# Patient Record
Sex: Male | Born: 1945 | Race: White | Hispanic: No | Marital: Married | Smoking: Former smoker
Health system: Southern US, Community
[De-identification: ages and names within clinical notes are randomized; demographics above are authoritative.]

## PROBLEM LIST (undated history)

## (undated) DIAGNOSIS — I809 Phlebitis and thrombophlebitis of unspecified site: Secondary | ICD-10-CM

## (undated) DIAGNOSIS — H25019 Cortical age-related cataract, unspecified eye: Secondary | ICD-10-CM

## (undated) DIAGNOSIS — L409 Psoriasis, unspecified: Secondary | ICD-10-CM

## (undated) DIAGNOSIS — R066 Hiccough: Secondary | ICD-10-CM

## (undated) DIAGNOSIS — C329 Malignant neoplasm of larynx, unspecified: Secondary | ICD-10-CM

## (undated) DIAGNOSIS — K649 Unspecified hemorrhoids: Secondary | ICD-10-CM

## (undated) DIAGNOSIS — K635 Polyp of colon: Secondary | ICD-10-CM

## (undated) DIAGNOSIS — C3411 Malignant neoplasm of upper lobe, right bronchus or lung: Secondary | ICD-10-CM

## (undated) HISTORY — DX: Psoriasis, unspecified: L40.9

## (undated) HISTORY — DX: Cortical age-related cataract, unspecified eye: H25.019

## (undated) HISTORY — DX: Hiccough: R06.6

## (undated) HISTORY — PX: EYE SURGERY: SHX253

## (undated) HISTORY — DX: Phlebitis and thrombophlebitis of unspecified site: I80.9

## (undated) HISTORY — DX: Unspecified hemorrhoids: K64.9

## (undated) HISTORY — PX: HERNIA REPAIR: SHX51

## (undated) HISTORY — DX: Polyp of colon: K63.5

## (undated) HISTORY — DX: Malignant neoplasm of larynx, unspecified: C32.9

---

## 1996-03-30 DIAGNOSIS — I809 Phlebitis and thrombophlebitis of unspecified site: Secondary | ICD-10-CM

## 1996-03-30 HISTORY — DX: Phlebitis and thrombophlebitis of unspecified site: I80.9

## 2008-03-30 HISTORY — PX: THROAT SURGERY: SHX803

## 2008-11-27 DIAGNOSIS — C329 Malignant neoplasm of larynx, unspecified: Secondary | ICD-10-CM

## 2008-11-27 HISTORY — DX: Malignant neoplasm of larynx, unspecified: C32.9

## 2008-11-28 ENCOUNTER — Ambulatory Visit: Payer: Self-pay | Admitting: Radiation Oncology

## 2008-11-29 ENCOUNTER — Ambulatory Visit: Payer: Self-pay | Admitting: Otolaryngology

## 2008-12-07 ENCOUNTER — Ambulatory Visit: Payer: Self-pay | Admitting: Radiation Oncology

## 2008-12-11 ENCOUNTER — Ambulatory Visit: Payer: Self-pay | Admitting: Radiation Oncology

## 2008-12-11 IMAGING — PT NM PET TUM IMG INITIAL (PI) SKULL BASE T - THIGH
1 of 5 series · 1 of 25 positions shown · non-contrast
Comparison: none

REASON FOR EXAM: vocal cord CA larynx
COMMENTS:

PROCEDURE:     PET - PET/CT INIT STAG HEAD/NECK CA  - [DATE]  [DATE]
RESULT:
HISTORY: Head and neck cancer.

[Series 3: ct wb 3.0 b30f · axial · 3.0mm · 0.98mm/px · 1 of 494 slices shown]
[im 439/494  brain]
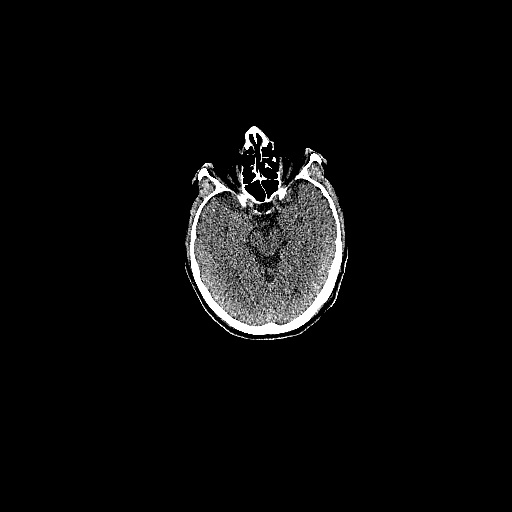

[1 of 25 positions shown; findings below may reference images not displayed]

PROCEDURE AND FINDINGS:  Following determination of fasting blood sugar of
93 mg/dl, 13.3 mCi of F18-FDG was administered to the patient and PET/ CT
obtained with evaluation of the head, neck and body.

No definite focal abnormality identified. Although subtle para-laryngeal
activity is noted, this is nonspecific and no definite PET positive lesions
are noted. No evidence of widespread or metastatic disease noted. It should
be noted that there is mild soft tissue swelling in the laryngeal region
with soft tissue prominence in the anterior commissure. Reference is made to
laryngoscopic evaluation.
IMPRESSION: No definite PET positive lesions are noted.

## 2008-12-11 IMAGING — CT NM PET TUM IMG INITIAL (PI) SKULL BASE T - THIGH
1 of 5 series · 2 of 25 positions shown · non-contrast
Comparison: none

REASON FOR EXAM: vocal cord CA larynx
COMMENTS:

PROCEDURE:     PET - PET/CT INIT STAG HEAD/NECK CA  - [DATE]  [DATE]
RESULT:
HISTORY: Head and neck cancer.

[Series 3: ct headneck 2.0 b31s · axial · 2.0mm · 0.98mm/px · z∈[+81,+117]mm · 2 of 188 slices shown]
[im 117/188  brain]
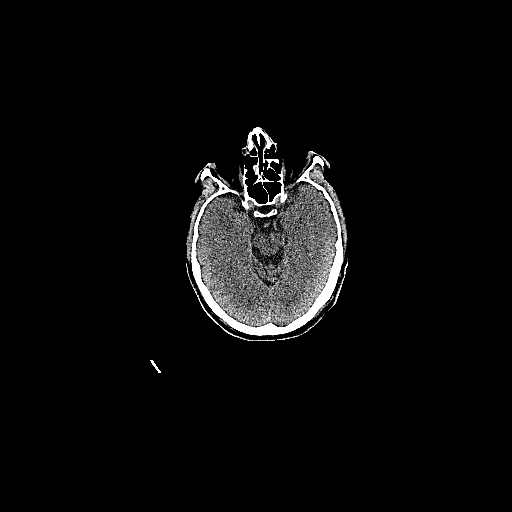
[im 141/188  brain]
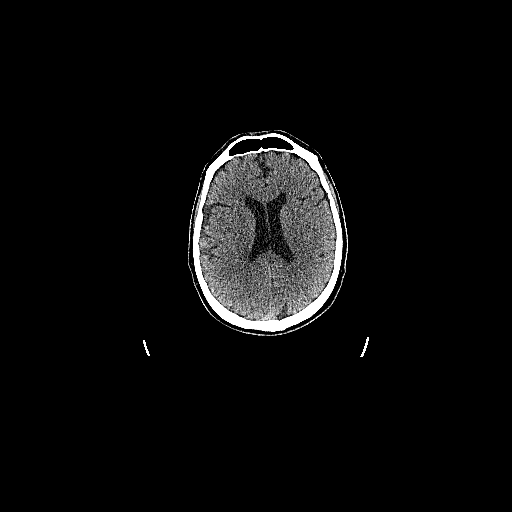

[2 of 25 positions shown; findings below may reference images not displayed]

PROCEDURE AND FINDINGS:  Following determination of fasting blood sugar of
93 mg/dl, 13.3 mCi of F18-FDG was administered to the patient and PET/ CT
obtained with evaluation of the head, neck and body.

No definite focal abnormality identified. Although subtle para-laryngeal
activity is noted, this is nonspecific and no definite PET positive lesions
are noted. No evidence of widespread or metastatic disease noted. It should
be noted that there is mild soft tissue swelling in the laryngeal region
with soft tissue prominence in the anterior commissure. Reference is made to
laryngoscopic evaluation.
IMPRESSION: No definite PET positive lesions are noted.

## 2008-12-13 IMAGING — CT CT GUIDANCE PLACEMENT RAD THERAPY FIELDS
1 series · 16 of 32 positions shown, 20 images · non-contrast
Comparison: none

[Series 2: tx planning · axial · 0.98mm/px · z∈[-150,+152]mm · 16 of 134 slices shown, 20 images]
[im 9/134  soft-tissue]
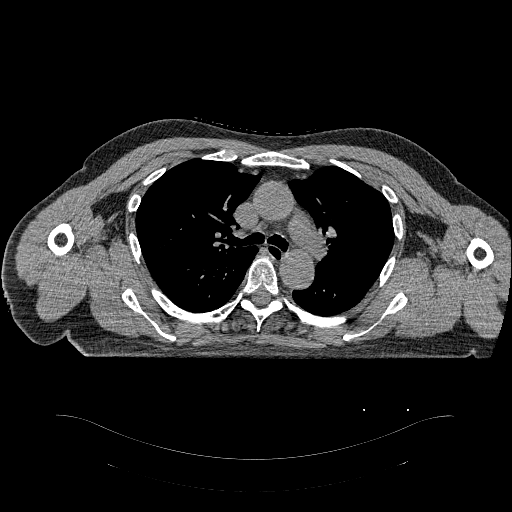
[im 9/134  bone]
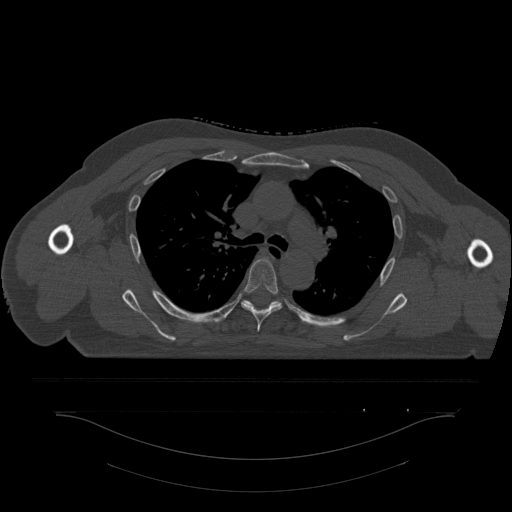
[im 18/134  soft-tissue]
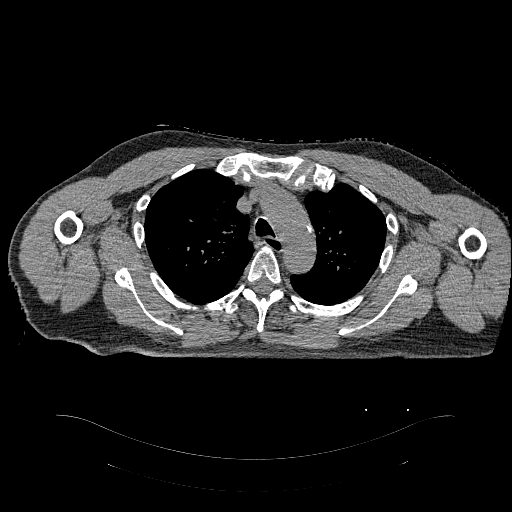
[im 26/134  soft-tissue]
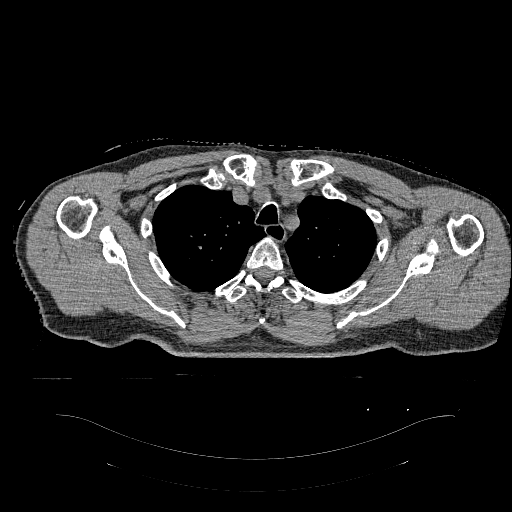
[im 35/134  soft-tissue]
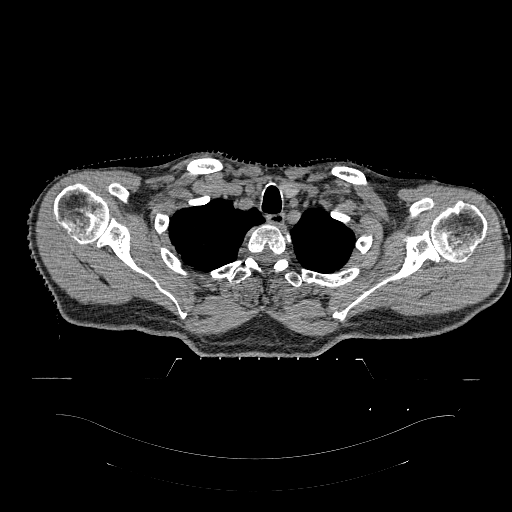
[im 43/134  soft-tissue]
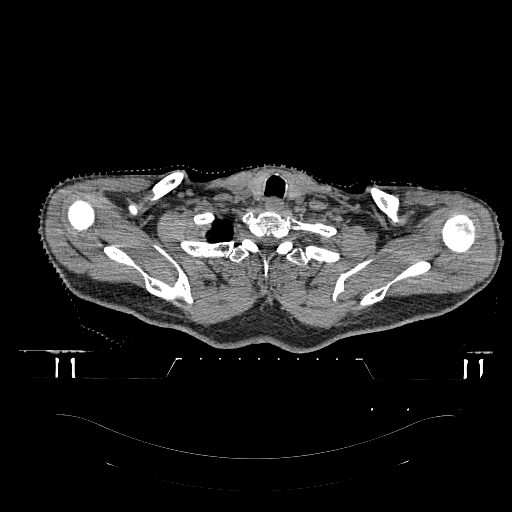
[im 52/134  soft-tissue]
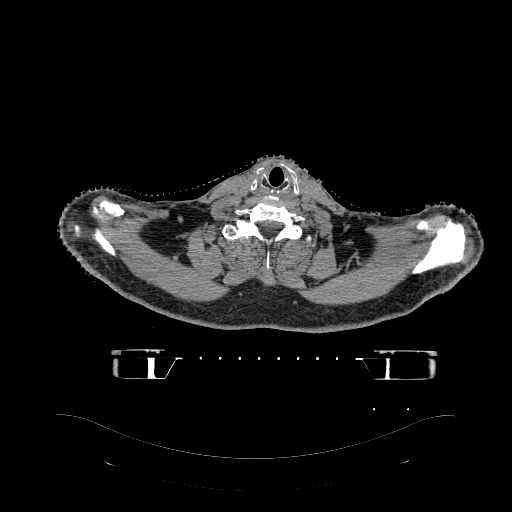
[im 61/134  soft-tissue]
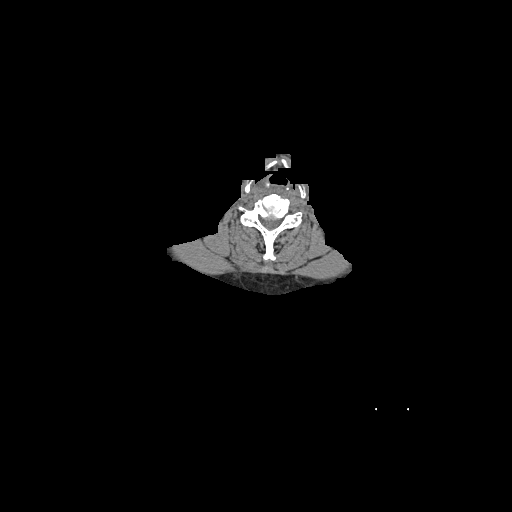
[im 73/134  soft-tissue]
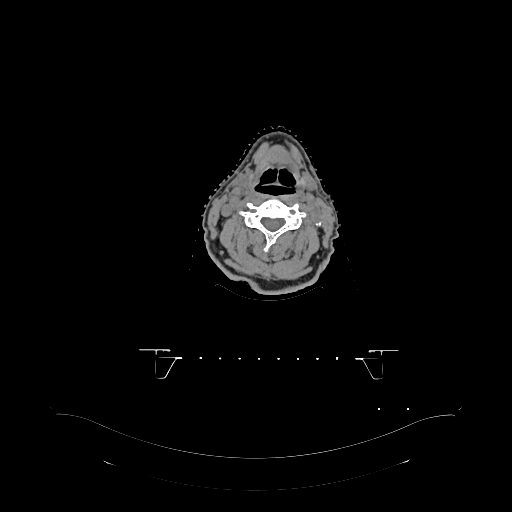
[im 82/134  soft-tissue]
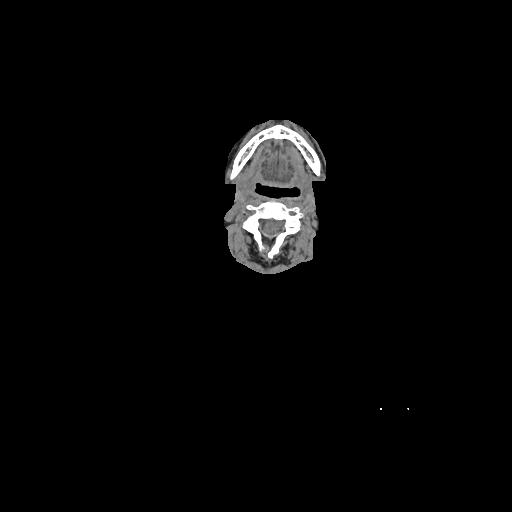
[im 82/134  bone]
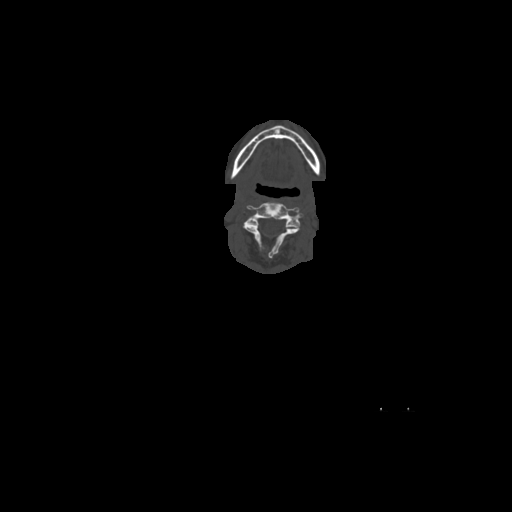
[im 91/134  soft-tissue]
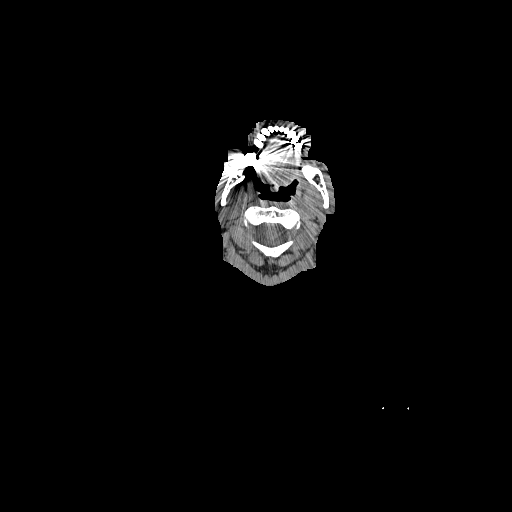
[im 99/134  soft-tissue]
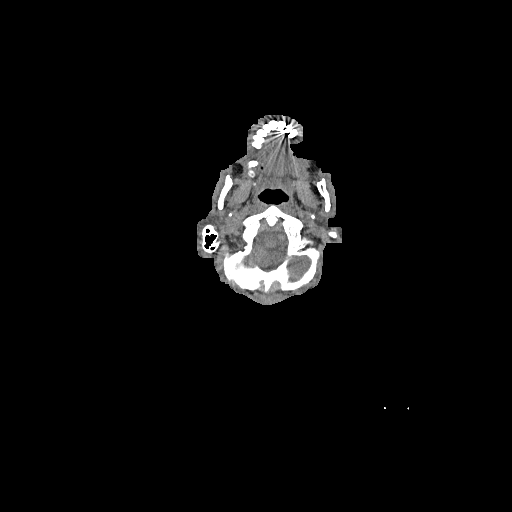
[im 108/134  soft-tissue]
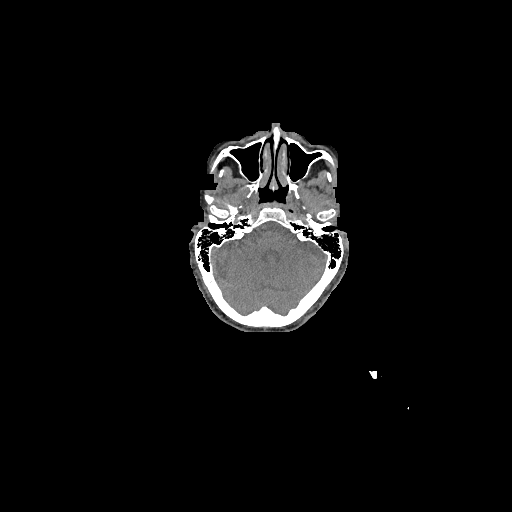
[im 116/134  soft-tissue]
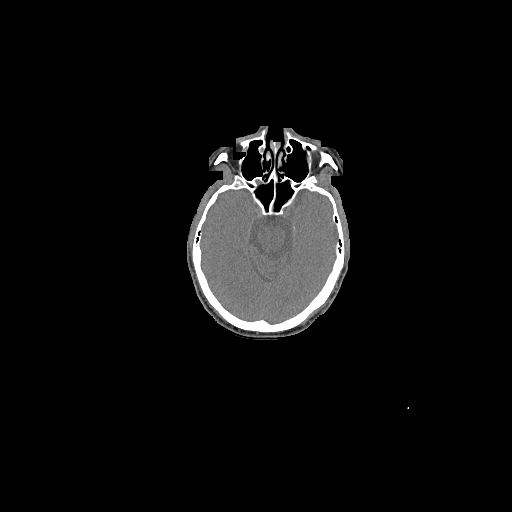
[im 116/134  lung]
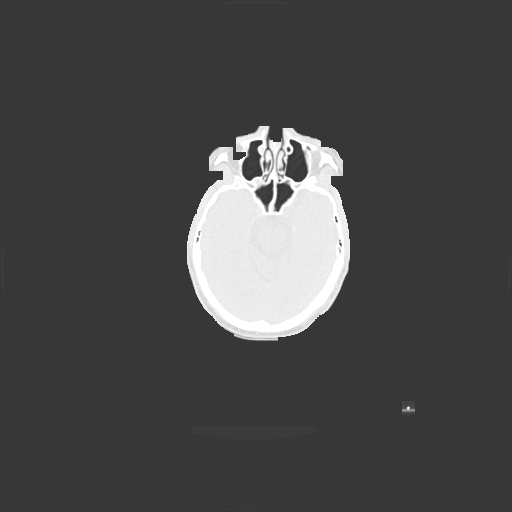
[im 121/134  lung]
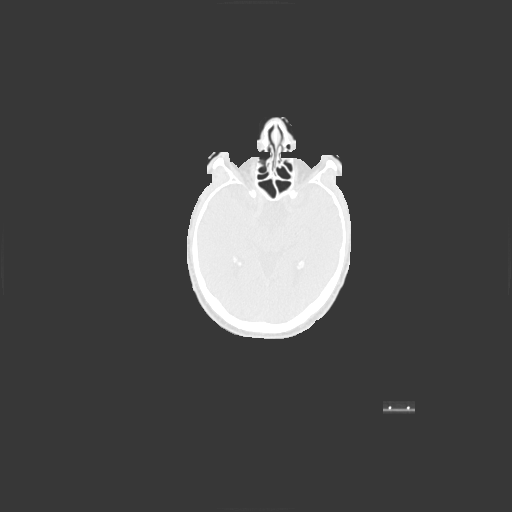
[im 125/134  soft-tissue]
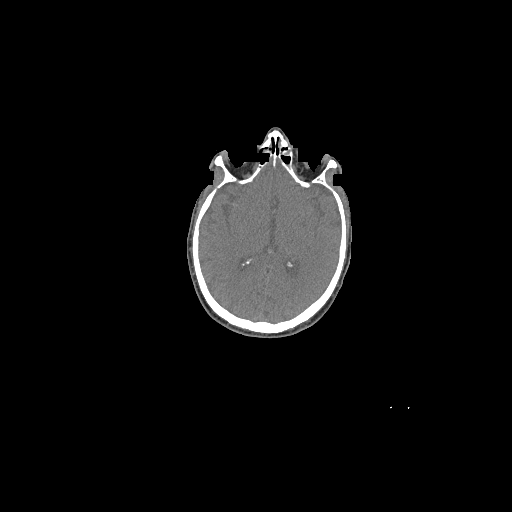
[im 125/134  lung]
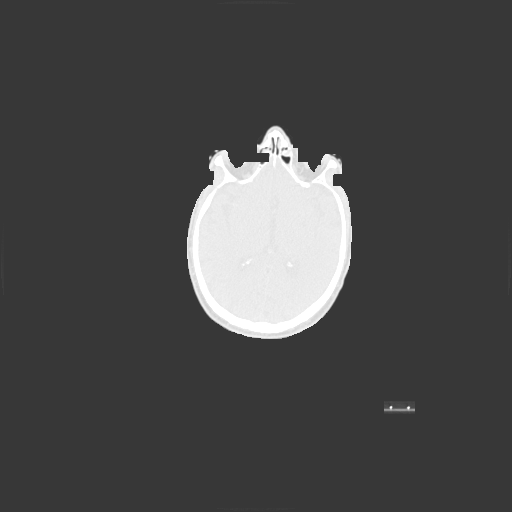
[im 129/134  lung]
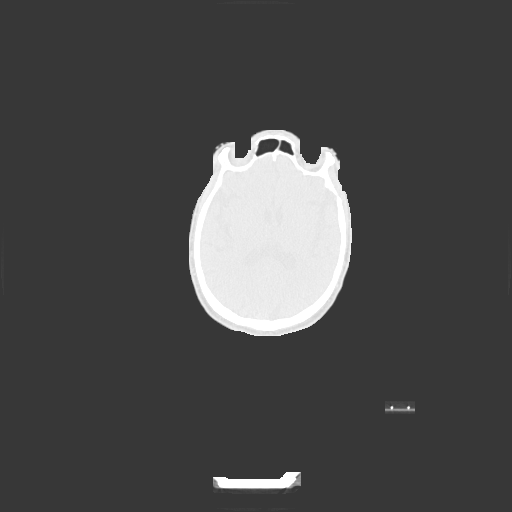

[16 of 32 positions shown; findings below may reference images not displayed]

IMAGES IMPORTED FROM THE [employer] WORKFLOW SYSTEM
NO DICTATION FOR STUDY

## 2008-12-28 ENCOUNTER — Ambulatory Visit: Payer: Self-pay | Admitting: Radiation Oncology

## 2009-01-28 ENCOUNTER — Ambulatory Visit: Payer: Self-pay | Admitting: Radiation Oncology

## 2009-02-27 ENCOUNTER — Ambulatory Visit: Payer: Self-pay | Admitting: Radiation Oncology

## 2009-03-30 ENCOUNTER — Ambulatory Visit: Payer: Self-pay | Admitting: Radiation Oncology

## 2009-07-28 ENCOUNTER — Ambulatory Visit: Payer: Self-pay | Admitting: Radiation Oncology

## 2009-08-14 ENCOUNTER — Ambulatory Visit: Payer: Self-pay | Admitting: Radiation Oncology

## 2009-08-28 ENCOUNTER — Ambulatory Visit: Payer: Self-pay | Admitting: Radiation Oncology

## 2010-08-13 ENCOUNTER — Ambulatory Visit: Payer: Self-pay | Admitting: Radiation Oncology

## 2010-08-29 ENCOUNTER — Ambulatory Visit: Payer: Self-pay | Admitting: Radiation Oncology

## 2011-03-31 DIAGNOSIS — K635 Polyp of colon: Secondary | ICD-10-CM

## 2011-03-31 HISTORY — PX: COLONOSCOPY: SHX174

## 2011-03-31 HISTORY — DX: Polyp of colon: K63.5

## 2011-04-10 ENCOUNTER — Ambulatory Visit: Payer: Self-pay | Admitting: Unknown Physician Specialty

## 2015-10-22 DIAGNOSIS — Z0000 Encounter for general adult medical examination without abnormal findings: Secondary | ICD-10-CM | POA: Diagnosis not present

## 2015-10-22 DIAGNOSIS — Z125 Encounter for screening for malignant neoplasm of prostate: Secondary | ICD-10-CM | POA: Diagnosis not present

## 2015-10-29 DIAGNOSIS — R030 Elevated blood-pressure reading, without diagnosis of hypertension: Secondary | ICD-10-CM | POA: Diagnosis not present

## 2015-10-29 DIAGNOSIS — Z0000 Encounter for general adult medical examination without abnormal findings: Secondary | ICD-10-CM | POA: Diagnosis not present

## 2015-12-03 DIAGNOSIS — J9811 Atelectasis: Secondary | ICD-10-CM | POA: Diagnosis not present

## 2016-01-24 DIAGNOSIS — D485 Neoplasm of uncertain behavior of skin: Secondary | ICD-10-CM | POA: Diagnosis not present

## 2016-01-24 DIAGNOSIS — C44519 Basal cell carcinoma of skin of other part of trunk: Secondary | ICD-10-CM | POA: Diagnosis not present

## 2016-01-24 DIAGNOSIS — C44319 Basal cell carcinoma of skin of other parts of face: Secondary | ICD-10-CM | POA: Diagnosis not present

## 2016-01-24 DIAGNOSIS — X32XXXA Exposure to sunlight, initial encounter: Secondary | ICD-10-CM | POA: Diagnosis not present

## 2016-01-24 DIAGNOSIS — L300 Nummular dermatitis: Secondary | ICD-10-CM | POA: Diagnosis not present

## 2016-01-24 DIAGNOSIS — L821 Other seborrheic keratosis: Secondary | ICD-10-CM | POA: Diagnosis not present

## 2016-01-24 DIAGNOSIS — Z85828 Personal history of other malignant neoplasm of skin: Secondary | ICD-10-CM | POA: Diagnosis not present

## 2016-01-24 DIAGNOSIS — Z08 Encounter for follow-up examination after completed treatment for malignant neoplasm: Secondary | ICD-10-CM | POA: Diagnosis not present

## 2016-01-24 DIAGNOSIS — C44629 Squamous cell carcinoma of skin of left upper limb, including shoulder: Secondary | ICD-10-CM | POA: Diagnosis not present

## 2016-01-24 DIAGNOSIS — L570 Actinic keratosis: Secondary | ICD-10-CM | POA: Diagnosis not present

## 2016-02-07 DIAGNOSIS — Z23 Encounter for immunization: Secondary | ICD-10-CM | POA: Diagnosis not present

## 2016-03-11 DIAGNOSIS — C44319 Basal cell carcinoma of skin of other parts of face: Secondary | ICD-10-CM | POA: Diagnosis not present

## 2016-03-11 DIAGNOSIS — L905 Scar conditions and fibrosis of skin: Secondary | ICD-10-CM | POA: Diagnosis not present

## 2016-03-18 DIAGNOSIS — D0462 Carcinoma in situ of skin of left upper limb, including shoulder: Secondary | ICD-10-CM | POA: Diagnosis not present

## 2016-03-18 DIAGNOSIS — L905 Scar conditions and fibrosis of skin: Secondary | ICD-10-CM | POA: Diagnosis not present

## 2016-03-18 DIAGNOSIS — C44629 Squamous cell carcinoma of skin of left upper limb, including shoulder: Secondary | ICD-10-CM | POA: Diagnosis not present

## 2016-04-01 DIAGNOSIS — C44519 Basal cell carcinoma of skin of other part of trunk: Secondary | ICD-10-CM | POA: Diagnosis not present

## 2016-06-30 DIAGNOSIS — L570 Actinic keratosis: Secondary | ICD-10-CM | POA: Diagnosis not present

## 2016-06-30 DIAGNOSIS — X32XXXA Exposure to sunlight, initial encounter: Secondary | ICD-10-CM | POA: Diagnosis not present

## 2016-06-30 DIAGNOSIS — D2262 Melanocytic nevi of left upper limb, including shoulder: Secondary | ICD-10-CM | POA: Diagnosis not present

## 2016-06-30 DIAGNOSIS — D2261 Melanocytic nevi of right upper limb, including shoulder: Secondary | ICD-10-CM | POA: Diagnosis not present

## 2016-06-30 DIAGNOSIS — D225 Melanocytic nevi of trunk: Secondary | ICD-10-CM | POA: Diagnosis not present

## 2016-06-30 DIAGNOSIS — Z85828 Personal history of other malignant neoplasm of skin: Secondary | ICD-10-CM | POA: Diagnosis not present

## 2016-10-08 DIAGNOSIS — H1852 Epithelial (juvenile) corneal dystrophy: Secondary | ICD-10-CM | POA: Diagnosis not present

## 2016-10-08 DIAGNOSIS — Z9842 Cataract extraction status, left eye: Secondary | ICD-10-CM | POA: Diagnosis not present

## 2016-10-08 DIAGNOSIS — Z9841 Cataract extraction status, right eye: Secondary | ICD-10-CM | POA: Diagnosis not present

## 2016-10-08 DIAGNOSIS — H52223 Regular astigmatism, bilateral: Secondary | ICD-10-CM | POA: Diagnosis not present

## 2016-10-28 DIAGNOSIS — Z0000 Encounter for general adult medical examination without abnormal findings: Secondary | ICD-10-CM | POA: Diagnosis not present

## 2016-10-28 DIAGNOSIS — Z125 Encounter for screening for malignant neoplasm of prostate: Secondary | ICD-10-CM | POA: Diagnosis not present

## 2016-11-04 ENCOUNTER — Encounter: Payer: Self-pay | Admitting: *Deleted

## 2016-11-04 DIAGNOSIS — Z8521 Personal history of malignant neoplasm of larynx: Secondary | ICD-10-CM | POA: Diagnosis not present

## 2016-11-04 DIAGNOSIS — R918 Other nonspecific abnormal finding of lung field: Secondary | ICD-10-CM | POA: Diagnosis not present

## 2016-11-04 DIAGNOSIS — Z0000 Encounter for general adult medical examination without abnormal findings: Secondary | ICD-10-CM | POA: Diagnosis not present

## 2016-11-04 DIAGNOSIS — R030 Elevated blood-pressure reading, without diagnosis of hypertension: Secondary | ICD-10-CM | POA: Diagnosis not present

## 2016-11-04 DIAGNOSIS — Z23 Encounter for immunization: Secondary | ICD-10-CM | POA: Diagnosis not present

## 2016-11-04 DIAGNOSIS — K219 Gastro-esophageal reflux disease without esophagitis: Secondary | ICD-10-CM | POA: Diagnosis not present

## 2016-11-05 ENCOUNTER — Other Ambulatory Visit: Payer: Self-pay | Admitting: Family Medicine

## 2016-11-05 DIAGNOSIS — R918 Other nonspecific abnormal finding of lung field: Secondary | ICD-10-CM

## 2016-11-10 ENCOUNTER — Ambulatory Visit
Admission: RE | Admit: 2016-11-10 | Discharge: 2016-11-10 | Disposition: A | Discharge status: Home / Self Care | Payer: PPO | Source: Ambulatory Visit | Attending: Family Medicine | Admitting: Family Medicine

## 2016-11-10 DIAGNOSIS — I2510 Atherosclerotic heart disease of native coronary artery without angina pectoris: Secondary | ICD-10-CM | POA: Diagnosis not present

## 2016-11-10 DIAGNOSIS — I700 Atherosclerosis of aorta: Secondary | ICD-10-CM | POA: Diagnosis not present

## 2016-11-10 DIAGNOSIS — E278 Other specified disorders of adrenal gland: Secondary | ICD-10-CM | POA: Insufficient documentation

## 2016-11-10 DIAGNOSIS — R918 Other nonspecific abnormal finding of lung field: Secondary | ICD-10-CM | POA: Insufficient documentation

## 2016-11-10 IMAGING — CT CT CHEST W/O CM
1 series · 14 of 34 positions shown, 18 images · non-contrast
Comparison: [DATE].

CLINICAL DATA: Pulmonary nodule.  History of laryngeal carcinoma

EXAM:
CT CHEST WITHOUT CONTRAST
TECHNIQUE: Multidetector CT imaging of the chest was performed following the
standard protocol without IV contrast.

[Series 2: thorax · axial · 0.80mm/px · z∈[-741,-461]mm · 14 of 166 slices shown, 18 images]
[im 13/166  mediastinal]
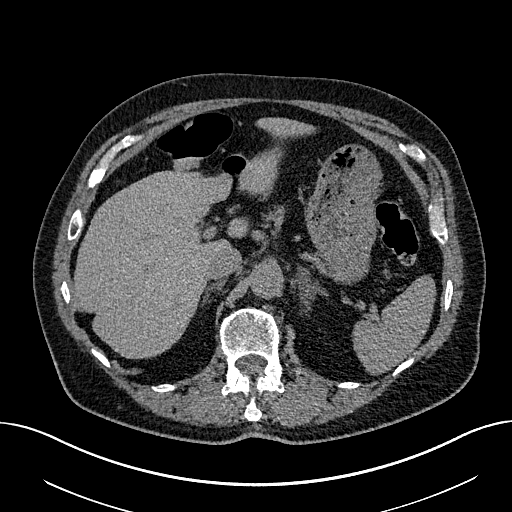
[im 13/166  lung]
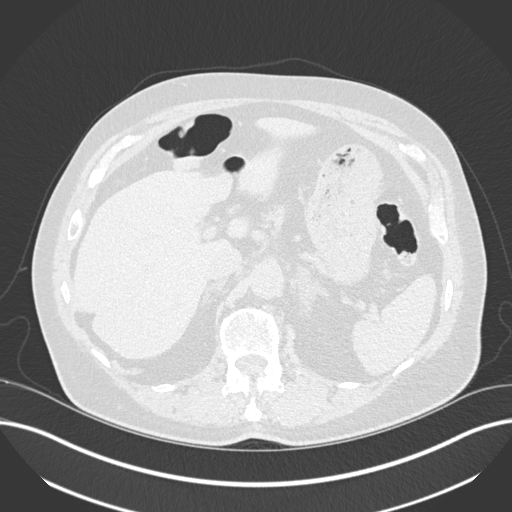
[im 25/166  lung]
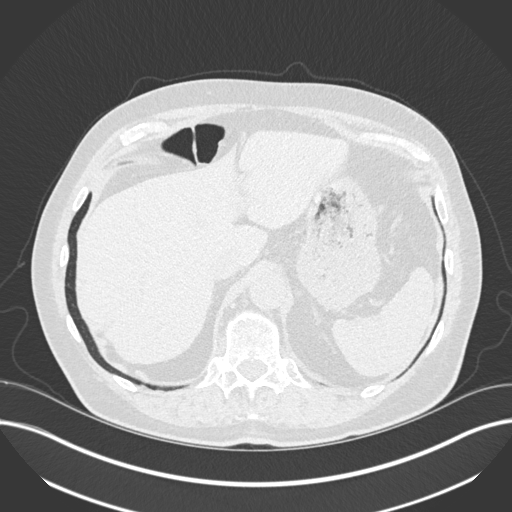
[im 34/166  lung]
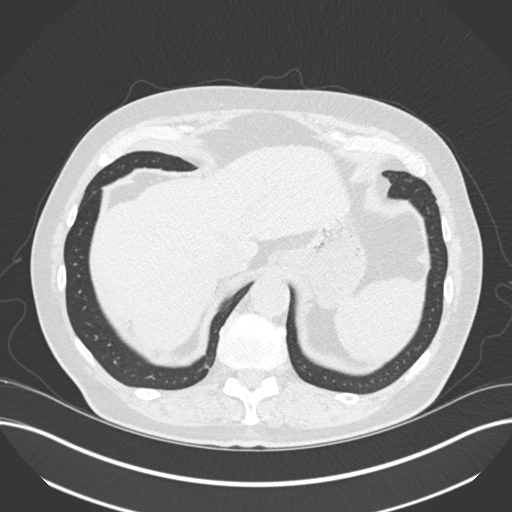
[im 49/166  lung]
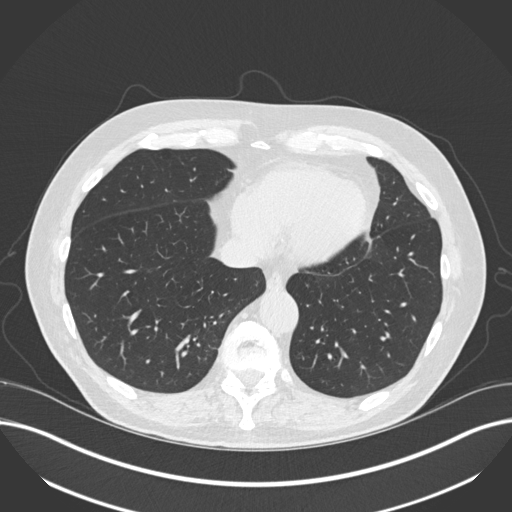
[im 62/166  mediastinal]
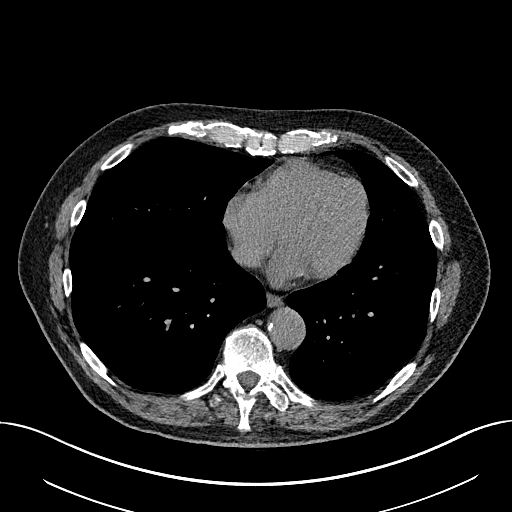
[im 62/166  lung]
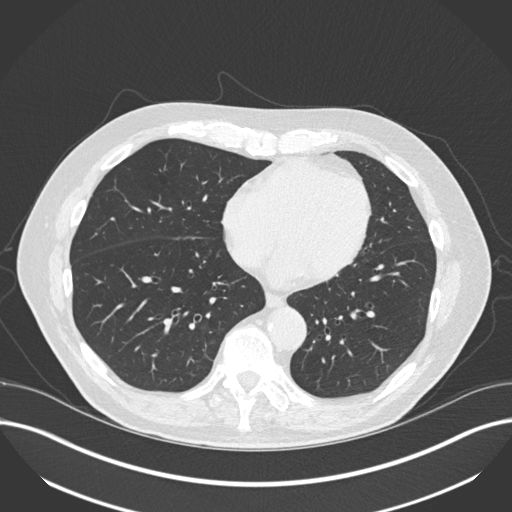
[im 68/166  lung]
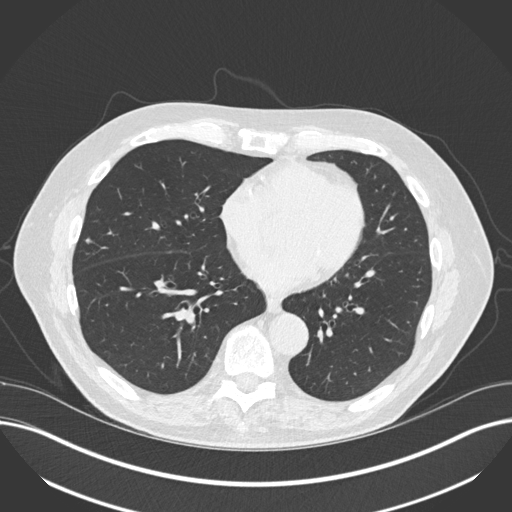
[im 79/166  lung]
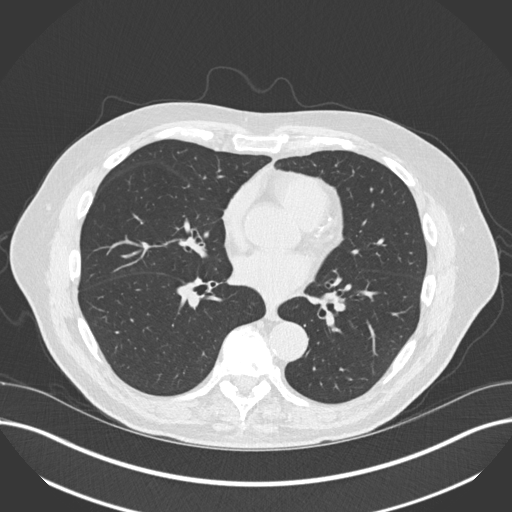
[im 88/166  lung]
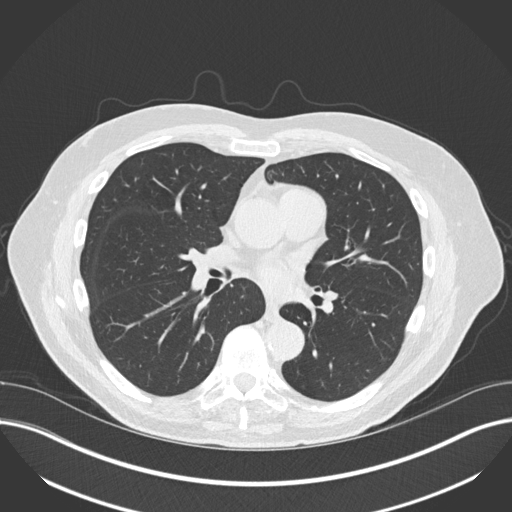
[im 98/166  mediastinal]
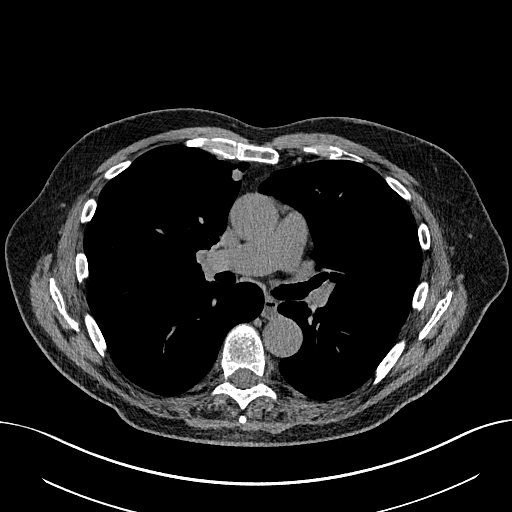
[im 98/166  lung]
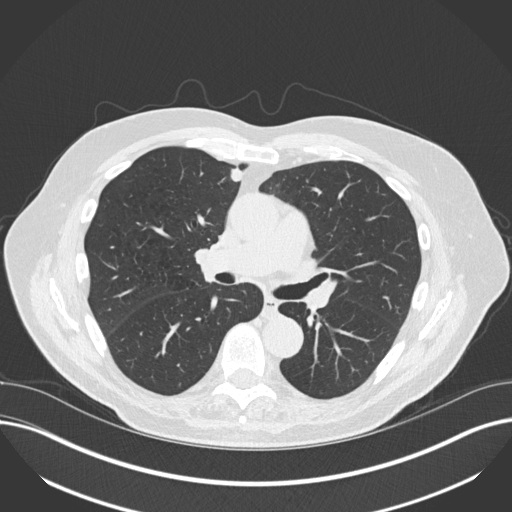
[im 104/166  lung]
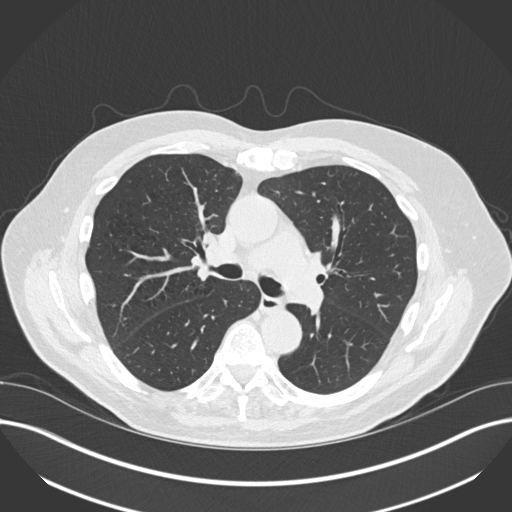
[im 123/166  lung]
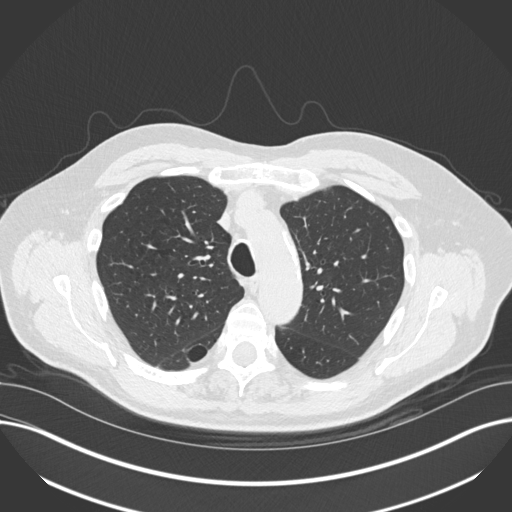
[im 133/166  lung]
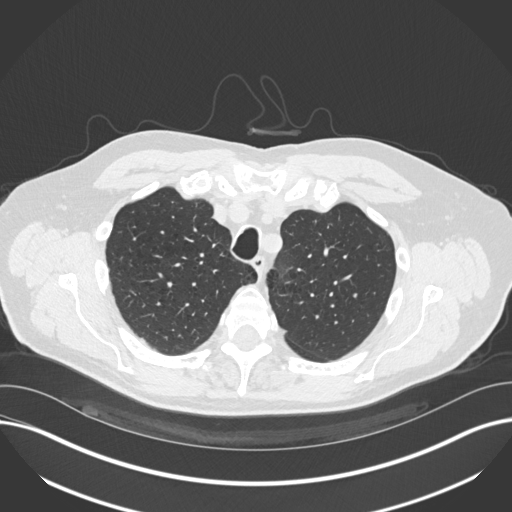
[im 141/166  mediastinal]
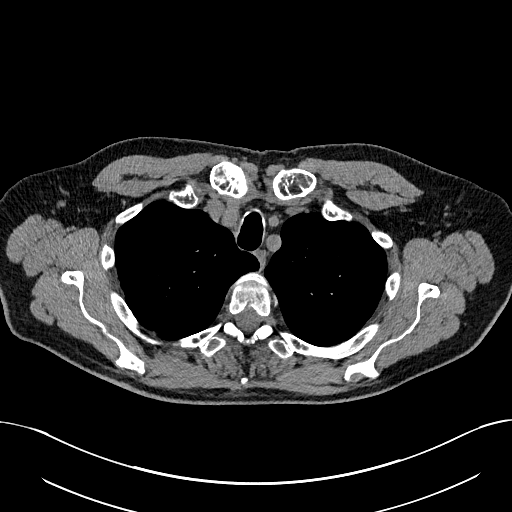
[im 141/166  lung]
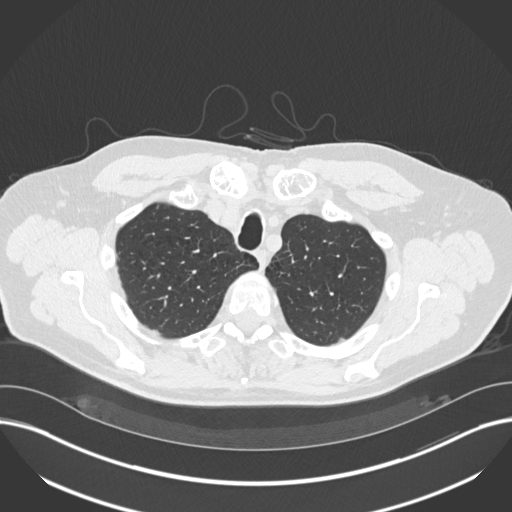
[im 153/166  lung]
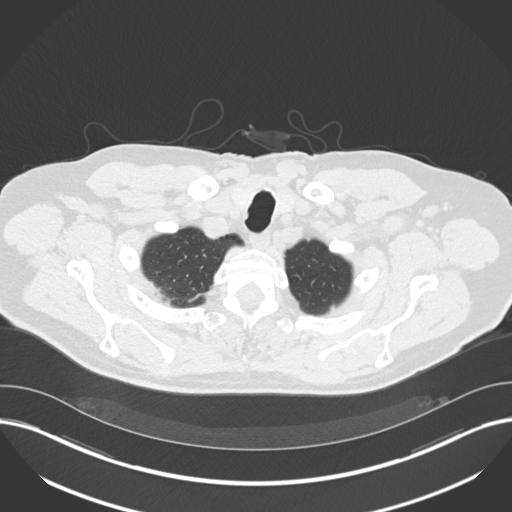

[14 of 34 positions shown; findings below may reference images not displayed]

FINDINGS: Cardiovascular: There is no thoracic aortic aneurysm. There are foci
of calcification in the proximal left subclavian artery. Other
visualized great vessels appear normal. There is atherosclerotic
calcification in the aorta. There are several foci of coronary
artery calcification. Pericardium is not appreciably thickened.

Mediastinum/Nodes: Thyroid appears unremarkable. There is no evident
thoracic adenopathy. Esophagus appears unremarkable.

Lungs/Pleura: There is a small bulla in the superior segment right
lower lobe. On axial slice 69 series 3, there is a 1.4 x 1.2 x
cm nodular opacity in the medial aspect of the anterior segment of
the right upper lobe adjacent to the anterior mediastinal fat. This
nodular opacity is also seen on coronal slice 34 series 5 and on
sagittal slice 84 series 6. Its borders are slightly irregular. On
axial slice 84 series 3, there is a 2 mm nodular opacity in the
anterior segment of the left upper lobe. On axial slice 108 series
3, there is a 3 mm nodular opacity in the medial segment of the
right middle lobe. On axial slice 99 series 3, there is a 3 mm
nodular opacity in the lateral segment right middle lobe. There is
no appreciable left perihilar nodular opacity appreciable by CT.
There is no edema or consolidation. No pleural effusion or pleural
thickening evident. There is slight scarring in the right lower
lobe.

Upper Abdomen: There is a 1 x 1 cm probable cyst in the anterior
segment of the right lobe of the liver. There is hypertrophy of the
left adrenal. There is atherosclerotic calcification in the aorta.

Musculoskeletal: There is degenerative change in the thoracic spine.
There are no blastic or lytic bone lesions.
IMPRESSION: 1. There is an irregular nodular opacity in the medial aspect of the
anterior segment right upper lobe measuring 1.4 x 1.2 x 0.9 cm with
slightly irregular borders. A small neoplasm must be of concern,
either a primary lung carcinoma or metastatic focus from known
laryngeal carcinoma. This finding may well warrant a PET-CT
examination to further assess.

2.  Several scattered 2-3 mm nodular opacities present.

3.  No edema or consolidation.

4.  No evident adenopathy.

5. Left adrenal hypertrophy. It is difficult to exclude underlying
mass within the left adrenal. Particular attention to this area on
PET study would be warranted.

6. Aortic and left subclavian artery atherosclerosis. Foci of
coronary artery calcification noted.

Aortic Atherosclerosis ([8C]-[8C]).

These results will be called to the ordering clinician or
representative by the Radiologist Assistant, and communication
documented in the PACS or zVision Dashboard.

## 2016-11-12 ENCOUNTER — Other Ambulatory Visit: Payer: Self-pay | Admitting: Family Medicine

## 2016-11-12 DIAGNOSIS — R9389 Abnormal findings on diagnostic imaging of other specified body structures: Secondary | ICD-10-CM

## 2016-11-20 ENCOUNTER — Encounter
Admission: RE | Admit: 2016-11-20 | Discharge: 2016-11-20 | Disposition: A | Discharge status: Home / Self Care | Payer: PPO | Source: Ambulatory Visit | Attending: Family Medicine | Admitting: Family Medicine

## 2016-11-24 ENCOUNTER — Encounter
Admission: RE | Admit: 2016-11-24 | Discharge: 2016-11-24 | Disposition: A | Discharge status: Home / Self Care | Payer: PPO | Source: Ambulatory Visit | Attending: Family Medicine | Admitting: Family Medicine

## 2016-11-24 DIAGNOSIS — R938 Abnormal findings on diagnostic imaging of other specified body structures: Secondary | ICD-10-CM | POA: Insufficient documentation

## 2016-11-24 DIAGNOSIS — R911 Solitary pulmonary nodule: Secondary | ICD-10-CM | POA: Diagnosis not present

## 2016-11-24 DIAGNOSIS — R9389 Abnormal findings on diagnostic imaging of other specified body structures: Secondary | ICD-10-CM

## 2016-11-24 LAB — GLUCOSE, CAPILLARY: Glucose-Capillary: 96 mg/dL (ref 65–99)

## 2016-11-24 IMAGING — CT NM PET TUM IMG RESTAG (PS) SKULL BASE T - THIGH
1 of 10 series · 1 of 25 positions shown · non-contrast
Comparison: [DATE] and prior PET-CT from [DATE]

CLINICAL DATA: Initial treatment strategy for right upper lobe
pulmonary nodule. History of laryngeal cancer.

EXAM:
NUCLEAR MEDICINE PET SKULL BASE TO THIGH
TECHNIQUE: 13.1 mCi F-18 FDG was injected intravenously. Full-ring PET imaging
was performed from the skull base to thigh after the radiotracer. CT
data was obtained and used for attenuation correction and anatomic
localization.
FASTING BLOOD GLUCOSE:  Value: 96 mg/dl

[Series 3: ct wb 5.0 b30f · axial · 5.0mm · 0.98mm/px · 1 of 329 slices shown]
[im 329/329  brain]
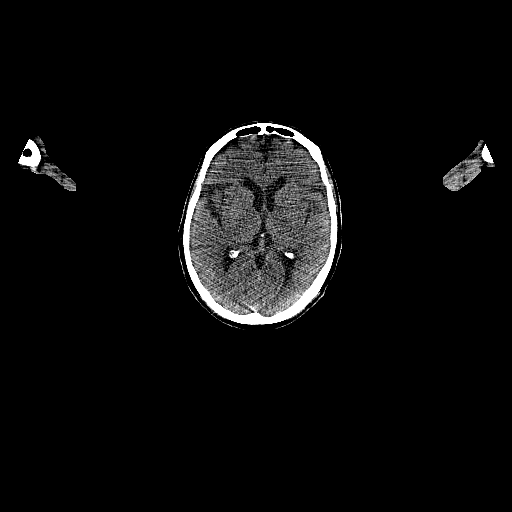

[1 of 25 positions shown; findings below may reference images not displayed]

FINDINGS: NECK

No hypermetabolic lymph nodes in the neck. No current findings of
recurrent laryngeal carcinoma.

Chronic left sphenoid sinusitis.

CHEST

The 1.1 cm right upper lobe pulmonary nodule has a maximum standard
uptake value of 1.2. Because of some misregistration of thoracic
structures which could conceivably thorough off attenuation
correction, I have also scrutinized this region on the non
attenuation corrected images, were there is only very faintly
accentuated activity the.

Centrilobular emphysema. 0.9 cm AP window lymph node is not
metabolic beyond background blood pool activity. No overt pathologic
thoracic adenopathy.

Coronary, aortic arch, and branch vessel atherosclerotic vascular
disease.

ABDOMEN/PELVIS

No abnormal hypermetabolic activity within the liver, pancreas,
adrenal glands, or spleen. No hypermetabolic lymph nodes in the
abdomen or pelvis. [DATE] by 2.0 cm left adrenal mass with internal
density 7 Hounsfield units, compatible with adrenal adenoma.

Dependent density in the gallbladder compatible with small
gallstones.

Aortoiliac atherosclerotic vascular disease. Fusiform infrarenal
abdominal aortic aneurysm at 3.0 cm anterior-posterior.

SKELETON

No focal hypermetabolic activity to suggest skeletal metastasis.
IMPRESSION: 1. The right upper lobe nodule has a maximum SUV of only 1.2.
Possibilities include benign process and low-grade adenocarcinoma.
This would be a tricky location for percutaneous biopsy and is
adjacent to the pleural margin. Possibilities might include wedge
resection or surveillance.
2. Aortic Atherosclerosis ([XI]-[XI]) and Emphysema ([XI]-[XI]).
Coronary atherosclerosis.
3. 3.0 cm infrarenal abdominal aortic aneurysm. Recommend followup
by ultrasound in 3 years. This recommendation follows ACR consensus
guidelines: White Paper of the ACR Incidental Findings Committee II
on Vascular Findings. [HOSPITAL] [XI]; [DATE]
4. Other imaging findings of potential clinical significance:
Cholelithiasis. Left adrenal adenoma, benign. Chronic left sphenoid
sinusitis. No findings of recurrent laryngeal carcinoma.

## 2016-11-24 MED ORDER — FLUDEOXYGLUCOSE F - 18 (FDG) INJECTION
13.1100 | Freq: Once | INTRAVENOUS | Status: AC | PRN
  Administered 2016-11-24: 13.11 via INTRAVENOUS

## 2016-11-25 ENCOUNTER — Ambulatory Visit (INDEPENDENT_AMBULATORY_CARE_PROVIDER_SITE_OTHER): Payer: PPO | Admitting: General Surgery

## 2016-11-25 ENCOUNTER — Encounter: Payer: Self-pay | Admitting: General Surgery

## 2016-11-25 VITALS — BP 142/82 | HR 74 | Resp 12 | Ht 71.0 in | Wt 208.0 lb

## 2016-11-25 DIAGNOSIS — Z86010 Personal history of colonic polyps: Secondary | ICD-10-CM | POA: Diagnosis not present

## 2016-11-25 MED ORDER — POLYETHYLENE GLYCOL 3350 17 GM/SCOOP PO POWD: ORAL | 0 refills | Status: DC

## 2016-11-25 NOTE — Progress Notes (Signed)
Patient ID: Curtis Aceituno., male   DOB: 1945-05-07, 71 y.o.   MRN: [id number]  Chief Complaint  Patient presents with  . Colonoscopy    HPI Curtis B Arcadio Cope. is a 71 y.o. male.  Who presents for a colonoscopy discussion. The last colonoscopy was completed in 2013.  Denies any gastrointestinal issues. Bowels move regular and no bleeding noted. He does have occasional oozing of his bowels but it is maybe once a week if he "works out a lot" strenuous activity at [employer].  The patient reports that he had a chest x-ray with his PCP earlier this month and this subsequently resulted in a CT scan and more recently a PET scan. 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  Chest CT 11-11-16 and PET scan was yesterday.  Followed by Dr Tami Ribas for history of laryngeal cancer.  HPI  Past Medical History:  Diagnosis Date  . Cancer ([place]) 2010   throat /squamous cell with radiation  . Colon polyp 2013  . GERD (gastroesophageal reflux disease)     Past Surgical History:  Procedure Laterality Date  . COLONOSCOPY  2013   Dr Vira Agar  . THROAT SURGERY  2010    Family History  Problem Relation Age of Onset  . Alzheimer's disease Mother   . Alzheimer's disease Father   . Stroke Father   . Colon cancer Neg Hx     Social History Social History  Substance Use Topics  . Smoking status: Former Smoker    Years: 50.00    Quit date: 03/31/2007  . Smokeless tobacco: Never Used  . Alcohol use No    No Known Allergies  Current Outpatient Prescriptions  Medication Sig Dispense Refill  . Ascorbic Acid (VITAMIN C) 1000 MG tablet Take 1,000 mg by mouth daily.    . ASHWAGANDHA PO Take by mouth daily.    Marland Kitchen aspirin EC 81 MG tablet Take 81 mg by mouth daily.     . folic acid (FOLVITE) 557 MCG tablet Take 800 mcg by mouth daily.    Marland Kitchen GLUCOSAMINE SULFATE PO Take by mouth daily.     . Multiple Vitamin (MULTI-VITAMINS) TABS Take by mouth  daily.     . Omega-3 1000 MG CAPS Take by mouth.    Marland Kitchen omeprazole (PRILOSEC) 40 MG capsule Take 40 mg by mouth daily.     . polyethylene glycol powder (GLYCOLAX/MIRALAX) powder 255 grams one bottle for colonoscopy prep 255 g 0   No current facility-administered medications for this visit.     Review of Systems Review of Systems  Constitutional: Negative.   Respiratory: Negative.   Cardiovascular: Negative.   Gastrointestinal: Negative for anal bleeding, constipation and diarrhea.    Blood pressure (!) 142/82, pulse 74, resp. rate 12, height 5\' 11"  (1.803 m), weight 208 lb (94.3 kg).  Physical Exam Physical Exam  Constitutional: He is oriented to person, place, and time. He appears well-developed and well-nourished.  HENT:  Mouth/Throat: Oropharynx is clear and moist.  Eyes: Conjunctivae are normal. No scleral icterus.  Neck: Neck supple.  Cardiovascular: Normal rate, regular rhythm and normal heart sounds.   Pulmonary/Chest: Effort normal and breath sounds normal.  Abdominal: Soft. There is no tenderness.  Lymphadenopathy:       Head (right side): No submental and no submandibular adenopathy present.       Head (left side): No submental adenopathy present.    He has  no cervical adenopathy.       Right: No supraclavicular adenopathy present.       Left: No supraclavicular adenopathy present.  Neurological: He is alert and oriented to person, place, and time.  Skin: Skin is warm and dry.  Psychiatric: His behavior is normal.    Data Reviewed PCP notes of 11/04/2016 reviewed. Wellness visit.  04/09/2016 colonoscopy biopsy showed a tubular adenoma of the transverse colon. Negative for high-grade dysplasia. Review of the procedure note completed by Gaylyn Cheers, M.D. showed diverticuli in the descending and sigmoid colon with a focal area thought to represent diverticulitis. The polyp was described as being small.  Laboratory studies dated 10/28/2016 showed a hemoglobin of  15.0 with an MCV of 97.8, white blood cell count 5900 with a platelet count of 164,000.  Comprehensive metabolic panel the same date was unremarkable. Creatinine 1.0 with an estimated GFR of 73.  CT scan of 11/11/2016 showed a nodule in the right upper lobe adjacent to the chest wall. Not evident on 2010 PET/CT.  PET/CT: November 24, 2016:  CHEST  The 1.1 cm right upper lobe pulmonary nodule has a maximum standard uptake value of 1.2. Because of some misregistration of thoracic structures which could conceivably thorough off attenuation correction, I have also scrutinized this region on the non attenuation corrected images, were there is only very faintly accentuated activity the. IMPRESSION: 1. The right upper lobe nodule has a maximum SUV of only 1.2. Possibilities include benign process and low-grade adenocarcinoma. This would be a tricky location for percutaneous biopsy and is adjacent to the pleural margin. Possibilities might include wedge resection or surveillance.  Assessment    Asymptomatic patient with history of colonic polyp.  Right upper lobe lesion which may require open biopsy.     Plan    Even with the recent right upper lobe nodule finding, the patient is a candidate for colonoscopy. 8 years out from his original head and neck cancer this would be as likely to be a solitary metastasis from a colon primary as would be from an squamous cell carcinoma from the neck.     Colonoscopy with possible biopsy/polypectomy prn: Information regarding the procedure, including its potential risks and complications (including but not limited to perforation of the bowel, which may require emergency surgery to repair, and bleeding) was verbally given to the patient. Educational information regarding lower intestinal endoscopy was given to the patient. Written instructions for how to complete the bowel prep using Miralax were provided. The importance of drinking ample fluids to avoid  dehydration as a result of the prep emphasized.   HPI, Physical Exam, Assessment and Plan have been scribed under the direction and in the presence of Curtis Bellow, MD. Curtis Fetch, RN  I have completed the exam and reviewed the above documentation for accuracy and completeness.  I agree with the above.  Haematologist has been used and any errors in dictation or transcription are unintentional.  Hervey Ard, M.D., F.A.C.S.  Curtis Andrade 11/26/2016, 9:41 AM  Patient has been scheduled for a colonoscopy on 12-23-16 at [facility]. Miralax prescription has been sent in to the patient's pharmacy today. It is okay for patient to continue an 81 mg aspirin once daily. This patient has been asked to discontinue fish oil one week prior to procedure. Colonoscopy instructions have been reviewed with the patient. This patient is aware to call the office if they have further questions.   Dominga Ferry, CMA

## 2016-11-25 NOTE — Patient Instructions (Signed)
The patient is aware to call back for any questions or concerns.  Colonoscopy, Adult A colonoscopy is an exam to look at the entire large intestine. During the exam, a lubricated, bendable tube is inserted into the anus and then passed into the rectum, colon, and other parts of the large intestine. A colonoscopy is often done as a part of normal colorectal screening or in response to certain symptoms, such as anemia, persistent diarrhea, abdominal pain, and blood in the stool. The exam can help screen for and diagnose medical problems, including:  Tumors.  Polyps.  Inflammation.  Areas of bleeding.  Tell a health care provider about:  Any allergies you have.  All medicines you are taking, including vitamins, herbs, eye drops, creams, and over-the-counter medicines.  Any problems you or family members have had with anesthetic medicines.  Any blood disorders you have.  Any surgeries you have had.  Any medical conditions you have.  Any problems you have had passing stool. What are the risks? Generally, this is a safe procedure. However, problems may occur, including:  Bleeding.  A tear in the intestine.  A reaction to medicines given during the exam.  Infection (rare).  What happens before the procedure? Eating and drinking restrictions Follow instructions from your health care provider about eating and drinking, which may include:  A few days before the procedure - follow a low-fiber diet. Avoid nuts, seeds, dried fruit, raw fruits, and vegetables.  1-3 days before the procedure - follow a clear liquid diet. Drink only clear liquids, such as clear broth or bouillon, black coffee or tea, clear juice, clear soft drinks or sports drinks, gelatin dessert, and popsicles. Avoid any liquids that contain red or purple dye.  On the day of the procedure - do not eat or drink anything during the 2 hours before the procedure, or within the time period that your health care provider  recommends.  Bowel prep If you were prescribed an oral bowel prep to clean out your colon:  Take it as told by your health care provider. Starting the day before your procedure, you will need to drink a large amount of medicated liquid. The liquid will cause you to have multiple loose stools until your stool is almost clear or light green.  If your skin or anus gets irritated from diarrhea, you may use these to relieve the irritation: ? Medicated wipes, such as adult wet wipes with aloe and vitamin E. ? A skin soothing-product like petroleum jelly.  If you vomit while drinking the bowel prep, take a break for up to 60 minutes and then begin the bowel prep again. If vomiting continues and you cannot take the bowel prep without vomiting, call your health care provider.  General instructions  Ask your health care provider about changing or stopping your regular medicines. This is especially important if you are taking diabetes medicines or blood thinners.  Plan to have someone take you home from the hospital or clinic. What happens during the procedure?  An IV tube may be inserted into one of your veins.  You will be given medicine to help you relax (sedative).  To reduce your risk of infection: ? Your health care team will wash or sanitize their hands. ? Your anal area will be washed with soap.  You will be asked to lie on your side with your knees bent.  Your health care provider will lubricate a long, thin, flexible tube. The tube will have a camera and   a light on the end.  The tube will be inserted into your anus.  The tube will be gently eased through your rectum and colon.  Air will be delivered into your colon to keep it open. You may feel some pressure or cramping.  The camera will be used to take images during the procedure.  A small tissue sample may be removed from your body to be examined under a microscope (biopsy). If any potential problems are found, the tissue  will be sent to a lab for testing.  If small polyps are found, your health care provider may remove them and have them checked for cancer cells.  The tube that was inserted into your anus will be slowly removed. The procedure may vary among health care providers and hospitals. What happens after the procedure?  Your blood pressure, heart rate, breathing rate, and blood oxygen level will be monitored until the medicines you were given have worn off.  Do not drive for 24 hours after the exam.  You may have a small amount of blood in your stool.  You may pass gas and have mild abdominal cramping or bloating due to the air that was used to inflate your colon during the exam.  It is up to you to get the results of your procedure. Ask your health care provider, or the department performing the procedure, when your results will be ready. This information is not intended to replace advice given to you by your health care provider. Make sure you discuss any questions you have with your health care provider. Document Released: 03/13/2000 Document Revised: 01/15/2016 Document Reviewed: 05/28/2015 Elsevier Interactive Patient Education  2018 [employer]  

## 2016-11-26 DIAGNOSIS — Z86010 Personal history of colonic polyps: Secondary | ICD-10-CM | POA: Insufficient documentation

## 2016-11-27 ENCOUNTER — Telehealth: Payer: Self-pay

## 2016-11-27 DIAGNOSIS — I809 Phlebitis and thrombophlebitis of unspecified site: Secondary | ICD-10-CM | POA: Insufficient documentation

## 2016-11-27 DIAGNOSIS — C329 Malignant neoplasm of larynx, unspecified: Secondary | ICD-10-CM | POA: Insufficient documentation

## 2016-11-27 DIAGNOSIS — Z8719 Personal history of other diseases of the digestive system: Secondary | ICD-10-CM | POA: Insufficient documentation

## 2016-11-27 DIAGNOSIS — K649 Unspecified hemorrhoids: Secondary | ICD-10-CM

## 2016-11-27 DIAGNOSIS — L409 Psoriasis, unspecified: Secondary | ICD-10-CM

## 2016-11-27 DIAGNOSIS — H25019 Cortical age-related cataract, unspecified eye: Secondary | ICD-10-CM

## 2016-11-27 DIAGNOSIS — R918 Other nonspecific abnormal finding of lung field: Secondary | ICD-10-CM

## 2016-11-27 HISTORY — DX: Unspecified hemorrhoids: K64.9

## 2016-11-27 HISTORY — DX: Psoriasis, unspecified: L40.9

## 2016-11-27 HISTORY — DX: Cortical age-related cataract, unspecified eye: H25.019

## 2016-11-27 NOTE — Telephone Encounter (Signed)
Spoke with [facility] in specialty scheduling at this time. She will return my phone call on Monday morning with patient's scheduled appt for PFT's.

## 2016-11-27 NOTE — Telephone Encounter (Signed)
Call received from patient's daughter. Patient would like to see Dr. Genevive Bi for new lung mass. Patient placed on schedule per Dr. Genevive Bi on Wednesday 12/02/16. Daughter given all appointment information. Dr. Genevive Bi has ordered PFT's to be done on Tuesday so that surgery may be scheduled asap.  Patient has been tenatively scheduled for surgery 12/15/16 with Dr. Genevive Bi. Patient will be scheduled for PAT right after his Wednesday morning appointment with Dr. Genevive Bi.

## 2016-12-01 NOTE — Telephone Encounter (Signed)
Call made to specialty scheduling at this time. No answer. Left voicemail explaining that I need to set up PFT's for this patient. Awaiting return phone call.

## 2016-12-02 ENCOUNTER — Inpatient Hospital Stay: Admission: RE | Admit: 2016-12-02 | Payer: Self-pay | Source: Ambulatory Visit

## 2016-12-02 ENCOUNTER — Encounter: Payer: Self-pay | Admitting: Cardiothoracic Surgery

## 2016-12-02 ENCOUNTER — Ambulatory Visit (INDEPENDENT_AMBULATORY_CARE_PROVIDER_SITE_OTHER): Payer: PPO | Admitting: Cardiothoracic Surgery

## 2016-12-02 VITALS — BP 184/92 | HR 64 | Temp 98.0°F | Resp 18 | Ht 71.0 in | Wt 208.0 lb

## 2016-12-02 DIAGNOSIS — R918 Other nonspecific abnormal finding of lung field: Secondary | ICD-10-CM | POA: Diagnosis not present

## 2016-12-02 NOTE — Progress Notes (Signed)
Patient ID: Curtis Andrade., male   DOB: 1945-08-10, 71 y.o.   MRN: [id number]  Chief Complaint  Patient presents with  . New Patient (Initial Visit)    Lung mass    Referred By Dr. Emily Filbert Reason for Referral RUL nodule  HPI Location, Quality, Duration, Severity, Timing, Context, Modifying Factors, Associated Signs and Symptoms.  Curtis Andrade. is a 71 y.o. male.  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 As part of his routine evaluation he receives annual chest x-rays. He had a chest x-ray made about a month ago which revealed a new abnormality and because of that a CT scan was ordered. CT scan revealed a 1.3 cm right upper lobe nodule immediately adjacent to the mediastinum. This was not amenable to biopsy. A PET scan was performed which did not reveal any evidence of uptake within the nodule. He was sent to me for consideration of other diagnostic and therapeutic options.  The patient states that he has no shortness of breath. He's had no hemoptysis fevers or chills. He is very active working out 3 times a week. He does not appear to have any functional limitations. He's had no prior cardiac history. He has had prior radiation therapy to his neck but no other treatments for cancer such as basal cell squamous cell or melanoma.   Past Medical History:  Diagnosis Date  . Cancer ([place]) 2010   throat /squamous cell with radiation  . 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 Colon polyp 2013  . GERD (gastroesophageal reflux disease)   . Hemorrhoids 11/27/2016  . Laryngeal cancer ([place]) 11/27/2016   Overview:  2010 Stage I, T1, NO, MO squamous cell carcinoma treated with radiation therapy, followed by Dr. Baruch Gouty and Dr. Nadeen Landau  . Phlebitis 11/27/2016  . 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/27/2016    Past Surgical History:  Procedure Laterality Date  . COLONOSCOPY  2013   Dr Vira Agar  . THROAT  SURGERY  2010    Family History  Problem Relation Age of Onset  . Alzheimer's disease Mother   . Alzheimer's disease Father   . Stroke Father   . Colon cancer Neg Hx     Social History Social History  Substance Use Topics  . Smoking status: Former Smoker    Years: 50.00    Quit date: 03/31/2007  . Smokeless tobacco: Never Used  . Alcohol use No    No Known Allergies  Current Outpatient Prescriptions  Medication Sig Dispense Refill  . Ascorbic Acid (VITAMIN C) 1000 MG tablet Take 1,000 mg by mouth daily.    . ASHWAGANDHA PO Take 1 tablet by mouth 2 (two) times daily.     Marland Kitchen aspirin EC 81 MG tablet Take 81 mg by mouth daily.     . folic acid (FOLVITE) 062 MCG tablet Take 800 mcg by mouth daily.    . Misc Natural Products (GLUCOSAMINE CHOND MSM FORMULA PO) Take 1 tablet by mouth daily.    . Multiple Vitamin (MULTI-VITAMINS) TABS Take 1 tablet by mouth daily.     . Omega-3 1000 MG CAPS Take 1,000 mg by mouth daily.     Marland Kitchen omeprazole (PRILOSEC) 40 MG capsule Take 40 mg by mouth daily before supper.     . polyethylene glycol powder (GLYCOLAX/MIRALAX) powder 255 grams one bottle for colonoscopy prep 255 g 0   No current facility-administered medications for this  visit.       Review of Systems A complete review of systems was asked and was negative except for the following positive findingsOccasional joint pain and varicose veins bilateral lower extremities  Blood pressure (!) 184/92, pulse 64, temperature 98 F (36.7 C), temperature source Oral, resp. rate 18, height 5\' 11"  (1.803 m), weight 208 lb (94.3 kg), SpO2 98 %.  Physical Exam CONSTITUTIONAL:  Pleasant, well-developed, well-nourished, and in no acute distress. EYES: Pupils equal and reactive to light, Sclera non-icteric EARS, NOSE, MOUTH AND THROAT:  The oropharynx was clear.  Dentition is good repair.  Oral mucosa pink and moist. LYMPH NODES:  Lymph nodes in the neck and axillae were normal RESPIRATORY:  Lungs were  clear.  Normal respiratory effort without pathologic use of accessory muscles of respiration CARDIOVASCULAR: Heart was regular without murmurs.  There were no carotid bruits. GI: The abdomen was soft, nontender, and nondistended. There were no palpable masses. There was no hepatosplenomegaly. There were normal bowel sounds in all quadrants. GU:  Rectal deferred.   MUSCULOSKELETAL:  Normal muscle strength and tone.  No clubbing or cyanosis.  Significant varicosities bilaterally   SKIN:  There were no pathologic skin lesions.  There were no nodules on palpation. NEUROLOGIC:  Sensation is normal.  Cranial nerves are grossly intact. PSYCH:  Oriented to person, place and time.  Mood and affect are normal.  Data Reviewed CT scan and PET scan  I have personally reviewed the patient's imaging, laboratory findings and medical records.    Assessment    I have independently reviewed the patient's CT scan and PET scan. I compared this to a scan from 2010. There are no intervening CT scans. The nodule was not present 2010. A new nodule certainly would be consistent with either metastatic or primary malignancy. Benign condition such as inflammation and/or infection are other less likely possibilities.    Plan    I had a long discussion with the patient and his wife and daughter. The nodule is new since 2010. Certainly a malignancy is high on my list of differentials. I reviewed with him the options. Because of its location the radiologist did not feel that this was amenable to biopsy. Therefore the only options available to [place] at this point would be continued surveillance in 4 weeks with a repeat CT scan or surgical resection. I reviewed with him in detail the indications and risks of surgical therapy including thoracoscopy or thoracotomy with wedge resection or lobectomy. I reviewed in detail the indications and risks of the various procedures and the techniques. He understands that at the present time  without a specific diagnosis it is difficult to make definitive recommendations.  I also reviewed with him the possibility of performing stereotactic body frame radiotherapy as part of the treatment modality for early stage lung cancer. He would like to think about all his options and would back in touch with [place].  I do think it would be helpful to have our ENT physicians take a look at his larynx to make sure there is no residual tumor present. I also think we should repeat some pulmonary function studies. I gave him my business card and he will contact me later this week to schedule any additional tests that need to be accomplished. We did set up no our date in about 2 weeks just to reserve room should he choose to go that route.       [name], MD 12/02/2016, 10:51 AM

## 2016-12-02 NOTE — Patient Instructions (Addendum)
Call me and just let me know what you would like to do. I will reschedule your PAT visit.   We have discussed taking out a lobe of your lung to remove the tumor. We have arranged this to be done on 12/16/16 by Dr. Genevive Bi at [facility].  You will be in the hospital for 5-7 days for surgery and recovery. During that period of time, you will most likely be in the Intensive Care Unit for 1-2 days. Expect to be completely back to yourself within 3 months of the date of surgery.  Do NOT take any products containing Aspirin or Ibuprofen 5 days prior to your surgery. (Ex.- Goody Powder, Excedrin, Advil, Aleve, Ibuprofen, and Aspirin). You may take Tylenol if you need something for pain.   Please bring in any FMLA paperwork or short-term disability before your scheduled surgery and we will fill this out within 3 days of your surgery being completed.  Please refer to your [facility]) Pre-care sheet that you have been given today.  Call and ask to speak with a nurse if you have any questions or concerns regarding your surgery.     We will encourage you to get an Ultrasound of your Aortic Aneurysm yearly to monitor the size of this.  We will set up an appointment with Dr. Anda Latina to see you in the office to ensure that everything looks ok from your throat cancer.    Thoracoscopy Thoracoscopy is a procedure to examine the lungs and other structures in the chest. This procedure uses a thin, lighted telescope with an attached camera (thoracoscope). This instrument is inserted through a small surgical incision in the chest wall. You may have thoracoscopy to:  Diagnose a chest disease.  Obtain a tissue sample to be examined under a microscope (biopsy).  Have medicines put directly into your lungs.  Remove samples of fluid, blood, or pus from your chest.  Have a minor surgical repair.  Tell a health care provider about:  Any allergies you have.  All medicines you are taking, including vitamins, herbs,  eye drops, creams, and over-the-counter medicines.  Any problems you or family members have had with anesthetic medicines.  Any blood disorders you have.  Any surgeries you have had.  Any medical conditions you have. What are the risks? Generally, this is a safe procedure. However, problems may occur, including:  Bleeding. If this happens, your surgeon may need to open your chest with a large incision (thoracotomy) to control the bleeding.  Injury to nerves or other structures in your chest.  Collapsed lung. This can happen after the removal of the chest tube that is used for the procedure. If this happens, the tube may need to be reinserted and left in place for a period of time.  Infection.  Heart rhythm abnormalities.  What happens before the procedure?  You may need to have tests, such as: ? Blood tests. ? Urine tests. ? Chest X-rays. ? Electrocardiogram (ECG).  Follow your health care provider's instructions about eating or drinking restrictions.  Ask your health care provider if you should plan to have someone take you home after the procedure.  Ask your health care provider about: ? Changing or stopping your regular medicines. This is especially important if you are taking diabetes medicines or blood thinners. ? Taking medicines such as aspirin and ibuprofen. These medicines can thin your blood. Do not take these medicines before your procedure if your health care provider instructs you not to. What happens  during the procedure?  An IV tube will be inserted into one of your veins.  You will be given one of the following: ? A medicine that numbs the area (local anesthetic). If you are given a local anesthetic, you may also be given a medicine that makes you relax (sedative). ? A medicine that makes you fall asleep (general anesthetic).  Your chest area will be cleaned with a germ-killing solution (antiseptic).  Your surgeon will make two or three small incisions  under your arm.  Your surgeon will place the thoracoscope through one of the incisions and use it to look inside your chest. The camera will project the images from the scope to a TV monitor in the operating room.  Your surgeon will use the other incisions to insert long, thin surgical instruments to take a sample of lung tissue or do a minor repair.  The scope and instruments will be removed.  A small drain will be left in your chest to drain any blood or fluid that collects after surgery.  The incision will be closed with stitches (sutures).  A bandage (dressing) may be placed over the incision area. The procedure may vary among health care providers and hospitals. What happens after the procedure?  You will spend some time in a recovery area until the anesthetic wears off.  You may be given medicine for pain as needed.  Your blood pressure, heart rate, breathing rate, and blood oxygen level will be monitored often until the medicines you were given have worn off.  Your drain may be removed before you go home. This information is not intended to replace advice given to you by your health care provider. Make sure you discuss any questions you have with your health care provider. Document Released: 12/13/2002 Document Revised: 11/16/2015 Document Reviewed: 11/29/2013 Elsevier Interactive Patient Education  2018 [employer].    Lung Resection A lung resection is a procedure to remove part or all of a lung. When an entire lung is removed, the procedure is called a pneumonectomy. When only part of a lung is removed, the procedure is called a lobectomy. A lung resection is typically done to get rid of a tumor or cancer, but it may be done to treat other conditions. This procedure can help relieve some or all of your symptoms and can also help keep the problem from getting worse. Lung resection may provide the best chance for curing your disease. However, the procedure may not necessarily  cure lung cancer if that is the problem. Tell a health care provider about:  Any allergies you have.  All medicines you are taking, including vitamins, herbs, eye drops, creams, and over-the-counter medicines.  Any problems you or family members have had with anesthetic medicines.  Any blood disorders you have.  Any surgeries you have had.  Any medical conditions you have. What are the risks? Generally, lung resection is a safe procedure. However, problems can occur and include:  Excessive bleeding.  Infection.  Inability to breathe without a ventilator.  Persistent shortness of breath.  Heart problems, including abnormal rhythms and a risk of heart attack or heart failure.  Blood clots.  Injury to a blood vessel.  Injury to a nerve.  Failure to heal properly.  Stroke.  Bronchopleural fistula. This is a small hole between one of the main breathing tubes (bronchus) and the lining of the lungs. This is rare.  Reaction to anesthesia.  What happens before the procedure? You may have tests  done before the procedure, including:  Blood tests.  Urine tests.  X-rays.  Other imaging tests (such as CT scans, MRI scans, and PET scans). These tests are done to find the exact size and location of the condition being treated with this surgery.  Pulmonary function tests. These are breathing tests to assess the function of your lungs before surgery and to decide how to best help your breathing after surgery.  Heart testing. This is done to make sure your heart is strong enough for the procedure.  Bronchoscopy. This is a technique that allows your health care provider to look at the inside of your airways. This is done using a soft, flexible tube (bronchoscope). Along with imaging tests, this can help your health care provider know the exact location and size of the area that will be removed during surgery.  Lymph node sampling. This may need to be done to see if the tumor has  spread. It may be done as a separate surgery or right before your lung resection procedure.  What happens during the procedure?  An IV tube will be placed in your arm. You will be given a medicine that makes you fall asleep (general anesthetic). You may also get pain medicine through a thin, flexible tube (catheter) in your back.  A breathing tube will be placed in your throat.  Once the surgical team has prepared you for surgery, your surgeon will make an incision on your side. Some resections are done through large incisions, while others can be done through small incisions using smaller instruments and assisted with small cameras (laparoscopic surgery).  Your surgeon will carefully cut the veins, arteries, and bronchus leading to your lung. After being cut, each of these pieces will be sewn or stapled closed. The lung or part of the lung will then be removed.  Your surgeon will check inside your chest to make sure there is no bleeding in or around the lungs. Lymph nodes near the lung may also be removed for later tests.  Your surgeon may put tubes into your chest to drain extra fluid and air after surgery.  Your incision will be closed. This may be done using: ? Stitches that absorb into your body and do not need to be removed. ? Stitches that must be removed. ? Staples that must be removed. What happens after the procedure?  You will be taken to the recovery area and your progress will be monitored. You may still have a breathing tube and other tubes or catheters in your body immediately after surgery. These will be removed during your recovery. You may be put on a respirator following surgery if some assistance is needed to help your breathing. When you are awake and not experiencing immediate problems from surgery, you will be moved to the intensive care unit (ICU) where you will continue your recovery.  You may feel pain in your chest and throat. Sometimes during recovery, patients may  shiver or feel nauseous. You will be given medicine to help with pain and nausea.  The breathing tube will be taken out as soon as your health care providers feel you can breathe on your own. For most people, this happens on the same day as the surgery.  If your surgery and time in the ICU go well, most of the tubes and equipment will be taken out within 1-2 days after surgery. This is about how long most people stay in the ICU. You may need to stay longer, depending  on how you are doing.  You should also start respiratory therapy in the ICU. This therapy uses breathing exercises to help your other lung stay healthy and get stronger.  As you improve, you will be moved to a regular hospital room for continued respiratory therapy, help with your bladder and bowels, and to continue medicines.  After your lung or part of your lung is taken out, there will be a space inside your chest. This space will often fill up with fluid over time. The amount of time this takes is different for each person.  You will receive care until you are doing well and your health care provider feels it is safe for you to go home or to transfer to an extended care facility. This information is not intended to replace advice given to you by your health care provider. Make sure you discuss any questions you have with your health care provider. Document Released: 06/06/2002 Document Revised: 08/22/2015 Document Reviewed: 05/05/2013 Elsevier Interactive Patient Education  2018 [employer].   Thoracotomy Thoracotomy is surgery to open the chest to get access to the organs and tissue inside. This type of surgery is often used to repair or treat the lungs, heart, or arteries, or to remove tissue. Tell a health care provider about:  Any allergies you have.  All medicines you are taking, including vitamins, herbs, eye drops, creams, and over-the-counter medicines.  Any problems you or family members have had with anesthetic  medicines.  Any blood disorders you have.  Any surgeries you have had.  Any medical conditions you have.  Whether you are pregnant or may be pregnant. What are the risks? Generally, this is a safe procedure. However, problems may occur, including:  Infection.  Severe bleeding (hemorrhage).  Allergic reaction to medicines.  Damage to other structures or organs, such as nerves.  Abnormal heart rhythm (arrhythmia).  Respiratory failure. This is when not enough oxygen passes from the lungs into the blood. In the case of this procedure, this is most often caused by lung collapse.  What happens before the procedure? Medicines  Ask your health care provider about: ? Changing or stopping your regular medicines. This is especially important if you are taking diabetes medicines or blood thinners. ? Taking medicines such as aspirin and ibuprofen. These medicines can thin your blood. Do not take these medicines before your procedure if your health care provider instructs you not to.  You may be given antibiotic medicine to help prevent infection. Staying hydrated Follow instructions from your health care provider about hydration, which may include:  Up to 2 hours before the procedure - you may continue to drink clear liquids, such as water, clear fruit juice, black coffee, and plain tea.  Eating and drinking restrictions Follow instructions from your health care provider about eating and drinking, which may include:  8 hours before the procedure - stop eating heavy meals or foods such as meat, fried foods, or fatty foods.  6 hours before the procedure - stop eating light meals or foods, such as toast or cereal.  6 hours before the procedure - stop drinking milk or drinks that contain milk.  2 hours before the procedure - stop drinking clear liquids.  General instructions  You may have tests, such as: ? X-rays. ? MRI. ? CT scan. ? Tests of mucus from your lungs (sputum  culture). ? Blood tests. ? Lung (pulmonary) function tests. ? A test of heart function and rhythm (electrocardiogram, ECG). ? A  test to evaluate the blood vessels in your lungs (pulmonary angiogram).  You may be asked to shower with a germ-killing soap.  Ask your health care provider how your surgical site will be marked or identified.  Plan to have someone take you home from the hospital.  Do not use any products that contain nicotine or tobacco, such as cigarettes and e-cigarettes. If you need help quitting, ask your health care provider. What happens during the procedure?  To lower your risk of infection: ? Your health care team will wash or sanitize their hands. ? Your skin will be washed with soap.  An IV tube will be inserted into one of your veins.  You will be given a medicine to make you fall asleep (general anesthetic). You may also be given a medicine to help you relax (sedative).  A thin tube (catheter) will be inserted into your urethra and bladder to drain your urine.  A tube will be placed down your throat to help you breathe.  A 5-10 inch (13-25 cm) incision will be made in your chest. The size and exact location of the incision varies depending on the purpose of the procedure.  A tool (retractor) will be used to separate muscle, tissue, and in some cases, your rib cage. This is done to create easier access to organs and tissue.  Any damaged or diseased tissue inside of your chest will be removed.  A chest tube will be inserted between your ribs to drain fluid. This helps to prevent fluid buildup in your lungs.  Your incision will be closed with stitches (sutures) or staples.  A bandage (dressing) may be placed over the incision. The procedure may vary among health care providers and hospitals. What happens after the procedure?  Your blood pressure, heart rate, breathing rate, and blood oxygen level will be monitored.  You will be given pain medicine as  needed.  You will continue to have a chest tube draining fluid from your lungs for 24-48 hours. You will be monitored closely for signs of fluid buildup in your lungs.  You may continue: ? To have a breathing tube. ? To receive fluids and medicines through an IV tube. ? To have a catheter draining your urine.  You may have to wear compression stockings. These stockings help to prevent blood clots and reduce swelling in your legs.  You may be shown how to do breathing exercises and how to use a tool that measures how well you are filling your lungs with each breath (incentive spirometer). These can help prevent pneumonia. Summary  Thoracotomy is surgery to open the chest to get access to the organs and tissue inside.  This type of surgery is often used to repair or treat the lungs, heart, or arteries, or to remove tissue.  During the procedure, a 5-10 inch (13-25 cm) incision will be made in your chest. The size and exact location of the incision varies depending on the purpose of the procedure.  After this procedure, you will continue to have a chest tube draining fluid from your lungs for 24-48 hours. You will be monitored closely for signs of fluid buildup in your lungs. This information is not intended to replace advice given to you by your health care provider. Make sure you discuss any questions you have with your health care provider. Document Released: 12/13/2002 Document Revised: 12/09/2015 Document Reviewed: 12/09/2015 Elsevier Interactive Patient Education  2017 [employer].

## 2016-12-02 NOTE — Telephone Encounter (Signed)
Call made to specialty scheduling.Spoke with Ritchie. She explained that no one has been in since Friday that can give her permission to place this patient on the schedule and that no one will be here today as well.   Spoke with Dr. Genevive Bi. He has been advised and will cooperate in having patient scheduled as soon as possible so that surgery can continue as scheduled on 12/16/16.

## 2016-12-02 NOTE — Telephone Encounter (Signed)
Received notification from Central scheduling that patient has PFT's to be done tomorrow at 1pm and that patient has been notified.

## 2016-12-03 ENCOUNTER — Ambulatory Visit: Payer: PPO | Attending: Cardiothoracic Surgery

## 2016-12-03 DIAGNOSIS — R918 Other nonspecific abnormal finding of lung field: Secondary | ICD-10-CM | POA: Diagnosis not present

## 2016-12-03 LAB — BLOOD GAS, ARTERIAL
ACID-BASE EXCESS: 3.3 mmol/L — AB (ref 0.0–2.0)
ALLENS TEST (PASS/FAIL): POSITIVE — AB
BICARBONATE: 26.9 mmol/L (ref 20.0–28.0)
FIO2: 21
O2 Saturation: 95.6 %
PATIENT TEMPERATURE: 37
pCO2 arterial: 37 mmHg (ref 32.0–48.0)
pH, Arterial: 7.47 — ABNORMAL HIGH (ref 7.350–7.450)
pO2, Arterial: 74 mmHg — ABNORMAL LOW (ref 83.0–108.0)

## 2016-12-09 ENCOUNTER — Telehealth: Payer: Self-pay

## 2016-12-09 DIAGNOSIS — R911 Solitary pulmonary nodule: Secondary | ICD-10-CM

## 2016-12-09 NOTE — Telephone Encounter (Signed)
Call made to patient at this time to speak with him regarding his decision for surgery.

## 2016-12-09 NOTE — Telephone Encounter (Signed)
Patient returned my phone call. He states that he would like to repeat the CT Scan in 3 weeks as Dr. Genevive Bi had given him the option to do at his visit and then return to speak with Dr. Genevive Bi in order to make a decision regarding surgery. But, as of right now, he would like to cancel current surgery scheduled on 12/16/16.   Surgery has been cancelled with OR. Call made to PAT appointment cancelled.

## 2016-12-09 NOTE — Telephone Encounter (Signed)
Spoke with Quillian Quince from [facility] at this time. He states that patient is still not scheduled with Dr. Tami Ribas as the patient was to make this appointment last week. He asked that I fax over a letter stating why patient needed seen as soon as possible. Letter has been written and is in chart.  Faxed [phone] at this time Attn: Quillian Quince  Will call at a later time to check on status of this appointment.

## 2016-12-09 NOTE — Telephone Encounter (Signed)
Call made to Radiology at this time [phone]). Spoke with [employer]. She is requesting prior images from [facility] so that she may push them through PACS. Will await images to review with Dr. Genevive Bi.  Also, Call was made to Cardiopulmonary at this time to request PFT results. Spoke with male who does PFT's and she states that she will scan them in now and have Dr. Mortimer Fries read them in the next 24 hours. Results should be expected in 24 hours.

## 2016-12-09 NOTE — Telephone Encounter (Signed)
Patient called back, if you will give him a call when you get a chance, patient said he was fixing to go somewhere if we could just leave him a detail message.

## 2016-12-10 NOTE — Telephone Encounter (Signed)
I spoke with Dr. Genevive Bi and he would like to see patient back in 1 month with CT Scan Chest WITH Contrast prior to appointment. Orders placed. Appointment for CT Scheduled for: 01/04/17 at 0800 at [facility]. Liquids only 4 hours prior.  Follow-up scheduled with Dr. Genevive Bi on 01/04/17 immediately after CT Scan scheduled.   Patient has been notified of all appointment information.

## 2016-12-10 NOTE — Telephone Encounter (Signed)
Radiology Images are available for viewing.  PFT's are now available.  Appointment has not been made yet with ENT. Call made to their office at this time. Spoke with [employer]. She is requesting a new demographic sheet. This has been faxed to the office at this time.

## 2016-12-14 ENCOUNTER — Other Ambulatory Visit: Payer: PPO

## 2016-12-15 DIAGNOSIS — Z8521 Personal history of malignant neoplasm of larynx: Secondary | ICD-10-CM | POA: Diagnosis not present

## 2016-12-16 ENCOUNTER — Inpatient Hospital Stay: Admission: RE | Admit: 2016-12-16 | Payer: PPO | Source: Ambulatory Visit | Admitting: Cardiothoracic Surgery

## 2016-12-16 ENCOUNTER — Encounter: Admission: RE | Payer: Self-pay | Source: Ambulatory Visit

## 2016-12-16 SURGERY — VIDEO ASSISTED THORACOSCOPY
Anesthesia: Choice | Laterality: Right

## 2016-12-23 ENCOUNTER — Ambulatory Visit: Payer: PPO | Admitting: Anesthesiology

## 2016-12-23 ENCOUNTER — Encounter
Admission: RE | Disposition: A | Discharge status: Home / Self Care | Payer: Self-pay | Source: Ambulatory Visit | Attending: General Surgery

## 2016-12-23 ENCOUNTER — Ambulatory Visit
Admission: RE | Admit: 2016-12-23 | Discharge: 2016-12-23 | Disposition: A | Discharge status: Home / Self Care | Payer: PPO | Source: Ambulatory Visit | Attending: General Surgery | Admitting: General Surgery

## 2016-12-23 DIAGNOSIS — Z86010 Personal history of colonic polyps: Secondary | ICD-10-CM | POA: Diagnosis not present

## 2016-12-23 DIAGNOSIS — K5730 Diverticulosis of large intestine without perforation or abscess without bleeding: Secondary | ICD-10-CM | POA: Insufficient documentation

## 2016-12-23 DIAGNOSIS — Z79899 Other long term (current) drug therapy: Secondary | ICD-10-CM | POA: Diagnosis not present

## 2016-12-23 DIAGNOSIS — K219 Gastro-esophageal reflux disease without esophagitis: Secondary | ICD-10-CM | POA: Insufficient documentation

## 2016-12-23 DIAGNOSIS — Z87891 Personal history of nicotine dependence: Secondary | ICD-10-CM | POA: Diagnosis not present

## 2016-12-23 DIAGNOSIS — Z1211 Encounter for screening for malignant neoplasm of colon: Secondary | ICD-10-CM | POA: Insufficient documentation

## 2016-12-23 DIAGNOSIS — K5790 Diverticulosis of intestine, part unspecified, without perforation or abscess without bleeding: Secondary | ICD-10-CM | POA: Diagnosis not present

## 2016-12-23 HISTORY — PX: COLONOSCOPY WITH PROPOFOL: SHX5780

## 2016-12-23 SURGERY — COLONOSCOPY WITH PROPOFOL
Anesthesia: General

## 2016-12-23 MED ORDER — PROPOFOL 10 MG/ML IV BOLUS
INTRAVENOUS | Status: DC | PRN
  Administered 2016-12-23: 30 mg via INTRAVENOUS

## 2016-12-23 MED ORDER — FENTANYL CITRATE (PF) 100 MCG/2ML IJ SOLN
INTRAMUSCULAR | Status: DC | PRN
  Administered 2016-12-23: 100 ug via INTRAVENOUS

## 2016-12-23 MED ORDER — LIDOCAINE HCL (PF) 1 % IJ SOLN
INTRAMUSCULAR | Status: AC
  Administered 2016-12-23: 0.3 mL via INTRADERMAL
  Filled 2016-12-23: qty 2

## 2016-12-23 MED ORDER — LIDOCAINE HCL (PF) 2 % IJ SOLN
INTRAMUSCULAR | Status: AC
  Filled 2016-12-23: qty 2

## 2016-12-23 MED ORDER — PROPOFOL 500 MG/50ML IV EMUL
INTRAVENOUS | Status: AC
  Filled 2016-12-23: qty 50

## 2016-12-23 MED ORDER — PROPOFOL 500 MG/50ML IV EMUL
INTRAVENOUS | Status: DC | PRN
  Administered 2016-12-23: 120 ug/kg/min via INTRAVENOUS

## 2016-12-23 MED ORDER — LIDOCAINE HCL (PF) 1 % IJ SOLN
2.0000 mL | Freq: Once | INTRAMUSCULAR | Status: AC
  Administered 2016-12-23: 0.3 mL via INTRADERMAL

## 2016-12-23 MED ORDER — FENTANYL CITRATE (PF) 250 MCG/5ML IJ SOLN
INTRAMUSCULAR | Status: AC
  Filled 2016-12-23: qty 5

## 2016-12-23 MED ORDER — FENTANYL CITRATE (PF) 100 MCG/2ML IJ SOLN
INTRAMUSCULAR | Status: AC
  Filled 2016-12-23: qty 2

## 2016-12-23 MED ORDER — SODIUM CHLORIDE 0.9 % IV SOLN
INTRAVENOUS | Status: DC
  Administered 2016-12-23: 1000 mL via INTRAVENOUS

## 2016-12-23 NOTE — Anesthesia Post-op Follow-up Note (Signed)
Anesthesia QCDR form completed.        

## 2016-12-23 NOTE — Anesthesia Preprocedure Evaluation (Signed)
Anesthesia Evaluation  Patient identified by MRN, date of birth, ID band Patient awake    Reviewed: Allergy & Precautions, NPO status , Patient's Chart, lab work & pertinent test results  Airway Mallampati: I       Dental  (+) Teeth Intact   Pulmonary neg pulmonary ROS, former smoker,     + decreased breath sounds      Cardiovascular Exercise Tolerance: Good  Rhythm:Regular     Neuro/Psych negative psych ROS   GI/Hepatic Neg liver ROS, GERD  Medicated,  Endo/Other  negative endocrine ROS  Renal/GU negative Renal ROS     Musculoskeletal negative musculoskeletal ROS (+)   Abdominal Normal abdominal exam  (+)   Peds negative pediatric ROS (+)  Hematology negative hematology ROS (+)   Anesthesia Other Findings   Reproductive/Obstetrics                             Anesthesia Physical Anesthesia Plan  ASA: II  Anesthesia Plan: General   Post-op Pain Management:    Induction: Intravenous  PONV Risk Score and Plan: 0  Airway Management Planned: Natural Airway and Nasal Cannula  Additional Equipment:   Intra-op Plan:   Post-operative Plan:   Informed Consent: I have reviewed the patients History and Physical, chart, labs and discussed the procedure including the risks, benefits and alternatives for the proposed anesthesia with the patient or authorized representative who has indicated his/her understanding and acceptance.     Plan Discussed with: Surgeon  Anesthesia Plan Comments:         Anesthesia Quick Evaluation

## 2016-12-23 NOTE — Transfer of Care (Signed)
Immediate Anesthesia Transfer of Care Note  Patient: Curtis Andrade.  Procedure(s) Performed: Procedure(s): COLONOSCOPY WITH PROPOFOL (N/A)  Patient Location: PACU  Anesthesia Type:General  Level of Consciousness: awake and alert   Airway & Oxygen Therapy: Patient Spontanous Breathing and Patient connected to nasal cannula oxygen  Post-op Assessment: Report given to RN and Post -op Vital signs reviewed and stable  Post vital signs: Reviewed  Last Vitals:  Vitals:   12/23/16 1020  BP: (!) 166/82  Pulse: 62  Resp: 20  Temp: (!) 36.2 C  SpO2: 98%    Last Pain:  Vitals:   12/23/16 1020  TempSrc: Tympanic         Complications: No apparent anesthesia complications

## 2016-12-23 NOTE — Op Note (Signed)
[facility] Gastroenterology Patient Name: Curtis Andrade Procedure Date: 12/23/2016 11:14 AM MRN: [id number] Account #: [id number] Date of Birth: Aug 30, 1945 Admit Type: Outpatient Age: 71 Room: [facility] ENDO ROOM 4 Gender: Male Note Status: Finalized Procedure:            Colonoscopy Indications:          Screening for colorectal malignant neoplasm Providers:            Robert Bellow, MD Referring MD:         Irven Easterly. Kary Kos, MD (Referring MD) Medicines:            Monitored Anesthesia Care Complications:        No immediate complications. Procedure:            Pre-Anesthesia Assessment:                       - Prior to the procedure, a History and Physical was                        performed, and patient medications, allergies and                        sensitivities were reviewed. The patient's tolerance of                        previous anesthesia was reviewed.                       - The risks and benefits of the procedure and the                        sedation options and risks were discussed with the                        patient. All questions were answered and informed                        consent was obtained.                       After obtaining informed consent, the colonoscope was                        passed under direct vision. Throughout the procedure,                        the patient's blood pressure, pulse, and oxygen                        saturations were monitored continuously. The                        Colonoscope was introduced through the anus and                        advanced to the the cecum, identified by appendiceal                        orifice and ileocecal valve. The colonoscopy was  performed without difficulty. The patient tolerated the                        procedure well. The quality of the bowel preparation                        was excellent. Findings:      Many medium-mouthed diverticula  were found in the sigmoid colon.      The retroflexed view of the distal rectum and anal verge was normal and       showed no anal or rectal abnormalities. Impression:           - Diverticulosis in the sigmoid colon.                       - The distal rectum and anal verge are normal on                        retroflexion view.                       - No specimens collected. Recommendation:       - Discharge patient to home (via wheelchair). Procedure Code(s):    --- Professional ---                       [id number], Colonoscopy, flexible; diagnostic, including                        collection of specimen(s) by brushing or washing, when                        performed (separate procedure) Diagnosis Code(s):    --- Professional ---                       K57.30, Diverticulosis of large intestine without                        perforation or abscess without bleeding                       Z12.11, Encounter for screening for malignant neoplasm                        of colon CPT copyright 2016 American Medical Association. All rights reserved. The codes documented in this report are preliminary and upon coder review may  be revised to meet current compliance requirements. Robert Bellow, MD 12/23/2016 11:45:37 AM This report has been signed electronically. Number of Addenda: 0 Note Initiated On: 12/23/2016 11:14 AM Scope Withdrawal Time: 0 hours 8 minutes 19 seconds  Total Procedure Duration: 0 hours 17 minutes 44 seconds       [facility]

## 2016-12-23 NOTE — Anesthesia Postprocedure Evaluation (Signed)
Anesthesia Post Note  Patient: Curtis Andrade.  Procedure(s) Performed: Procedure(s) (LRB): COLONOSCOPY WITH PROPOFOL (N/A)  Patient location during evaluation: PACU Anesthesia Type: General Level of consciousness: awake Pain management: pain level controlled Vital Signs Assessment: post-procedure vital signs reviewed and stable Respiratory status: spontaneous breathing Cardiovascular status: stable Anesthetic complications: no     Last Vitals:  Vitals:   12/23/16 1147 12/23/16 1207  BP: 113/69 130/69  Pulse: 65   Resp: 16   Temp: (!) 35.8 C   SpO2:      Last Pain:  Vitals:   12/23/16 1147  TempSrc: Tympanic  PainSc: 0-No pain                 VAN STAVEREN,Mariamawit Depaoli

## 2016-12-23 NOTE — H&P (Signed)
No change in clinical history or exam. Tolerated prep well.

## 2016-12-24 ENCOUNTER — Encounter: Payer: Self-pay | Admitting: General Surgery

## 2016-12-28 ENCOUNTER — Encounter: Payer: Self-pay | Admitting: General Surgery

## 2017-01-04 ENCOUNTER — Ambulatory Visit
Admission: RE | Admit: 2017-01-04 | Discharge: 2017-01-04 | Disposition: A | Discharge status: Home / Self Care | Payer: PPO | Source: Ambulatory Visit | Attending: Cardiothoracic Surgery | Admitting: Cardiothoracic Surgery

## 2017-01-04 ENCOUNTER — Ambulatory Visit (INDEPENDENT_AMBULATORY_CARE_PROVIDER_SITE_OTHER): Payer: PPO | Admitting: Cardiothoracic Surgery

## 2017-01-04 ENCOUNTER — Encounter: Payer: Self-pay | Admitting: Cardiothoracic Surgery

## 2017-01-04 VITALS — BP 182/83 | HR 61 | Temp 97.6°F | Ht 71.0 in | Wt 210.0 lb

## 2017-01-04 DIAGNOSIS — R911 Solitary pulmonary nodule: Secondary | ICD-10-CM | POA: Diagnosis not present

## 2017-01-04 DIAGNOSIS — I700 Atherosclerosis of aorta: Secondary | ICD-10-CM | POA: Diagnosis not present

## 2017-01-04 DIAGNOSIS — R918 Other nonspecific abnormal finding of lung field: Secondary | ICD-10-CM | POA: Diagnosis not present

## 2017-01-04 DIAGNOSIS — J432 Centrilobular emphysema: Secondary | ICD-10-CM | POA: Insufficient documentation

## 2017-01-04 DIAGNOSIS — I2510 Atherosclerotic heart disease of native coronary artery without angina pectoris: Secondary | ICD-10-CM | POA: Diagnosis not present

## 2017-01-04 LAB — POCT I-STAT CREATININE: CREATININE: 1.1 mg/dL (ref 0.61–1.24)

## 2017-01-04 IMAGING — CT CT CHEST W/ CM
2 of 3 series · 15 of 36 positions shown, 18 images · IV contrast (iopamidol)
Comparison: [DATE]

CLINICAL DATA: Followup pulmonary nodule

EXAM:
CT CHEST WITH CONTRAST
TECHNIQUE: Multidetector CT imaging of the chest was performed during
intravenous contrast administration.
CONTRAST:  75mL [BC] IOPAMIDOL ([BC]) INJECTION 61%

[Series 2: axial st · axial · 0.73mm/px · z∈[-347,-49]mm · 12 of 175 slices shown, 15 images]
[im 13/175  mediastinal]
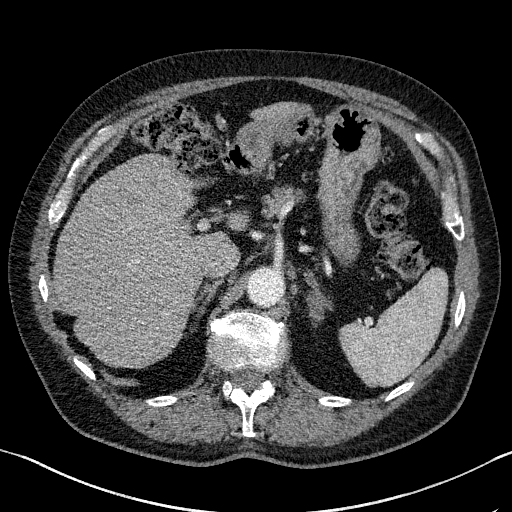
[im 13/175  lung]
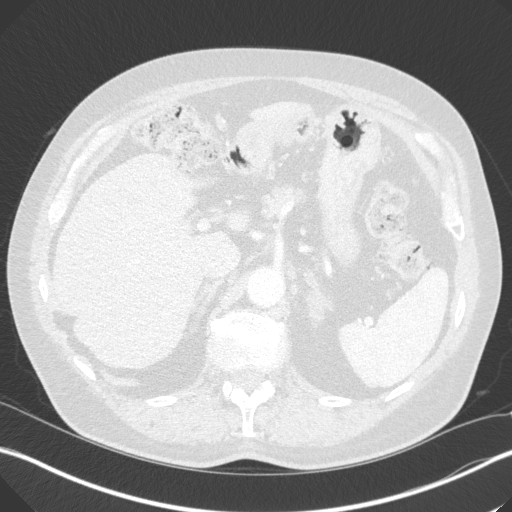
[im 26/175  lung]
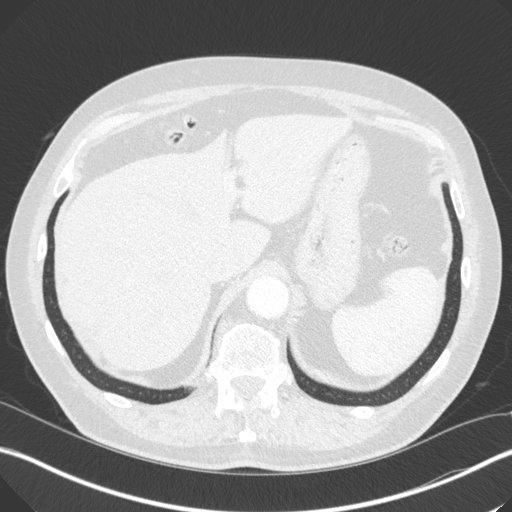
[im 39/175  lung]
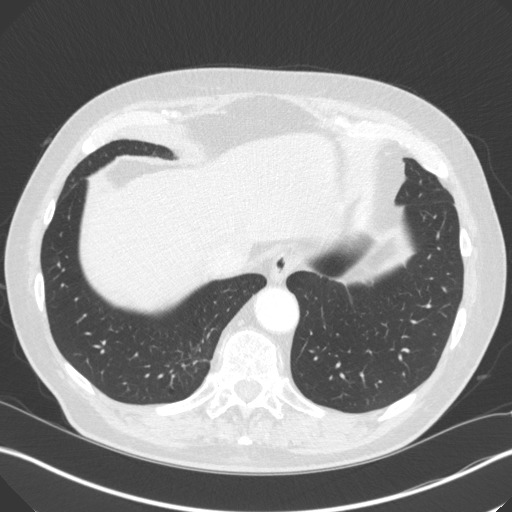
[im 52/175  lung]
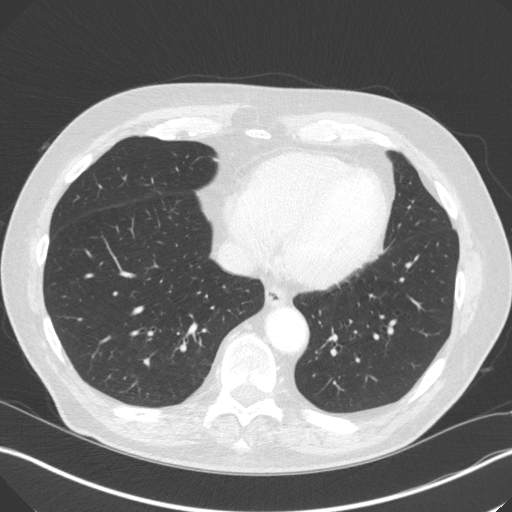
[im 65/175  mediastinal]
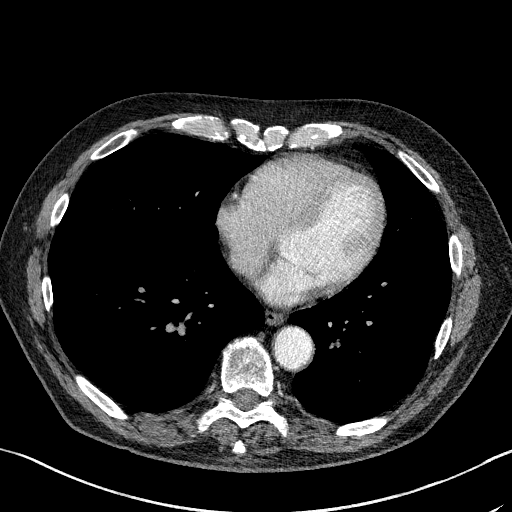
[im 65/175  lung]
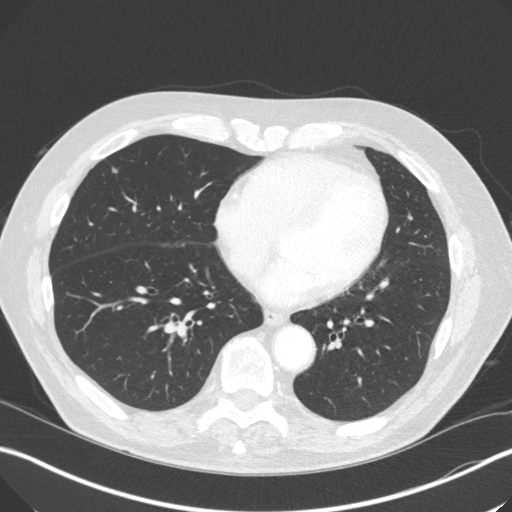
[im 78/175  lung]
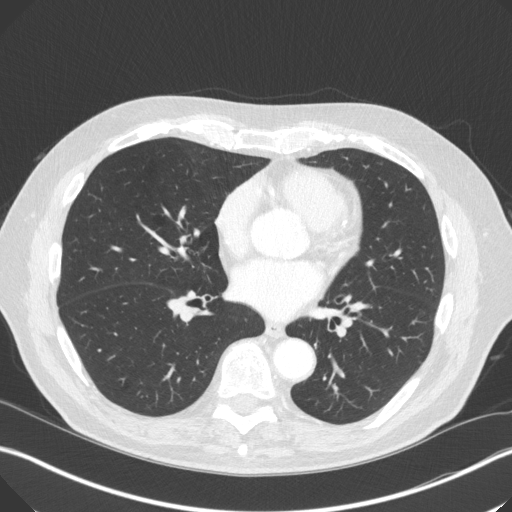
[im 97/175  lung]
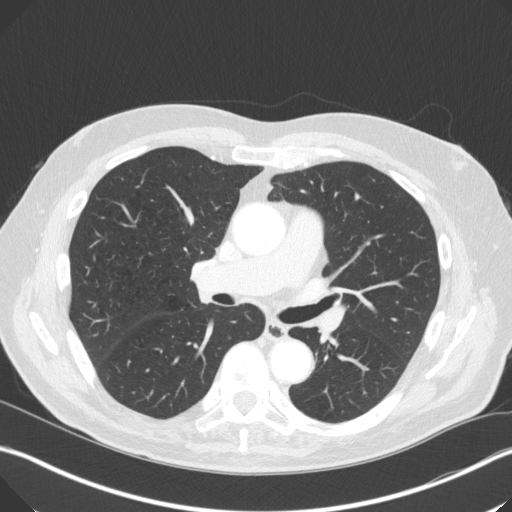
[im 110/175  lung]
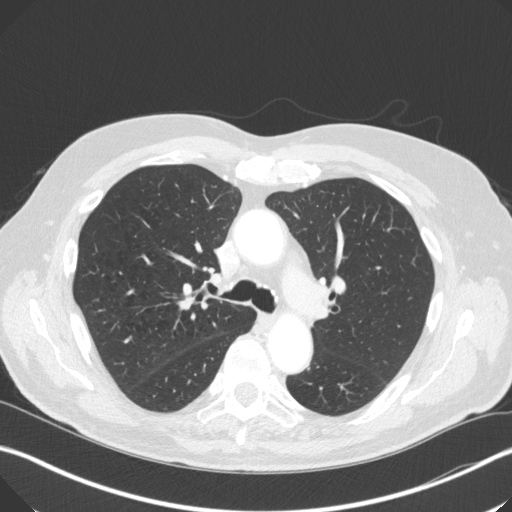
[im 123/175  mediastinal]
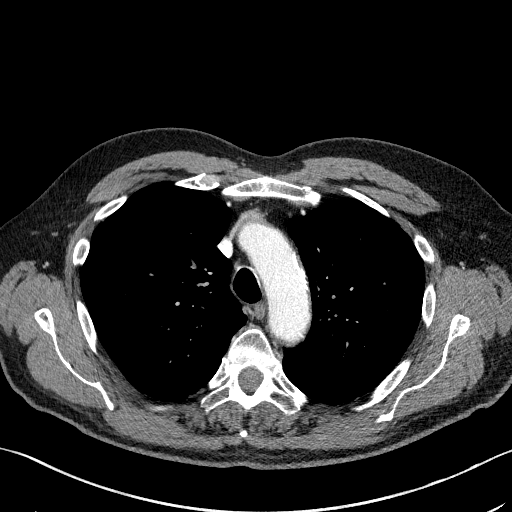
[im 123/175  lung]
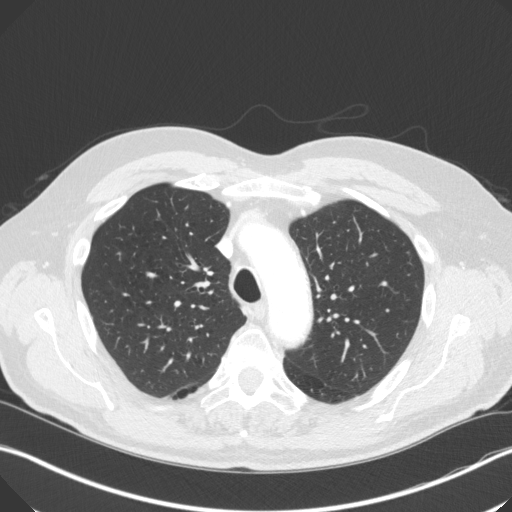
[im 136/175  lung]
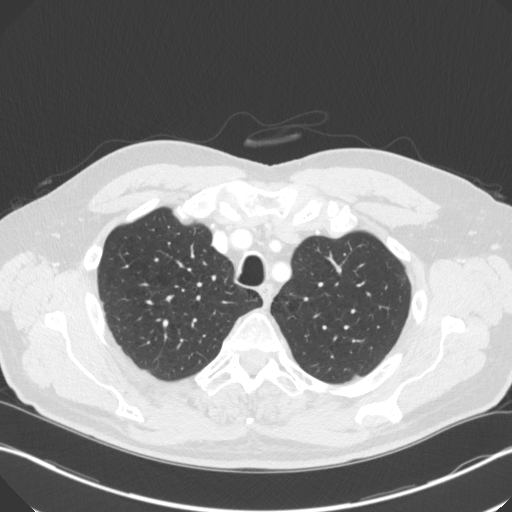
[im 149/175  lung]
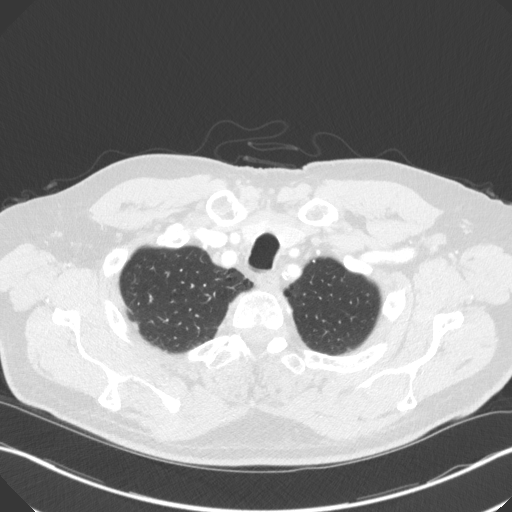
[im 162/175  lung]
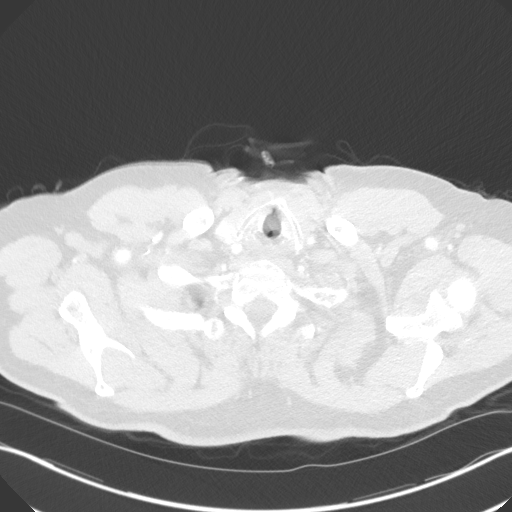

[Series 5: coronal · coronal · 0.69mm/px · 3 of 130 slices shown]
[im 26/130  lung]
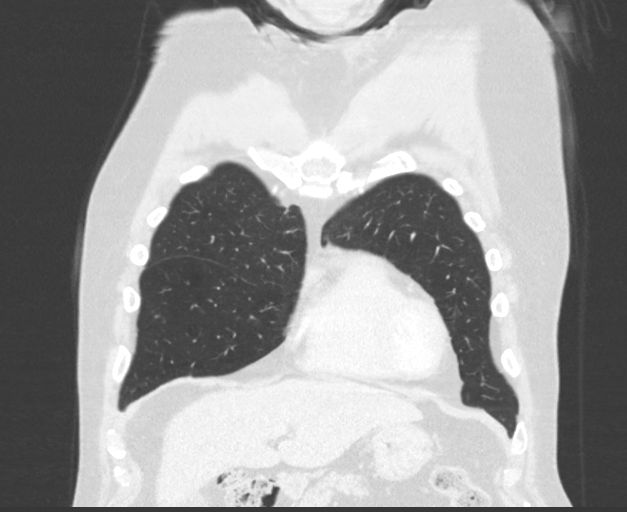
[im 52/130  lung]
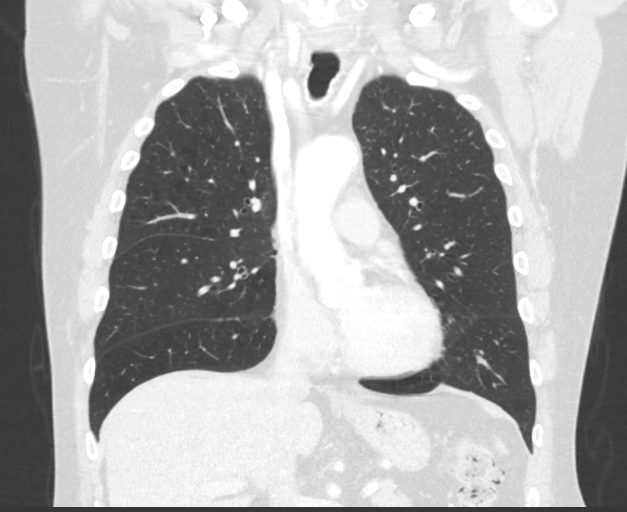
[im 78/130  lung]
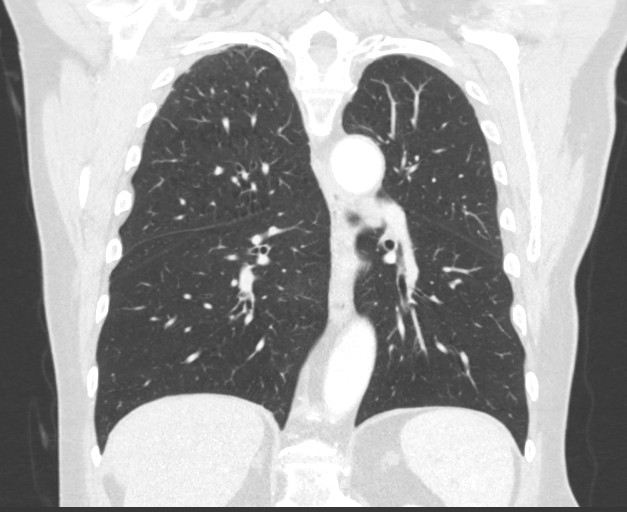

[15 of 36 positions shown; findings below may reference images not displayed]

FINDINGS: Cardiovascular: The heart size is normal. There is aortic
atherosclerosis. Calcification in the LAD, left circumflex and RCA
coronary artery is noted.

Mediastinum/Nodes: No enlarged mediastinal, hilar, or axillary lymph
nodes. Thyroid gland, trachea, and esophagus demonstrate no
significant findings.

Lungs/Pleura: Mild to moderate changes of centrilobular emphysema.
The pulmonary nodule within the medial aspect of the anterior right
upper lobe is again noted. Mean diameter 1 cm. Stable when compared
with previous exam. No new nodules.

Upper Abdomen: 11 mm cyst within the anterior liver identified.
Unchanged from previous exam. No acute abnormality.

Musculoskeletal: No aggressive lytic or sclerotic bone lesions
identified. Degenerative disc disease noted within the thoracic
spine.
IMPRESSION: 1. Pulmonary nodule in the right upper lobe is stable measuring 1
cm. Continued interval follow-up is required to confirm at least 24
months of no growth. Non-contrast chest CT at 6-12 months (from
[DATE]) is recommended.
2. Aortic Atherosclerosis ([BC]-[BC]) and Emphysema ([BC]-[BC]).
3. Multi vessel coronary artery calcifications.

## 2017-01-04 MED ORDER — IOPAMIDOL (ISOVUE-300) INJECTION 61%
75.0000 mL | Freq: Once | INTRAVENOUS | Status: AC | PRN
  Administered 2017-01-04: 75 mL via INTRAVENOUS

## 2017-01-04 NOTE — Progress Notes (Signed)
  Patient ID: Curtis Andrade., male   DOB: Nov 07, 1945, 71 y.o.   MRN: [id number]  HISTORY: He returns today in follow-up. He has no new complaints. He did complete his pulmonary function studies and he also had a repeat chest CT. He states he's not short of breath.   Vitals:   01/04/17 0922  BP: (!) 182/83  Pulse: 61  Temp: 97.6 F (36.4 C)     EXAM:    Resp: Lungs are clear bilaterally.  No respiratory distress, normal effort. Heart:  Regular without murmurs Abd:  Abdomen is soft, non distended and non tender. No masses are palpable.  There is no rebound and no guarding.  Neurological: Alert and oriented to person, place, and time. Coordination normal.  Skin: Skin is warm and dry. No rash noted. No diaphoretic. No erythema. No pallor.  Psychiatric: Normal mood and affect. Normal behavior. Judgment and thought content normal.    ASSESSMENT: I have independently reviewed the patient's CT scan. The right upper lobe nodule has persisted and measures about 1 cm in size. I have reviewed his pulmonary function studies. His FEV1 and DLCO are both 90% or greater.   PLAN:   I had a long discussion with him regarding the options. The location of the lesion makes percutaneous or endobronchial biopsy difficult. The only options I see her surgical resection or continued surveillance. I reviewed with him in detail the indications and risks of both of these options. He will think about it and give [place] a call later on today.    [name], MD

## 2017-01-04 NOTE — Patient Instructions (Signed)
Today Dr. Genevive Bi has reviewed your CT Scan with you.   Please give our office a call once you decide what option you'd like to discuss further with Dr. Genevive Bi.

## 2017-01-05 ENCOUNTER — Telehealth: Payer: Self-pay

## 2017-01-05 NOTE — Telephone Encounter (Signed)
Curtis Andrade made to patient at this time. I spoke with Curtis Andrade and told her that our surgery scheduler and Dr. Genevive Bi are working together to come up with a date for the surgery. 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 I told her that I would send the scheduler a message letting her know to contact her. Curtis Andrade verbalized understanding.

## 2017-01-07 ENCOUNTER — Ambulatory Visit (INDEPENDENT_AMBULATORY_CARE_PROVIDER_SITE_OTHER): Payer: PPO | Admitting: Cardiothoracic Surgery

## 2017-01-07 ENCOUNTER — Encounter: Payer: Self-pay | Admitting: Cardiothoracic Surgery

## 2017-01-07 VITALS — BP 174/89 | HR 76 | Temp 98.1°F | Wt 211.0 lb

## 2017-01-07 DIAGNOSIS — R918 Other nonspecific abnormal finding of lung field: Secondary | ICD-10-CM | POA: Diagnosis not present

## 2017-01-07 NOTE — Patient Instructions (Addendum)
Today we have discussed surgery for your Lobectomy. This surgery is scheduled at [facility] on 01/14/2017  with Dr. Genevive Bi.  After your visit today, you will proceed to the Pre-admission department. Pre-admit testing is located in the [employer] [place]. It is right down the hall from our office. You will see an anesthesia nurse at this visit and will have any necessary pre-op testing completed.  Please refer to your [facility]) Pre-care sheet for further information.

## 2017-01-07 NOTE — Progress Notes (Signed)
  Patient ID: Curtis Andrade., male   DOB: 11/05/1945, 71 y.o.   MRN: [id number]  HISTORY: [name] returns today in follow-up. He was seen earlier this week and was given options regarding his right upper lobe nodule. He comes back today to discuss those questions. He did have Dr. Tami Ribas take a look in his throat. There is no evidence recurrence in the larynx. He is not short of breath.   Vitals:   01/07/17 1308  BP: (!) 174/89  Pulse: 76  Temp: 98.1 F (36.7 C)     EXAM:    Resp: Lungs are clear bilaterally.  No respiratory distress, normal effort. Heart:  Regular without murmurs Abd:  Abdomen is soft, non distended and non tender. No masses are palpable.  There is no rebound and no guarding.  Neurological: Alert and oriented to person, place, and time. Coordination normal.  Skin: Skin is warm and dry. No rash noted. No diaphoretic. No erythema. No pallor.  Psychiatric: Normal mood and affect. Normal behavior. Judgment and thought content normal.    ASSESSMENT: I had a long discussion with the patient. He has a PET -1 cm right upper lobe nodule adjacent to the mediastinum. This is not amenable to biopsy. We discussed the role of thoracoscopy or thoracotomy with wedge resection or lobectomy. He understands that lobectomy is considered the standard to care for early stage lung cancer. However he is most interested in preserving as much lung function as he can.   PLAN:   After extensive discussion with the patient and his family they would like to proceed with surgery. I plan on performing a thoracoscopic examination followed by thoracotomy if absolutely necessary. We'll plan on performing a wide wedge resection only. Lobectomy will be only if absolutely necessary for malignancy they cannot resected with a wedge resection. The patient is aware of the indications and risks including risks of bleeding, infection, air leak and death.    [name], MD

## 2017-01-08 ENCOUNTER — Telehealth: Payer: Self-pay | Admitting: Cardiothoracic Surgery

## 2017-01-08 NOTE — Telephone Encounter (Signed)
Pt advised of pre op date/time and sx date. Sx: 01/14/17 with Dr Greggory Brandy thoracoscopy with possible thoracotomy with wide wedge resection possible lobectomy and pre op bronchoscopy.  Pre op: 01/11/17 @ 2:30pm--Office.   Patient made aware to call [phone], between 1-3:00pm the day before surgery, to find out what time to arrive.

## 2017-01-11 ENCOUNTER — Ambulatory Visit
Admission: RE | Admit: 2017-01-11 | Discharge: 2017-01-11 | Disposition: A | Discharge status: Home / Self Care | Payer: PPO | Source: Ambulatory Visit | Attending: Cardiothoracic Surgery | Admitting: Cardiothoracic Surgery

## 2017-01-11 ENCOUNTER — Encounter
Admission: RE | Admit: 2017-01-11 | Discharge: 2017-01-11 | Disposition: A | Discharge status: Home / Self Care | Payer: PPO | Source: Ambulatory Visit | Attending: Cardiothoracic Surgery | Admitting: Cardiothoracic Surgery

## 2017-01-11 ENCOUNTER — Other Ambulatory Visit: Payer: Self-pay | Admitting: Cardiothoracic Surgery

## 2017-01-11 DIAGNOSIS — Z01818 Encounter for other preprocedural examination: Secondary | ICD-10-CM | POA: Diagnosis not present

## 2017-01-11 DIAGNOSIS — R918 Other nonspecific abnormal finding of lung field: Secondary | ICD-10-CM | POA: Diagnosis present

## 2017-01-11 DIAGNOSIS — Z01812 Encounter for preprocedural laboratory examination: Secondary | ICD-10-CM | POA: Insufficient documentation

## 2017-01-11 DIAGNOSIS — R911 Solitary pulmonary nodule: Secondary | ICD-10-CM

## 2017-01-11 DIAGNOSIS — Y838 Other surgical procedures as the cause of abnormal reaction of the patient, or of later complication, without mention of misadventure at the time of the procedure: Secondary | ICD-10-CM | POA: Diagnosis not present

## 2017-01-11 DIAGNOSIS — C3411 Malignant neoplasm of upper lobe, right bronchus or lung: Secondary | ICD-10-CM | POA: Diagnosis present

## 2017-01-11 DIAGNOSIS — Z923 Personal history of irradiation: Secondary | ICD-10-CM | POA: Diagnosis not present

## 2017-01-11 DIAGNOSIS — Z7982 Long term (current) use of aspirin: Secondary | ICD-10-CM | POA: Diagnosis not present

## 2017-01-11 DIAGNOSIS — Z8521 Personal history of malignant neoplasm of larynx: Secondary | ICD-10-CM | POA: Diagnosis not present

## 2017-01-11 DIAGNOSIS — Z01810 Encounter for preprocedural cardiovascular examination: Secondary | ICD-10-CM | POA: Insufficient documentation

## 2017-01-11 DIAGNOSIS — Z79899 Other long term (current) drug therapy: Secondary | ICD-10-CM | POA: Diagnosis not present

## 2017-01-11 DIAGNOSIS — Z87891 Personal history of nicotine dependence: Secondary | ICD-10-CM | POA: Diagnosis not present

## 2017-01-11 DIAGNOSIS — I4891 Unspecified atrial fibrillation: Secondary | ICD-10-CM | POA: Diagnosis present

## 2017-01-11 DIAGNOSIS — J9811 Atelectasis: Secondary | ICD-10-CM | POA: Diagnosis not present

## 2017-01-11 DIAGNOSIS — Z86010 Personal history of colonic polyps: Secondary | ICD-10-CM | POA: Diagnosis not present

## 2017-01-11 DIAGNOSIS — I97191 Other postprocedural cardiac functional disturbances following other surgery: Secondary | ICD-10-CM | POA: Diagnosis not present

## 2017-01-11 DIAGNOSIS — Z01811 Encounter for preprocedural respiratory examination: Secondary | ICD-10-CM

## 2017-01-11 DIAGNOSIS — Z4682 Encounter for fitting and adjustment of non-vascular catheter: Secondary | ICD-10-CM | POA: Diagnosis not present

## 2017-01-11 LAB — CBC
HCT: 45.8 % (ref 40.0–52.0)
HEMOGLOBIN: 15.4 g/dL (ref 13.0–18.0)
MCH: 32.3 pg (ref 26.0–34.0)
MCHC: 33.6 g/dL (ref 32.0–36.0)
MCV: 96.1 fL (ref 80.0–100.0)
PLATELETS: 167 10*3/uL (ref 150–440)
RBC: 4.76 MIL/uL (ref 4.40–5.90)
RDW: 14.1 % (ref 11.5–14.5)
WBC: 6.7 10*3/uL (ref 3.8–10.6)

## 2017-01-11 LAB — COMPREHENSIVE METABOLIC PANEL
ALBUMIN: 4.3 g/dL (ref 3.5–5.0)
ALK PHOS: 49 U/L (ref 38–126)
ALT: 25 U/L (ref 17–63)
ANION GAP: 10 (ref 5–15)
AST: 33 U/L (ref 15–41)
BUN: 15 mg/dL (ref 6–20)
CHLORIDE: 102 mmol/L (ref 101–111)
CO2: 28 mmol/L (ref 22–32)
CREATININE: 1.08 mg/dL (ref 0.61–1.24)
Calcium: 9 mg/dL (ref 8.9–10.3)
GFR calc Af Amer: >60 mL/min (ref >60)
GFR calc non Af Amer: >60 mL/min (ref >60)
GLUCOSE: 94 mg/dL (ref 65–99)
Potassium: 3.4 mmol/L — ABNORMAL LOW (ref 3.5–5.1)
SODIUM: 140 mmol/L (ref 135–145)
TOTAL PROTEIN: 7.4 g/dL (ref 6.5–8.1)
Total Bilirubin: 0.9 mg/dL (ref 0.3–1.2)

## 2017-01-11 LAB — PROTIME-INR
INR: 0.97
PROTHROMBIN TIME: 12.8 s (ref 11.4–15.2)

## 2017-01-11 LAB — SURGICAL PCR SCREEN
MRSA, PCR: NEGATIVE
STAPHYLOCOCCUS AUREUS: NEGATIVE

## 2017-01-11 LAB — APTT: APTT: 31 s (ref 24–36)

## 2017-01-11 IMAGING — CR DG CHEST 2V
2 series · 2 of 2 positions shown · non-contrast
Comparison: Chest CT [DATE].  PET-CT [DATE], and earlier.

CLINICAL DATA: 71-year-old male preoperative study for right lung
surgery.

EXAM:
CHEST  2 VIEW

[chest pa]
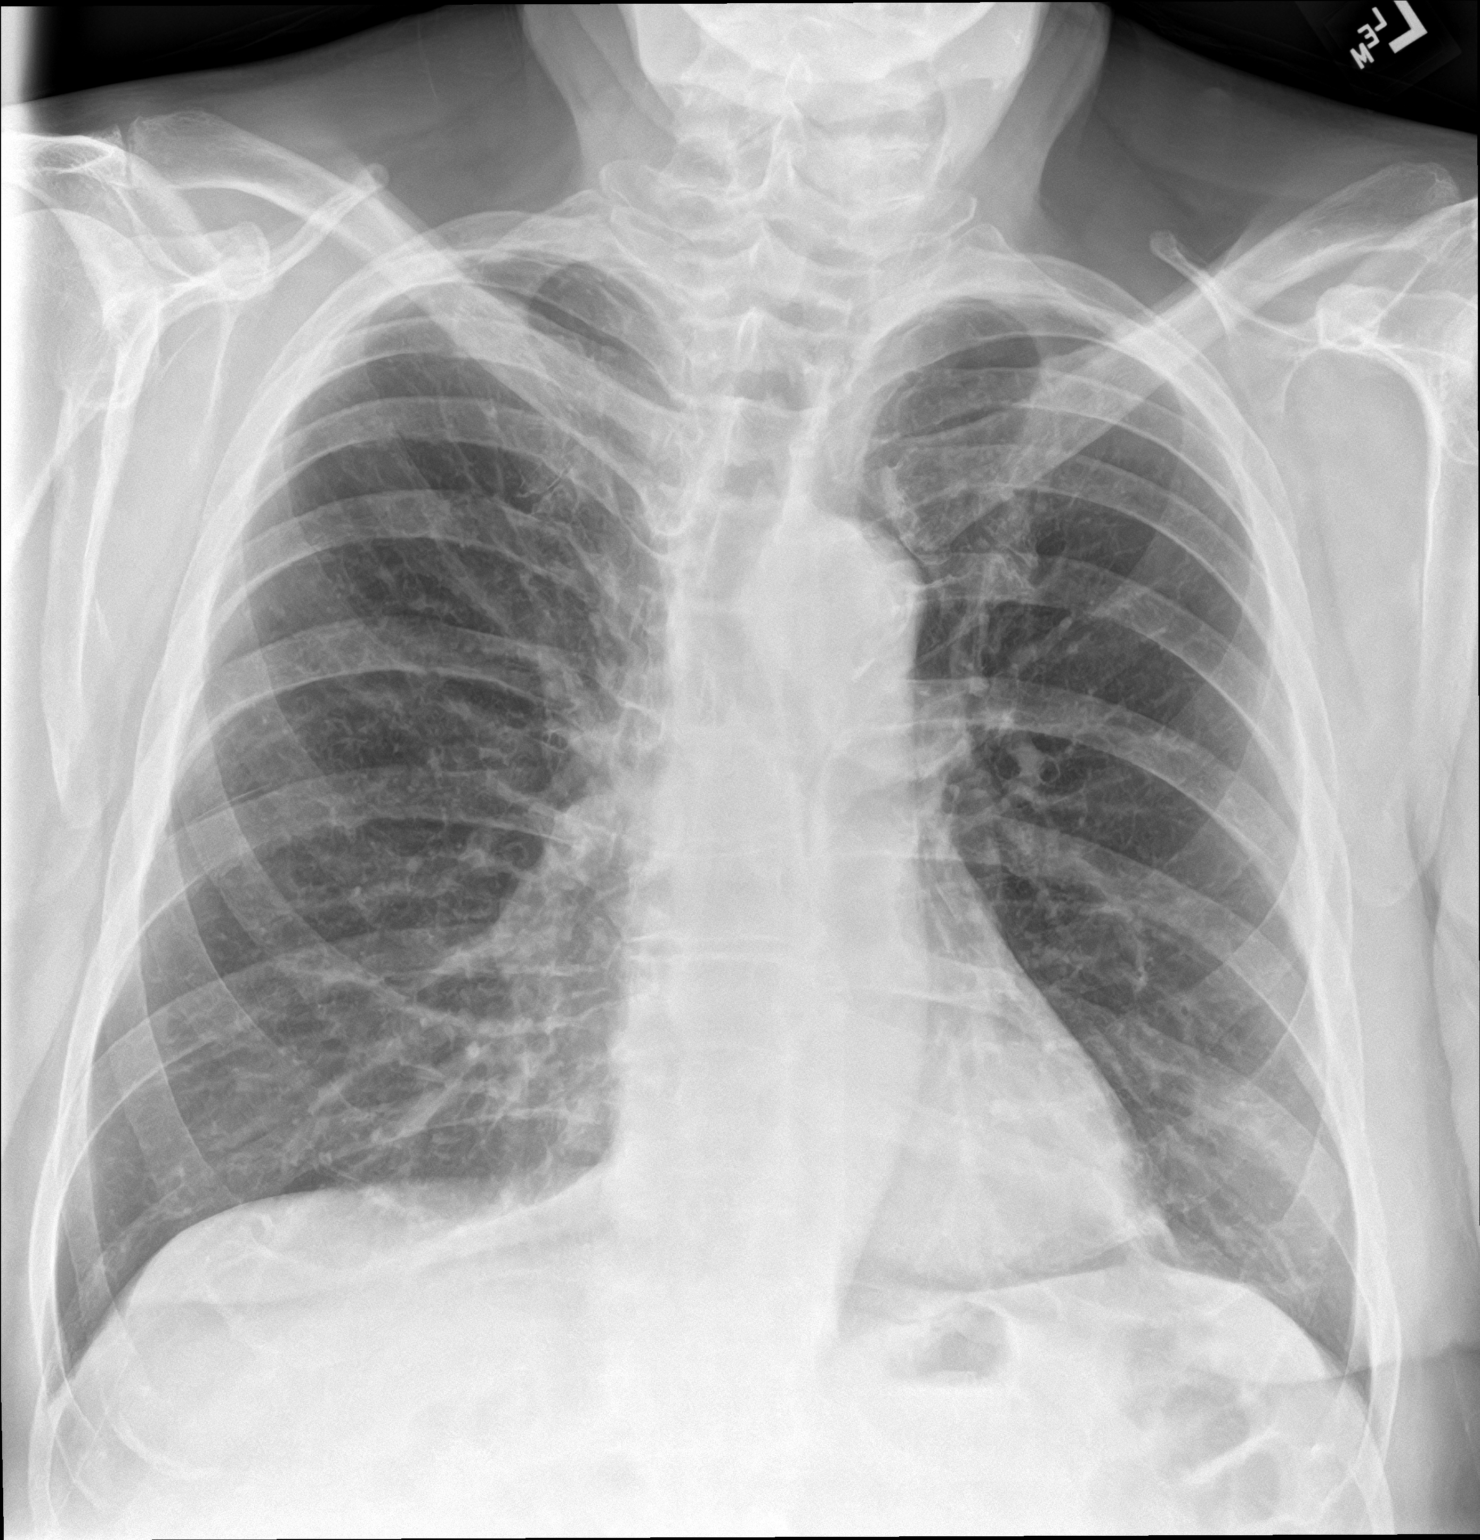

[chest lat]
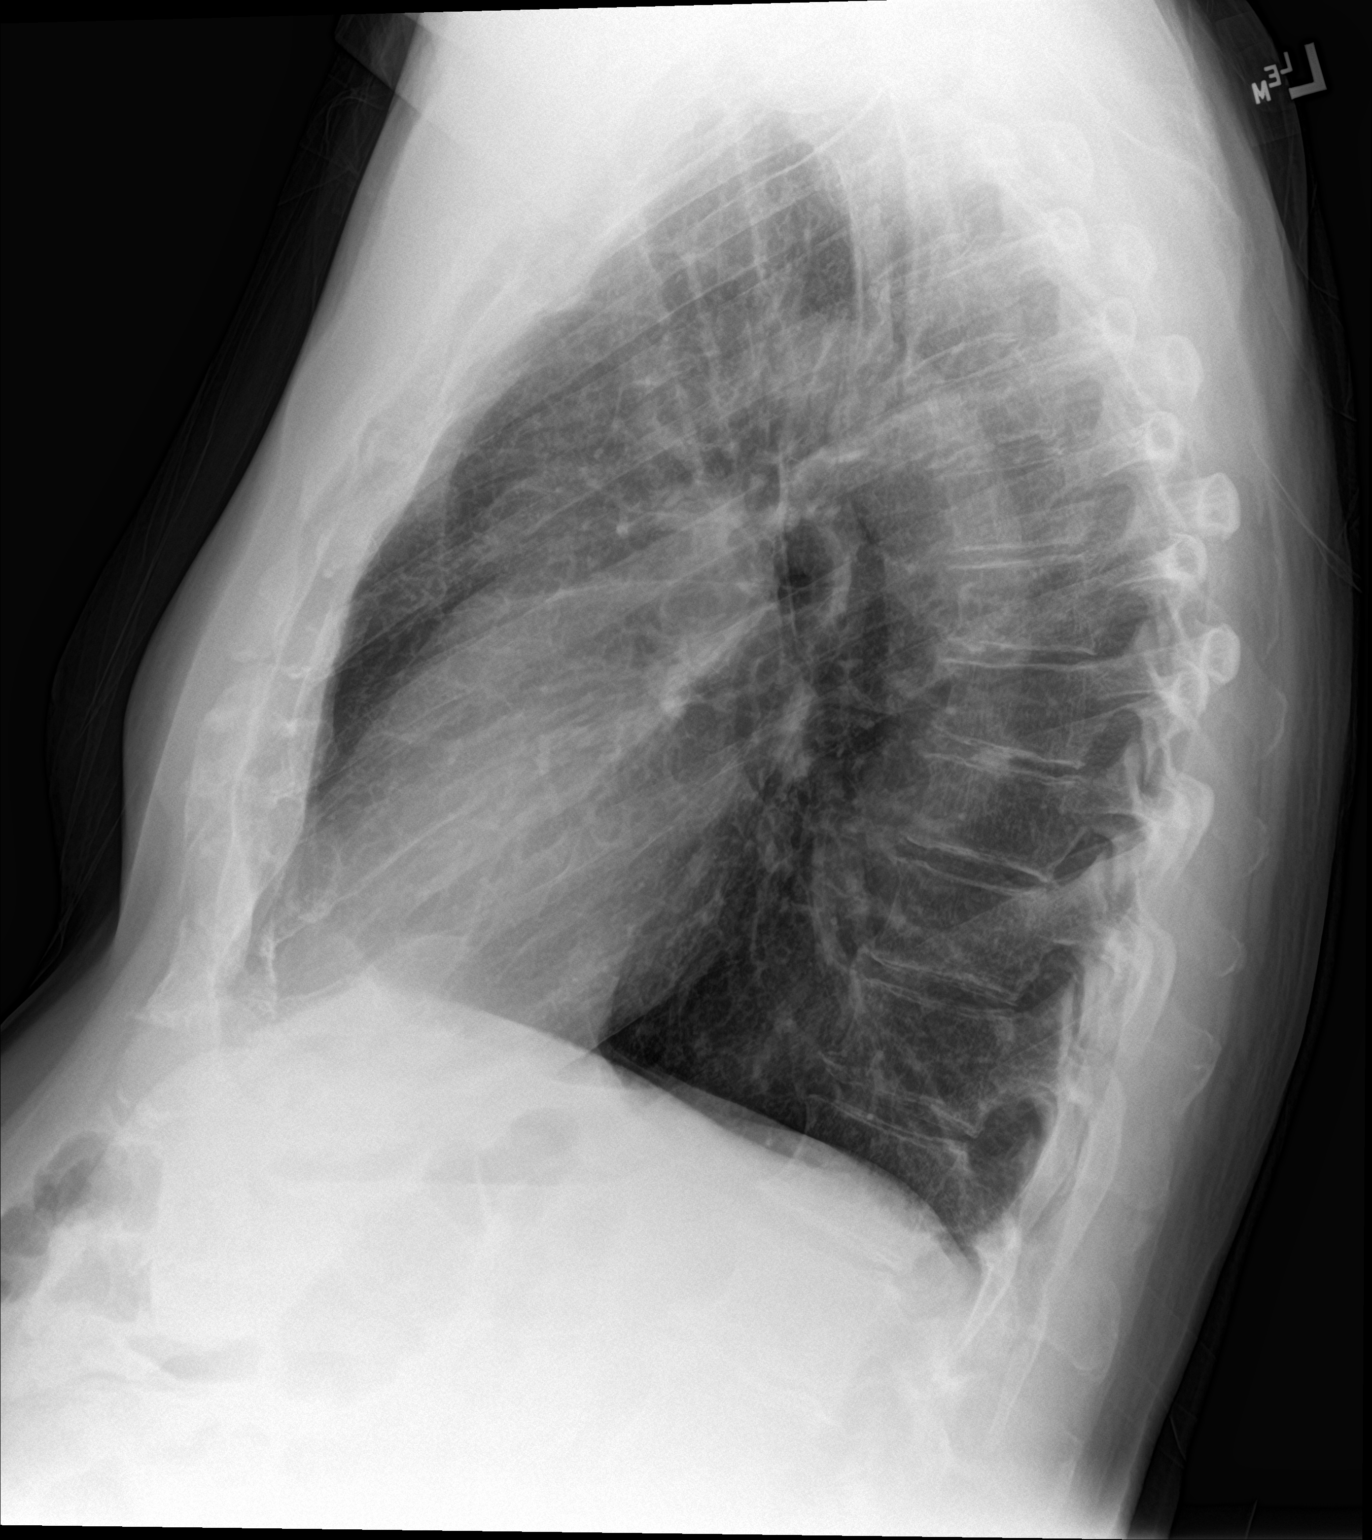

[2 of 2 positions shown; findings below may reference images not displayed]

FINDINGS: Stable lung volumes. Mediastinal contours remain normal. The medial
anterior 10 mm lung nodule is poorly visible radiographically.
Stable lung parenchyma with mild coarse increased interstitial
markings bilaterally. No pulmonary edema or pleural effusion. No new
pulmonary opacity. No acute osseous abnormality identified. Negative
visible bowel gas pattern.
IMPRESSION: 1. Poor radiographic visualization of the medial and anterior right
upper lobe lung nodule.
2.  No acute cardiopulmonary abnormality.

## 2017-01-11 NOTE — Patient Instructions (Signed)
Your procedure is scheduled on: Thursday Oct. 18, 2018. Report to Same Day Surgery. To find out your arrival time please call [phone] between 1PM - 3PM on Wednesday Jan 13, 2017.  Remember: Instructions that are not followed completely may result in serious medical risk, up to and including death, or upon the discretion of your surgeon and anesthesiologist your surgery may need to be rescheduled.     _X__ 1. Do not eat food after midnight the night before your procedure.                 No gum chewing or hard candies. You may drink clear liquids up to 2 hours                 before you are scheduled to arrive for your surgery- DO not drink clear                 liquids within 2 hours of the start of your surgery.                 Clear Liquids include:  water, apple juice without pulp, clear carbohydrate                 drink such as Clearfast of Gartorade, Black Coffee or Tea (Do not add                 anything to coffee or tea).     ___ 2.  No Alcohol for 24 hours before or after surgery.   ___ 3.  Do Not Smoke or use e-cigarettes For 24 Hours Prior to Your Surgery.                 Do not use any chewable tobacco products for at least 6 hours prior to                 surgery.  ____  4.  Bring all medications with you on the day of surgery if instructed.   __x__  5.  Notify your doctor if there is any change in your medical condition      (cold, fever, infections).     Do not wear jewelry, make-up, hairpins, clips or nail polish. Do not wear lotions, powders, or perfumes. Do not shave 48 hours prior to surgery. Men may shave face and neck. Do not bring valuables to the hospital.    [facility] is not responsible for any belongings or valuables.  Contacts, dentures or bridgework may not be worn into surgery. Leave your suitcase in the car. After surgery it may be brought to your room. For patients admitted to the hospital, discharge time is determined  by your treatment team.   Patients discharged the day of surgery will not be allowed to drive home.   Please read over the following fact sheets that you were given:   Preparing for Surgery    __x__ Take these medicines the morning of surgery with A SIP OF WATER:    1. omeprazole (PRILOSEC)    ____ Fleet Enema (as directed)   __x__ Use CHG Soap as directed  ____ Use inhalers on the day of surgery  ____ Stop metformin 2 days prior to surgery    ____ Take 1/2 of usual insulin dose the night before surgery. No insulin the morning          of surgery.   ____ aspirin already stopped.  _x___ Stop Anti-inflammatories:naproxen sodium (ANAPROX)  Now.  OK to take Tylenol.   _x_ Stop supplements:ASHWAGANDHA, folic acid (FOLVITE),   Glucosamine-Chondroitin + Omega-3 until after  surgery.    ____ Bring C-Pap to the hospital.

## 2017-01-11 NOTE — Pre-Procedure Instructions (Addendum)
Spoke with Dr. 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  Dr. Amie Critchley wants to send to pt's PCP to optimize for surgery on 01/14/17 if possible, otherwise we will proceed with surgery.  Notified Angie at Dr. Soyla Dryer office, will fax the request for optimizing pt for surgery to Angie and pt's PCP.

## 2017-01-12 NOTE — Pre-Procedure Instructions (Signed)
Fax forOptimize pt for upcoming surgery did not go thru to Dr. Barbarann Ehlers office, refaxed this am.  Martin Majestic thru.

## 2017-01-13 MED ORDER — DEXTROSE 5 % IV SOLN
1.5000 g | INTRAVENOUS | Status: AC
  Administered 2017-01-14: 1.5 g via INTRAVENOUS
  Filled 2017-01-13: qty 1.5

## 2017-01-14 ENCOUNTER — Inpatient Hospital Stay: Payer: PPO | Admitting: Certified Registered"

## 2017-01-14 ENCOUNTER — Inpatient Hospital Stay
Admission: RE | Admit: 2017-01-14 | Discharge: 2017-01-18 | DRG: 164 | Disposition: A | Discharge status: Home / Self Care | Payer: PPO | Source: Ambulatory Visit | Attending: Cardiothoracic Surgery | Admitting: Cardiothoracic Surgery

## 2017-01-14 ENCOUNTER — Inpatient Hospital Stay: Payer: PPO

## 2017-01-14 ENCOUNTER — Encounter
Admission: RE | Disposition: A | Discharge status: Home / Self Care | Payer: Self-pay | Source: Ambulatory Visit | Attending: Cardiothoracic Surgery

## 2017-01-14 ENCOUNTER — Encounter: Payer: Self-pay | Admitting: *Deleted

## 2017-01-14 DIAGNOSIS — I4891 Unspecified atrial fibrillation: Secondary | ICD-10-CM | POA: Diagnosis present

## 2017-01-14 DIAGNOSIS — Z7982 Long term (current) use of aspirin: Secondary | ICD-10-CM

## 2017-01-14 DIAGNOSIS — Z86010 Personal history of colonic polyps: Secondary | ICD-10-CM | POA: Diagnosis not present

## 2017-01-14 DIAGNOSIS — Z87891 Personal history of nicotine dependence: Secondary | ICD-10-CM | POA: Diagnosis not present

## 2017-01-14 DIAGNOSIS — Z923 Personal history of irradiation: Secondary | ICD-10-CM | POA: Diagnosis not present

## 2017-01-14 DIAGNOSIS — I97191 Other postprocedural cardiac functional disturbances following other surgery: Secondary | ICD-10-CM | POA: Diagnosis not present

## 2017-01-14 DIAGNOSIS — Z8521 Personal history of malignant neoplasm of larynx: Secondary | ICD-10-CM | POA: Diagnosis not present

## 2017-01-14 DIAGNOSIS — C3411 Malignant neoplasm of upper lobe, right bronchus or lung: Principal | ICD-10-CM | POA: Diagnosis present

## 2017-01-14 DIAGNOSIS — Z79899 Other long term (current) drug therapy: Secondary | ICD-10-CM | POA: Diagnosis not present

## 2017-01-14 DIAGNOSIS — Z01818 Encounter for other preprocedural examination: Secondary | ICD-10-CM | POA: Diagnosis not present

## 2017-01-14 DIAGNOSIS — Y838 Other surgical procedures as the cause of abnormal reaction of the patient, or of later complication, without mention of misadventure at the time of the procedure: Secondary | ICD-10-CM | POA: Diagnosis not present

## 2017-01-14 DIAGNOSIS — Z09 Encounter for follow-up examination after completed treatment for conditions other than malignant neoplasm: Secondary | ICD-10-CM

## 2017-01-14 DIAGNOSIS — R0602 Shortness of breath: Secondary | ICD-10-CM

## 2017-01-14 DIAGNOSIS — R918 Other nonspecific abnormal finding of lung field: Secondary | ICD-10-CM | POA: Diagnosis present

## 2017-01-14 HISTORY — PX: THORACOTOMY: SHX5074

## 2017-01-14 HISTORY — DX: Malignant neoplasm of upper lobe, right bronchus or lung: C34.11

## 2017-01-14 LAB — CBC WITH DIFFERENTIAL/PLATELET
Basophils Absolute: 0.1 10*3/uL (ref 0–0.1)
Basophils Relative: 1 %
Eosinophils Absolute: 0 10*3/uL (ref 0–0.7)
Eosinophils Relative: 0 %
HCT: 46.3 % (ref 40.0–52.0)
Hemoglobin: 15.5 g/dL (ref 13.0–18.0)
Lymphocytes Relative: 4 %
Lymphs Abs: 0.5 10*3/uL — ABNORMAL LOW (ref 1.0–3.6)
MCH: 32 pg (ref 26.0–34.0)
MCHC: 33.4 g/dL (ref 32.0–36.0)
MCV: 95.9 fL (ref 80.0–100.0)
Monocytes Absolute: 0.3 10*3/uL (ref 0.2–1.0)
Monocytes Relative: 3 %
Neutro Abs: 12.2 10*3/uL — ABNORMAL HIGH (ref 1.4–6.5)
Neutrophils Relative %: 92 %
Platelets: 150 10*3/uL (ref 150–440)
RBC: 4.83 MIL/uL (ref 4.40–5.90)
RDW: 14.4 % (ref 11.5–14.5)
WBC: 13.1 10*3/uL — ABNORMAL HIGH (ref 3.8–10.6)

## 2017-01-14 LAB — BASIC METABOLIC PANEL
Anion gap: 7 (ref 5–15)
BUN: 16 mg/dL (ref 6–20)
CO2: 27 mmol/L (ref 22–32)
Calcium: 8.6 mg/dL — ABNORMAL LOW (ref 8.9–10.3)
Chloride: 104 mmol/L (ref 101–111)
Creatinine, Ser: 1.15 mg/dL (ref 0.61–1.24)
GFR calc Af Amer: >60 mL/min (ref >60)
GFR calc non Af Amer: >60 mL/min (ref >60)
Glucose, Bld: 126 mg/dL — ABNORMAL HIGH (ref 65–99)
Potassium: 3.9 mmol/L (ref 3.5–5.1)
Sodium: 138 mmol/L (ref 135–145)

## 2017-01-14 LAB — ABO/RH: ABO/RH(D): O POS

## 2017-01-14 LAB — PREPARE RBC (CROSSMATCH)

## 2017-01-14 LAB — GLUCOSE, CAPILLARY: Glucose-Capillary: 95 mg/dL (ref 65–99)

## 2017-01-14 LAB — MRSA PCR SCREENING: MRSA by PCR: NEGATIVE

## 2017-01-14 IMAGING — DX DG CHEST 1V PORT
1 series · 1 of 1 positions shown · non-contrast
Comparison: Chest x-ray of [DATE]

CLINICAL DATA: Status post right-sided thoracoscopy with wide red
wedge resection preoperative examination prior to bronchoscopy.

EXAM:
PORTABLE CHEST 1 VIEW

[chest ap]
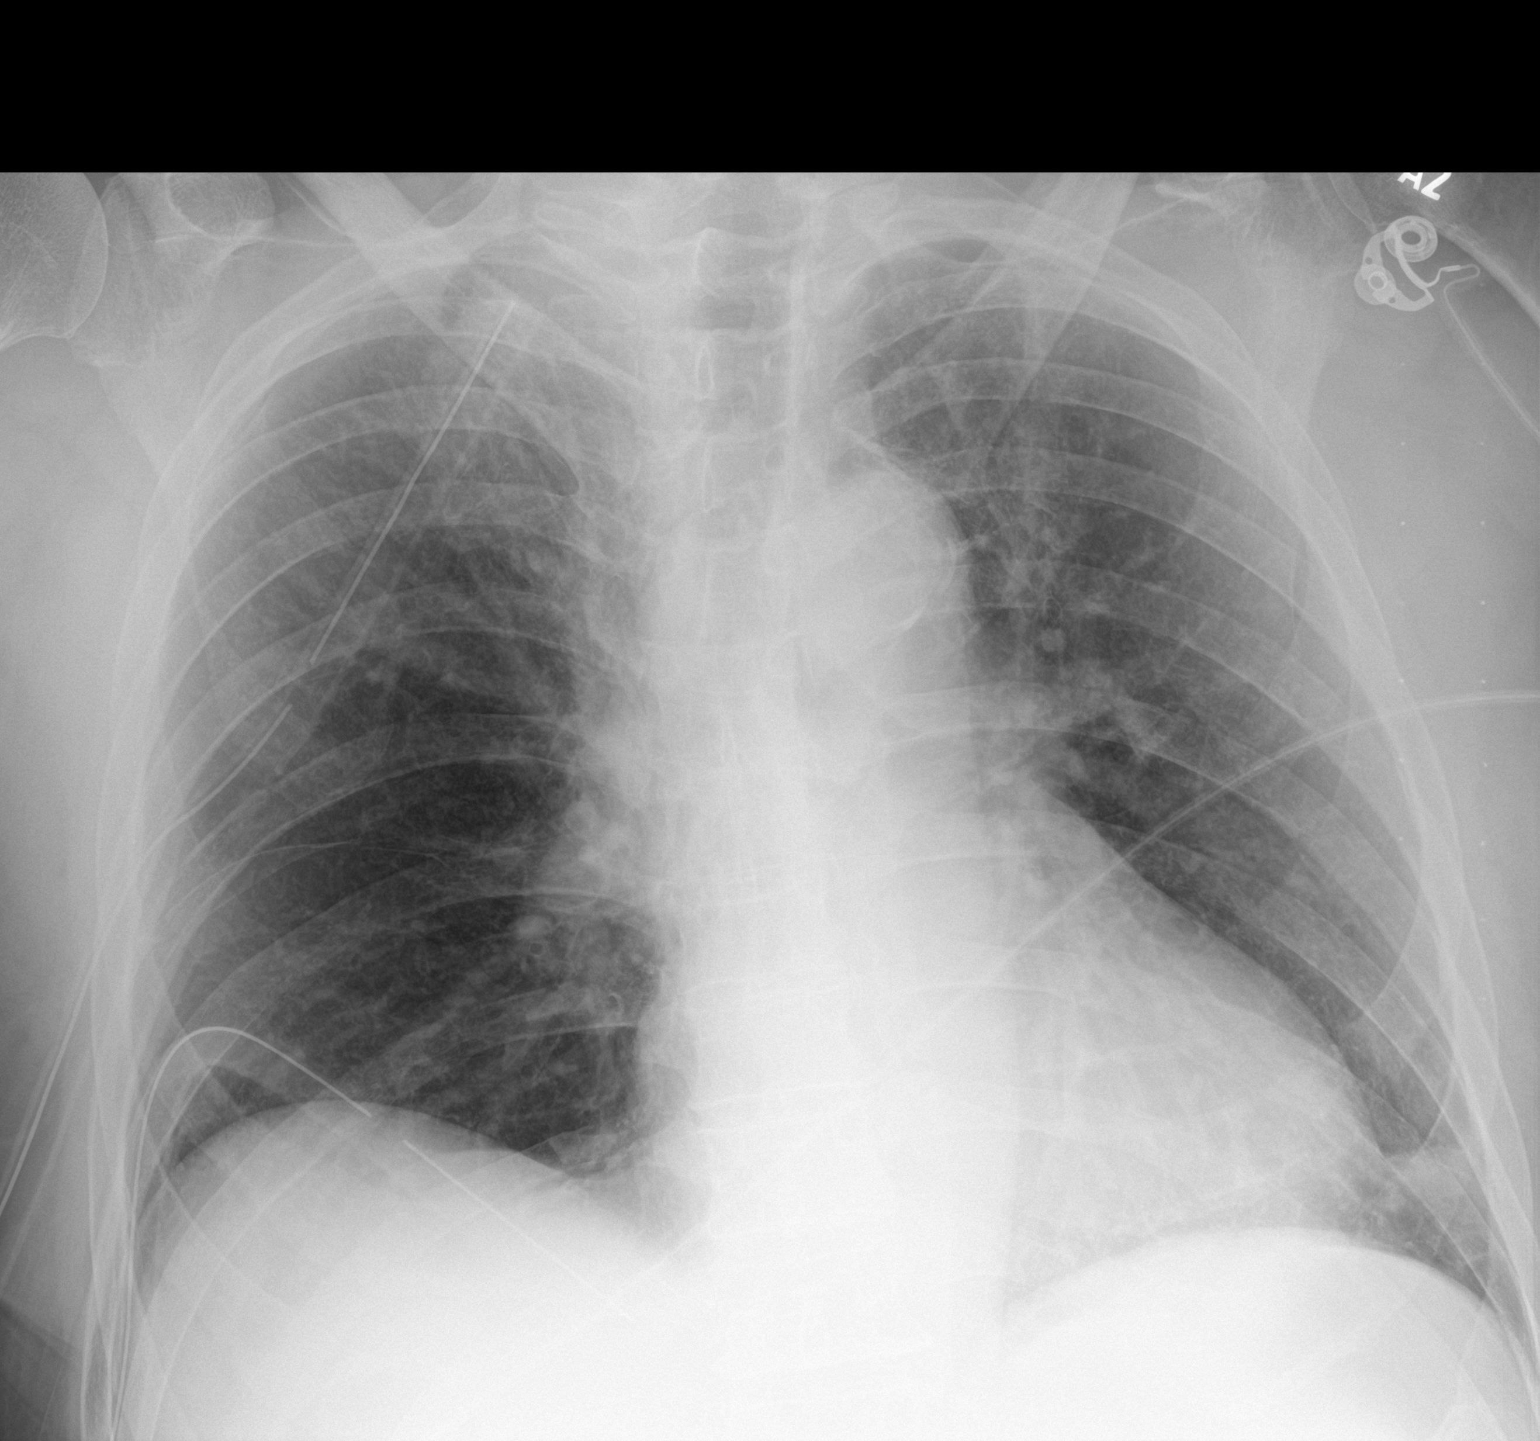

[1 of 1 positions shown; findings below may reference images not displayed]

FINDINGS: There remains an approximately 5% or less right-sided pneumothorax.
There are 2 right-sided chest tubes present. The tip of the upper
tube lies in the apex overlying the posteromedial aspect of the
third rib. The lower tube projects in the medial cardio phrenic
angle. There is patchy density at the left lung base. The heart is
top-normal in size. The pulmonary vascularity is mildly engorged.
There is calcification in the wall of the aortic arch. The bony
thorax exhibits no acute abnormality.
IMPRESSION: 5% or less right apical pneumothorax. Two chest tubes are present.
Patchy density lateral to the cardiac apex likely reflects
subsegmental atelectasis.

Top-normal cardiac size with mild central pulmonary vascular
prominence.

Thoracic aortic atherosclerosis.

## 2017-01-14 SURGERY — THORACOTOMY, MAJOR
Anesthesia: General | Laterality: Right | Wound class: Clean Contaminated

## 2017-01-14 MED ORDER — ROCURONIUM BROMIDE 50 MG/5ML IV SOLN
INTRAVENOUS | Status: AC
  Filled 2017-01-14: qty 1

## 2017-01-14 MED ORDER — ONDANSETRON HCL 4 MG/2ML IJ SOLN
4.0000 mg | Freq: Four times a day (QID) | INTRAMUSCULAR | Status: DC | PRN
  Administered 2017-01-17 – 2017-01-18 (×2): 4 mg via INTRAVENOUS
  Filled 2017-01-14 (×5): qty 2

## 2017-01-14 MED ORDER — GLYCOPYRROLATE 0.2 MG/ML IJ SOLN
INTRAMUSCULAR | Status: AC
  Filled 2017-01-14: qty 4

## 2017-01-14 MED ORDER — NEOSTIGMINE METHYLSULFATE 10 MG/10ML IV SOLN
INTRAVENOUS | Status: AC
  Filled 2017-01-14: qty 1

## 2017-01-14 MED ORDER — ACETAMINOPHEN 10 MG/ML IV SOLN
INTRAVENOUS | Status: AC
  Filled 2017-01-14: qty 100

## 2017-01-14 MED ORDER — DEXTROSE 5 % IV SOLN
1.5000 g | Freq: Two times a day (BID) | INTRAVENOUS | Status: AC
  Administered 2017-01-15 (×2): 1.5 g via INTRAVENOUS
  Filled 2017-01-14 (×2): qty 1.5

## 2017-01-14 MED ORDER — ROCURONIUM BROMIDE 100 MG/10ML IV SOLN
INTRAVENOUS | Status: DC | PRN
  Administered 2017-01-14 (×2): 50 mg via INTRAVENOUS

## 2017-01-14 MED ORDER — MORPHINE SULFATE (PF) 2 MG/ML IV SOLN: 1.0000 mg | INTRAVENOUS | Status: DC | PRN

## 2017-01-14 MED ORDER — TRAMADOL HCL 50 MG PO TABS
50.0000 mg | ORAL_TABLET | Freq: Four times a day (QID) | ORAL | Status: DC
  Administered 2017-01-14: 50 mg via ORAL
  Administered 2017-01-15 (×2): 100 mg via ORAL
  Filled 2017-01-14 (×2): qty 2
  Filled 2017-01-14: qty 1

## 2017-01-14 MED ORDER — OXYCODONE HCL 5 MG PO TABS
5.0000 mg | ORAL_TABLET | ORAL | Status: DC | PRN
  Administered 2017-01-14: 5 mg via ORAL
  Filled 2017-01-14: qty 2

## 2017-01-14 MED ORDER — ORAL CARE MOUTH RINSE
15.0000 mL | Freq: Two times a day (BID) | OROMUCOSAL | Status: DC
  Administered 2017-01-14 – 2017-01-16 (×5): 15 mL via OROMUCOSAL

## 2017-01-14 MED ORDER — SODIUM CHLORIDE 0.9 % IV SOLN: Freq: Once | INTRAVENOUS | Status: DC

## 2017-01-14 MED ORDER — BUPIVACAINE-EPINEPHRINE (PF) 0.25% -1:200000 IJ SOLN
INTRAMUSCULAR | Status: DC | PRN
  Administered 2017-01-14: 30 mL

## 2017-01-14 MED ORDER — FENTANYL CITRATE (PF) 250 MCG/5ML IJ SOLN
INTRAMUSCULAR | Status: AC
  Filled 2017-01-14: qty 5

## 2017-01-14 MED ORDER — PROPOFOL 10 MG/ML IV BOLUS
INTRAVENOUS | Status: AC
  Filled 2017-01-14: qty 20

## 2017-01-14 MED ORDER — MIDAZOLAM HCL 2 MG/2ML IJ SOLN
INTRAMUSCULAR | Status: AC
  Filled 2017-01-14: qty 2

## 2017-01-14 MED ORDER — SODIUM CHLORIDE 0.9 % IJ SOLN
INTRAMUSCULAR | Status: AC
  Filled 2017-01-14: qty 50

## 2017-01-14 MED ORDER — LACTATED RINGERS IV SOLN
INTRAVENOUS | Status: DC | PRN
  Administered 2017-01-14: 12:00:00 via INTRAVENOUS

## 2017-01-14 MED ORDER — DEXAMETHASONE SODIUM PHOSPHATE 10 MG/ML IJ SOLN
INTRAMUSCULAR | Status: AC
  Filled 2017-01-14: qty 1

## 2017-01-14 MED ORDER — ONDANSETRON HCL 4 MG/2ML IJ SOLN
INTRAMUSCULAR | Status: AC
  Filled 2017-01-14: qty 2

## 2017-01-14 MED ORDER — FENTANYL CITRATE (PF) 100 MCG/2ML IJ SOLN
INTRAMUSCULAR | Status: DC | PRN
  Administered 2017-01-14 (×2): 50 ug via INTRAVENOUS

## 2017-01-14 MED ORDER — PHENYLEPHRINE HCL 10 MG/ML IJ SOLN
INTRAMUSCULAR | Status: DC | PRN
  Administered 2017-01-14 (×2): 200 ug via INTRAVENOUS

## 2017-01-14 MED ORDER — OXYCODONE HCL 5 MG/5ML PO SOLN: 5.0000 mg | Freq: Once | ORAL | Status: DC | PRN

## 2017-01-14 MED ORDER — BUPIVACAINE LIPOSOME 1.3 % IJ SUSP
INTRAMUSCULAR | Status: AC
  Filled 2017-01-14: qty 20

## 2017-01-14 MED ORDER — OXYCODONE HCL 5 MG PO TABS: 5.0000 mg | ORAL_TABLET | Freq: Once | ORAL | Status: DC | PRN

## 2017-01-14 MED ORDER — DEXTROSE-NACL 5-0.45 % IV SOLN
INTRAVENOUS | Status: DC
  Administered 2017-01-14 – 2017-01-15 (×2): via INTRAVENOUS

## 2017-01-14 MED ORDER — MEPERIDINE HCL 50 MG/ML IJ SOLN: 6.2500 mg | INTRAMUSCULAR | Status: DC | PRN

## 2017-01-14 MED ORDER — LIDOCAINE HCL (PF) 2 % IJ SOLN
INTRAMUSCULAR | Status: AC
  Filled 2017-01-14: qty 10

## 2017-01-14 MED ORDER — LACTATED RINGERS IV SOLN
INTRAVENOUS | Status: DC
  Administered 2017-01-14 (×2): via INTRAVENOUS

## 2017-01-14 MED ORDER — KCL IN DEXTROSE-NACL 20-5-0.45 MEQ/L-%-% IV SOLN
INTRAVENOUS | Status: DC
  Filled 2017-01-14 (×2): qty 1000

## 2017-01-14 MED ORDER — SEVOFLURANE IN SOLN
RESPIRATORY_TRACT | Status: AC
  Filled 2017-01-14: qty 250

## 2017-01-14 MED ORDER — BUPIVACAINE LIPOSOME 1.3 % IJ SUSP
INTRAMUSCULAR | Status: DC | PRN
  Administered 2017-01-14: 40 mL

## 2017-01-14 MED ORDER — DEXAMETHASONE SODIUM PHOSPHATE 10 MG/ML IJ SOLN
INTRAMUSCULAR | Status: DC | PRN
  Administered 2017-01-14: 10 mg via INTRAVENOUS

## 2017-01-14 MED ORDER — FENTANYL CITRATE (PF) 100 MCG/2ML IJ SOLN
INTRAMUSCULAR | Status: AC
  Administered 2017-01-14: 25 ug via INTRAVENOUS
  Filled 2017-01-14: qty 2

## 2017-01-14 MED ORDER — PROPOFOL 10 MG/ML IV BOLUS
INTRAVENOUS | Status: DC | PRN
  Administered 2017-01-14: 150 mg via INTRAVENOUS

## 2017-01-14 MED ORDER — PANTOPRAZOLE SODIUM 40 MG PO TBEC
80.0000 mg | DELAYED_RELEASE_TABLET | Freq: Every day | ORAL | Status: DC
  Administered 2017-01-15 – 2017-01-17 (×3): 80 mg via ORAL
  Filled 2017-01-14 (×4): qty 2

## 2017-01-14 MED ORDER — ONDANSETRON HCL 4 MG/2ML IJ SOLN
INTRAMUSCULAR | Status: DC | PRN
  Administered 2017-01-14: 4 mg via INTRAVENOUS

## 2017-01-14 MED ORDER — LIDOCAINE HCL (CARDIAC) 20 MG/ML IV SOLN
INTRAVENOUS | Status: DC | PRN
  Administered 2017-01-14: 100 mg via INTRAVENOUS

## 2017-01-14 MED ORDER — ACETAMINOPHEN 10 MG/ML IV SOLN
INTRAVENOUS | Status: DC | PRN
  Administered 2017-01-14: 1000 mg via INTRAVENOUS

## 2017-01-14 MED ORDER — BISACODYL 5 MG PO TBEC
10.0000 mg | DELAYED_RELEASE_TABLET | Freq: Every day | ORAL | Status: DC
  Administered 2017-01-14 – 2017-01-17 (×3): 10 mg via ORAL
  Filled 2017-01-14 (×4): qty 2

## 2017-01-14 MED ORDER — ALBUTEROL SULFATE (2.5 MG/3ML) 0.083% IN NEBU
2.5000 mg | INHALATION_SOLUTION | RESPIRATORY_TRACT | Status: DC
  Administered 2017-01-14 – 2017-01-16 (×5): 2.5 mg via RESPIRATORY_TRACT
  Filled 2017-01-14 (×6): qty 3

## 2017-01-14 MED ORDER — FENTANYL CITRATE (PF) 100 MCG/2ML IJ SOLN
25.0000 ug | INTRAMUSCULAR | Status: DC | PRN
  Administered 2017-01-14 (×4): 25 ug via INTRAVENOUS

## 2017-01-14 MED ORDER — PROMETHAZINE HCL 25 MG/ML IJ SOLN: 6.2500 mg | INTRAMUSCULAR | Status: DC | PRN

## 2017-01-14 MED ORDER — NEOSTIGMINE METHYLSULFATE 10 MG/10ML IV SOLN
INTRAVENOUS | Status: DC | PRN
  Administered 2017-01-14: 5 mg via INTRAVENOUS

## 2017-01-14 MED ORDER — MIDAZOLAM HCL 2 MG/2ML IJ SOLN
INTRAMUSCULAR | Status: DC | PRN
  Administered 2017-01-14: 2 mg via INTRAVENOUS

## 2017-01-14 MED ORDER — GLYCOPYRROLATE 0.2 MG/ML IJ SOLN
INTRAMUSCULAR | Status: DC | PRN
  Administered 2017-01-14: .8 mg via INTRAVENOUS

## 2017-01-14 MED ORDER — BUPIVACAINE-EPINEPHRINE (PF) 0.25% -1:200000 IJ SOLN
INTRAMUSCULAR | Status: AC
  Filled 2017-01-14: qty 30

## 2017-01-14 SURGICAL SUPPLY — 64 items
BNDG COHESIVE 4X5 TAN STRL (GAUZE/BANDAGES/DRESSINGS) ×2 IMPLANT
BRONCHOSCOPE PED SLIM DISP (MISCELLANEOUS) ×2 IMPLANT
CATH  RT ANGL  28F  SOF (CATHETERS) ×1
CATH RT ANGL 28F SOF (CATHETERS) ×1 IMPLANT
CATH THOR STR 28F  SOFT WA (CATHETERS) ×1
CATH THOR STR 28F SOFT WA (CATHETERS) ×1 IMPLANT
CATH URET ROBINSON 16FR STRL (CATHETERS) IMPLANT
CHLORAPREP W/TINT 26ML (MISCELLANEOUS) ×4 IMPLANT
CONN REDUCER 3/8X3/8X3/8Y (CONNECTOR) ×2
CONNECTOR REDUCER 3/8X3/8 (MISCELLANEOUS) ×4 IMPLANT
CONNECTOR REDUCER 3/8X3/8X3/8Y (CONNECTOR) ×1 IMPLANT
CUTTER ECHEON FLEX ENDO 45 340 (ENDOMECHANICALS) ×2 IMPLANT
DEFOGGER SCOPE WARMER CLEARIFY (MISCELLANEOUS) ×2 IMPLANT
DRAIN CHEST DRY SUCT SGL (MISCELLANEOUS) ×4 IMPLANT
DRAPE C-SECTION (MISCELLANEOUS) ×2 IMPLANT
DRAPE MAG INST 16X20 L/F (DRAPES) ×2 IMPLANT
DRAPE POUCH INSTRU U-SHP 10X18 (DRAPES) ×2 IMPLANT
DRSG OPSITE POSTOP 3X4 (GAUZE/BANDAGES/DRESSINGS) ×6 IMPLANT
ELECT BLADE 6 FLAT ULTRCLN (ELECTRODE) ×2 IMPLANT
ELECT BLADE 6.5 EXT (BLADE) ×2 IMPLANT
ELECT CAUTERY BLADE TIP 2.5 (TIP) ×2
ELECT REM PT RETURN 9FT ADLT (ELECTROSURGICAL) ×2
ELECTRODE CAUTERY BLDE TIP 2.5 (TIP) ×1 IMPLANT
ELECTRODE REM PT RTRN 9FT ADLT (ELECTROSURGICAL) ×1 IMPLANT
GAUZE SPONGE 4X4 12PLY STRL (GAUZE/BANDAGES/DRESSINGS) ×2 IMPLANT
GLOVE SURG SYN 7.5  E (GLOVE) ×2
GLOVE SURG SYN 7.5 E (GLOVE) ×2 IMPLANT
GOWN STRL REUS W/ TWL LRG LVL3 (GOWN DISPOSABLE) ×4 IMPLANT
GOWN STRL REUS W/TWL LRG LVL3 (GOWN DISPOSABLE) ×4
KIT RM TURNOVER STRD PROC AR (KITS) ×2 IMPLANT
LABEL OR SOLS (LABEL) ×2 IMPLANT
LOOP RED MAXI  1X406MM (MISCELLANEOUS)
LOOP VESSEL MAXI 1X406 RED (MISCELLANEOUS) IMPLANT
MARKER SKIN DUAL TIP RULER LAB (MISCELLANEOUS) ×2 IMPLANT
NEEDLE SPNL 20GX3.5 QUINCKE YW (NEEDLE) ×2 IMPLANT
NS IRRIG 500ML POUR BTL (IV SOLUTION) ×2 IMPLANT
PACK BASIN MAJOR ARMC (MISCELLANEOUS) ×2 IMPLANT
RELOAD STAPLER GOLD 60MM (STAPLE) ×2 IMPLANT
SCISSORS METZENBAUM CVD 33 (INSTRUMENTS) IMPLANT
SPONGE KITTNER 5P (MISCELLANEOUS) ×2 IMPLANT
STAPLE ECHEON FLEX 60 POW ENDO (STAPLE) ×2 IMPLANT
STAPLER RELOAD GOLD 60MM (STAPLE) ×4
STAPLER SKIN PROX 35W (STAPLE) ×2 IMPLANT
STAPLER VASCULAR ECHELON 35 (CUTTER) IMPLANT
SUT ETHILON 3-0 FS-10 30 BLK (SUTURE) ×2
SUT ETHILON 4-0 (SUTURE)
SUT ETHILON 4-0 FS2 18XMFL BLK (SUTURE)
SUT MNCRL AB 3-0 PS2 27 (SUTURE) ×4 IMPLANT
SUT SILK 1 SH (SUTURE) ×6 IMPLANT
SUT VIC AB 0 CT1 36 (SUTURE) ×4 IMPLANT
SUT VIC AB 2-0 CT1 27 (SUTURE) ×2
SUT VIC AB 2-0 CT1 TAPERPNT 27 (SUTURE) ×2 IMPLANT
SUT VIC AB 2-0 CT2 27 (SUTURE) ×4 IMPLANT
SUT VICRYL 2 TP 1 (SUTURE) ×8 IMPLANT
SUTURE EHLN 3-0 FS-10 30 BLK (SUTURE) ×1 IMPLANT
SUTURE ETHLN 4-0 FS2 18XMF BLK (SUTURE) IMPLANT
SYR BULB IRRIG 60ML STRL (SYRINGE) ×2 IMPLANT
TAPE ADH 3 LX (MISCELLANEOUS) ×2 IMPLANT
TAPE TRANSPORE STRL 2 31045 (GAUZE/BANDAGES/DRESSINGS) ×2 IMPLANT
TRAY FOLEY W/METER SILVER 16FR (SET/KITS/TRAYS/PACK) ×2 IMPLANT
TROCAR FLEXIPATH 20X80 (ENDOMECHANICALS) IMPLANT
TROCAR FLEXIPATH THORACIC 15MM (ENDOMECHANICALS) IMPLANT
WATER STERILE IRR 1000ML POUR (IV SOLUTION) ×2 IMPLANT
YANKAUER SUCT BULB TIP FLEX NO (MISCELLANEOUS) ×2 IMPLANT

## 2017-01-14 NOTE — H&P (View-Only) (Signed)
  Patient ID: Curtis Andrade., male   DOB: 03/13/1946, 71 y.o.   MRN: [id number]  HISTORY: [name] returns today in follow-up. He was seen earlier this week and was given options regarding his right upper lobe nodule. He comes back today to discuss those questions. He did have Dr. Tami Ribas take a look in his throat. There is no evidence recurrence in the larynx. He is not short of breath.   Vitals:   01/07/17 1308  BP: (!) 174/89  Pulse: 76  Temp: 98.1 F (36.7 C)     EXAM:    Resp: Lungs are clear bilaterally.  No respiratory distress, normal effort. Heart:  Regular without murmurs Abd:  Abdomen is soft, non distended and non tender. No masses are palpable.  There is no rebound and no guarding.  Neurological: Alert and oriented to person, place, and time. Coordination normal.  Skin: Skin is warm and dry. No rash noted. No diaphoretic. No erythema. No pallor.  Psychiatric: Normal mood and affect. Normal behavior. Judgment and thought content normal.    ASSESSMENT: I had a long discussion with the patient. He has a PET -1 cm right upper lobe nodule adjacent to the mediastinum. This is not amenable to biopsy. We discussed the role of thoracoscopy or thoracotomy with wedge resection or lobectomy. He understands that lobectomy is considered the standard to care for early stage lung cancer. However he is most interested in preserving as much lung function as he can.   PLAN:   After extensive discussion with the patient and his family they would like to proceed with surgery. I plan on performing a thoracoscopic examination followed by thoracotomy if absolutely necessary. We'll plan on performing a wide wedge resection only. Lobectomy will be only if absolutely necessary for malignancy they cannot resected with a wedge resection. The patient is aware of the indications and risks including risks of bleeding, infection, air leak and death.    [name], MD

## 2017-01-14 NOTE — Anesthesia Procedure Notes (Signed)
Procedure Name: Intubation Date/Time: 01/14/2017 11:52 AM Performed by: Silvana Newness Pre-anesthesia Checklist: Patient identified, Emergency Drugs available, Suction available, Patient being monitored and Timeout performed Patient Re-evaluated:Patient Re-evaluated prior to induction Oxygen Delivery Method: Circle system utilized Preoxygenation: Pre-oxygenation with 100% oxygen Induction Type: IV induction Ventilation: Mask ventilation without difficulty Laryngoscope Size: Mac and 4 Grade View: Grade III Tube type: Oral Endobronchial tube: Double lumen EBT and EBT position confirmed by fiberoptic bronchoscope and 39 Fr Number of attempts: 1 Airway Equipment and Method: Stylet and Fiberoptic brochoscope Placement Confirmation: ETT inserted through vocal cords under direct vision,  positive ETCO2 and breath sounds checked- equal and bilateral Secured at: 31 cm Tube secured with: Tape Dental Injury: Teeth and Oropharynx as per pre-operative assessment

## 2017-01-14 NOTE — Anesthesia Preprocedure Evaluation (Signed)
Anesthesia Evaluation  Patient identified by MRN, date of birth, ID band Patient awake    Reviewed: Allergy & Precautions, NPO status , Patient's Chart, lab work & pertinent test results  History of Anesthesia Complications Negative for: history of anesthetic complications  Airway Mallampati: II  TM Distance: >3 FB Neck ROM: Full    Dental  (+) Missing   Pulmonary neg sleep apnea, neg COPD, former smoker,    breath sounds clear to auscultation- rhonchi (-) wheezing      Cardiovascular Exercise Tolerance: Good (-) hypertension(-) CAD, (-) Past MI and (-) Cardiac Stents  Rhythm:Regular Rate:Normal - Systolic murmurs and - Diastolic murmurs    Neuro/Psych negative neurological ROS  negative psych ROS   GI/Hepatic negative GI ROS, Neg liver ROS,   Endo/Other  negative endocrine ROSneg diabetes  Renal/GU negative Renal ROS     Musculoskeletal negative musculoskeletal ROS (+)   Abdominal (+) - obese,   Peds  Hematology negative hematology ROS (+)   Anesthesia Other Findings Past Medical History: 2010: Cancer ([place])     Comment:  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              Baruch Gouty and Dr. Nadeen Landau 1998: Phlebitis 11/27/2016: Psoriasis, unspecified   Reproductive/Obstetrics                             Anesthesia Physical Anesthesia Plan  ASA: II  Anesthesia Plan: General   Post-op Pain Management:    Induction: Intravenous  PONV Risk Score and Plan: 1 and Ondansetron and Dexamethasone  Airway Management Planned: Double Lumen EBT  Additional Equipment:   Intra-op Plan:   Post-operative Plan:  Extubation in OR  Informed Consent: I have reviewed the patients History and Physical, chart, labs and discussed the procedure including the risks, benefits and alternatives for the proposed anesthesia with the patient or authorized representative who has indicated his/her understanding and acceptance.   Dental advisory given  Plan Discussed with: CRNA and Anesthesiologist  Anesthesia Plan Comments: (Discussed possible arterial line for hemodynamic monitoring)        Anesthesia Quick Evaluation

## 2017-01-14 NOTE — Transfer of Care (Signed)
Immediate Anesthesia Transfer of Care Note  Patient: Curtis Andrade.  Procedure(s) Performed: RIGHT THORACOSCOPYWITH WIDE WEDGE RESECTION,  PREOP BROCHOSCOPY (Right )  Patient Location: PACU  Anesthesia Type:General  Level of Consciousness: awake, oriented and patient cooperative  Airway & Oxygen Therapy: Patient Spontanous Breathing and Patient connected to face mask oxygen  Post-op Assessment: Report given to RN, Post -op Vital signs reviewed and stable and Patient moving all extremities X 4  Post vital signs: Reviewed and stable  Last Vitals:  Vitals:   01/14/17 0959  BP: (!) 168/87  Pulse: 70  Resp: 18  Temp: 36.5 C  SpO2: 100%    Last Pain: There were no vitals filed for this visit.       Complications: No apparent anesthesia complications

## 2017-01-14 NOTE — Consult Note (Signed)
Name: Curtis Andrade. MRN: [id number] DOB: [birth year]    ADMISSION DATE:  01/14/2017 CONSULTATION DATE: 01/14/2017  REFERRING MD : Dr. Genevive Bi   CHIEF COMPLAINT: s/p Right Thoracoscopy with wedge resection   BRIEF PATIENT DESCRIPTION:  71 yo male admitted 10/18 s/p right thoracoscopy with wedge resection right upper lobe lung mass   SIGNIFICANT EVENTS  10/18-Pt admitted to ICU   STUDIES:  None   HISTORY OF PRESENT ILLNESS:   This is a 71 yo male with a PMH of Head and Neck Cancer who was found to have a 1 cm right upper lobe nodule adjacent to the mediastinum.  Following repeat CT Chest and PET scan revealing no change in lung nodule and due to hx of cancer thoracic surgeon Dr. Genevive Bi recommended a resection. Therefore, on 10/18 the pt presented to [facility] for an elective right thoracoscopy with wedge resection of right upper lobe mass.  He was subsequently admitted to ICU post procedure for further workup and treatment PCCM consulted.  PAST MEDICAL HISTORY :   has a past medical history of Cancer ([place]) (2010); Cataract cortical, senile (11/27/2016); Colon polyp (2013); Hemorrhoids; Laryngeal cancer ([place]) (11/27/2016); Phlebitis (1998); and Psoriasis, unspecified (11/27/2016).  has a past surgical history that includes Colonoscopy (2013); Throat surgery (2010); Colonoscopy with propofol (N/A, 12/23/2016); and Hernia repair (Right, 1990's). Prior to Admission medications   Medication Sig Start Date End Date Taking? Authorizing Provider  Ascorbic Acid (VITAMIN C) 1000 MG tablet Take 1,000 mg by mouth daily.   Yes [provider]  ASHWAGANDHA PO Take 1 tablet by mouth 2 (two) times daily.    Yes [provider]  aspirin EC 81 MG tablet Take 81 mg by mouth daily.    Yes [provider]  folic acid (FOLVITE) 195 MCG tablet Take 800 mcg by mouth daily.   Yes [provider]  Glucosamine-Chondroitin (COSAMIN DS PO) Take 1 tablet by mouth daily.   Yes [provider]  Multiple Vitamin (MULTIVITAMIN WITH MINERALS) TABS tablet Take 1 tablet by mouth daily. Senior Multivitamin.   Yes [provider]  Omega-3 1000 MG CAPS Take 1,000 mg by mouth daily.    Yes [provider]  omeprazole (PRILOSEC) 40 MG capsule Take 40 mg by mouth daily before supper.  11/04/16  Yes [provider]  naproxen sodium (ANAPROX) 220 MG tablet Take 220 mg by mouth every 12 (twelve) hours as needed (for pain.).    [provider]   No Known Allergies  FAMILY HISTORY:  family history includes Alzheimer's disease in his father and mother; Stroke in his father. SOCIAL HISTORY:  reports that he quit smoking about 9 years ago. He quit after 50.00 years of use. He has never used smokeless tobacco. He reports that he does not drink alcohol or use drugs.  REVIEW OF SYSTEMS: Positives in BOLD  Constitutional: Negative for fever, chills, weight loss, malaise/fatigue and diaphoresis.  HENT: Negative for hearing loss, ear pain, nosebleeds, congestion, sore throat, neck pain, tinnitus and ear discharge.   Eyes: Negative for blurred vision, double vision, photophobia, pain, discharge and redness.  Respiratory: Negative for cough, hemoptysis, sputum production, shortness of breath, wheezing and stridor.   Cardiovascular: right sided surgical flank pain, chest pain, palpitations, orthopnea, claudication, leg swelling and PND.  Gastrointestinal: Negativeheartburn, nausea, vomiting, abdominal pain, diarrhea, constipation, blood in stool and melena.  Genitourinary: Negative for dysuria, urgency, frequency, hematuria and flank pain.  Musculoskeletal: Negative for myalgias, back pain,  joint pain and falls.  Skin: Negative for itching and rash.  Neurological: Negative for dizziness, tingling, tremors, sensory change, speech change, focal weakness, seizures, loss of consciousness, weakness and headaches.  Endo/Heme/Allergies: Negative for environmental  allergies and polydipsia. Does not bruise/bleed easily.  SUBJECTIVE:  c/o right sided flank pain   VITAL SIGNS: Temp:  [97.2 F (36.2 C)-97.7 F (36.5 C)] 97.2 F (36.2 C) (10/18 1422) Pulse Rate:  [58-70] 58 (10/18 1507) Resp:  [12-18] 12 (10/18 1507) BP: (125-168)/(72-90) 125/72 (10/18 1507) SpO2:  [93 %-100 %] 93 % (10/18 1507)  PHYSICAL EXAMINATION: General: well developed, well nourished male, NAD  Neuro: alert and oriented, follows commands  HEENT: supple, no JVD  Cardiovascular: sinus brady, s2s2, no M/R/G  Lungs: diminished throughout right greater than left, even, non labored, 2 right sided straight chest tubes with sanguinous drainage  Abdomen: hypoactive BS x4, soft, non tender, non distended  Musculoskeletal: normal bulk and tone, no edema  Skin: right sided chest tube sites dressings dry and intact no subcutaneous emphysema    Recent Labs Lab 01/11/17 1544  NA 140  K 3.4*  CL 102  CO2 28  BUN 15  CREATININE 1.08  GLUCOSE 94    Recent Labs Lab 01/11/17 1544  HGB 15.4  HCT 45.8  WBC 6.7  PLT 167   Dg Chest Port 1 View  Result Date: 01/14/2017 CLINICAL DATA:  Status post right-sided thoracoscopy with wide red wedge resection preoperative examination prior to bronchoscopy. EXAM: PORTABLE CHEST 1 VIEW COMPARISON:  Chest x-ray of January 11, 2017 FINDINGS: There remains an approximately 5% or less right-sided pneumothorax. There are 2 right-sided chest tubes present. The tip of the upper tube lies in the apex overlying the posteromedial aspect of the third rib. The lower tube projects in the medial cardio phrenic angle. There is patchy density at the left lung base. The heart is top-normal in size. The pulmonary vascularity is mildly engorged. There is calcification in the wall of the aortic arch. The bony thorax exhibits no acute abnormality. IMPRESSION: 5% or less right apical pneumothorax. Two chest tubes are present. Patchy density lateral to the cardiac  apex likely reflects subsegmental atelectasis. Top-normal cardiac size with mild central pulmonary vascular prominence. Thoracic aortic atherosclerosis. Electronically Signed   By: Robie Mcniel  [place] M.D.   On: 01/14/2017 14:46    ASSESSMENT / PLAN: S/P Right Thoracoscopy with Wedge Resection Right Upper Lobe Mass P: Supplemental O2 to maintain O2 sats >92% Continue scheduled bronchodilator therapy Continue abx  Repeat CXR in am Prn morphine, tramadol, and oxycodone for pain management D51/2NS @75  ml/hr  Keep NPO for now  Cardiothoracic Surgery primary management  Turn, cough, and deep breathing encouraged  Right sided chest tube to 20 cm suction   Marda Stalker, [place] Pager [phone] (please enter 7 digits) PCCM Consult Pager [phone] (please enter 7 digits)   Merton Border, MD PCCM service Mobile [phone] Pager [phone] 01/15/2017 3:10 PM

## 2017-01-14 NOTE — Op Note (Signed)
  01/14/2017  2:24 PM  PATIENT:  Curtis Andrade.  71 y.o. male  PRE-OPERATIVE DIAGNOSIS:  Right upper lobe lung mass  POST-OPERATIVE DIAGNOSIS:  Frozen section consistent with adenocarcinoma  PROCEDURE:  Right thoracoscopy with wedge resection right upper lobe mass  SURGEON:  Surgeon(s) and Role:    * [name], MD - Primary    * Pabon, Diego F, MD - Assisting  ASSISTANTS: Venita Sheffield PAS  ANESTHESIA: Gen. endotracheal anesthesia with double-lumen tube  INDICATIONS FOR PROCEDURE this is a 71 year old gentleman with a previous head and neck cancer who was found to have a 1 cm right upper lobe mass. The lesion was located medially against the mediastinum and he had a PET scan done and repeat chest CT which showed no change in the nodule but because of his previous history of cancer I encouraged him to have this resected. He was apprised of the indications and risks of surgery including the indications and risks of wedge resection versus lobectomy. He gave his informed consent.  DICTATION: Patient is brought to the operating suite and placed in supine position. General endotracheal anesthesia was given through a double-lumen tube. Preoperative bronchoscopy was carried out. The right mainstem and its branches were all free of any tumor. The patient was then into the left lateral decubitus position for a right thoracoscopy. The endotracheal tube was secured. The patient was then prepped and draped in usual sterile fashion. We began by making a single anterior port to accommodate a 20 mm trocar. The chest was entered through approximately the fourth interspace. Once we opened the chest were then put the scope into were able to place 2 additional ports in a triangular fashion on the lower aspect of the hemithorax. Through these 3 ports we could perform the procedure. The right upper lobe was rotated posteriorly and the lesion was identified in the medial aspect. It was grasped with a large  ringed forceps. Using an endoscopic stapler a wide wedge resection was performed and this was sent for frozen section. 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 The margins were approximately 2 cm. The tumor measured about 1 cm. The suture line was hemostatic. We then used Experel on the port sites. Hemostasis was complete. 2 chest tubes were inserted. An angled chest tube was positioned along the paravertebral space. A straight chest tube was positioned to the apex. These were brought out through separate stab wounds. The wounds were then closed in multiple layers using running absorbable sutures. The skin was closed with nylon. Sterile dressings were applied. The patient was enrolled in the supine position where he was extubated and taken to the recovery room in stable condition.   [name], MD

## 2017-01-14 NOTE — Interval H&P Note (Signed)
History and Physical Interval Note:  01/14/2017 10:55 AM  Curtis Andrade.  has presented today for surgery, with the diagnosis of LUNG MASS  The various methods of treatment have been discussed with the patient and family. After consideration of risks, benefits and other options for treatment, the patient has consented to  Procedure(s): THORACOSCOPY, POSSIBLE THORACOTOMY WITH WIDE WEDGE RESECTION, POSSIBLE LOBECTOMY, PREOP BROCHOSCOPY (Right) as a surgical intervention .  The patient's history has been reviewed, patient examined, no change in status, stable for surgery.  I have reviewed the patient's chart and labs.  Questions were answered to the patient's satisfaction.     [name]

## 2017-01-14 NOTE — Anesthesia Postprocedure Evaluation (Signed)
Anesthesia Post Note  Patient: Curtis Andrade.  Procedure(s) Performed: RIGHT THORACOSCOPYWITH WIDE WEDGE RESECTION,  PREOP BROCHOSCOPY (Right )  Patient location during evaluation: PACU Anesthesia Type: General Level of consciousness: awake and alert and oriented Pain management: pain level controlled Vital Signs Assessment: post-procedure vital signs reviewed and stable Respiratory status: spontaneous breathing, nonlabored ventilation and respiratory function stable Cardiovascular status: blood pressure returned to baseline and stable Postop Assessment: no signs of nausea or vomiting Anesthetic complications: no     Last Vitals:  Vitals:   01/14/17 1422 01/14/17 1437  BP: (!) 156/87 (!) 151/90  Pulse: 70 63  Resp: 16 15  Temp: (!) 36.2 C   SpO2: 98% 99%    Last Pain:  Vitals:   01/14/17 1443  PainSc: 5                  Liberta Gimpel

## 2017-01-14 NOTE — Anesthesia Post-op Follow-up Note (Signed)
Anesthesia QCDR form completed.        

## 2017-01-15 ENCOUNTER — Encounter: Payer: Self-pay | Admitting: Cardiothoracic Surgery

## 2017-01-15 ENCOUNTER — Inpatient Hospital Stay: Payer: PPO

## 2017-01-15 DIAGNOSIS — R918 Other nonspecific abnormal finding of lung field: Secondary | ICD-10-CM

## 2017-01-15 LAB — SURGICAL PATHOLOGY

## 2017-01-15 IMAGING — CR DG CHEST 2V
2 series · 2 of 2 positions shown · non-contrast
Comparison: [DATE]

CLINICAL DATA: Postoperative.  Chest tubes.

EXAM:
CHEST  2 VIEW

[chest pa]
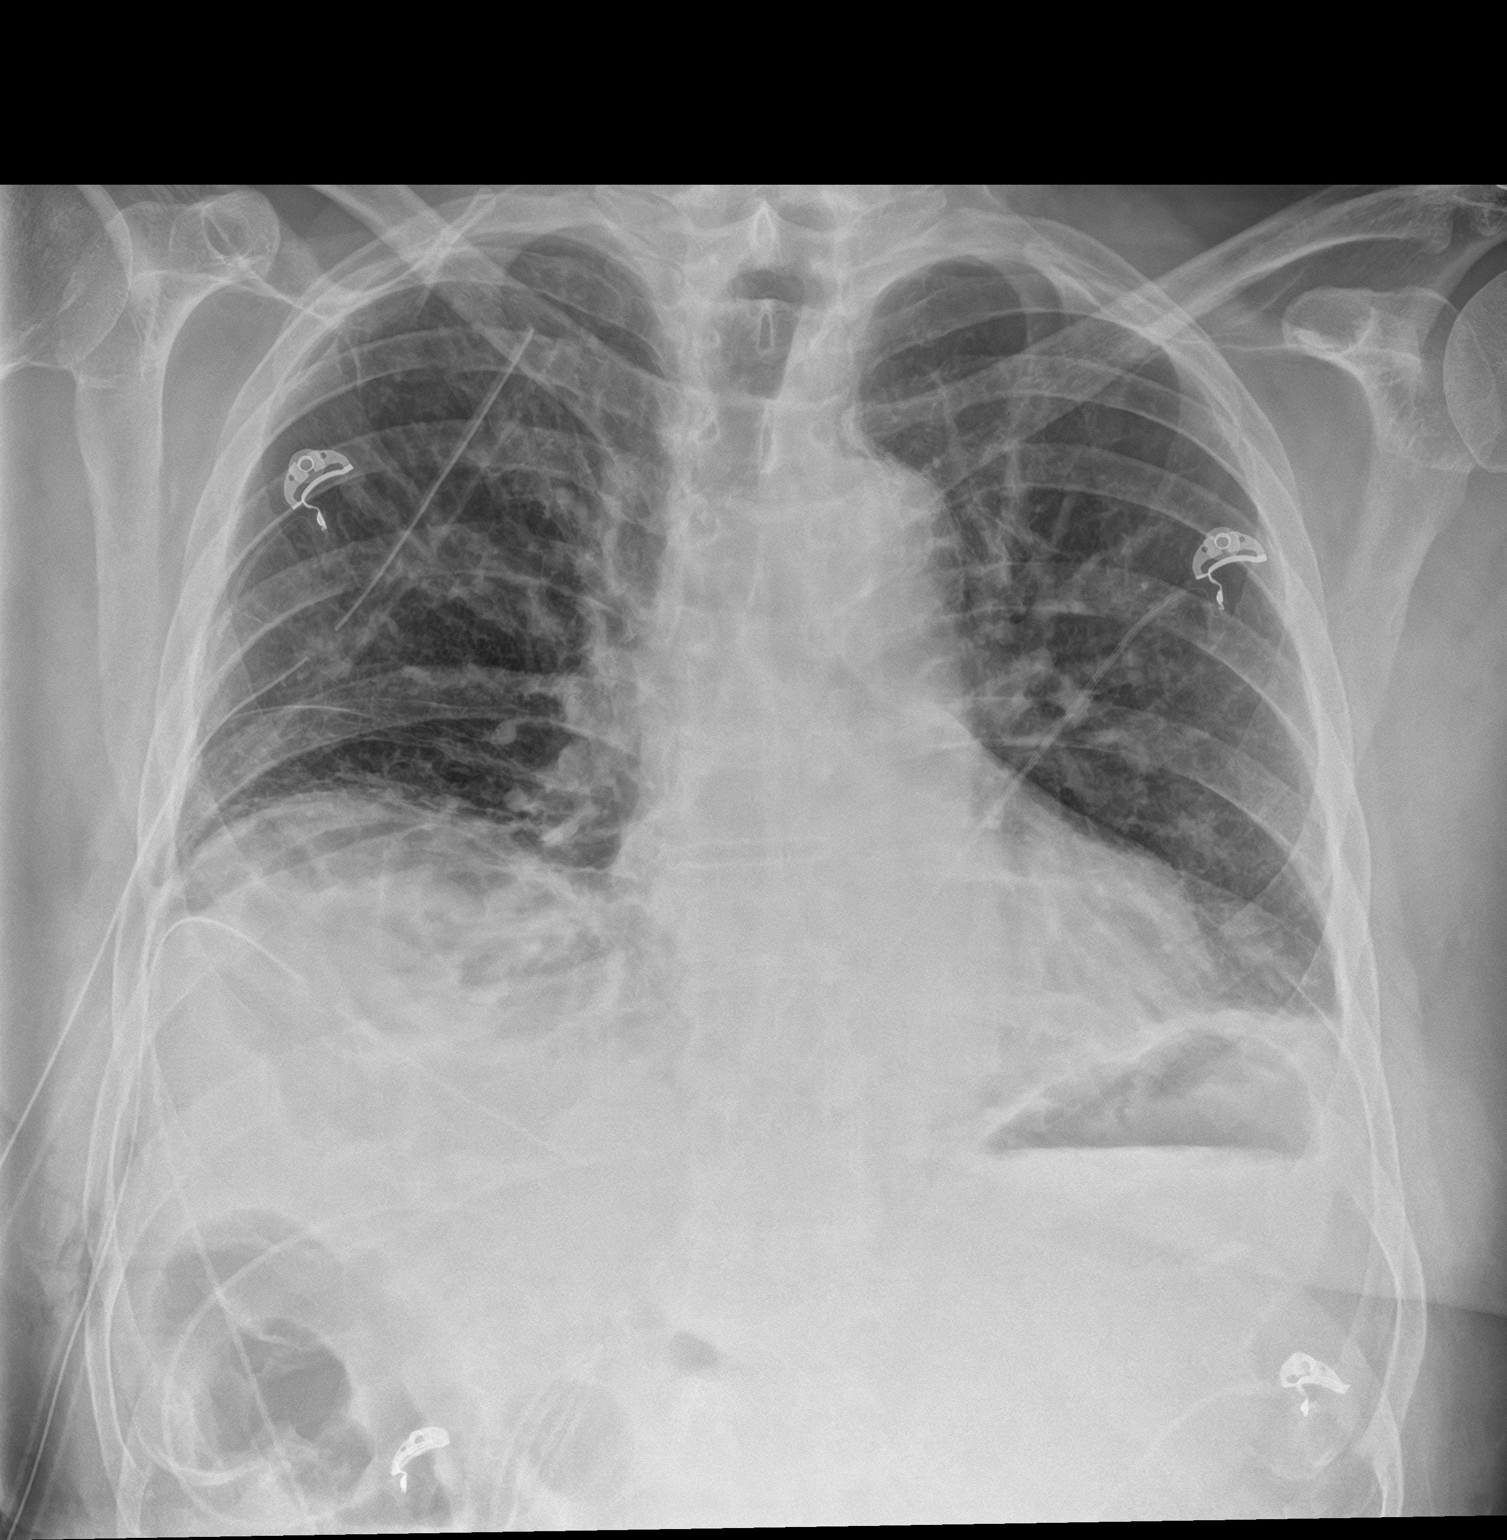

[chest lat]
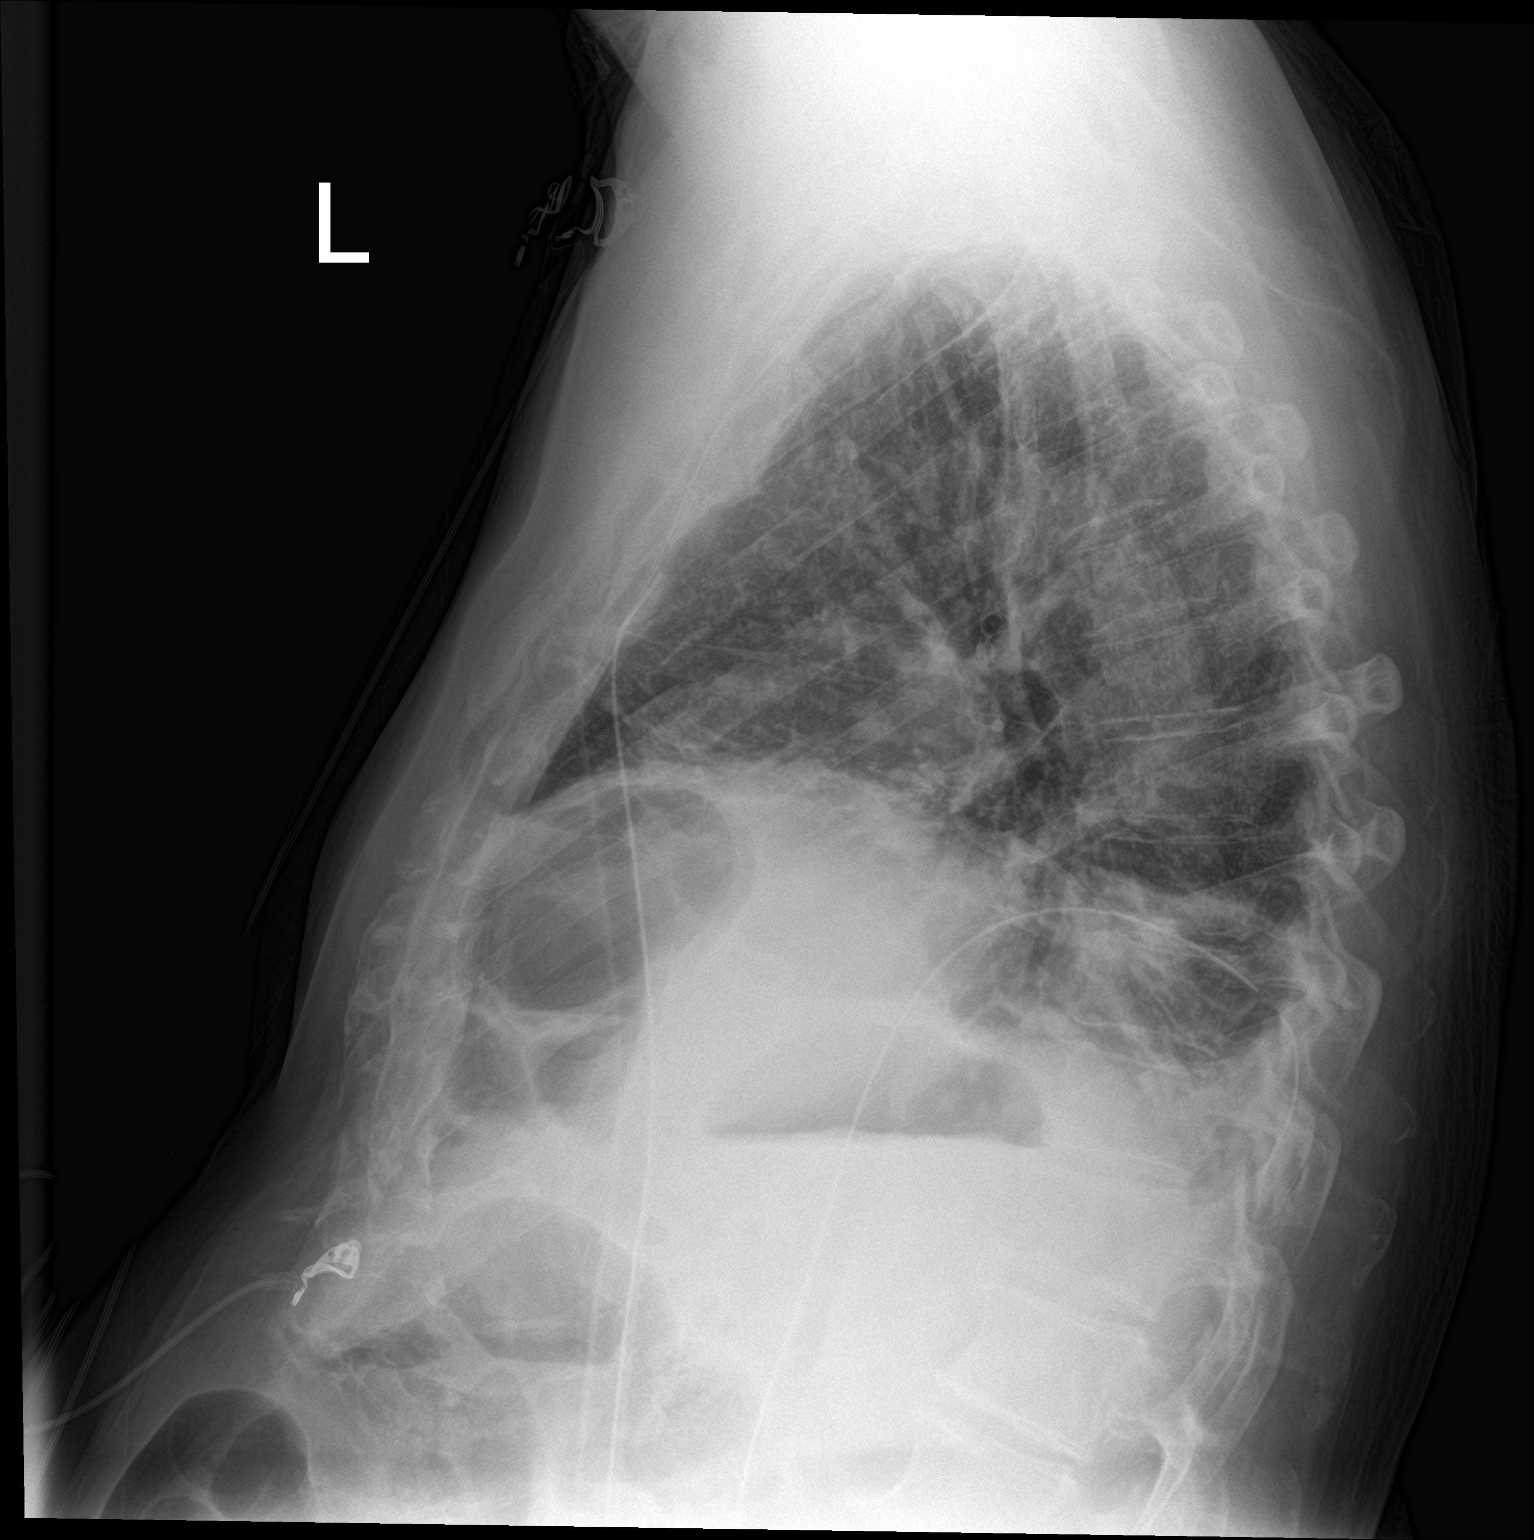

[2 of 2 positions shown; findings below may reference images not displayed]

FINDINGS: Shallow inspiration with atelectasis in the lung bases. Colonic
interposition under the right hemidiaphragm. Two right chest tubes
are present without change in position since the previous study.
Minimal residual apical pneumothorax. Heart size is normal. Mild
pulmonary vascular congestion. No consolidation. Calcification of
the aorta.
IMPRESSION: Two right chest tubes are unchanged in position. Minimal residual
apical pneumothorax. Suggestion of mild pulmonary vascular
congestion. No focal consolidation. Shallow inspiration with
atelectasis in the lung bases.

## 2017-01-15 MED ORDER — HYDROCODONE-ACETAMINOPHEN 7.5-325 MG PO TABS
1.0000 | ORAL_TABLET | ORAL | Status: DC | PRN
  Administered 2017-01-15 – 2017-01-16 (×2): 1 via ORAL
  Filled 2017-01-15: qty 1
  Filled 2017-01-15: qty 2
  Filled 2017-01-15: qty 1

## 2017-01-15 MED ORDER — TRAMADOL HCL 50 MG PO TABS
100.0000 mg | ORAL_TABLET | Freq: Four times a day (QID) | ORAL | Status: DC
  Administered 2017-01-15 – 2017-01-17 (×8): 100 mg via ORAL
  Filled 2017-01-15 (×9): qty 2

## 2017-01-15 NOTE — Progress Notes (Signed)
   Name: Curtis Andrade. MRN: [id number] DOB: 06/27/1945    BRIEF PATIENT DESCRIPTION:  71 yo male admitted 10/18 s/p right thoracoscopy with wedge resection right upper lobe lung mass   SIGNIFICANT EVENTS  10/18-Pt admitted to ICU   STUDIES:  None   SUBJECTIVE:  Right sided flank pain improving   VITAL SIGNS: Temp:  [97.2 F (36.2 C)-98.5 F (36.9 C)] 97.7 F (36.5 C) (10/19 0800) Pulse Rate:  [54-94] 81 (10/19 0900) Resp:  [11-18] 13 (10/19 0900) BP: (111-161)/(62-90) 111/67 (10/19 0900) SpO2:  [90 %-100 %] 97 % (10/19 0900) Weight:  [95.6 kg (210 lb 12.2 oz)] 95.6 kg (210 lb 12.2 oz) (10/18 1556)  PHYSICAL EXAMINATION: General: well developed, well nourished male, NAD  Neuro: alert and oriented, follows commands  HEENT: supple, no JVD  Cardiovascular: nsr, s1s2, no M/R/G  Lungs: diminished throughout right greater than left, even, non labored, 2 right sided straight chest tubes with serosanguinous drainage  Abdomen: hypoactive BS x4, soft, non tender, non distended  Musculoskeletal: normal bulk and tone, no edema  Skin: right sided chest tube sites dressings dry and intact no subcutaneous emphysema    Recent Labs Lab 01/11/17 1544 01/14/17 1629  NA 140 138  K 3.4* 3.9  CL 102 104  CO2 28 27  BUN 15 16  CREATININE 1.08 1.15  GLUCOSE 94 126*    Recent Labs Lab 01/11/17 1544 01/14/17 1629  HGB 15.4 15.5  HCT 45.8 46.3  WBC 6.7 13.1*  PLT 167 150   Dg Chest Port 1 View  Result Date: 01/14/2017 CLINICAL DATA:  Status post right-sided thoracoscopy with wide red wedge resection preoperative examination prior to bronchoscopy. EXAM: PORTABLE CHEST 1 VIEW COMPARISON:  Chest x-ray of January 11, 2017 FINDINGS: There remains an approximately 5% or less right-sided pneumothorax. There are 2 right-sided chest tubes present. The tip of the upper tube lies in the apex overlying the posteromedial aspect of the third rib. The lower tube projects in the medial cardio  phrenic angle. There is patchy density at the left lung base. The heart is top-normal in size. The pulmonary vascularity is mildly engorged. There is calcification in the wall of the aortic arch. The bony thorax exhibits no acute abnormality. IMPRESSION: 5% or less right apical pneumothorax. Two chest tubes are present. Patchy density lateral to the cardiac apex likely reflects subsegmental atelectasis. Top-normal cardiac size with mild central pulmonary vascular prominence. Thoracic aortic atherosclerosis. Electronically Signed   By: Maryela Tapper  [place] M.D.   On: 01/14/2017 14:46    ASSESSMENT / PLAN: S/P Right Thoracoscopy with Wedge Resection Right Upper Lobe Mass P: Supplemental O2 to maintain O2 sats >92% Continue scheduled bronchodilator therapy Continue abx  Prn morphine, tramadol, and oxycodone for pain management Diet ordered  Cardiothoracic Surgery primary management  Turn, cough, and deep breathing encouraged  Right sided chest tube to 20 cm suction   -Pt transferring out of ICU today 10/19 once bed becomes available PCCM will sign off thank you for the consultation if you need further assistance please call on call pager # listed in [place], [place] Pager [phone] (please enter 7 digits) [place] Pager [phone] (please enter 7 digits)  Merton Border, MD PCCM service Mobile [phone] Pager [phone] 01/15/2017 3:10 PM

## 2017-01-15 NOTE — Progress Notes (Signed)
Pt A & O x 4 with no c/o pain at this time. NSR on cardiac monitor. Pt with dual CT to R side. 3LNC, SpO2 >90%. Slight SOB when standing at bedside-maintained 3LNC, no increased WOB-pt encouraged to focus on breathing in thru nose and out thru mouth. Pt meeting IS at 2500. Wife is at bedside. Orders to transfer to med-surg with tele.

## 2017-01-15 NOTE — Evaluation (Signed)
Physical Therapy Evaluation Patient Details Name: Curtis Andrade. MRN: [id number] DOB: [birth year] Today's Date: 01/15/2017   History of Present Illness  Pt is a 71 yo male with a PMH of Head and Neck Cancer who was found to have a 1 cm right upper lobe nodule adjacent to the mediastinum. Following repeat CT Chest and PET scan revealingno change in lung nodule and due to hx of cancer thoracic surgeon Dr. Genevive Bi recommended a resection. Therefore, [facility] with wedge resection of right upper lobe mass. He was subsequently admitted to ICU post procedure for further workup and treatment PCCM consulted.  PMH significant for laryngeal CA, phlebitis, and Psoriasis.      Clinical Impression  Pt presents with min-mod deficits in strength, transfers, mobility, gait, balance, and activity tolerance but overall performed well with PT.  Pt required min A for upright sitting from supine and was able to stand from EOB with SBA with good stability.  Nurse present to assist with ambulation secondary to quantity of lines, chest tube, and O2.  Pt able to amb 2 x 50' with RW with close SBA.  Slow cadence with gait but steady without LOB.  SpO2 89-93% during amb with HR WNL on 4LO2/min.  Pt reported fatigue after amb but no adverse symptoms.  Pt will benefit from HHPT services upon discharge to safely address above deficits for decreased caregiver assistance and eventual return to PLOF.        Follow Up Recommendations Home health PT    Equipment Recommendations  None recommended by PT    Recommendations for Other Services       Precautions / Restrictions Precautions Precautions: Fall Precaution Comments: R side chest tube in place Restrictions Weight Bearing Restrictions: No Other Position/Activity Restrictions: Per MD may place chest tube to water seal during ambulation      Mobility  Bed Mobility Overal bed mobility: Needs Assistance Bed  Mobility: Supine to Sit     Supine to sit: Min assist     General bed mobility comments: Min A to come to full upright sitting  Transfers Overall transfer level: Needs assistance Equipment used: Rolling walker (2 wheeled) Transfers: Sit to/from Stand Sit to Stand: Supervision         General transfer comment: Good effort during sit to stand from EOB with good initial stability  Ambulation/Gait Ambulation/Gait assistance: Supervision Ambulation Distance (Feet): 50 Feet Assistive device: Rolling walker (2 wheeled) Gait Pattern/deviations: Step-through pattern;Decreased step length - right;Decreased step length - left   Gait velocity interpretation: Below normal speed for age/gender General Gait Details: Slow cadence with gait but steady without LOB, SpO2 89-93% during amb with HR WNL on 4LO2/min.  Pt reported fatigue after amb but no adverse symptoms.        Stairs Stairs:  (Deferred)          Wheelchair Mobility    Modified Rankin (Stroke Patients Only)       Balance Overall balance assessment: Needs assistance Sitting-balance support: Feet unsupported;Feet supported;No upper extremity supported Sitting balance-Leahy Scale: Normal     Standing balance support: Bilateral upper extremity supported Standing balance-Leahy Scale: Good                               Pertinent Vitals/Pain Pain Assessment: 0-10 Pain Score: 4  Pain Location: R sided surgical site pain Pain Descriptors / Indicators: Operative  site guarding;Aching Pain Intervention(s): Premedicated before session;Monitored during session;Limited activity within patient's tolerance    Home Living Family/patient expects to be discharged to:: Private residence Living Arrangements: Spouse/significant other Available Help at Discharge: Family;Available 24 hours/day Type of Home: House Home Access: Stairs to enter Entrance Stairs-Rails: None Entrance Stairs-Number of Steps: 1 Home Layout:  One level Home Equipment: Walker - 2 wheels;Cane - single point      Prior Function Level of Independence: Independent         Comments: Pt Ind with amb community distances without AD, no fall history, Ind with ADLs, works out at [employer]        Extremity/Trunk Assessment   Upper Extremity Assessment Upper Extremity Assessment: Overall WFL for tasks assessed    Lower Extremity Assessment Lower Extremity Assessment: Generalized weakness       Communication   Communication: No difficulties  Cognition Arousal/Alertness: Awake/alert Behavior During Therapy: WFL for tasks assessed/performed Overall Cognitive Status: Within Functional Limits for tasks assessed                                        General Comments      Exercises     Assessment/Plan    PT Assessment Patient needs continued PT services  PT Problem List Decreased strength;Decreased activity tolerance;Decreased balance;Decreased mobility       PT Treatment Interventions DME instruction;Gait training;Stair training;Functional mobility training;Neuromuscular re-education;Balance training;Therapeutic exercise;Therapeutic activities;Patient/family education    PT Goals (Current goals can be found in the Care Plan section)  Acute Rehab PT Goals Patient Stated Goal: To get back to doing what I used to do PT Goal Formulation: With patient Time For Goal Achievement: 01/27/17 Potential to Achieve Goals: Good    Frequency Min 2X/week   Barriers to discharge        Co-evaluation               AM-PAC PT "6 Clicks" Daily Activity  Outcome Measure Difficulty turning over in bed (including adjusting bedclothes, sheets and blankets)?: Unable Difficulty moving from lying on back to sitting on the side of the bed? : Unable Difficulty sitting down on and standing up from a chair with arms (e.g., wheelchair, bedside commode, etc,.)?: A Little Help needed moving to  and from a bed to chair (including a wheelchair)?: A Little Help needed walking in hospital room?: A Little Help needed climbing 3-5 steps with a railing? : A Little 6 Click Score: 14    End of Session Equipment Utilized During Treatment: Oxygen (Gait belt not used secondary to surgical sight and chest tube making placement unsafe) Activity Tolerance: Patient tolerated treatment well Patient left: in chair;with family/visitor present;with call bell/phone within reach Nurse Communication: Mobility status;Other (comment) (Nursing notified when session complete secondary to chest tube on water seal) PT Visit Diagnosis: Difficulty in walking, not elsewhere classified (R26.2);Muscle weakness (generalized) (M62.81)    Time: 1324-4010 PT Time Calculation (min) (ACUTE ONLY): 42 min   Charges:   PT Evaluation $PT Eval Moderate Complexity: 1 Mod PT Treatments $Gait Training: 8-22 mins   PT G Codes:   PT G-Codes **NOT FOR INPATIENT CLASS** Functional Assessment Tool Used: AM-PAC 6 Clicks Basic Mobility Functional Limitation: Mobility: Walking and moving around Mobility: Walking and Moving Around Current Status (U7253): At least 40 percent but less than 60 percent impaired, limited or restricted Mobility:  Walking and Moving Around Goal Status [phone]): At least 1 percent but less than 20 percent impaired, limited or restricted    D. Royetta Asal PT, DPT 01/15/17, 4:58 PM

## 2017-01-15 NOTE — Progress Notes (Signed)
Some discomfort but not bad.  Short of breath with ambulation this morning.  Eating regular diet.  Lungs clear and equal bilaterally Heart regular  No air leak  Serous chest tube drainage.  210 cc since surgery  Will transfer to floor. Physical Therapy to see Discontinue Foley and IV fluids.  [employer].

## 2017-01-16 LAB — BASIC METABOLIC PANEL
Anion gap: 9 (ref 5–15)
BUN: 15 mg/dL (ref 6–20)
CALCIUM: 8.4 mg/dL — AB (ref 8.9–10.3)
CO2: 26 mmol/L (ref 22–32)
Chloride: 92 mmol/L — ABNORMAL LOW (ref 101–111)
Creatinine, Ser: 0.93 mg/dL (ref 0.61–1.24)
GFR calc Af Amer: >60 mL/min (ref >60)
GLUCOSE: 139 mg/dL — AB (ref 65–99)
POTASSIUM: 4 mmol/L (ref 3.5–5.1)
Sodium: 127 mmol/L — ABNORMAL LOW (ref 135–145)

## 2017-01-16 LAB — TYPE AND SCREEN
ABO/RH(D): O POS
ANTIBODY SCREEN: NEGATIVE
UNIT DIVISION: 0
UNIT DIVISION: 0

## 2017-01-16 LAB — MAGNESIUM: Magnesium: 2 mg/dL (ref 1.7–2.4)

## 2017-01-16 LAB — BPAM RBC
BLOOD PRODUCT EXPIRATION DATE: 201810272359
Blood Product Expiration Date: 201810272359
UNIT TYPE AND RH: 5100
Unit Type and Rh: 5100

## 2017-01-16 MED ORDER — FUROSEMIDE 10 MG/ML IJ SOLN
40.0000 mg | Freq: Once | INTRAMUSCULAR | Status: AC
  Administered 2017-01-16: 40 mg via INTRAVENOUS
  Filled 2017-01-16: qty 4

## 2017-01-16 MED ORDER — NITROGLYCERIN 2 % TD OINT
0.5000 [in_us] | TOPICAL_OINTMENT | Freq: Four times a day (QID) | TRANSDERMAL | Status: DC
  Administered 2017-01-16 – 2017-01-17 (×6): 0.5 [in_us] via TOPICAL
  Filled 2017-01-16 (×6): qty 1

## 2017-01-16 MED ORDER — KETOROLAC TROMETHAMINE 15 MG/ML IJ SOLN
15.0000 mg | Freq: Four times a day (QID) | INTRAMUSCULAR | Status: DC
  Administered 2017-01-16 – 2017-01-18 (×9): 15 mg via INTRAVENOUS
  Filled 2017-01-16 (×9): qty 1

## 2017-01-16 MED ORDER — ALBUTEROL SULFATE (2.5 MG/3ML) 0.083% IN NEBU
2.5000 mg | INHALATION_SOLUTION | Freq: Three times a day (TID) | RESPIRATORY_TRACT | Status: DC
  Administered 2017-01-16 – 2017-01-17 (×3): 2.5 mg via RESPIRATORY_TRACT
  Filled 2017-01-16 (×4): qty 3

## 2017-01-16 NOTE — Progress Notes (Signed)
Assumed care of pt from ICU @ 2130. A+Ox4. Pt oriented to room and unit. R sided chest tube in place, connected to -20cm suction. Pt medicated with scheduled tramadol for pain control, effective. Bed alarm on for fall prevention.

## 2017-01-16 NOTE — Progress Notes (Signed)
POD # 2 Some SOB CXR reviewed he is wet AVSS EKG reviewed NSR  PE NAD There is venous jugular distension Chest: CT in place. Posterior one DCed. Serous fluid. Incisions c/d/i. Some ronchi  A/P Lasix Mobilize pulm toilet Keep CT CXR in am

## 2017-01-17 ENCOUNTER — Inpatient Hospital Stay: Payer: PPO

## 2017-01-17 LAB — CBC
HCT: 45.6 % (ref 40.0–52.0)
HEMOGLOBIN: 15.6 g/dL (ref 13.0–18.0)
MCH: 32.6 pg (ref 26.0–34.0)
MCHC: 34.1 g/dL (ref 32.0–36.0)
MCV: 95.4 fL (ref 80.0–100.0)
Platelets: 178 10*3/uL (ref 150–440)
RBC: 4.77 MIL/uL (ref 4.40–5.90)
RDW: 14.2 % (ref 11.5–14.5)
WBC: 10.3 10*3/uL (ref 3.8–10.6)

## 2017-01-17 LAB — BASIC METABOLIC PANEL
ANION GAP: 10 (ref 5–15)
BUN: 23 mg/dL — ABNORMAL HIGH (ref 6–20)
CHLORIDE: 89 mmol/L — AB (ref 101–111)
CO2: 28 mmol/L (ref 22–32)
Calcium: 8.7 mg/dL — ABNORMAL LOW (ref 8.9–10.3)
Creatinine, Ser: 1.01 mg/dL (ref 0.61–1.24)
GFR calc Af Amer: >60 mL/min (ref >60)
Glucose, Bld: 114 mg/dL — ABNORMAL HIGH (ref 65–99)
POTASSIUM: 3.9 mmol/L (ref 3.5–5.1)
SODIUM: 127 mmol/L — AB (ref 135–145)

## 2017-01-17 LAB — MAGNESIUM: MAGNESIUM: 2.1 mg/dL (ref 1.7–2.4)

## 2017-01-17 IMAGING — DX DG CHEST 1V PORT
1 series · 1 of 1 positions shown · non-contrast
Comparison: [DATE] and prior exams

CLINICAL DATA: Right upper lobe wedge resection for lung cancer.

EXAM:
PORTABLE CHEST 1 VIEW

[chest ap]
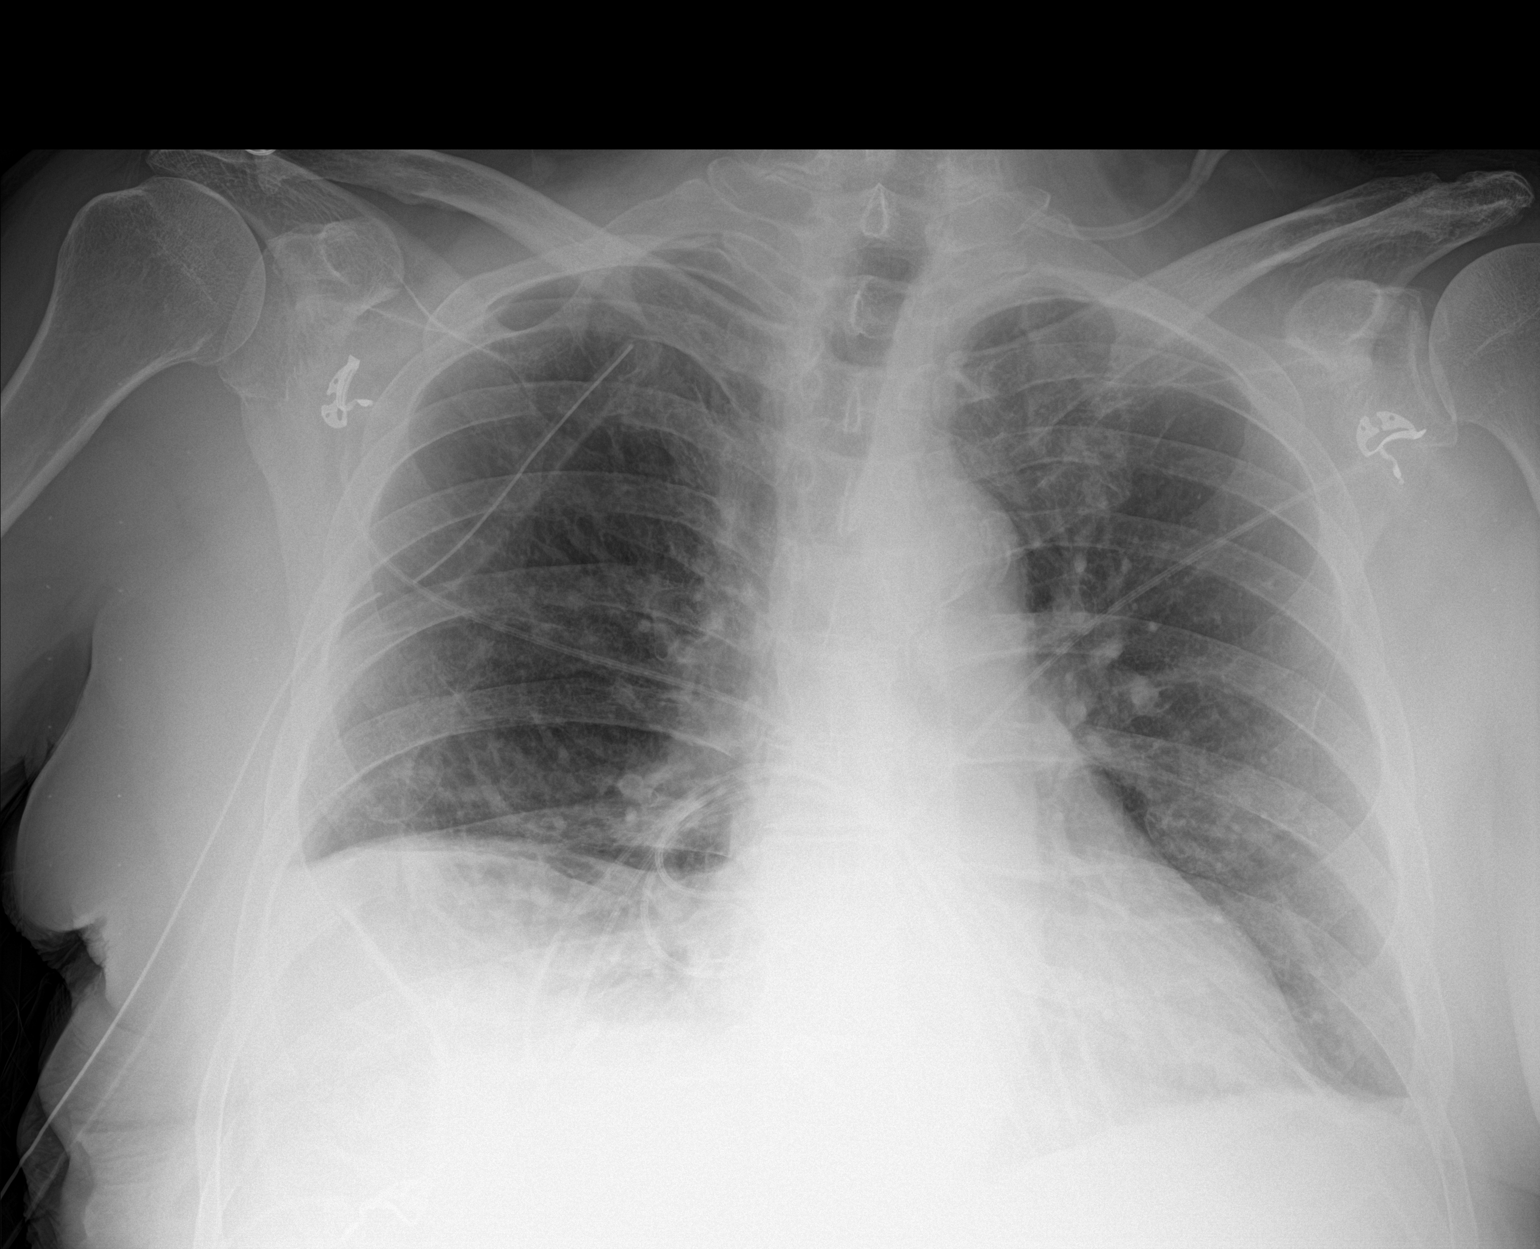

[1 of 1 positions shown; findings below may reference images not displayed]

FINDINGS: The lower right thoracostomy tube has been removed and the upper
right thoracostomy tube is unchanged. There is no evidence of
pneumothorax.

Right hemithorax volume loss/surgical change again noted with un
chain mild right basilar atelectasis.

The left lung is clear.

There is no evidence of pulmonary edema
IMPRESSION: Removal of 1 of the right thoracostomy tubes without other
significant change. No evidence of pneumothorax.

## 2017-01-17 MED ORDER — ALUM & MAG HYDROXIDE-SIMETH 200-200-20 MG/5ML PO SUSP
30.0000 mL | Freq: Four times a day (QID) | ORAL | Status: DC | PRN
  Administered 2017-01-17 – 2017-01-18 (×3): 30 mL via ORAL
  Filled 2017-01-17 (×3): qty 30

## 2017-01-17 MED ORDER — LACTULOSE 10 GM/15ML PO SOLN
30.0000 g | Freq: Two times a day (BID) | ORAL | Status: DC | PRN
Start: 2017-01-17 — End: 2017-01-18
  Administered 2017-01-17: 30 g via ORAL
  Filled 2017-01-17: qty 60

## 2017-01-17 MED ORDER — ALBUTEROL SULFATE (2.5 MG/3ML) 0.083% IN NEBU: 2.5000 mg | INHALATION_SOLUTION | RESPIRATORY_TRACT | Status: DC | PRN

## 2017-01-17 MED ORDER — ALBUTEROL SULFATE (2.5 MG/3ML) 0.083% IN NEBU
2.5000 mg | INHALATION_SOLUTION | Freq: Two times a day (BID) | RESPIRATORY_TRACT | Status: DC
  Administered 2017-01-17 – 2017-01-18 (×2): 2.5 mg via RESPIRATORY_TRACT
  Filled 2017-01-17 (×2): qty 3

## 2017-01-17 MED ORDER — DOCUSATE SODIUM 100 MG PO CAPS
200.0000 mg | ORAL_CAPSULE | Freq: Two times a day (BID) | ORAL | Status: DC
  Administered 2017-01-17 (×2): 200 mg via ORAL
  Filled 2017-01-17 (×3): qty 2

## 2017-01-17 NOTE — Progress Notes (Signed)
No BM since 10/18.  Dr Dahlia Byes notified.  Orders received for colace and lactulose

## 2017-01-17 NOTE — Consult Note (Signed)
[facility] Consultation Note  Patient ID: Curtis Erker., MRN: [id number], DOB/AGE: Jun 17, 1945 71 y.o. Admit date: 01/14/2017   Date of Consult: 01/17/2017 Primary Physician: [name], MD Primary Cardiologist: None  Chief Complaint: No chief complaint on file.  Reason for Consult: atrial fibrillation  HPI: 71 y.o. male with known previous laryngeal cancer and now a lung cancer status post resection for which the patient had no evidence of significant complications of surgery and is recovering fairly well. There is been no evidence of chest pain or congestive heart failure at this time. The patient has had some higher blood pressure status post surgery of which has been helped with nitroglycerin although the patient has had slight headache and nausea with this issue. The patient has had slow improvements of blood pressure control with recovery. Additionally the patient has had new onset of atrial fibrillation nonvalvular in nature for which he has had rare palpitations but no other significant symptoms. His heart rate is in the 80 beat per minute range with an EKG with nonspecific ST changes. He has had a low CHA DS score and therefore would be at low risk for stroke with atrial fibrillation and would hold off on anticoagulation until fully recovered from his lung surgery and or until of low bleeding risk  Past Medical History:  Diagnosis Date  . Cancer ([place]) 2010   throat /squamous cell with radiation  . 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 Colon polyp 2013  . Hemorrhoids    pt denies  . 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 Baruch Gouty and Dr. Nadeen Landau  . Phlebitis 1998  . 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/27/2016      Surgical History:  Past Surgical History:  Procedure Laterality Date  . COLONOSCOPY  2013   Dr Vira Agar  . COLONOSCOPY WITH PROPOFOL N/A  12/23/2016   Procedure: COLONOSCOPY WITH PROPOFOL;  Surgeon: Robert Bellow, MD;  Location: [facility] ENDOSCOPY;  Service: Endoscopy;  Laterality: N/A;  . HERNIA REPAIR Right 1990's  . THORACOTOMY Right 01/14/2017   Procedure: RIGHT THORACOSCOPYWITH WIDE WEDGE RESECTION,  PREOP BROCHOSCOPY;  Surgeon: [name], MD;  Location: [facility] ORS;  Service: General;  Laterality: Right;  . THROAT SURGERY  2010     Home Meds: Prior to Admission medications   Medication Sig Start Date End Date Taking? Authorizing Provider  Ascorbic Acid (VITAMIN C) 1000 MG tablet Take 1,000 mg by mouth daily.   Yes [provider]  ASHWAGANDHA PO Take 1 tablet by mouth 2 (two) times daily.    Yes [provider]  aspirin EC 81 MG tablet Take 81 mg by mouth daily.    Yes [provider]  folic acid (FOLVITE) 578 MCG tablet Take 800 mcg by mouth daily.   Yes [provider]  Glucosamine-Chondroitin (COSAMIN DS PO) Take 1 tablet by mouth daily.   Yes [provider]  Multiple Vitamin (MULTIVITAMIN WITH MINERALS) TABS tablet Take 1 tablet by mouth daily. Senior Multivitamin.   Yes [provider]  Omega-3 1000 MG CAPS Take 1,000 mg by mouth daily.    Yes [provider]  omeprazole (PRILOSEC) 40 MG capsule Take 40 mg by mouth daily before supper.  11/04/16  Yes [provider]  naproxen sodium (ANAPROX) 220 MG tablet Take 220 mg by mouth every 12 (twelve) hours as needed (for pain.).    [provider]    Inpatient Medications:  . albuterol  2.5 mg Nebulization BID  . bisacodyl  10 mg Oral Daily  . docusate sodium  200 mg Oral BID  . ketorolac  15 mg Intravenous Q6H  . pantoprazole  80 mg Oral Daily  . traMADol  100 mg Oral Q6H     Allergies: No Known Allergies  Social History   Social History  . Marital status: Married    Spouse name: N/A  . Number of children: N/A  . Years of education: N/A   Occupational History  . Not on  file.   Social History Main Topics  . Smoking status: Former Smoker    Years: 50.00    Quit date: 03/31/2007  . Smokeless tobacco: Never Used  . Alcohol use No  . Drug use: No  . Sexual activity: Not on file   Other Topics Concern  . Not on file   Social History Narrative  . No narrative on file     Family History  Problem Relation Age of Onset  . Alzheimer's disease Mother   . Alzheimer's disease Father   . Stroke Father   . Colon cancer Neg Hx      Review of Systems Positive for palpitations Negative for: General:  chills, fever, night sweats or weight changes.  Cardiovascular: PND orthopnea syncope dizziness  Dermatological skin lesions rashes Respiratory: Cough congestion Urologic: Frequent urination urination at night and hematuria Abdominal: negative for nausea, vomiting, diarrhea, bright red blood per rectum, melena, or hematemesis Neurologic: negative for visual changes, and/or hearing changes  All other systems reviewed and are otherwise negative except as noted above.  Labs: No results for input(s): CKTOTAL, CKMB, TROPONINI in the last 72 hours. Lab Results  Component Value Date   WBC 10.3 01/17/2017   HGB 15.6 01/17/2017   HCT 45.6 01/17/2017   MCV 95.4 01/17/2017   PLT 178 01/17/2017    Recent Labs Lab 01/11/17 1544  01/17/17 0444  NA 140  < > 127*  K 3.4*  < > 3.9  CL 102  < > 89*  CO2 28  < > 28  BUN 15  < > 23*  CREATININE 1.08  < > 1.01  CALCIUM 9.0  < > 8.7*  PROT 7.4  --   --   BILITOT 0.9  --   --   ALKPHOS 49  --   --   ALT 25  --   --   AST 33  --   --   GLUCOSE 94  < > 114*  < > = values in this interval not displayed. No results found for: CHOL, HDL, LDLCALC, TRIG No results found for: DDIMER  Radiology/Studies:  Dg Chest 2 View  Result Date: 01/16/2017 CLINICAL DATA:  Postoperative.  Chest tubes. EXAM: CHEST  2 VIEW COMPARISON:  01/14/2017 FINDINGS: Shallow inspiration with atelectasis in the lung bases. Colonic  interposition under the right hemidiaphragm. Two right chest tubes are present without change in position since the previous study. Minimal residual apical pneumothorax. Heart size is normal. Mild pulmonary vascular congestion. No consolidation. Calcification of the aorta. IMPRESSION: Two right chest tubes are unchanged in position. Minimal residual apical pneumothorax. Suggestion of mild pulmonary vascular congestion. No focal consolidation. Shallow inspiration with atelectasis in the lung bases. Electronically Signed   By: Lucienne Capers M.D.   On: 01/16/2017 00:28   Dg Chest 2 View  Result Date: 01/12/2017 CLINICAL DATA:  71 year old male preoperative study  for right lung surgery. EXAM: CHEST  2 VIEW COMPARISON:  Chest CT 01/04/2017.  PET-CT 11/24/2016, and earlier. FINDINGS: Stable lung volumes. Mediastinal contours remain normal. The medial anterior 10 mm lung nodule is poorly visible radiographically. Stable lung parenchyma with mild coarse increased interstitial markings bilaterally. No pulmonary edema or pleural effusion. No new pulmonary opacity. No acute osseous abnormality identified. Negative visible bowel gas pattern. IMPRESSION: 1. Poor radiographic visualization of the medial and anterior right upper lobe lung nodule. 2.  No acute cardiopulmonary abnormality. Electronically Signed   By: Genevie Ann M.D.   On: 01/12/2017 07:51   Ct Chest W Contrast  Result Date: 01/04/2017 CLINICAL DATA:  Followup pulmonary nodule EXAM: CT CHEST WITH CONTRAST TECHNIQUE: Multidetector CT imaging of the chest was performed during intravenous contrast administration. CONTRAST:  89mL ISOVUE-300 IOPAMIDOL (ISOVUE-300) INJECTION 61% COMPARISON:  11/24/2016 FINDINGS: Cardiovascular: The heart size is normal. There is aortic atherosclerosis. Calcification in the LAD, left circumflex and RCA coronary artery is noted. Mediastinum/Nodes: No enlarged mediastinal, hilar, or axillary lymph nodes. Thyroid gland, trachea, and  esophagus demonstrate no significant findings. Lungs/Pleura: Mild to moderate changes of centrilobular emphysema. The pulmonary nodule within the medial aspect of the anterior right upper lobe is again noted. Mean diameter 1 cm. Stable when compared with previous exam. No new nodules. Upper Abdomen: 11 mm cyst within the anterior liver identified. Unchanged from previous exam. No acute abnormality. Musculoskeletal: No aggressive lytic or sclerotic bone lesions identified. 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 IMPRESSION: 1. Pulmonary nodule in the right upper lobe is stable measuring 1 cm. Continued interval follow-up is required to confirm at least 24 months of no growth. Non-contrast chest CT at 6-12 months (from 11/10/2016) is recommended. 2. Aortic Atherosclerosis (ICD10-I70.0) and Emphysema (ICD10-J43.9). 3. Multi vessel coronary artery calcifications. Electronically Signed   By: Kerby Moors M.D.   On: 01/04/2017 08:32   Dg Chest Port 1 View  Result Date: 01/17/2017 CLINICAL DATA:  Right upper lobe wedge resection for lung cancer. EXAM: PORTABLE CHEST 1 VIEW COMPARISON:  01/15/2017 and prior exams FINDINGS: The lower right thoracostomy tube has been removed and the upper right thoracostomy tube is unchanged. There is no evidence of pneumothorax. Right hemithorax volume loss/surgical change again noted with un chain mild right basilar atelectasis. The left lung is clear. There is no evidence of pulmonary edema IMPRESSION: Removal of 1 of the right thoracostomy tubes without other significant change. No evidence of pneumothorax. Electronically Signed   By: Margarette Canada M.D.   On: 01/17/2017 08:30   Dg Chest Port 1 View  Result Date: 01/14/2017 CLINICAL DATA:  Status post right-sided thoracoscopy with wide red wedge resection preoperative examination prior to bronchoscopy. EXAM: PORTABLE CHEST 1 VIEW COMPARISON:  Chest x-ray of January 11, 2017 FINDINGS: There remains an  approximately 5% or less right-sided pneumothorax. There are 2 right-sided chest tubes present. The tip of the upper tube lies in the apex overlying the posteromedial aspect of the third rib. The lower tube projects in the medial cardio phrenic angle. There is patchy density at the left lung base. The heart is top-normal in size. The pulmonary vascularity is mildly engorged. There is calcification in the wall of the aortic arch. The bony thorax exhibits no acute abnormality. IMPRESSION: 5% or less right apical pneumothorax. Two chest tubes are present. Patchy density lateral to the cardiac apex likely reflects subsegmental atelectasis. Top-normal cardiac size with mild central pulmonary vascular prominence. Thoracic aortic atherosclerosis. Electronically  Signed   By: David  Martinique M.D.   On: 01/14/2017 14:46    EKG: Atrial fibrillation with controlled ventricular rate and nonspecific ST and T-wave changes  Weights: Filed Weights   01/14/17 1556  Weight: 95.6 kg (210 lb 12.2 oz)     Physical Exam: Blood pressure 135/79, pulse (!) 52, temperature 97.6 F (36.4 C), resp. rate 20, height 5\' 11"  (1.803 m), weight 95.6 kg (210 lb 12.2 oz), SpO2 97 %. Body mass index is 29.4 kg/m. General: Well developed, well nourished, in no acute distress. Head eyes ears nose throat: Normocephalic, atraumatic, sclera non-icteric, no xanthomas, nares are without discharge. No apparent thyromegaly and/or mass  Lungs: Normal respiratory effort.  Few wheezes, no rales, no rhonchi.  Heart: Irregular with normal S1 S2. no murmur gallop, no rub, PMI is normal size and placement, carotid upstroke normal without bruit, jugular venous pressure is normal Abdomen: Soft, non-tender, non-distended with normoactive bowel sounds. No hepatomegaly. No rebound/guarding. No obvious abdominal masses. Abdominal aorta is normal size without bruit Extremities: No edema. no cyanosis, no clubbing, no ulcers  Peripheral : 2+ bilateral upper  extremity pulses, 2+ bilateral femoral pulses, 2+ bilateral dorsal pedal pulse Neuro: Alert and oriented. No facial asymmetry. No focal deficit. Moves all extremities spontaneously. Musculoskeletal: Normal muscle tone without kyphosis Psych:  Responds to questions appropriately with a normal affect.    Assessment: 71 year old male with laryngeal and lung cancer and no other cardiovascular disease issues with wrench resection surgery and issues with new onset nonvalvular atrial fibrillation with controlled ventricular rate  Plan: 1. No additional medication management at this time for heart rate control which appears to be relatively stable 2. Begin ambulation and follow for heart rate control and additional beta blocker low dose if necessary 3. Anticoagulation for further risk reduction in stroke with atrial fibrillation when patient fully healed from surgery and bleeding risk is low 4. No further cardiac diagnostics necessary at this time 5. Follow-up with outpatient at this week for further adjustments of medication treatment options  Signed, Corey Skains M.D. [facility] 01/17/2017, 2:33 PM

## 2017-01-17 NOTE — Progress Notes (Signed)
POD # 3 SOB improved CXR reviewed  Lungs well aerated, no ptx AVSS Went into A fib  HR controlled D/W Cards in detail yesterday and consult is pending  PE NAD  Chest: CT in place, serous . No Airleak. Incision c/d/i.  A/P Doing well Mobilize pulm toilet CT water seal CXR in am

## 2017-01-17 NOTE — Progress Notes (Signed)
Patient continues to be nauseated.  He says it is worse when I placed the nitro patch on him.  I informed dr pabon who said to discontinue nitro patch.  Per Dr Dahlia Byes request I notified Dr Nehemiah Massed of the pending cardiology consult and he plans to see the patient this afternoon

## 2017-01-18 ENCOUNTER — Inpatient Hospital Stay: Payer: PPO

## 2017-01-18 ENCOUNTER — Telehealth: Payer: Self-pay

## 2017-01-18 IMAGING — DX DG CHEST 1V PORT
1 series · 1 of 1 positions shown · non-contrast
Comparison: [DATE]

CLINICAL DATA: Status post thoracotomy

EXAM:
PORTABLE CHEST 1 VIEW

[chest ap]
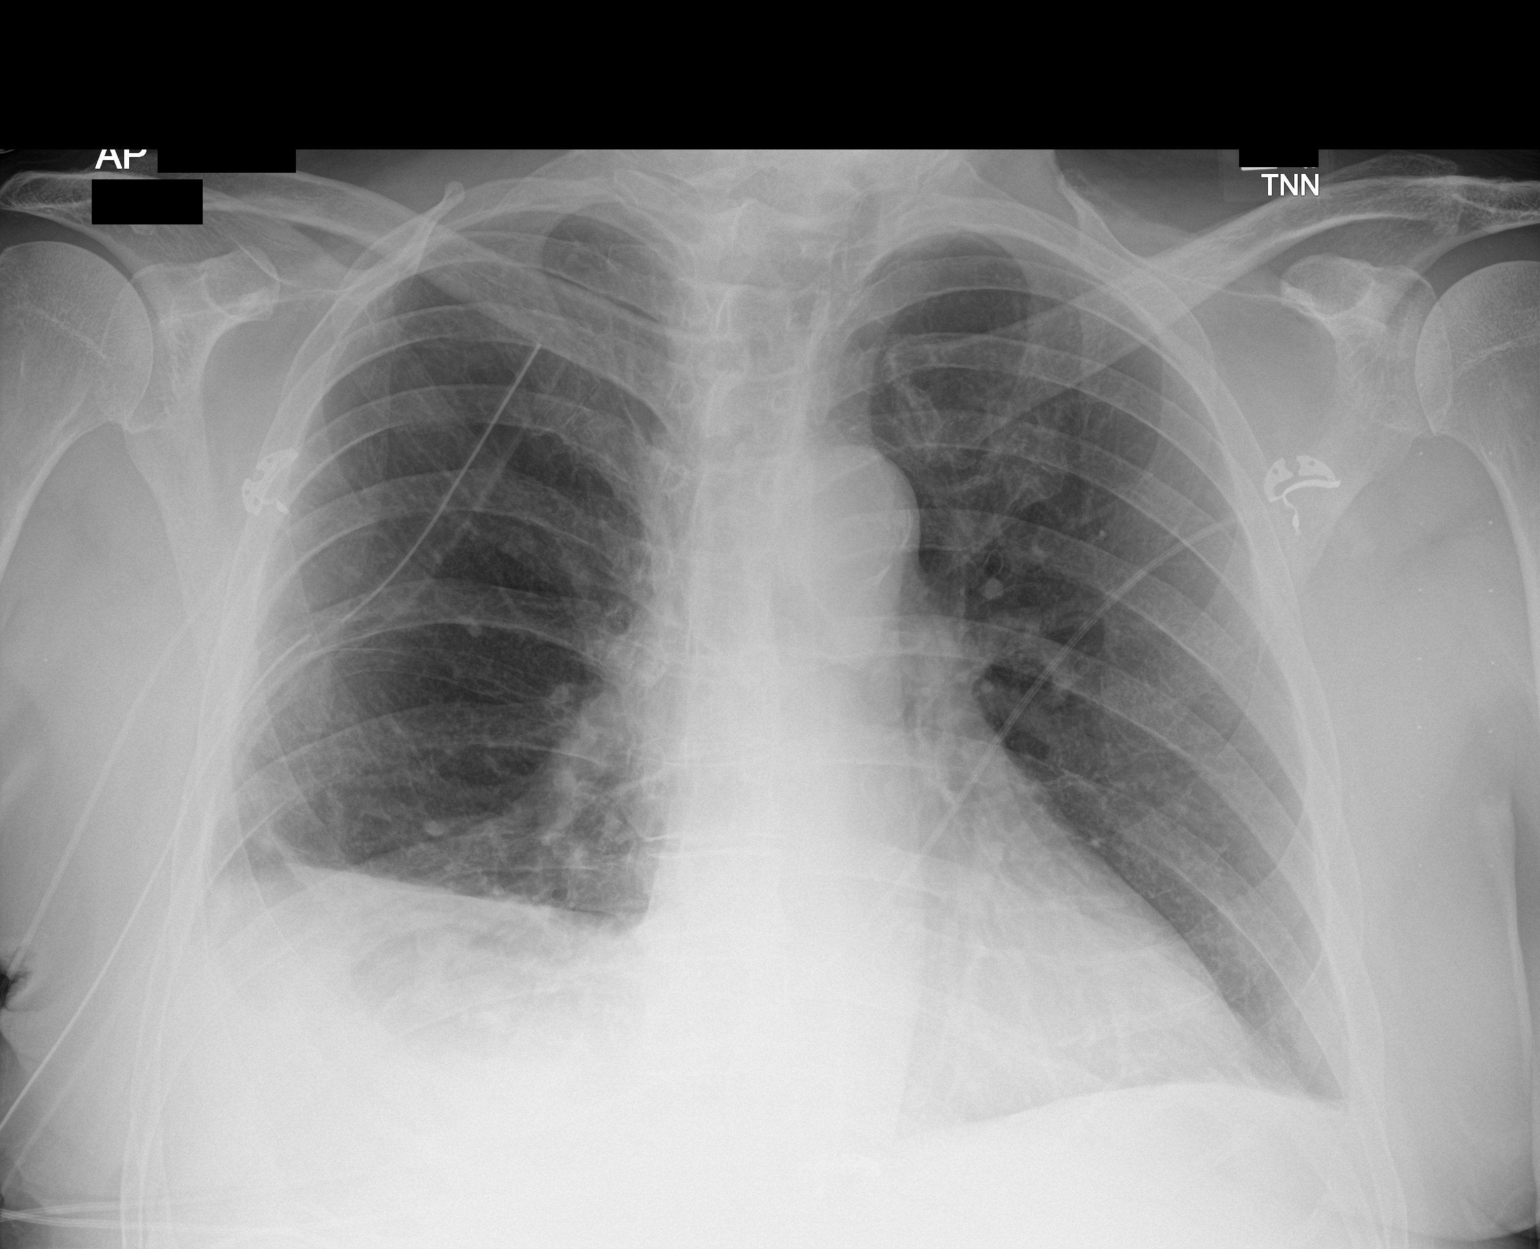

[1 of 1 positions shown; findings below may reference images not displayed]

FINDINGS: Cardiac shadow is stable. Aortic calcifications are again seen.
Right thoracostomy catheter is again noted. No pneumothorax is seen.
Elevation the right hemidiaphragm with mild right basilar
atelectasis is noted.
IMPRESSION: No evidence of right pneumothorax.

Persistent right basilar atelectasis is noted.

## 2017-01-18 MED ORDER — PROCHLORPERAZINE MALEATE 5 MG PO TABS: 5.0000 mg | ORAL_TABLET | Freq: Three times a day (TID) | ORAL | 0 refills | Status: DC | PRN

## 2017-01-18 MED ORDER — DOCUSATE SODIUM 100 MG PO CAPS: 200.0000 mg | ORAL_CAPSULE | Freq: Two times a day (BID) | ORAL | 0 refills | Status: DC

## 2017-01-18 NOTE — Care Management Important Message (Signed)
Important Message  Patient Details  Name: Curtis Andrade. MRN: [id number] Date of Birth: Nov 24, 1945   Medicare Important Message Given:  Yes    Beverly Sessions, RN 01/18/2017, 12:33 PM

## 2017-01-18 NOTE — Care Management (Signed)
Patient discharged home today.  Home health PT recommended by PT.  Patient declines home health services at discharge.  Patient is now on RA.  PCP [place].  [facility]. Patient has a RW in the home if needed.  Patient states that he is independent at baseline, and still drives.  RNCM signing off.

## 2017-01-18 NOTE — Discharge Summary (Signed)
Physician Discharge Summary  Patient ID: Curtis Andrade. MRN: [id number] DOB/AGE: [birth year] 71 y.o.  Admit date: 01/14/2017 Discharge date: 01/18/2017   Discharge Diagnoses:  Active Problems:   Lung mass   Procedures:right thoracoscopy and wedge resection right upper lobe mass  Hospital Course: this patient was admitted to the hospital after undergoing a right thoracoscopy and wedge resection of a right upper lobe mass. His chest tubes were removed over the next few days. His postoperative course was complicated by atrial fibrillation with a controlled ventricular response. At the present time he was seen by cardiology who did not recommend any anticoagulation therapy just now and also did not recommend any antiarrhythmics. His heart rate remained in the approximately 80 bpm range. His chest tubes were removed and his wounds were all healing as expected. His chest x-ray on the day of discharge showed only some basilar atelectasis but no pneumothorax or pleural effusion. 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  He will follow-up with cardiology this week. I would also like to get our oncologist to review his case to see if any additional therapy or followup is needed. He will follow-up with me in one week.  Disposition: 01-Home or Self Care  Discharge Instructions    Diet - low sodium heart healthy    Complete by:  As directed    Discharge instructions    Complete by:  As directed    Remove all dressings in 48 hours. After that point youmay wash over the wounds with soap and water.  You may place a small Band-Aid over the wounds if needed.   Increase activity slowly    Complete by:  As directed      Allergies as of 01/18/2017   No Known Allergies     Medication List    STOP taking these medications   naproxen sodium 220 MG tablet Commonly known as:  ANAPROX     TAKE these medications   ASHWAGANDHA PO Take 1 tablet by mouth 2 (two) times  daily.   aspirin EC 81 MG tablet Take 81 mg by mouth daily.   COSAMIN DS PO Take 1 tablet by mouth daily.   docusate sodium 100 MG capsule Commonly known as:  COLACE Take 2 capsules (200 mg total) by mouth 2 (two) times daily.   folic acid 884 MCG tablet Commonly known as:  FOLVITE Take 800 mcg by mouth daily.   multivitamin with minerals Tabs tablet Take 1 tablet by mouth daily. Senior Multivitamin.   Omega-3 1000 MG Caps Take 1,000 mg by mouth daily.   omeprazole 40 MG capsule Commonly known as:  PRILOSEC Take 40 mg by mouth daily before supper.   vitamin C 1000 MG tablet Take 1,000 mg by mouth daily.        [name], MD

## 2017-01-18 NOTE — Progress Notes (Signed)
Patient cleared for D/C by Dr Genevive Bi. Iv's removed. Belongings gathered. Discharge instructions explained. All questions answered. Wheeled to car by volunteers.

## 2017-01-18 NOTE — Progress Notes (Signed)
His only complaint today is that he has some pickups. He's had no significant pain. He's not short of breath.  He was seen today by Dr. Nehemiah Massed for his rate controlled A. Fib. Dr. Nehemiah Massed did not recommend any additional therapy.  His chest tube only put out 40 cc over the last 24 hours. There was no air leak. Chest x-ray today showed no pneumothorax with some right-sided atelectasis.  The final pathology did reveal an adenocarcinoma with negative margins.  I removed his chest tube without incident. His wounds are all clean dry and intact.  We will discharge the patient today. He will come back to see me in one week. He will follow-up with Dr. Towanda Malkin who is his primary cardiologist.  He will also follow-up with oncology. He will see me in one week.  I did provide him with a prescription for Norco 5/325 for his pain medication.  He will contact us if there are any further problems. I did ask him to resume his aspirin.

## 2017-01-18 NOTE — Telephone Encounter (Signed)
Patient's wife called in at this time and was instructed to give patient compazine. However, she did not receive a prescription for this medication for discharge. I clarified this with Dr. Genevive Bi and Compazine 5mg  q8h prn was prescribed and sent to patient's preferred pharmacy.

## 2017-01-18 NOTE — Progress Notes (Signed)
[facility] Encounter Note  Patient: Curtis Andrade. / Admit Date: 01/14/2017 / Date of Encounter: 01/18/2017, 8:40 AM   Subjective: Patient feels relatively well today with no evidence of significant congestive heart failure chest pain or other significant skin status post thoracic surgery without complication. Atrial fibrillation with controlled ventricular rate likely secondary to current stresses of the thoracic surgery.  Review of Systems: Positive for: None Negative for: Vision change, hearing change, syncope, dizziness, nausea, vomiting,diarrhea, bloody stool, stomach pain, cough, congestion, diaphoresis, urinary frequency, urinary pain,skin lesions, skin rashes Others previously listed  Objective: Telemetry: Atrial fibrillation with controlled ventricular rate Physical Exam: Blood pressure (!) 160/83, pulse 83, temperature 97.9 F (36.6 C), temperature source Oral, resp. rate 15, height 5\' 11"  (1.803 m), weight 95.6 kg (210 lb 12.2 oz), SpO2 94 %. Body mass index is 29.4 kg/m. General: Well developed, well nourished, in no acute distress. Head: Normocephalic, atraumatic, sclera non-icteric, no xanthomas, nares are without discharge. Neck: No apparent masses Lungs: Normal respirations with no wheezes, no rhonchi, no rales , some crackles   Heart: Irregular rate and rhythm, normal S1 S2, no murmur, no rub, no gallop, PMI is normal size and placement, carotid upstroke normal without bruit, jugular venous pressure normal Abdomen: Soft, non-tender, non-distended with normoactive bowel sounds. No hepatosplenomegaly. Abdominal aorta is normal size without bruit Extremities: No edema, no clubbing, no cyanosis, no ulcers,  Peripheral: 2+ radial, 2+ femoral, 2+ dorsal pedal pulses Neuro: Alert and oriented. Moves all extremities spontaneously. Psych:  Responds to questions appropriately with a normal affect.   Intake/Output Summary (Last 24 hours) at 01/18/17  0840 Last data filed at 01/18/17 0617  Gross per 24 hour  Intake              360 ml  Output               20 ml  Net              340 ml    Inpatient Medications:  . albuterol  2.5 mg Nebulization BID  . bisacodyl  10 mg Oral Daily  . docusate sodium  200 mg Oral BID  . ketorolac  15 mg Intravenous Q6H  . pantoprazole  80 mg Oral Daily  . traMADol  100 mg Oral Q6H   Infusions:   Labs:  Recent Labs  01/16/17 1459 01/17/17 0444  NA 127* 127*  K 4.0 3.9  CL 92* 89*  CO2 26 28  GLUCOSE 139* 114*  BUN 15 23*  CREATININE 0.93 1.01  CALCIUM 8.4* 8.7*  MG 2.0 2.1   No results for input(s): AST, ALT, ALKPHOS, BILITOT, PROT, ALBUMIN in the last 72 hours.  Recent Labs  01/17/17 0444  WBC 10.3  HGB 15.6  HCT 45.6  MCV 95.4  PLT 178   No results for input(s): CKTOTAL, CKMB, TROPONINI in the last 72 hours. Invalid input(s): POCBNP No results for input(s): HGBA1C in the last 72 hours.   Weights: Filed Weights   01/14/17 1556  Weight: 95.6 kg (210 lb 12.2 oz)     Radiology/Studies:  Dg Chest 2 View  Result Date: 01/16/2017 CLINICAL DATA:  Postoperative.  Chest tubes. EXAM: CHEST  2 VIEW COMPARISON:  01/14/2017 FINDINGS: Shallow inspiration with atelectasis in the lung bases. Colonic interposition under the right hemidiaphragm. Two right chest tubes are present without change in position since the previous study. Minimal residual apical pneumothorax. Heart size is normal. Mild pulmonary vascular  congestion. No consolidation. Calcification of the aorta. IMPRESSION: Two right chest tubes are unchanged in position. Minimal residual apical pneumothorax. Suggestion of mild pulmonary vascular congestion. No focal consolidation. Shallow inspiration with atelectasis in the lung bases. Electronically Signed   By: Lucienne Capers M.D.   On: 01/16/2017 00:28   Dg Chest 2 View  Result Date: 01/12/2017 CLINICAL DATA:  71 year old male preoperative study for right lung surgery.  EXAM: CHEST  2 VIEW COMPARISON:  Chest CT 01/04/2017.  PET-CT 11/24/2016, and earlier. FINDINGS: Stable lung volumes. Mediastinal contours remain normal. The medial anterior 10 mm lung nodule is poorly visible radiographically. Stable lung parenchyma with mild coarse increased interstitial markings bilaterally. No pulmonary edema or pleural effusion. No new pulmonary opacity. No acute osseous abnormality identified. Negative visible bowel gas pattern. IMPRESSION: 1. Poor radiographic visualization of the medial and anterior right upper lobe lung nodule. 2.  No acute cardiopulmonary abnormality. Electronically Signed   By: Genevie Ann M.D.   On: 01/12/2017 07:51   Ct Chest W Contrast  Result Date: 01/04/2017 CLINICAL DATA:  Followup pulmonary nodule EXAM: CT CHEST WITH CONTRAST TECHNIQUE: Multidetector CT imaging of the chest was performed during intravenous contrast administration. CONTRAST:  74mL ISOVUE-300 IOPAMIDOL (ISOVUE-300) INJECTION 61% COMPARISON:  11/24/2016 FINDINGS: Cardiovascular: The heart size is normal. There is aortic atherosclerosis. Calcification in the LAD, left circumflex and RCA coronary artery is noted. Mediastinum/Nodes: No enlarged mediastinal, hilar, or axillary lymph nodes. Thyroid gland, trachea, and esophagus demonstrate no significant findings. Lungs/Pleura: Mild to moderate changes of centrilobular emphysema. The pulmonary nodule within the medial aspect of the anterior right upper lobe is again noted. Mean diameter 1 cm. Stable when compared with previous exam. No new nodules. Upper Abdomen: 11 mm cyst within the anterior liver identified. Unchanged from previous exam. No acute abnormality. Musculoskeletal: No aggressive lytic or sclerotic bone lesions identified. 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 IMPRESSION: 1. Pulmonary nodule in the right upper lobe is stable measuring 1 cm. Continued interval follow-up is required to confirm at least 24 months of no  growth. Non-contrast chest CT at 6-12 months (from 11/10/2016) is recommended. 2. Aortic Atherosclerosis (ICD10-I70.0) and Emphysema (ICD10-J43.9). 3. Multi vessel coronary artery calcifications. Electronically Signed   By: Kerby Moors M.D.   On: 01/04/2017 08:32   Dg Chest Port 1 View  Result Date: 01/18/2017 CLINICAL DATA:  Status post thoracotomy EXAM: PORTABLE CHEST 1 VIEW COMPARISON:  01/17/2017 FINDINGS: Cardiac shadow is stable. Aortic calcifications are again seen. Right thoracostomy catheter is again noted. No pneumothorax is seen. Elevation the right hemidiaphragm with mild right basilar atelectasis is noted. IMPRESSION: No evidence of right pneumothorax. Persistent right basilar atelectasis is noted. Electronically Signed   By: Inez Catalina M.D.   On: 01/18/2017 07:41   Dg Chest Port 1 View  Result Date: 01/17/2017 CLINICAL DATA:  Right upper lobe wedge resection for lung cancer. EXAM: PORTABLE CHEST 1 VIEW COMPARISON:  01/15/2017 and prior exams FINDINGS: The lower right thoracostomy tube has been removed and the upper right thoracostomy tube is unchanged. There is no evidence of pneumothorax. Right hemithorax volume loss/surgical change again noted with un chain mild right basilar atelectasis. The left lung is clear. There is no evidence of pulmonary edema IMPRESSION: Removal of 1 of the right thoracostomy tubes without other significant change. No evidence of pneumothorax. Electronically Signed   By: Margarette Canada M.D.   On: 01/17/2017 08:30   Dg Chest Port 1 View  Result Date:  01/14/2017 CLINICAL DATA:  Status post right-sided thoracoscopy with wide red wedge resection preoperative examination prior to bronchoscopy. EXAM: PORTABLE CHEST 1 VIEW COMPARISON:  Chest x-ray of January 11, 2017 FINDINGS: There remains an approximately 5% or less right-sided pneumothorax. There are 2 right-sided chest tubes present. The tip of the upper tube lies in the apex overlying the posteromedial aspect  of the third rib. The lower tube projects in the medial cardio phrenic angle. There is patchy density at the left lung base. The heart is top-normal in size. The pulmonary vascularity is mildly engorged. There is calcification in the wall of the aortic arch. The bony thorax exhibits no acute abnormality. IMPRESSION: 5% or less right apical pneumothorax. Two chest tubes are present. Patchy density lateral to the cardiac apex likely reflects subsegmental atelectasis. Top-normal cardiac size with mild central pulmonary vascular prominence. Thoracic aortic atherosclerosis. Electronically Signed   By: David  Martinique M.D.   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 No additional medication management at this time due to heart rate control appropriate with atrial fibrillation 2. Consideration of low-dose beta blocker 12.5 mg of metoprolol if necessary for heart rate control after ambulation 3. Anticoagulation for further risk reduction in stroke with atrial fibrillation after full recovery from thoracic surgery to reduce bleeding risks 4. Follow-up next week for adjustments of medication  Signed, Serafina Royals M.D. FACC

## 2017-01-19 ENCOUNTER — Telehealth: Payer: Self-pay

## 2017-01-19 MED ORDER — GABAPENTIN 100 MG PO CAPS: 100.0000 mg | ORAL_CAPSULE | Freq: Three times a day (TID) | ORAL | 0 refills | Status: DC

## 2017-01-19 NOTE — Telephone Encounter (Signed)
Please place referral to Oncology. Dr. Rogue Bussing is requested please.

## 2017-01-19 NOTE — Telephone Encounter (Signed)
Post-op call made to patient at this time. Spoke with Patient. Post-op interview questions below.  1. How are you feeling? Very well  2. Is your pain controlled? Yes, I have very minimal soreness at my incision.  3. What are you doing for the pain? Nothing has been needed for the past 24 hours. Encouraged use of Ibuprofen over all other medications if soreness persists or increases. 800mg  TID x 5-7 days was recommended.  4. Are you having any Nausea or Vomiting? No  5. Are you having any Fever or Chills? No  6. Are you having any Constipation or Diarrhea? No  7. Is there any Swelling or Bruising you are concerned about? I have some bruising but I'm not concerned about it.  8. Do you have any questions or concerns at this time? I continue to have hiccups around the clock. I have tried the compazine and it has helped very little.   Discussion: Patient does state that he has Productive Cough for clear mucous, he is using IS with a goal of 1800 currently and I did encourage continued use of this 10 times each hour while awake. Denies SOB and chest pain but does complain of minimal soreness at incision site. I spoke with Dr. Genevive Bi and he has ordered Gabapentin 100mg  TID for the hiccups. Patient will increase to 200mg  TID in 24 hours is 100mg  is ineffective. This has been sent to pharmacy and patient has been notified. He will discontinue Compazine at this time.

## 2017-01-19 NOTE — Telephone Encounter (Signed)
Referral has been sent.

## 2017-01-21 DIAGNOSIS — Z87891 Personal history of nicotine dependence: Secondary | ICD-10-CM | POA: Diagnosis not present

## 2017-01-21 DIAGNOSIS — I4891 Unspecified atrial fibrillation: Secondary | ICD-10-CM | POA: Diagnosis not present

## 2017-01-21 DIAGNOSIS — Z8589 Personal history of malignant neoplasm of other organs and systems: Secondary | ICD-10-CM | POA: Diagnosis not present

## 2017-01-21 DIAGNOSIS — C3490 Malignant neoplasm of unspecified part of unspecified bronchus or lung: Secondary | ICD-10-CM | POA: Diagnosis not present

## 2017-01-21 DIAGNOSIS — I480 Paroxysmal atrial fibrillation: Secondary | ICD-10-CM | POA: Diagnosis not present

## 2017-01-21 DIAGNOSIS — Z872 Personal history of diseases of the skin and subcutaneous tissue: Secondary | ICD-10-CM | POA: Diagnosis not present

## 2017-01-25 ENCOUNTER — Ambulatory Visit (INDEPENDENT_AMBULATORY_CARE_PROVIDER_SITE_OTHER): Payer: PPO | Admitting: Cardiothoracic Surgery

## 2017-01-25 ENCOUNTER — Ambulatory Visit
Admission: RE | Admit: 2017-01-25 | Discharge: 2017-01-25 | Disposition: A | Discharge status: Home / Self Care | Payer: PPO | Source: Ambulatory Visit | Attending: Cardiothoracic Surgery | Admitting: Cardiothoracic Surgery

## 2017-01-25 ENCOUNTER — Encounter: Payer: Self-pay | Admitting: Cardiothoracic Surgery

## 2017-01-25 VITALS — BP 171/91 | HR 76 | Temp 98.1°F | Resp 14 | Ht 71.0 in | Wt 209.2 lb

## 2017-01-25 DIAGNOSIS — C3491 Malignant neoplasm of unspecified part of right bronchus or lung: Secondary | ICD-10-CM | POA: Diagnosis not present

## 2017-01-25 DIAGNOSIS — J9811 Atelectasis: Secondary | ICD-10-CM | POA: Diagnosis not present

## 2017-01-25 IMAGING — CR DG CHEST 2V
2 series · 2 of 2 positions shown · non-contrast
Comparison: [DATE].

CLINICAL DATA: Lung wedge resection.

EXAM:
CHEST  2 VIEW

[chest pa]
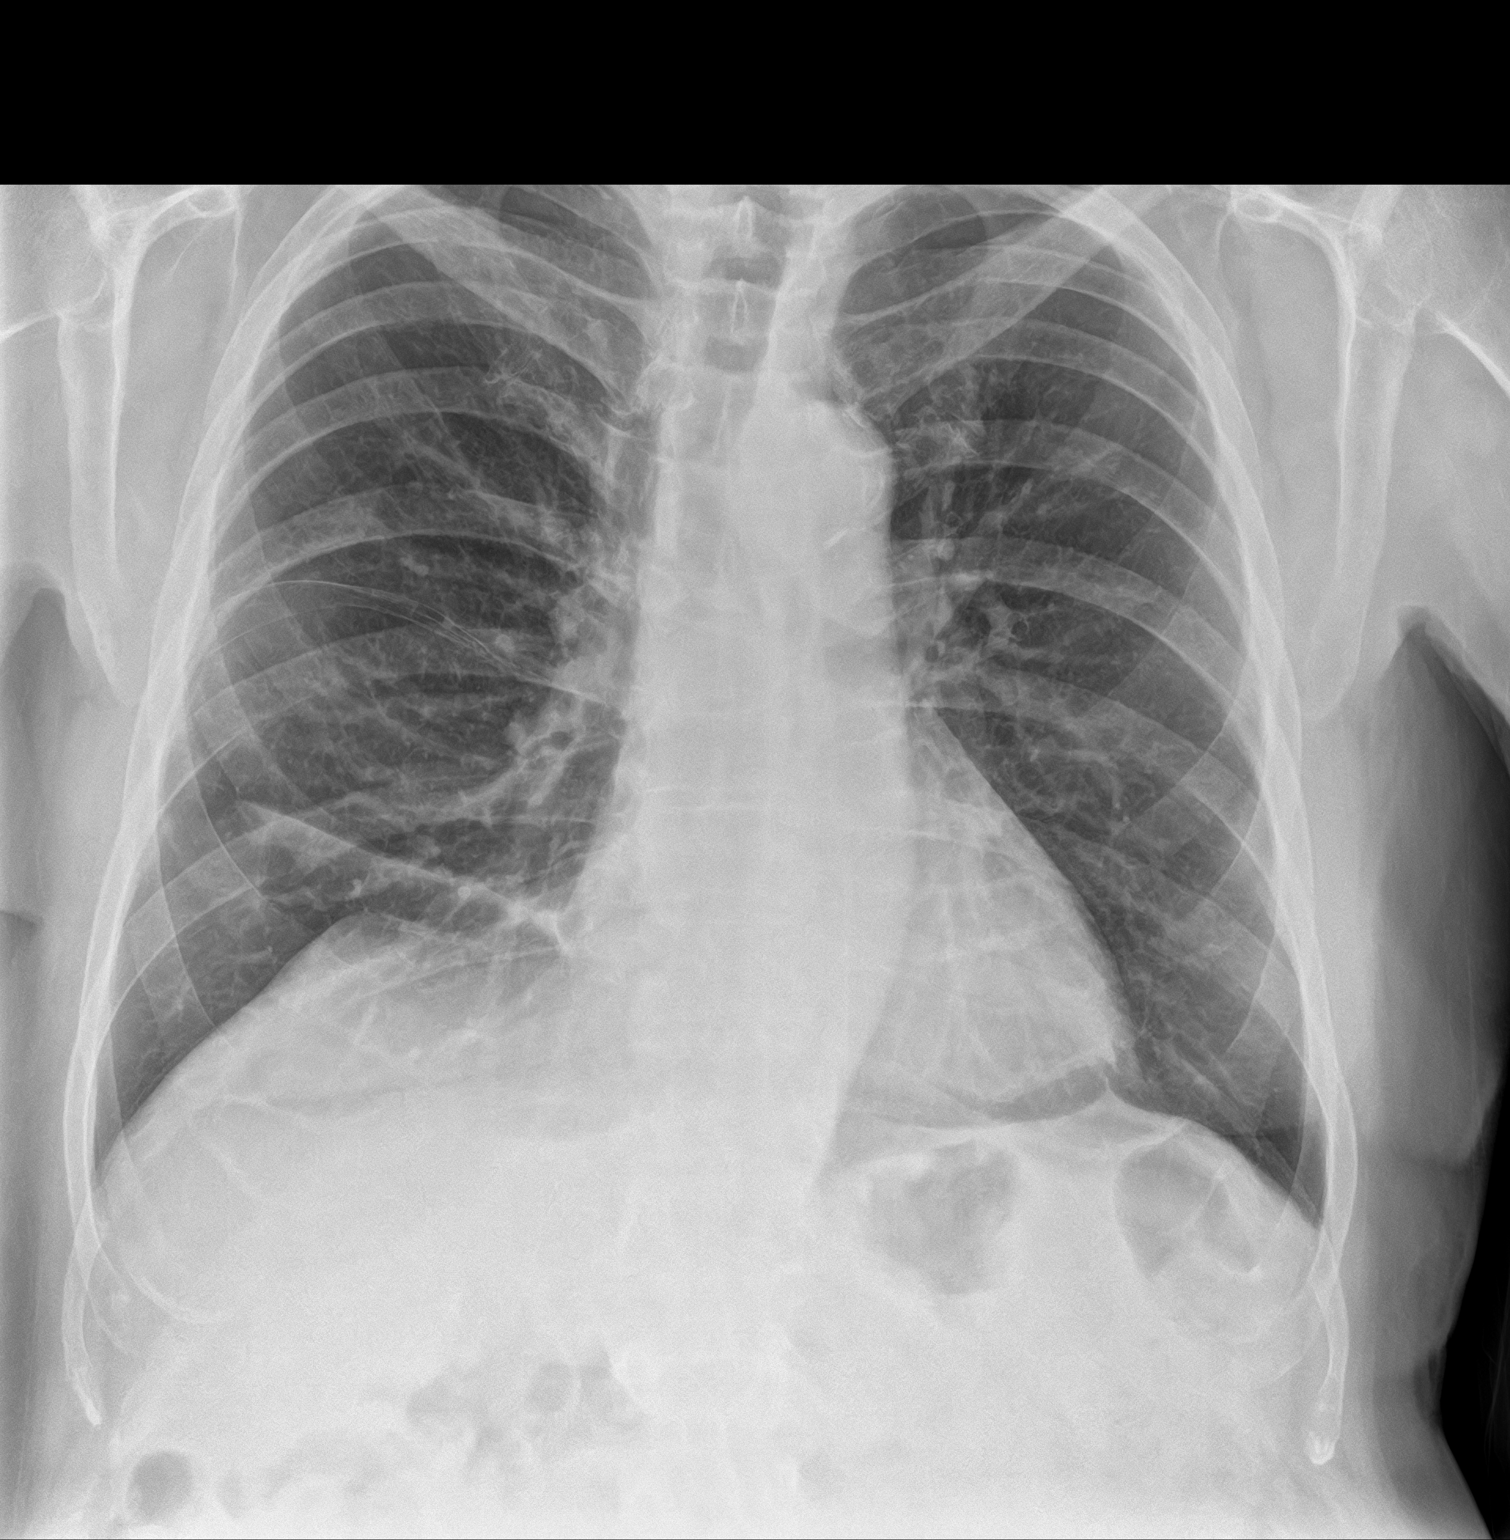

[chest lat]
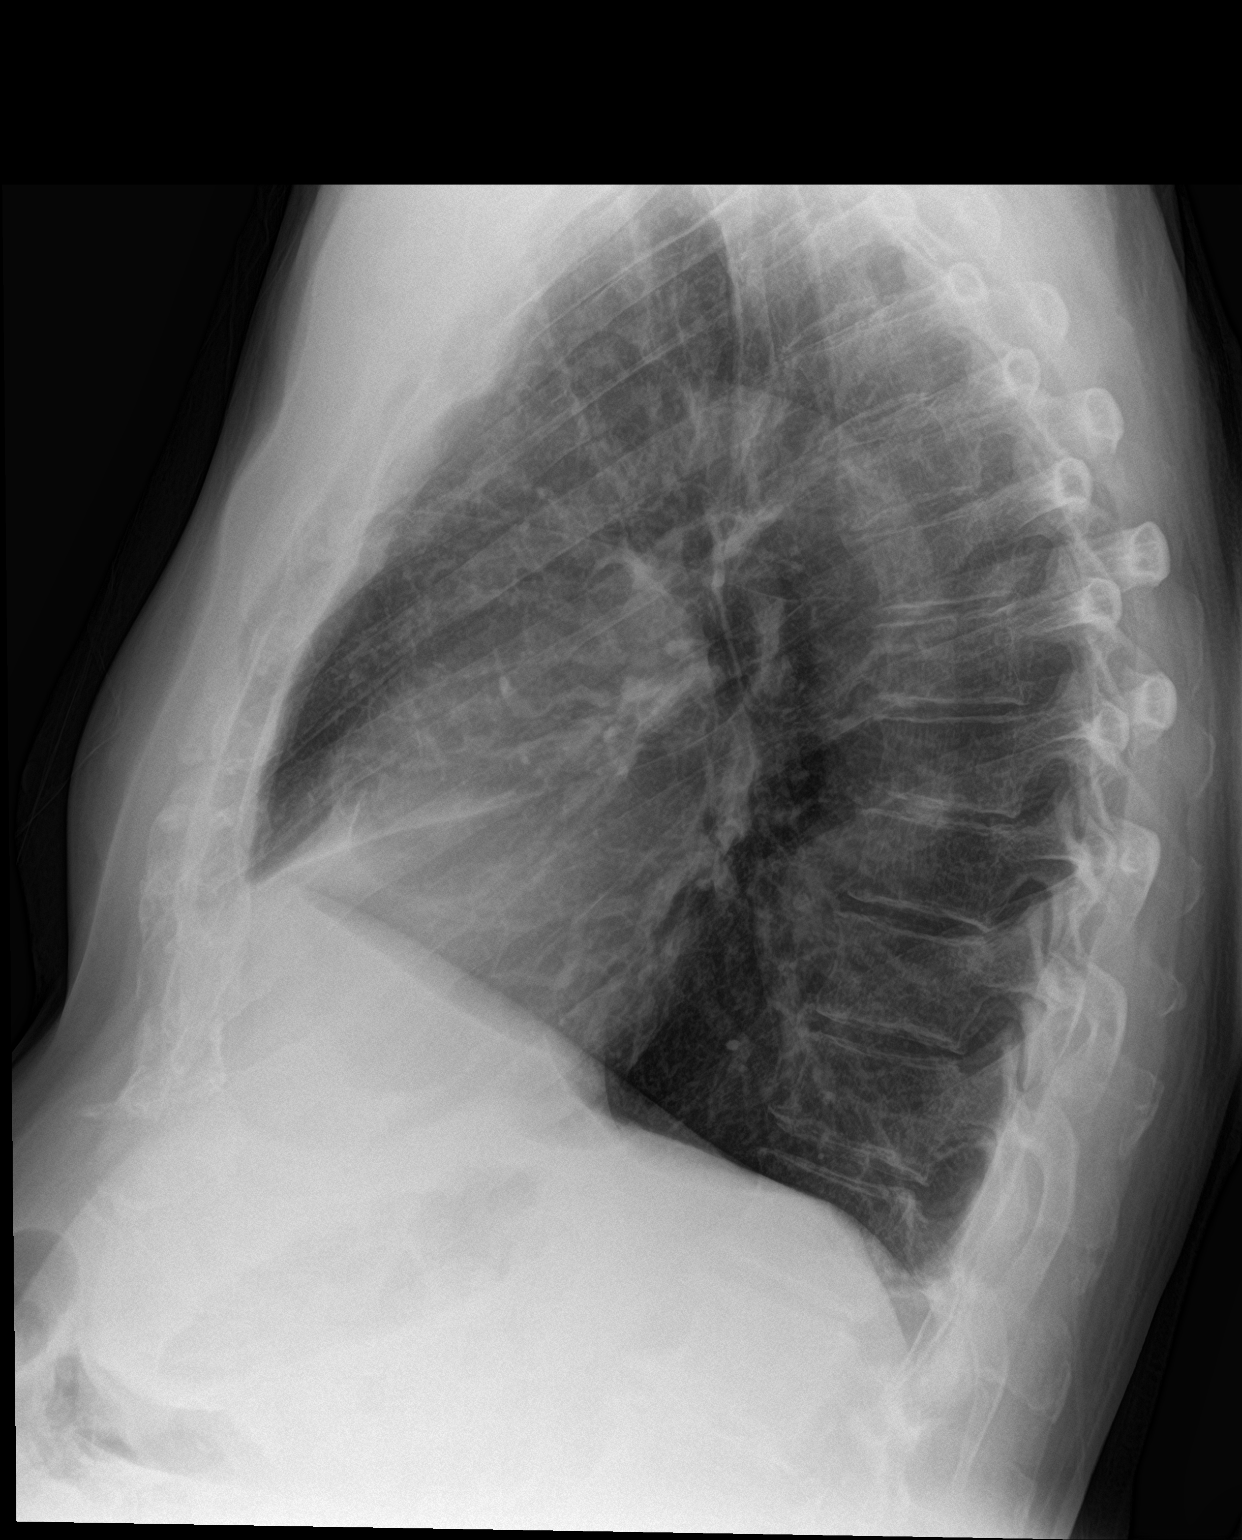

[2 of 2 positions shown; findings below may reference images not displayed]

FINDINGS: RIGHT chest tube removed. Mild atelectasis and fluid in the RIGHT
lung, but no pneumothorax. Normal cardiomediastinal silhouette. No
significant effusion or consolidation.
IMPRESSION: RIGHT chest tube removed.  No pneumothorax.

## 2017-01-25 NOTE — Patient Instructions (Signed)
We will send you today for Chest X-ray. You will go to the [facility] for this to be done. Call back in today and we will review results.  I will let you know as soon as I have the Oncology Appointment with Dr. Rogue Bussing.  Do not drive for 3 more weeks.  We will see you back in 1 month.

## 2017-01-25 NOTE — Progress Notes (Signed)
Curtis Andrade returns today in follow-up.  He is now about a week and a half out from his right thoracoscopy and wedge resection of a right upper lobe adenocarcinoma.  He is done very well since he has been at home.  His hiccups have abated with the use of gabapentin.  He has no pain.  He did see Dr. Towanda Malkin who placed the patient on Eliquis for 30 days.  This was in response to his postoperative A. fib.  Patient states that he feels very well without any shortness of breath, or fevers.  His lungs are clear bilaterally with the exception at the right base where they are slightly diminished.  His heart is regular.  His wounds are all healing as expected.  I removed all the sutures.  We will obtain a chest x-ray today.  He will contact the office later this week to review the results of the x-ray.  I will see him back again in 1 month.  Will make an appointment for him to follow-up with our oncologist for their opinion regarding any additional surveillance or treatment.

## 2017-01-26 ENCOUNTER — Telehealth: Payer: Self-pay

## 2017-01-26 ENCOUNTER — Other Ambulatory Visit: Payer: Self-pay

## 2017-01-26 DIAGNOSIS — R918 Other nonspecific abnormal finding of lung field: Secondary | ICD-10-CM

## 2017-01-26 NOTE — Telephone Encounter (Signed)
Appointment made for patient to see Dr. Rogue Bussing on 02/04/17 in the [facility] at 0915am. Call has been made to patient and all appointment information was given. He was encouraged to call back with any further questions or concerns prior to his next appointment. He verbalizes understanding of conversation.

## 2017-02-01 ENCOUNTER — Telehealth: Payer: Self-pay

## 2017-02-01 ENCOUNTER — Encounter: Payer: Self-pay | Admitting: Surgery

## 2017-02-01 ENCOUNTER — Ambulatory Visit (INDEPENDENT_AMBULATORY_CARE_PROVIDER_SITE_OTHER): Payer: PPO | Admitting: Surgery

## 2017-02-01 VITALS — BP 154/90 | HR 81 | Temp 98.0°F | Ht 71.0 in | Wt 209.0 lb

## 2017-02-01 DIAGNOSIS — Z09 Encounter for follow-up examination after completed treatment for conditions other than malignant neoplasm: Secondary | ICD-10-CM

## 2017-02-01 MED ORDER — AMOXICILLIN-POT CLAVULANATE 875-125 MG PO TABS: 1.0000 | ORAL_TABLET | Freq: Two times a day (BID) | ORAL | 0 refills | Status: AC

## 2017-02-01 NOTE — Patient Instructions (Addendum)
We have sent in an antibiotic to [facility].  Please take as written and until all medication is completed  You may shower normally and rinse the area clean of soap. Pat area dry and replace with large bandage.  We will see you back in office as listed below.

## 2017-02-01 NOTE — Progress Notes (Signed)
S/p Right VATS wedge resection Doing well Had a fib then a flutter , seen by cards Some swelling around his post incision No fevers Breathing well  PE NAD Posterior incision w erythema and edema, minimal /scant purulent drainage Lungs well expanded  A/p Doing well Superficial infection, treat w augmentin for 10 days RTC next week w Dr. Genevive Bi

## 2017-02-01 NOTE — Telephone Encounter (Signed)
Patient called wanting to let us know that one of his incisions was red. However, he denied having a fever, chills, drainage coming from the incision. Since patient wants to be seen by a physician, I told him to come in at 2:00 PM today.

## 2017-02-03 ENCOUNTER — Encounter: Payer: Self-pay | Admitting: Cardiothoracic Surgery

## 2017-02-04 ENCOUNTER — Encounter: Payer: Self-pay | Admitting: Surgery

## 2017-02-04 ENCOUNTER — Ambulatory Visit (INDEPENDENT_AMBULATORY_CARE_PROVIDER_SITE_OTHER): Payer: PPO | Admitting: Surgery

## 2017-02-04 ENCOUNTER — Other Ambulatory Visit
Admission: RE | Admit: 2017-02-04 | Discharge: 2017-02-04 | Disposition: A | Discharge status: Home / Self Care | Payer: PPO | Source: Ambulatory Visit | Attending: Surgery | Admitting: Surgery

## 2017-02-04 ENCOUNTER — Encounter: Payer: Self-pay | Admitting: *Deleted

## 2017-02-04 ENCOUNTER — Inpatient Hospital Stay: Payer: PPO | Attending: Internal Medicine | Admitting: Internal Medicine

## 2017-02-04 VITALS — BP 175/82 | HR 74 | Temp 97.9°F | Ht 71.0 in | Wt 209.4 lb

## 2017-02-04 DIAGNOSIS — C3491 Malignant neoplasm of unspecified part of right bronchus or lung: Secondary | ICD-10-CM | POA: Diagnosis not present

## 2017-02-04 DIAGNOSIS — Z09 Encounter for follow-up examination after completed treatment for conditions other than malignant neoplasm: Secondary | ICD-10-CM

## 2017-02-04 DIAGNOSIS — L409 Psoriasis, unspecified: Secondary | ICD-10-CM | POA: Diagnosis not present

## 2017-02-04 DIAGNOSIS — I498 Other specified cardiac arrhythmias: Secondary | ICD-10-CM | POA: Diagnosis not present

## 2017-02-04 DIAGNOSIS — Z7901 Long term (current) use of anticoagulants: Secondary | ICD-10-CM | POA: Diagnosis not present

## 2017-02-04 DIAGNOSIS — Z8521 Personal history of malignant neoplasm of larynx: Secondary | ICD-10-CM | POA: Diagnosis not present

## 2017-02-04 DIAGNOSIS — C3411 Malignant neoplasm of upper lobe, right bronchus or lung: Secondary | ICD-10-CM | POA: Insufficient documentation

## 2017-02-04 DIAGNOSIS — Z7982 Long term (current) use of aspirin: Secondary | ICD-10-CM | POA: Diagnosis not present

## 2017-02-04 DIAGNOSIS — Z87891 Personal history of nicotine dependence: Secondary | ICD-10-CM | POA: Diagnosis not present

## 2017-02-04 DIAGNOSIS — Z79899 Other long term (current) drug therapy: Secondary | ICD-10-CM | POA: Diagnosis not present

## 2017-02-04 DIAGNOSIS — Z923 Personal history of irradiation: Secondary | ICD-10-CM | POA: Diagnosis not present

## 2017-02-04 DIAGNOSIS — I700 Atherosclerosis of aorta: Secondary | ICD-10-CM | POA: Diagnosis not present

## 2017-02-04 LAB — COMPREHENSIVE METABOLIC PANEL
ALBUMIN: 3.7 g/dL (ref 3.5–5.0)
ALK PHOS: 57 U/L (ref 38–126)
ALT: 21 U/L (ref 17–63)
ANION GAP: 9 (ref 5–15)
AST: 22 U/L (ref 15–41)
BUN: 17 mg/dL (ref 6–20)
CHLORIDE: 102 mmol/L (ref 101–111)
CO2: 26 mmol/L (ref 22–32)
Calcium: 9.2 mg/dL (ref 8.9–10.3)
Creatinine, Ser: 1.1 mg/dL (ref 0.61–1.24)
GFR calc non Af Amer: >60 mL/min (ref >60)
GLUCOSE: 102 mg/dL — AB (ref 65–99)
Potassium: 4.4 mmol/L (ref 3.5–5.1)
Sodium: 137 mmol/L (ref 135–145)
Total Bilirubin: 0.7 mg/dL (ref 0.3–1.2)
Total Protein: 7.2 g/dL (ref 6.5–8.1)

## 2017-02-04 LAB — CBC WITH DIFFERENTIAL/PLATELET
Basophils Absolute: 0 10*3/uL (ref 0–0.1)
Basophils Relative: 1 %
Eosinophils Absolute: 0.6 10*3/uL (ref 0–0.7)
Eosinophils Relative: 9 %
HEMATOCRIT: 41.9 % (ref 40.0–52.0)
HEMOGLOBIN: 13.8 g/dL (ref 13.0–18.0)
LYMPHS ABS: 1.4 10*3/uL (ref 1.0–3.6)
LYMPHS PCT: 21 %
MCH: 31.3 pg (ref 26.0–34.0)
MCHC: 32.9 g/dL (ref 32.0–36.0)
MCV: 95.2 fL (ref 80.0–100.0)
MONO ABS: 0.5 10*3/uL (ref 0.2–1.0)
MONOS PCT: 7 %
NEUTROS ABS: 4.3 10*3/uL (ref 1.4–6.5)
NEUTROS PCT: 62 %
Platelets: 228 10*3/uL (ref 150–440)
RBC: 4.4 MIL/uL (ref 4.40–5.90)
RDW: 14.4 % (ref 11.5–14.5)
WBC: 6.9 10*3/uL (ref 3.8–10.6)

## 2017-02-04 MED ORDER — SULFAMETHOXAZOLE-TRIMETHOPRIM 800-160 MG PO TABS: 1.0000 | ORAL_TABLET | Freq: Two times a day (BID) | ORAL | 0 refills | Status: DC

## 2017-02-04 NOTE — Patient Instructions (Signed)
Please continue the packing /dressing changes twice daily. You may wash this area when you take a shower.   Please start there Bactrim DS today.  You will need to arrive at [facility] one hour prior to your appointment with Dr.Oaks to have the lab work and chest xray done prior  to seeing Dr.Oaks next week.  If you have any questions or concerns please call our office.

## 2017-02-04 NOTE — Progress Notes (Signed)
Patient here for new consult with Dr. Rogue Bussing. Newly dx lung ca s/p wedge resection.  H/o larynx cancer. Pt reports that he is not taking any pain medication s/p wedge resection. He states that he is being treated with augumentin for delayed wound healing of posterior chest tube site. There is a purulent drainage at this site. He has an apt with the surgeon's office today for further evaluation.

## 2017-02-04 NOTE — Telephone Encounter (Signed)
ERROR

## 2017-02-04 NOTE — Progress Notes (Signed)
  Oncology Nurse Navigator Documentation  Navigator Location: [facility]Med Onc (02/04/17 1000) Referral date to RadOnc/MedOnc: 01/25/17 (02/04/17 1000) )Navigator Encounter Type: Initial MedOnc (02/04/17 1000)   Abnormal Finding Date: 11/11/16 (02/04/17 1000) Confirmed Diagnosis Date: 01/15/17 (02/04/17 1000) Surgery Date: 01/14/17 (02/04/17 1000)               Treatment Phase: Follow-up (02/04/17 1000) Barriers/Navigation Needs: Coordination of Care;Education (02/04/17 1000) Education: Understanding Cancer/ Treatment Options;Newly Diagnosed Cancer Education (02/04/17 1000) Interventions: Coordination of Care;Education (02/04/17 1000)   Coordination of Care: Appts;Radiology (02/04/17 1000) Education Method: Written (02/04/17 1000)      Acuity: Level 1 (02/04/17 1000) Acuity Level 1: Initial guidance, education and coordination as needed;Minimal follow up required (02/04/17 1000)    met with patient during initial med-onc consultation with Dr. Rogue Bussing. All questions answered at the time of visit. Pt and family given educational resources and info regarding supportive services. Upcoming appts reviewed with the patient. Contact info given and instructed to call with any further questions or needs. Pt and family verbalized understanding.   Time Spent with Patient: 30 (02/04/17 1000)

## 2017-02-04 NOTE — Progress Notes (Signed)
[facility] NOTE  Patient Care Team: [name], MD as PCP - General (Family Medicine) [name], MD as Consulting Physician (Family Medicine) [name], [name], MD (General Surgery)  CHIEF COMPLAINTS/PURPOSE OF CONSULTATION:  Lung cancer  #  Oncology History   # STAGE I- A. 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  - PARENCHYMAL MARGIN APPEARS CLEAR; TUMOR WAS 1 CM FROM MARGIN BEFORE  REMOVAL OF STAPLE LINE.; NO adjuvant therapy.   # Hx of Laryngeal cancer [? Stage I; s/p Surgery; [name]; s/p RT- [name]]     Primary cancer of right upper lobe of lung ([place])     HISTORY OF PRESENTING ILLNESS:  Curtis Andrade. 71 y.o.  male with a prior history of smoking quit approximately 10 years ago; history of laryngeal cancer in 2010-noted to have a right upper lobe lung nodule on surveillance chest x-rays.  This further led to a PET scan in August 2018-that showed right upper lobe approximately 1 cm nodule; without any significant uptake.  Given his prior history of smoking-the fact that the nodule slightly getting bigger over time-it was decided to proceed with surgery.  Patient had a wedge resection given the location of the tumor.  He is here for a initial consultation with [place].  The patient's postoperative course was complicated by atrial arrhythmia he is on Eliquis [short-term].  Overall he is healing well-except for superficial wound infection [awaiting evaluation with surgery this morning].   Denies any shortness of breath or cough.  Denies any headaches.  Denies any tingling or numbness.  No nausea no vomiting.  ROS: A complete 10 point review of system is done which is negative except mentioned above in history of present illness  MEDICAL HISTORY:  Past Medical History:  Diagnosis Date  . Cancer ([place]) 2010   throat /squamous cell with  radiation  . 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 Colon polyp 2013  . Hemorrhoids    pt denies  . 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 Baruch Gouty and Dr. Nadeen Landau  . Phlebitis 1998  . 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/27/2016    SURGICAL HISTORY: Past Surgical History:  Procedure Laterality Date  . COLONOSCOPY  2013   Dr Vira Agar  . HERNIA REPAIR Right 1990's  . THROAT SURGERY  2010    SOCIAL HISTORY: Social History   Socioeconomic History  . Marital status: Married    Spouse name: Not on file  . Number of children: Not on file  . Years of education: Not on file  . Highest education level: Not on file  Social Needs  . Financial resource strain: Not on file  . Food insecurity - worry: Not on file  . Food insecurity - inability: Not on file  . Transportation needs - medical: Not on file  . Transportation needs - non-medical: Not on file  Occupational History  . Not on file  Tobacco Use  . Smoking status: Former Smoker    Years: 50.00    Last attempt to quit: 03/31/2007    Years since quitting: 9.8  . Smokeless tobacco: Never Used  Substance and Sexual Activity  . Alcohol use: No  . Drug use: No  . Sexual activity: Not on file  Other Topics Concern  . Not on file  Social  History Narrative  . Not on file    FAMILY HISTORY: Family History  Problem Relation Age of Onset  . Alzheimer's disease Mother   . Alzheimer's disease Father   . Stroke Father   . Colon cancer Neg Hx     ALLERGIES:  has No Known Allergies.  MEDICATIONS:  Current Outpatient Medications  Medication Sig Dispense Refill  . amoxicillin-clavulanate (AUGMENTIN) 875-125 MG tablet Take 1 tablet 2 (two) times daily for 10 days by mouth. 20 tablet 0  . apixaban (ELIQUIS) 5 MG TABS tablet Take 1 tablet by mouth 2 (two) times daily.    . Ascorbic Acid (VITAMIN C) 1000 MG tablet Take 1,000 mg by  mouth daily.    . ASHWAGANDHA PO Take 1 tablet by mouth 2 (two) times daily.     . folic acid (FOLVITE) 283 MCG tablet Take 800 mcg by mouth daily.    Marland Kitchen gabapentin (NEURONTIN) 100 MG capsule Take 1 capsule (100 mg total) by mouth 3 (three) times daily. Increase to 2 capsules TID, if ineffective in 24 hours. 120 capsule 0  . Glucosamine-Chondroitin (COSAMIN DS PO) Take 1 tablet by mouth daily.    . Multiple Vitamin (MULTIVITAMIN WITH MINERALS) TABS tablet Take 1 tablet by mouth daily. Senior Multivitamin.    Marland Kitchen omeprazole (PRILOSEC) 40 MG capsule Take 40 mg by mouth daily before supper.     Marland Kitchen aspirin EC 81 MG tablet Take 81 mg by mouth daily.     Marland Kitchen docusate sodium (COLACE) 100 MG capsule Take 2 capsules (200 mg total) by mouth 2 (two) times daily. (Patient not taking: Reported on 02/04/2017) 10 capsule 0  . Omega-3 1000 MG CAPS Take 1,000 mg by mouth daily.     . prochlorperazine (COMPAZINE) 10 MG tablet Take 1 tablet by mouth every 8 (eight) hours as needed.     No current facility-administered medications for this visit.       Marland Kitchen  PHYSICAL EXAMINATION: ECOG PERFORMANCE STATUS: 0 - Asymptomatic  There were no vitals filed for this visit. There were no vitals filed for this visit.  GENERAL: Well-nourished well-developed; Alert, no distress and comfortable.   Accompanied by wife and daughter. EYES: no pallor or icterus OROPHARYNX: no thrush or ulceration; good dentition  NECK: supple, no masses felt LYMPH:  no palpable lymphadenopathy in the cervical, axillary or inguinal regions LUNGS: clear to auscultation and  No wheeze or crackles HEART/CVS: regular rate & rhythm and no murmurs; No lower extremity edema ABDOMEN: abdomen soft, non-tender and normal bowel sounds Musculoskeletal:no cyanosis of digits and no clubbing  PSYCH: alert & oriented x 3 with fluent speech NEURO: no focal motor/sensory deficits SKIN:  no rashes or significant lesions; discharge from one of the incision sites.    LABORATORY DATA:  I have reviewed the data as listed Lab Results  Component Value Date   WBC 10.3 01/17/2017   HGB 15.6 01/17/2017   HCT 45.6 01/17/2017   MCV 95.4 01/17/2017   PLT 178 01/17/2017   Recent Labs    01/11/17 1544 01/14/17 1629 01/16/17 1459 01/17/17 0444  NA 140 138 127* 127*  K 3.4* 3.9 4.0 3.9  CL 102 104 92* 89*  CO2 28 27 26 28   GLUCOSE 94 126* 139* 114*  BUN 15 16 15  23*  CREATININE 1.08 1.15 0.93 1.01  CALCIUM 9.0 8.6* 8.4* 8.7*  GFRNONAA >60 >60 >60 >60  GFRAA >60 >60 >60 >60  PROT 7.4  --   --   --  ALBUMIN 4.3  --   --   --   AST 33  --   --   --   ALT 25  --   --   --   ALKPHOS 49  --   --   --   BILITOT 0.9  --   --   --     RADIOGRAPHIC STUDIES: I have personally reviewed the radiological images as listed and agreed with the findings in the report. Dg Chest 2 View  Result Date: 01/25/2017 CLINICAL DATA:  Lung wedge resection. EXAM: CHEST  2 VIEW COMPARISON:  01/18/2017. FINDINGS: RIGHT chest tube removed. Mild atelectasis and fluid in the RIGHT lung, but no pneumothorax. Normal cardiomediastinal silhouette. No significant effusion or consolidation. IMPRESSION: RIGHT chest tube removed.  No pneumothorax. Electronically Signed   By: Staci Righter M.D.   On: 01/25/2017 11:17   Dg Chest 2 View  Result Date: 01/16/2017 CLINICAL DATA:  Postoperative.  Chest tubes. EXAM: CHEST  2 VIEW COMPARISON:  01/14/2017 FINDINGS: Shallow inspiration with atelectasis in the lung bases. Colonic interposition under the right hemidiaphragm. Two right chest tubes are present without change in position since the previous study. Minimal residual apical pneumothorax. Heart size is normal. Mild pulmonary vascular congestion. No consolidation. Calcification of the aorta. IMPRESSION: Two right chest tubes are unchanged in position. Minimal residual apical pneumothorax. Suggestion of mild pulmonary vascular congestion. No focal consolidation. Shallow inspiration with  atelectasis in the lung bases. Electronically Signed   By: Lucienne Capers M.D.   On: 01/16/2017 00:28   Dg Chest 2 View  Result Date: 01/12/2017 CLINICAL DATA:  71 year old male preoperative study for right lung surgery. EXAM: CHEST  2 VIEW COMPARISON:  Chest CT 01/04/2017.  PET-CT 11/24/2016, and earlier. FINDINGS: Stable lung volumes. Mediastinal contours remain normal. The medial anterior 10 mm lung nodule is poorly visible radiographically. Stable lung parenchyma with mild coarse increased interstitial markings bilaterally. No pulmonary edema or pleural effusion. No new pulmonary opacity. No acute osseous abnormality identified. Negative visible bowel gas pattern. IMPRESSION: 1. Poor radiographic visualization of the medial and anterior right upper lobe lung nodule. 2.  No acute cardiopulmonary abnormality. Electronically Signed   By: Genevie Ann M.D.   On: 01/12/2017 07:51   Dg Chest Port 1 View  Result Date: 01/18/2017 CLINICAL DATA:  Status post thoracotomy EXAM: PORTABLE CHEST 1 VIEW COMPARISON:  01/17/2017 FINDINGS: Cardiac shadow is stable. Aortic calcifications are again seen. Right thoracostomy catheter is again noted. No pneumothorax is seen. Elevation the right hemidiaphragm with mild right basilar atelectasis is noted. IMPRESSION: No evidence of right pneumothorax. Persistent right basilar atelectasis is noted. Electronically Signed   By: Inez Catalina M.D.   On: 01/18/2017 07:41   Dg Chest Port 1 View  Result Date: 01/17/2017 CLINICAL DATA:  Right upper lobe wedge resection for lung cancer. EXAM: PORTABLE CHEST 1 VIEW COMPARISON:  01/15/2017 and prior exams FINDINGS: The lower right thoracostomy tube has been removed and the upper right thoracostomy tube is unchanged. There is no evidence of pneumothorax. Right hemithorax volume loss/surgical change again noted with un chain mild right basilar atelectasis. The left lung is clear. There is no evidence of pulmonary edema IMPRESSION: Removal  of 1 of the right thoracostomy tubes without other significant change. No evidence of pneumothorax. Electronically Signed   By: Margarette Canada M.D.   On: 01/17/2017 08:30   Dg Chest Port 1 View  Result Date: 01/14/2017 CLINICAL DATA:  Status post right-sided thoracoscopy  with wide red wedge resection preoperative examination prior to bronchoscopy. EXAM: PORTABLE CHEST 1 VIEW COMPARISON:  Chest x-ray of January 11, 2017 FINDINGS: There remains an approximately 5% or less right-sided pneumothorax. There are 2 right-sided chest tubes present. The tip of the upper tube lies in the apex overlying the posteromedial aspect of the third rib. The lower tube projects in the medial cardio phrenic angle. There is patchy density at the left lung base. The heart is top-normal in size. The pulmonary vascularity is mildly engorged. There is calcification in the wall of the aortic arch. The bony thorax exhibits no acute abnormality. IMPRESSION: 5% or less right apical pneumothorax. Two chest tubes are present. Patchy density lateral to the cardiac apex likely reflects subsegmental atelectasis. Top-normal cardiac size with mild central pulmonary vascular prominence. Thoracic aortic atherosclerosis. Electronically Signed   By: David  Martinique M.D.   On: 01/14/2017 14:46    ASSESSMENT & PLAN:   Primary cancer of right upper lobe of lung ([place]) #Stage I adenocarcinoma of the lung; status post wedge resection/however clear margins.  Low-grade/with lipidic component.ibuprofen I discussed given the overall good prognosis; I would not recommend any further adjuvant chemotherapy or radiation therapy.   #History of laryngeal cancer-2010 stage I [as per patient]; cured.  #History of smoking-quit approximately 10 years ago.    #Atrial arrhythmia-postoperatively.  Short course of Eliquis.  Currently in sinus rhythm.  #For surveillance of lung cancer I would recommend a follow-up noncontrast CT scan every 12 months or so for a total  of 5 years.  And then recommend surveillance chest x-ray  #Patient follow-up with me in approximately 12 months/noncontrast CT chest prior.   Thank you Dr. Genevive Bi for allowing me to participate in the care of your pleasant patient. Please do not hesitate to contact me with questions or concerns in the interim.  Cc: Dr.Hedrrick.    All questions were answered. The patient knows to call the clinic with any problems, questions or concerns.    Cammie Sickle, MD 02/04/2017 10:23 AM

## 2017-02-04 NOTE — Progress Notes (Signed)
S/p right VATS and wedge He continue to have pain on post incision and now some pus coming out  PE NAD Incision with erythema, wound opened and 8cc pus drained. Plain packing placed. No apparent  evidence of communication w thoracic cavity  A/P Wound infection Add bactrim RTC next week w Dr. Genevive Bi We will order repeat labs and CXR

## 2017-02-04 NOTE — Assessment & Plan Note (Addendum)
#  Stage I adenocarcinoma of the lung; status post wedge resection/however clear margins.  Low-grade/with lipidic component.ibuprofen I discussed given the overall good prognosis; I would not recommend any further adjuvant chemotherapy or radiation therapy.   #History of laryngeal cancer-2010 stage I [as per patient]; cured.  #History of smoking-quit approximately 10 years ago.    #Atrial arrhythmia-postoperatively.  Short course of Eliquis.  Currently in sinus rhythm.  #For surveillance of lung cancer I would recommend a follow-up noncontrast CT scan every 12 months or so for a total of 5 years.  And then recommend surveillance chest x-ray  #Patient follow-up with me in approximately 12 months/noncontrast CT chest prior.   Thank you Dr. Genevive Bi for allowing me to participate in the care of your pleasant patient. Please do not hesitate to contact me with questions or concerns in the interim.  Cc: Dr.Hedrrick.

## 2017-02-08 ENCOUNTER — Ambulatory Visit
Admission: RE | Admit: 2017-02-08 | Discharge: 2017-02-08 | Disposition: A | Discharge status: Home / Self Care | Payer: PPO | Source: Ambulatory Visit | Attending: Surgery | Admitting: Surgery

## 2017-02-08 ENCOUNTER — Ambulatory Visit: Payer: PPO | Admitting: Cardiothoracic Surgery

## 2017-02-08 DIAGNOSIS — C3491 Malignant neoplasm of unspecified part of right bronchus or lung: Secondary | ICD-10-CM | POA: Diagnosis not present

## 2017-02-08 DIAGNOSIS — R918 Other nonspecific abnormal finding of lung field: Secondary | ICD-10-CM | POA: Diagnosis not present

## 2017-02-08 IMAGING — CR DG CHEST 2V
1 series · 2 of 2 positions shown · non-contrast
Comparison: [DATE]

CLINICAL DATA: History of lung carcinoma on the right with recent
wedge resection

EXAM:
CHEST  2 VIEW

[Series 1: w chest pa · 0.14mm/px · 2 of 2 slices shown]
[im 1/2]
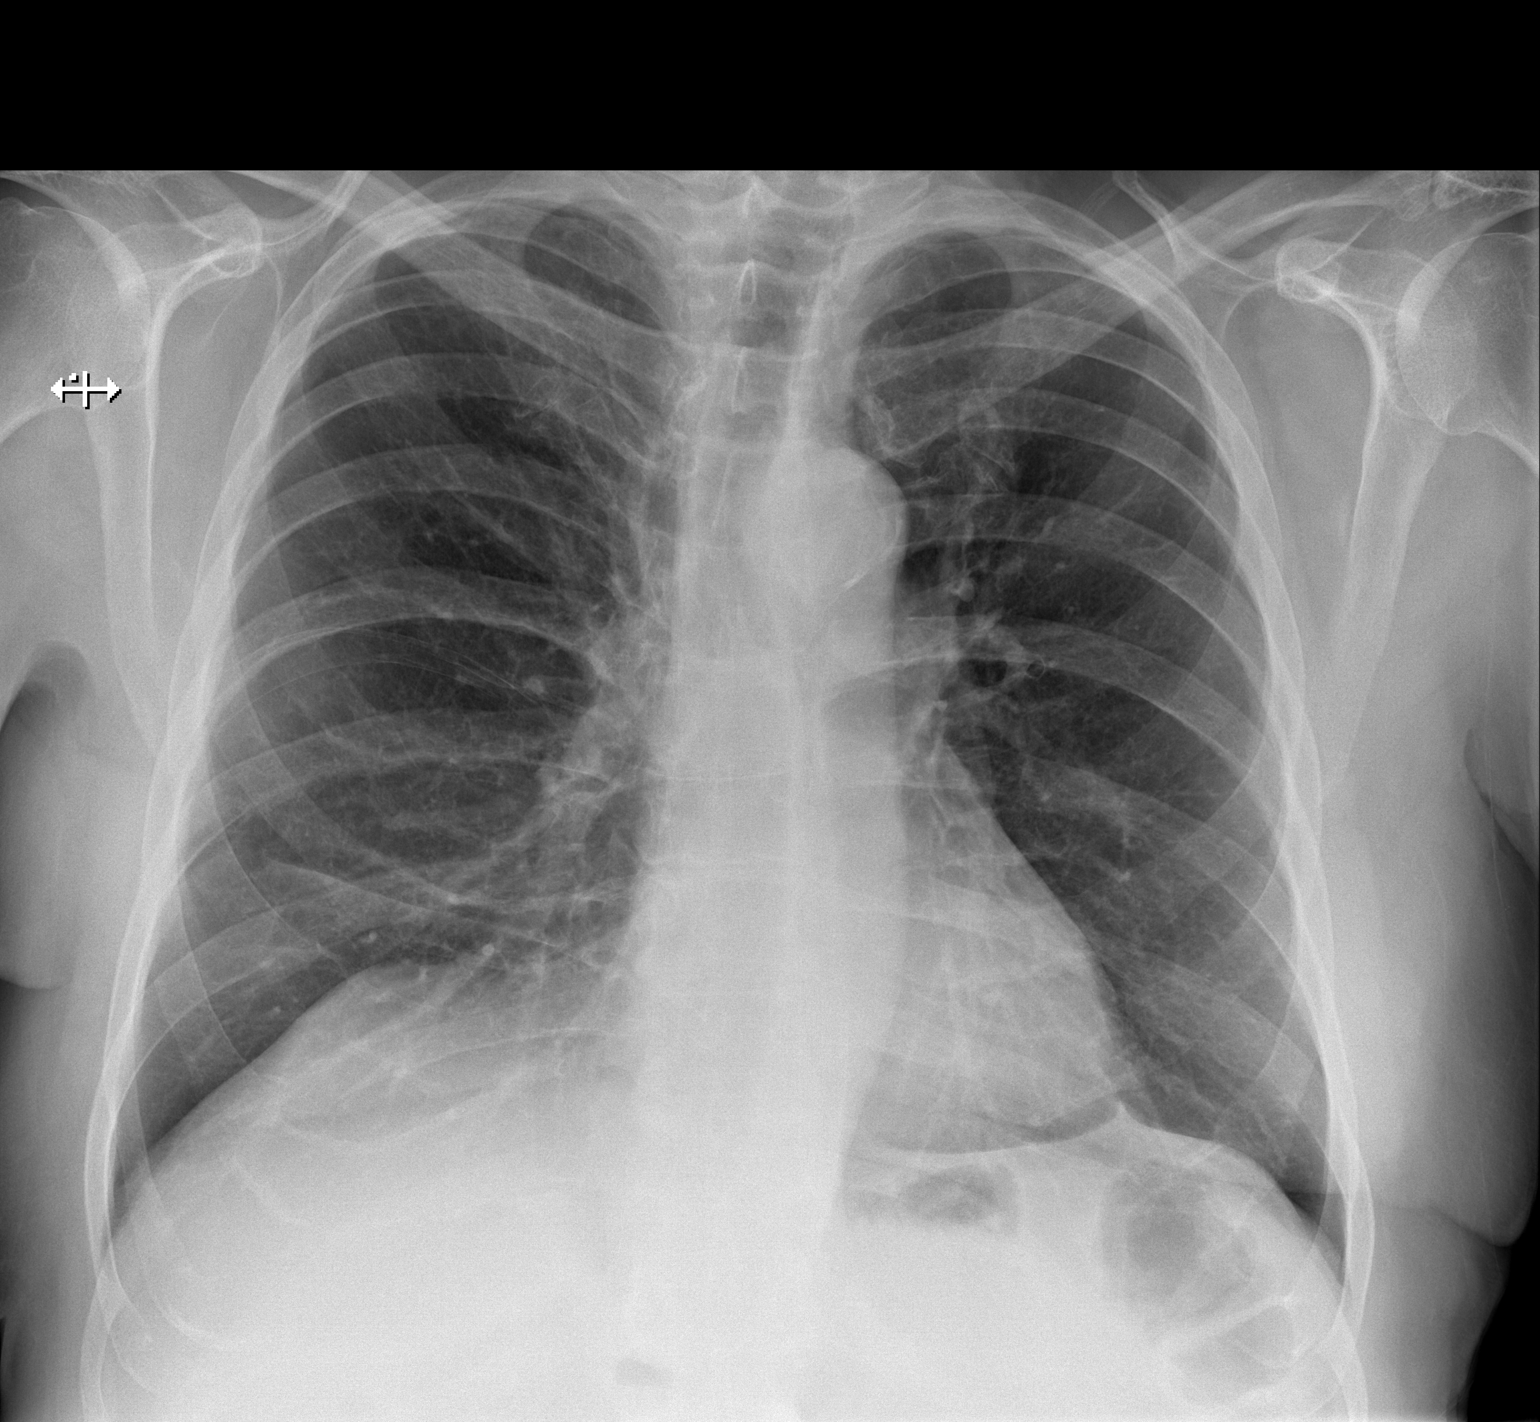
[im 2/2]
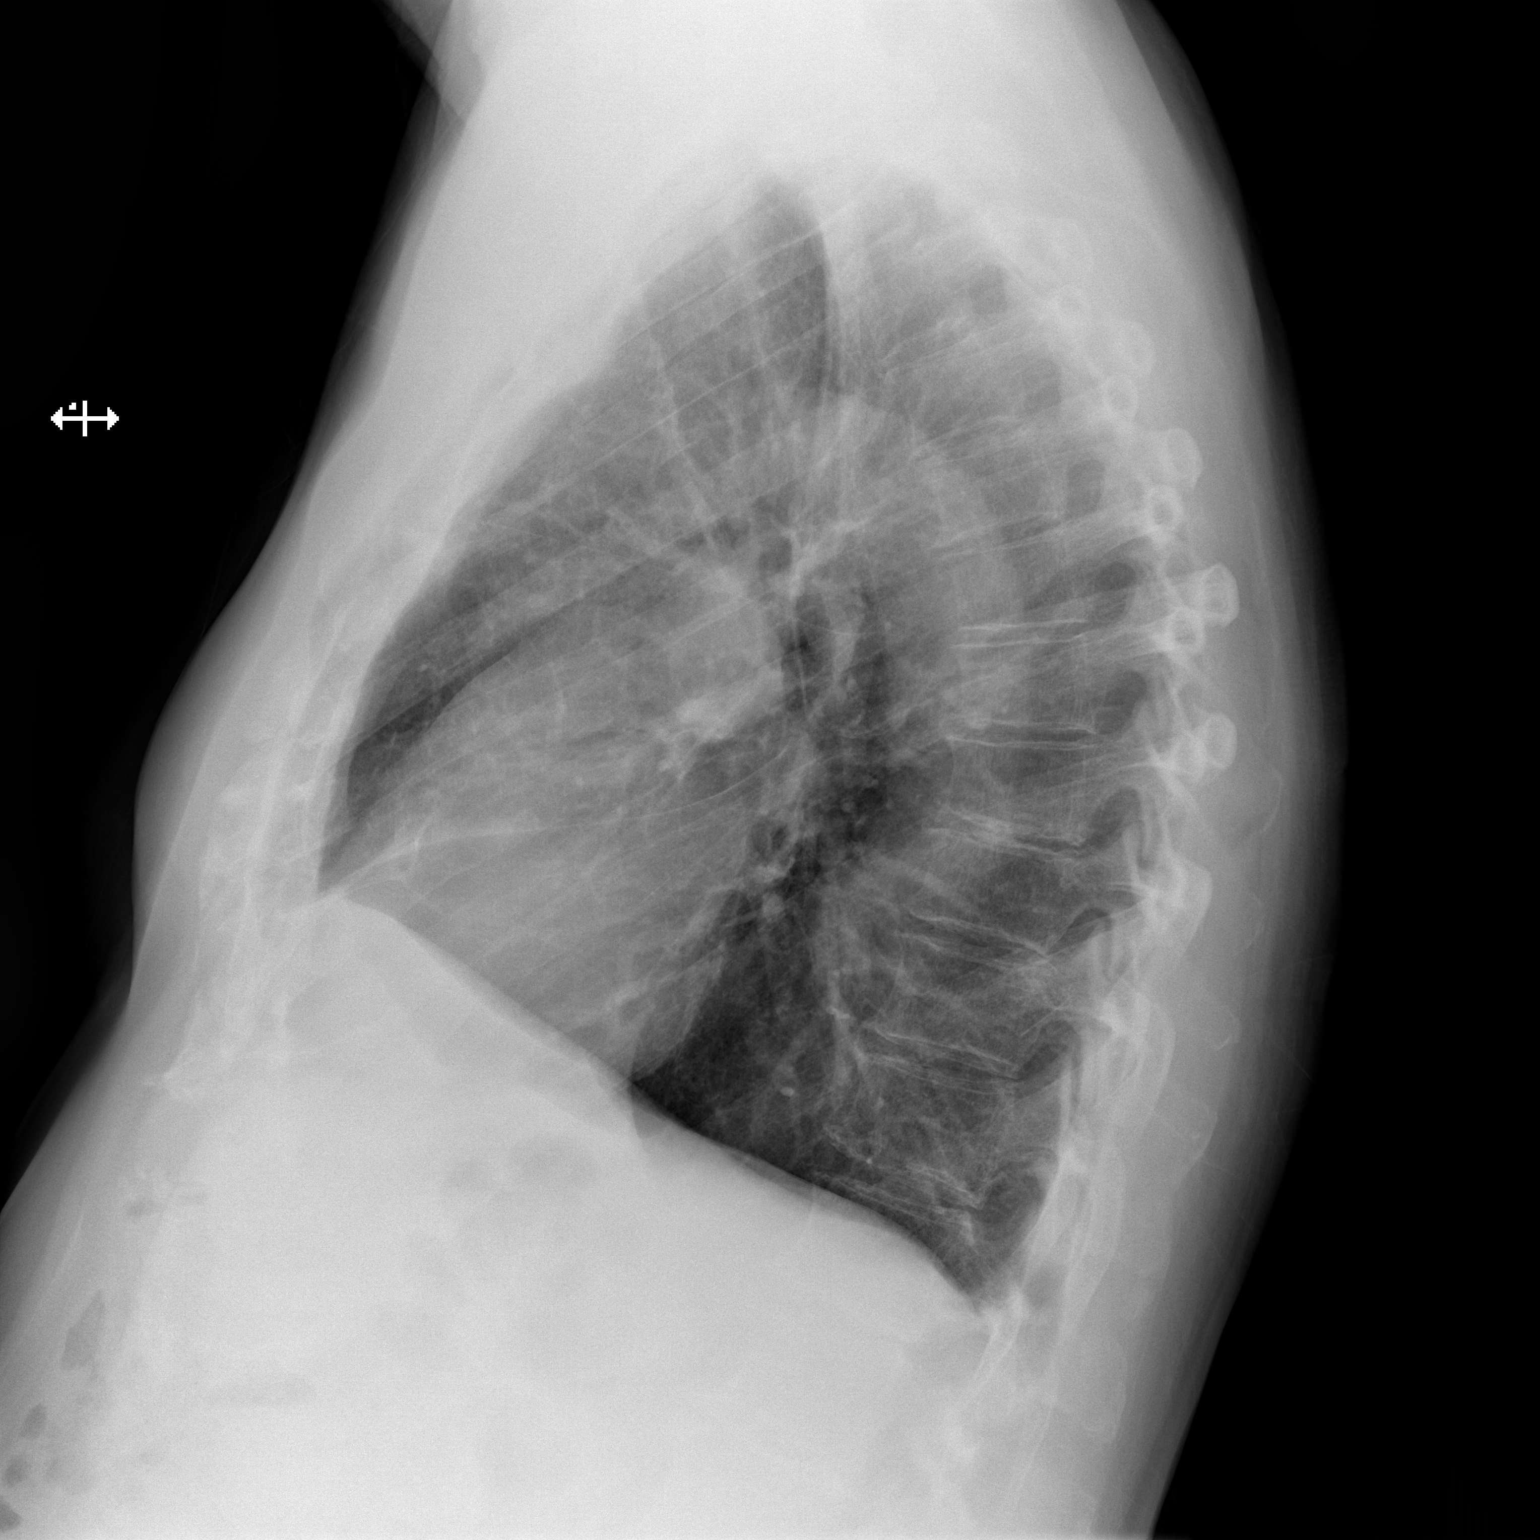

[2 of 2 positions shown; findings below may reference images not displayed]

FINDINGS: Cardiac shadow is stable. Aortic calcifications are again seen. The
lungs are well aerated bilaterally without evidence of pneumothorax.
Postsurgical changes are seen all on the right consistent with the
history. No acute bony abnormality is noted.
IMPRESSION: No active cardiopulmonary disease.

## 2017-02-12 ENCOUNTER — Ambulatory Visit (INDEPENDENT_AMBULATORY_CARE_PROVIDER_SITE_OTHER): Payer: PPO | Admitting: Cardiothoracic Surgery

## 2017-02-12 ENCOUNTER — Encounter: Payer: Self-pay | Admitting: Cardiothoracic Surgery

## 2017-02-12 ENCOUNTER — Ambulatory Visit: Payer: Self-pay | Admitting: Cardiothoracic Surgery

## 2017-02-12 VITALS — BP 158/89 | HR 79 | Temp 97.9°F | Wt 209.0 lb

## 2017-02-12 DIAGNOSIS — C3491 Malignant neoplasm of unspecified part of right bronchus or lung: Secondary | ICD-10-CM

## 2017-02-12 NOTE — Patient Instructions (Signed)
Please continue to change your dressing as you have been doing. Your wife is doing a great job! :)  Please give [place] a call in case you have any questions or concerns.  Remember that there is no need of a Chest X-Ray next time you come in.

## 2017-02-12 NOTE — Progress Notes (Signed)
  Patient ID: Curtis Andrade., male   DOB: February 21, 1946, 71 y.o.   MRN: [id number]  HISTORY: He comes in today in follow-up.  His posterior thoracoscopy wound is being packed twice daily with iodoform gauze.  It looks very clean with no drainage.  There is no discomfort associated with that area.  He did see Dr. Lynett Fish for follow-up of his lung cancer and no additional therapy has been recommended to him.  He is scheduled to follow-up with Dr. Lynett Fish in 1 year with another CT scan.  He does not complain of any shortness of breath.  He states that he feels quite well and is actually working out at [employer].   Vitals:   02/12/17 0822  BP: (!) 158/89  Pulse: 79  Temp: 97.9 F (36.6 C)  SpO2: 98%     EXAM:    Resp: Lungs are clear bilaterally.  No respiratory distress, normal effort. Heart:  Regular without murmurs Abd:  Abdomen is soft, non distended and non tender. No masses are palpable.  There is no rebound and no guarding.  Neurological: Alert and oriented to person, place, and time. Coordination normal.  Skin: Skin is warm and dry. No rash noted. No diaphoretic. No erythema. No pallor. I redressed the wound.  It is clean.  There is an excellent granulating base. Psychiatric: Normal mood and affect. Normal behavior. Judgment and thought content normal.    ASSESSMENT: Wound infection status post thoracoscopy   PLAN:   I would like to see the patient back again in 2 weeks.  We again counseled him on how to manage his wound.  He will call if there are any problems.    [name], MD

## 2017-02-16 ENCOUNTER — Other Ambulatory Visit: Payer: Self-pay | Admitting: Cardiothoracic Surgery

## 2017-02-22 DIAGNOSIS — I480 Paroxysmal atrial fibrillation: Secondary | ICD-10-CM | POA: Diagnosis not present

## 2017-02-26 ENCOUNTER — Ambulatory Visit (INDEPENDENT_AMBULATORY_CARE_PROVIDER_SITE_OTHER): Payer: PPO | Admitting: Cardiothoracic Surgery

## 2017-02-26 ENCOUNTER — Encounter: Payer: Self-pay | Admitting: Cardiothoracic Surgery

## 2017-02-26 ENCOUNTER — Telehealth: Payer: Self-pay

## 2017-02-26 VITALS — BP 155/92 | HR 75 | Temp 97.7°F | Resp 18 | Ht 71.0 in | Wt 211.6 lb

## 2017-02-26 DIAGNOSIS — T8149XA Infection following a procedure, other surgical site, initial encounter: Secondary | ICD-10-CM

## 2017-02-26 DIAGNOSIS — C3411 Malignant neoplasm of upper lobe, right bronchus or lung: Secondary | ICD-10-CM

## 2017-02-26 NOTE — Progress Notes (Signed)
He returns today in follow-up.  He did see Dr. Lynett Fish and he is recommended to get a CT scan for further follow-up.  Regarding his wound he states that his wife has been changing the dressing twice daily.  There is been no purulent drainage or erythema from it.  Overall looks very clean.  I did change his dressings today.  The wound base is clean and granulating.  There are no undrained areas.  I did request that he decrease the dressing frequency to once per day.  We will see him back again in 1 month.  We will also check a CT scan of his chest in June for follow-up of his lung cancer.

## 2017-02-26 NOTE — Telephone Encounter (Signed)
Patient notified of below appointments. Appointment reminders mailed.  Dr.Oaks 03/17/17 @ 8 am. Dr.Oaks 08/26/16 @ 9:30 CT Chest w/o contrast 8 am.

## 2017-02-26 NOTE — Patient Instructions (Signed)
We would like for you to continue dressing changes once daily. We will see you in 1 month to evaluate the wound. We will follow up in August 28, 2017 with a CT  scan of the chest w/o contrast.  I will call you with the appointment date later today.

## 2017-03-15 ENCOUNTER — Ambulatory Visit (INDEPENDENT_AMBULATORY_CARE_PROVIDER_SITE_OTHER): Payer: PPO | Admitting: Cardiothoracic Surgery

## 2017-03-15 ENCOUNTER — Encounter: Payer: Self-pay | Admitting: Cardiothoracic Surgery

## 2017-03-15 VITALS — BP 162/92 | HR 80 | Temp 98.3°F | Wt 213.0 lb

## 2017-03-15 DIAGNOSIS — C3491 Malignant neoplasm of unspecified part of right bronchus or lung: Secondary | ICD-10-CM

## 2017-03-15 MED ORDER — GABAPENTIN 100 MG PO CAPS: 100.0000 mg | ORAL_CAPSULE | Freq: Three times a day (TID) | ORAL | 0 refills | Status: DC

## 2017-03-15 NOTE — Patient Instructions (Signed)
Please give us a call in case you have any questions or concerns.  

## 2017-03-15 NOTE — Progress Notes (Signed)
He returns today in follow-up.  His thoracoscopy wound infection is now completely healed.  He has no other problems.  His other thoracoscopy wounds are also healed.  He states that he is been cleansing the wounds daily with soap and water but has not packed them since they have been healed now for a week or so.  He is scheduled to come back to see me again in 4 months with a repeat CT scan of the chest.

## 2017-03-16 DIAGNOSIS — L570 Actinic keratosis: Secondary | ICD-10-CM | POA: Diagnosis not present

## 2017-03-16 DIAGNOSIS — D2261 Melanocytic nevi of right upper limb, including shoulder: Secondary | ICD-10-CM | POA: Diagnosis not present

## 2017-03-16 DIAGNOSIS — D2272 Melanocytic nevi of left lower limb, including hip: Secondary | ICD-10-CM | POA: Diagnosis not present

## 2017-03-16 DIAGNOSIS — C44719 Basal cell carcinoma of skin of left lower limb, including hip: Secondary | ICD-10-CM | POA: Diagnosis not present

## 2017-03-16 DIAGNOSIS — D225 Melanocytic nevi of trunk: Secondary | ICD-10-CM | POA: Diagnosis not present

## 2017-03-16 DIAGNOSIS — Z85828 Personal history of other malignant neoplasm of skin: Secondary | ICD-10-CM | POA: Diagnosis not present

## 2017-03-16 DIAGNOSIS — X32XXXA Exposure to sunlight, initial encounter: Secondary | ICD-10-CM | POA: Diagnosis not present

## 2017-03-16 DIAGNOSIS — C44311 Basal cell carcinoma of skin of nose: Secondary | ICD-10-CM | POA: Diagnosis not present

## 2017-03-16 DIAGNOSIS — D485 Neoplasm of uncertain behavior of skin: Secondary | ICD-10-CM | POA: Diagnosis not present

## 2017-03-17 ENCOUNTER — Encounter: Payer: PPO | Admitting: Cardiothoracic Surgery

## 2017-04-20 DIAGNOSIS — C44311 Basal cell carcinoma of skin of nose: Secondary | ICD-10-CM | POA: Diagnosis not present

## 2017-04-20 DIAGNOSIS — Z85828 Personal history of other malignant neoplasm of skin: Secondary | ICD-10-CM | POA: Diagnosis not present

## 2017-05-04 DIAGNOSIS — C44719 Basal cell carcinoma of skin of left lower limb, including hip: Secondary | ICD-10-CM | POA: Diagnosis not present

## 2017-08-02 DIAGNOSIS — X32XXXA Exposure to sunlight, initial encounter: Secondary | ICD-10-CM | POA: Diagnosis not present

## 2017-08-02 DIAGNOSIS — D2262 Melanocytic nevi of left upper limb, including shoulder: Secondary | ICD-10-CM | POA: Diagnosis not present

## 2017-08-02 DIAGNOSIS — D2261 Melanocytic nevi of right upper limb, including shoulder: Secondary | ICD-10-CM | POA: Diagnosis not present

## 2017-08-02 DIAGNOSIS — L570 Actinic keratosis: Secondary | ICD-10-CM | POA: Diagnosis not present

## 2017-08-02 DIAGNOSIS — C44612 Basal cell carcinoma of skin of right upper limb, including shoulder: Secondary | ICD-10-CM | POA: Diagnosis not present

## 2017-08-02 DIAGNOSIS — Z08 Encounter for follow-up examination after completed treatment for malignant neoplasm: Secondary | ICD-10-CM | POA: Diagnosis not present

## 2017-08-02 DIAGNOSIS — D225 Melanocytic nevi of trunk: Secondary | ICD-10-CM | POA: Diagnosis not present

## 2017-08-02 DIAGNOSIS — D485 Neoplasm of uncertain behavior of skin: Secondary | ICD-10-CM | POA: Diagnosis not present

## 2017-08-02 DIAGNOSIS — D2271 Melanocytic nevi of right lower limb, including hip: Secondary | ICD-10-CM | POA: Diagnosis not present

## 2017-08-02 DIAGNOSIS — D2272 Melanocytic nevi of left lower limb, including hip: Secondary | ICD-10-CM | POA: Diagnosis not present

## 2017-08-02 DIAGNOSIS — Z85828 Personal history of other malignant neoplasm of skin: Secondary | ICD-10-CM | POA: Diagnosis not present

## 2017-08-26 ENCOUNTER — Ambulatory Visit
Admission: RE | Admit: 2017-08-26 | Discharge: 2017-08-26 | Disposition: A | Discharge status: Home / Self Care | Payer: PPO | Source: Ambulatory Visit | Attending: Cardiothoracic Surgery | Admitting: Cardiothoracic Surgery

## 2017-08-26 ENCOUNTER — Other Ambulatory Visit: Payer: Self-pay

## 2017-08-26 ENCOUNTER — Ambulatory Visit: Payer: PPO | Admitting: Cardiothoracic Surgery

## 2017-08-26 DIAGNOSIS — J439 Emphysema, unspecified: Secondary | ICD-10-CM | POA: Insufficient documentation

## 2017-08-26 DIAGNOSIS — Z902 Acquired absence of lung [part of]: Secondary | ICD-10-CM | POA: Diagnosis not present

## 2017-08-26 DIAGNOSIS — C3411 Malignant neoplasm of upper lobe, right bronchus or lung: Secondary | ICD-10-CM | POA: Insufficient documentation

## 2017-08-26 DIAGNOSIS — I700 Atherosclerosis of aorta: Secondary | ICD-10-CM | POA: Diagnosis not present

## 2017-08-26 DIAGNOSIS — R918 Other nonspecific abnormal finding of lung field: Secondary | ICD-10-CM | POA: Insufficient documentation

## 2017-08-26 IMAGING — CT CT CHEST W/O CM
2 of 3 series · 15 of 36 positions shown, 18 images · non-contrast
Comparison: PET-CT [DATE] and chest CT [DATE]

CLINICAL DATA: Restaging adenocarcinoma of the right upper lobe.
Status post right upper lobe wedge resection in [DATE].

EXAM:
CT CHEST WITHOUT CONTRAST
TECHNIQUE: Multidetector CT imaging of the chest was performed following the
standard protocol without IV contrast.

[Series 2: thorax · axial · 0.72mm/px · z∈[-388,-66]mm · 12 of 189 slices shown, 15 images]
[im 14/189  mediastinal]
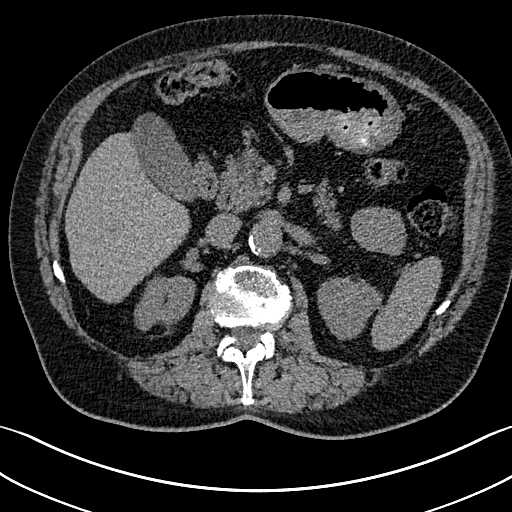
[im 14/189  lung]
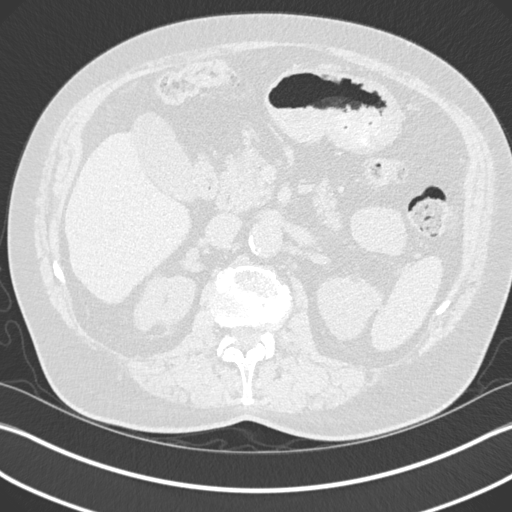
[im 28/189  lung]
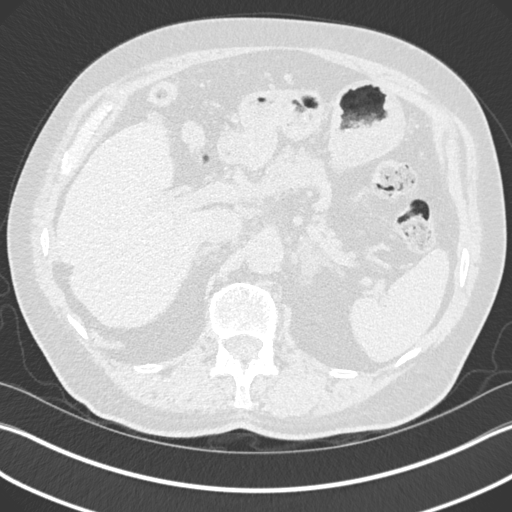
[im 42/189  lung]
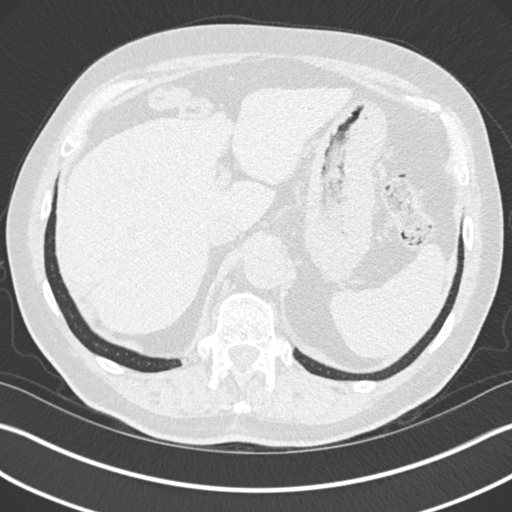
[im 56/189  lung]
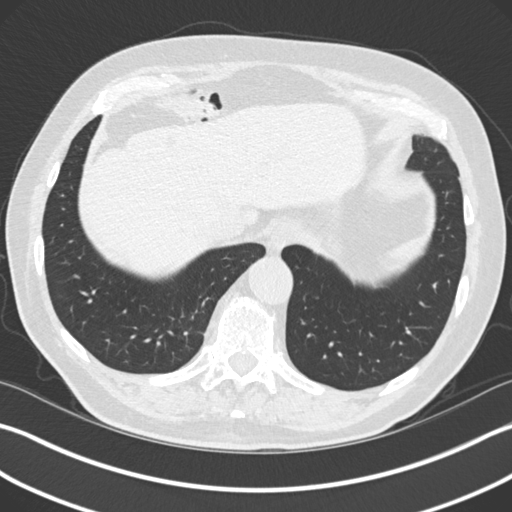
[im 70/189  mediastinal]
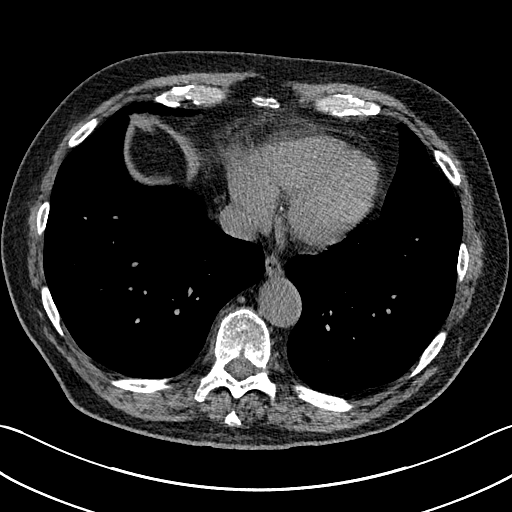
[im 70/189  lung]
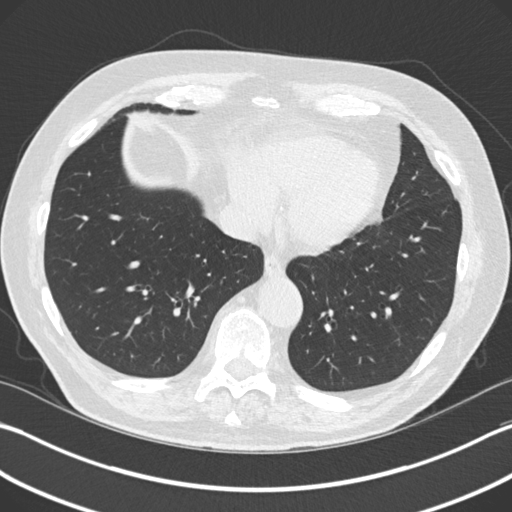
[im 84/189  lung]
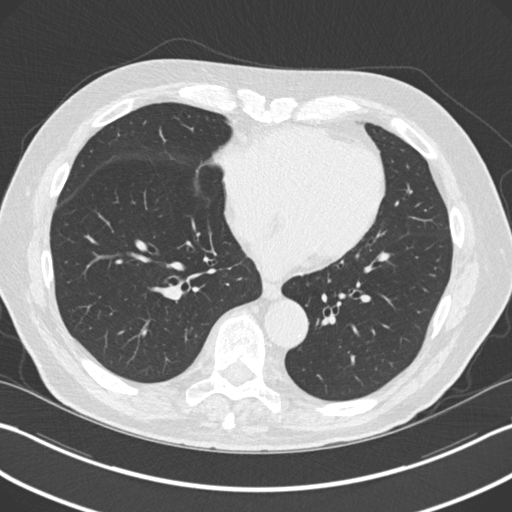
[im 105/189  lung]
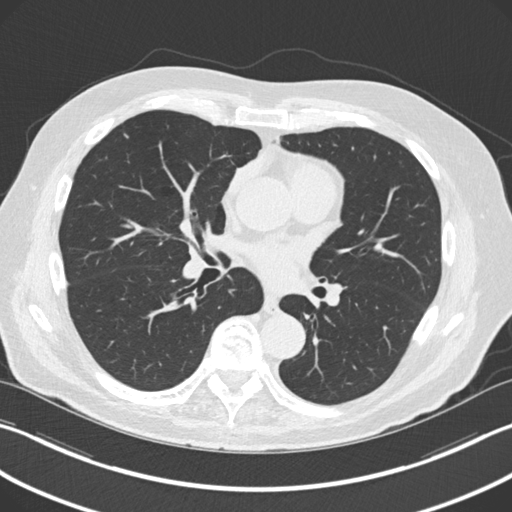
[im 119/189  lung]
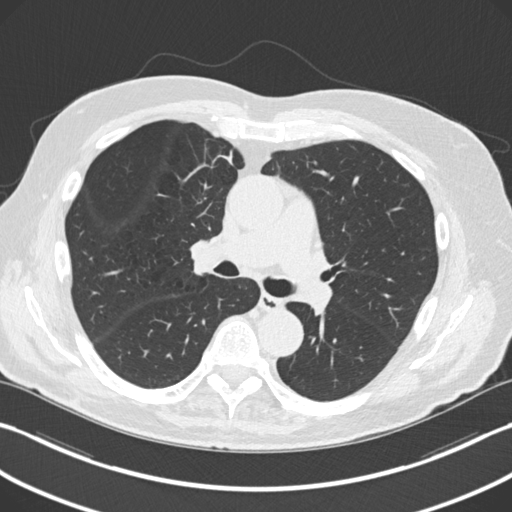
[im 133/189  mediastinal]
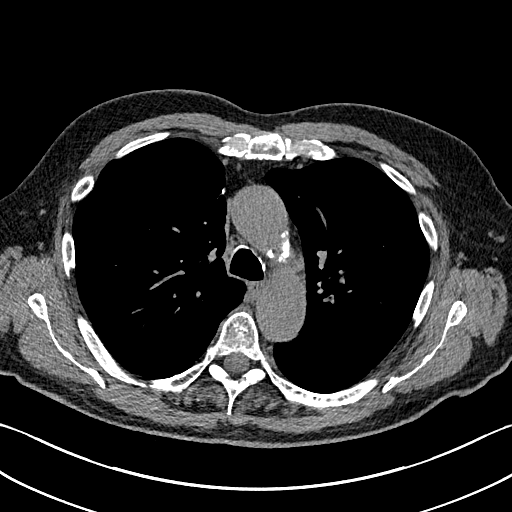
[im 133/189  lung]
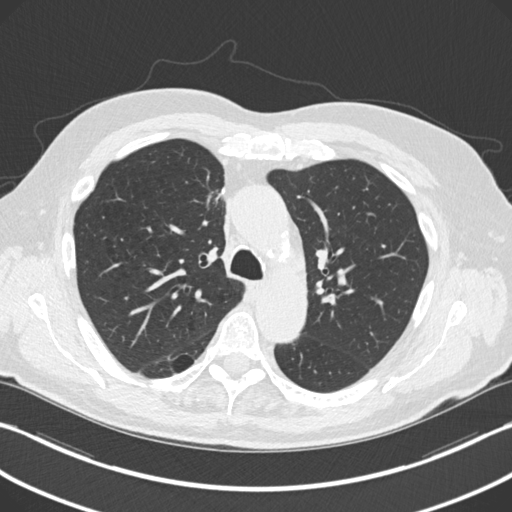
[im 147/189  lung]
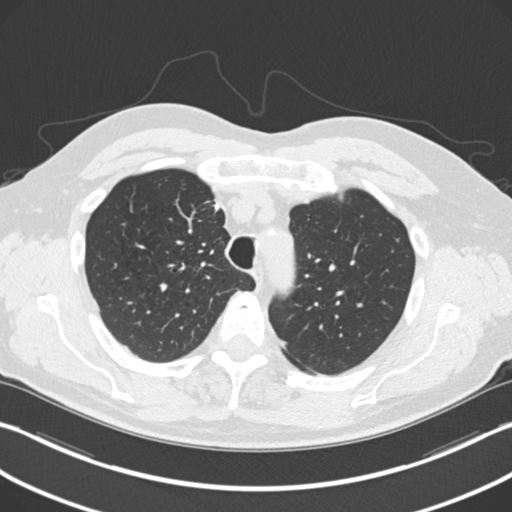
[im 161/189  lung]
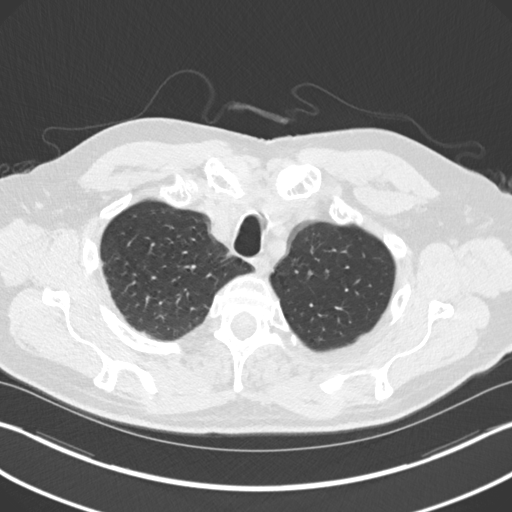
[im 175/189  lung]
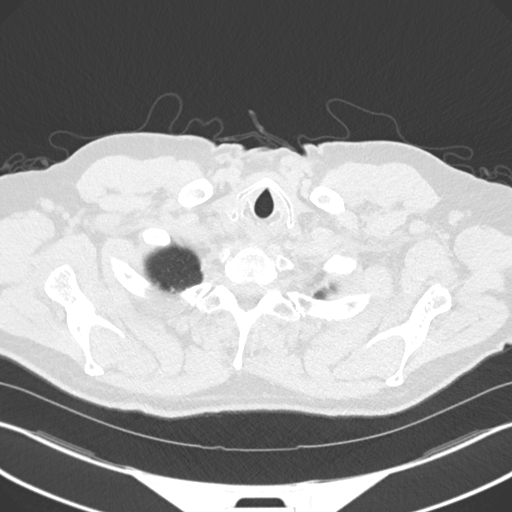

[Series 5: coronal · coronal · 0.77mm/px · 3 of 150 slices shown]
[im 30/150  lung]
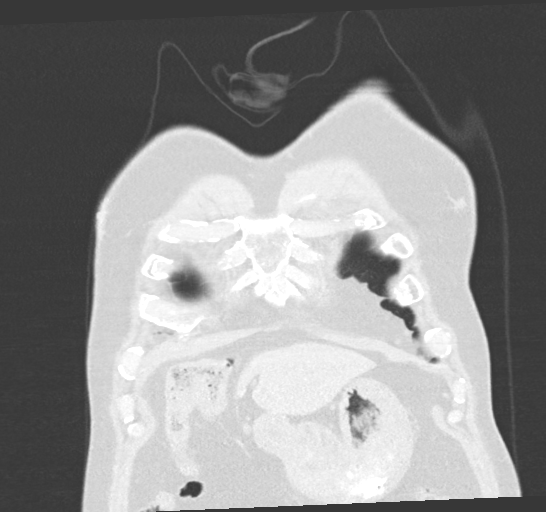
[im 60/150  lung]
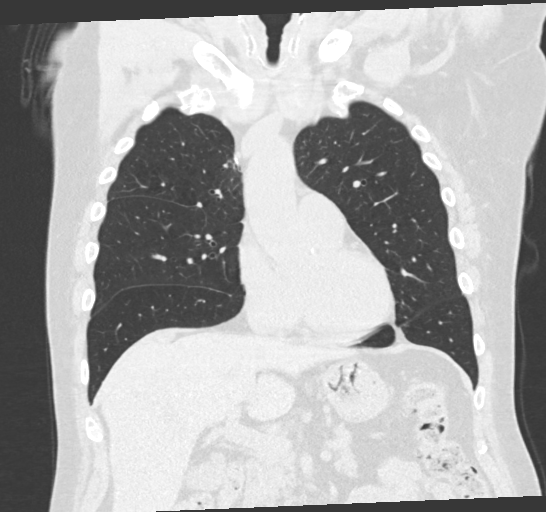
[im 90/150  lung]
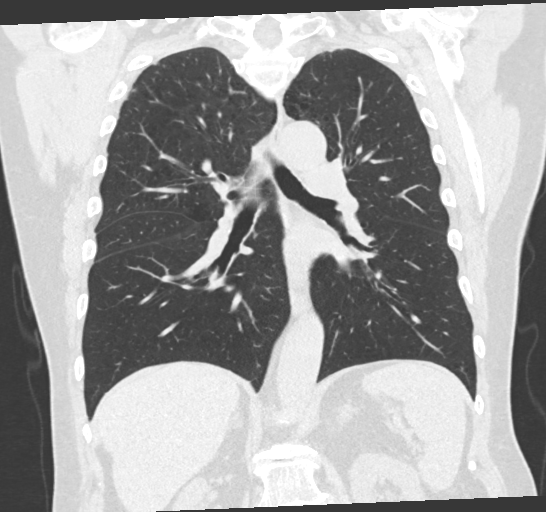

[15 of 36 positions shown; findings below may reference images not displayed]

FINDINGS: Cardiovascular: The heart is normal in size. No pericardial
effusion. Stable tortuosity, mild ectasia and scattered
atherosclerotic calcifications involving the thoracic aorta. Stable
coronary artery calcifications.

Mediastinum/Nodes: No mediastinal or hilar mass or lymphadenopathy.
Small scattered lymph nodes are stable. The esophagus is grossly
normal.

Lungs/Pleura: Surgical changes from a prior right upper lobe wedge
resection. No findings for residual or recurrent tumor.

Stable small right middle lobe nodules. 3.5 mm nodule on image
number 86 and 4 mm nodule on image number 90 are unchanged.

Upper Abdomen: No significant upper abdominal findings. Stable left
adrenal gland adenoma. Stable hepatic cyst. No findings suspicious
for metastatic disease.

Cholelithiasis.

Musculoskeletal: No chest wall mass, supraclavicular or axillary
adenopathy.

Moderate degenerative changes involving the thoracic spine. No
worrisome bone lesions.
IMPRESSION: 1. Expected surgical changes from a prior right upper lobe wedge
resection. No findings suspicious for residual or recurrent tumor
and no mediastinal/hilar adenopathy or evidence of metastatic
disease.
2. 2 small stable right middle lobe pulmonary nodules. Recommend
follow-up noncontrast chest CT in 6-12 months.

Aortic Atherosclerosis ([E9]-[E9]) and Emphysema ([E9]-[E9]).

## 2017-08-27 ENCOUNTER — Encounter: Payer: Self-pay | Admitting: Cardiothoracic Surgery

## 2017-08-27 ENCOUNTER — Ambulatory Visit (INDEPENDENT_AMBULATORY_CARE_PROVIDER_SITE_OTHER): Payer: PPO | Admitting: Cardiothoracic Surgery

## 2017-08-27 VITALS — BP 166/84 | HR 68 | Temp 97.7°F | Resp 18 | Ht 71.0 in | Wt 211.8 lb

## 2017-08-27 DIAGNOSIS — C3491 Malignant neoplasm of unspecified part of right bronchus or lung: Secondary | ICD-10-CM

## 2017-08-27 NOTE — Patient Instructions (Signed)
Please continue to follow up with Dr.Brahmanday.   If you have questions or concerns please call our office.

## 2017-08-27 NOTE — Progress Notes (Signed)
  Patient ID: Curtis Andrade., male   DOB: 1945/09/04, 72 y.o.   MRN: [id number]  HISTORY: He is now about 6 months out from his right thoracoscopy and wedge resection of a right upper lobe nodule.  The nodule turned out to be an adenocarcinoma and the margins were negative.  He is just undergoing surveillance.  He has no specific complaints today.  Overall he states that his breathing feels quite good.  He did have a chest CT made yesterday.   Vitals:   08/27/17 0836  BP: (!) 166/84  Pulse: 68  Resp: 18  Temp: 97.7 F (36.5 C)  SpO2: 99%     EXAM:    Resp: Lungs are clear bilaterally.  No respiratory distress, normal effort. Heart:  Regular without murmurs Abd:  Abdomen is soft, non distended and non tender. No masses are palpable.  There is no rebound and no guarding.  Neurological: Alert and oriented to person, place, and time. Coordination normal.  Skin: Skin is warm and dry. No rash noted. No diaphoretic. No erythema. No pallor.  Psychiatric: Normal mood and affect. Normal behavior. Judgment and thought content normal.    ASSESSMENT: I have independently reviewed the patient's CT scan.  There is no evidence of recurrence along the staple line in the right upper lobe.  There is no mediastinal adenopathy.  There are scattered pulmonary nodules which are unchanged.   PLAN:   The patient states that he has an appointment with Dr. Lynett Fish in 6 months with another CT scan.  That would be my current recommendation as well.  Since he is following up with Dr. Lynett Fish in the oncology clinic I will not make an appointment for him to come back and see [place].  If there is any problems would be happy to see him in the future.    [name], MD

## 2017-09-17 DIAGNOSIS — C44612 Basal cell carcinoma of skin of right upper limb, including shoulder: Secondary | ICD-10-CM | POA: Diagnosis not present

## 2017-11-01 DIAGNOSIS — Z125 Encounter for screening for malignant neoplasm of prostate: Secondary | ICD-10-CM | POA: Diagnosis not present

## 2017-11-01 DIAGNOSIS — Z0000 Encounter for general adult medical examination without abnormal findings: Secondary | ICD-10-CM | POA: Diagnosis not present

## 2017-11-08 DIAGNOSIS — R030 Elevated blood-pressure reading, without diagnosis of hypertension: Secondary | ICD-10-CM | POA: Diagnosis not present

## 2017-11-08 DIAGNOSIS — K219 Gastro-esophageal reflux disease without esophagitis: Secondary | ICD-10-CM | POA: Diagnosis not present

## 2017-11-08 DIAGNOSIS — Z0000 Encounter for general adult medical examination without abnormal findings: Secondary | ICD-10-CM | POA: Diagnosis not present

## 2018-01-24 DIAGNOSIS — D2272 Melanocytic nevi of left lower limb, including hip: Secondary | ICD-10-CM | POA: Diagnosis not present

## 2018-01-24 DIAGNOSIS — D2262 Melanocytic nevi of left upper limb, including shoulder: Secondary | ICD-10-CM | POA: Diagnosis not present

## 2018-01-24 DIAGNOSIS — D2261 Melanocytic nevi of right upper limb, including shoulder: Secondary | ICD-10-CM | POA: Diagnosis not present

## 2018-01-24 DIAGNOSIS — L821 Other seborrheic keratosis: Secondary | ICD-10-CM | POA: Diagnosis not present

## 2018-01-24 DIAGNOSIS — L570 Actinic keratosis: Secondary | ICD-10-CM | POA: Diagnosis not present

## 2018-01-24 DIAGNOSIS — D2271 Melanocytic nevi of right lower limb, including hip: Secondary | ICD-10-CM | POA: Diagnosis not present

## 2018-01-24 DIAGNOSIS — Z08 Encounter for follow-up examination after completed treatment for malignant neoplasm: Secondary | ICD-10-CM | POA: Diagnosis not present

## 2018-01-24 DIAGNOSIS — D225 Melanocytic nevi of trunk: Secondary | ICD-10-CM | POA: Diagnosis not present

## 2018-01-24 DIAGNOSIS — Z85828 Personal history of other malignant neoplasm of skin: Secondary | ICD-10-CM | POA: Diagnosis not present

## 2018-01-24 DIAGNOSIS — X32XXXA Exposure to sunlight, initial encounter: Secondary | ICD-10-CM | POA: Diagnosis not present

## 2018-02-04 ENCOUNTER — Ambulatory Visit
Admission: RE | Admit: 2018-02-04 | Discharge: 2018-02-04 | Disposition: A | Discharge status: Home / Self Care | Payer: PPO | Source: Ambulatory Visit | Attending: Internal Medicine | Admitting: Internal Medicine

## 2018-02-04 DIAGNOSIS — J439 Emphysema, unspecified: Secondary | ICD-10-CM | POA: Diagnosis not present

## 2018-02-04 DIAGNOSIS — C3411 Malignant neoplasm of upper lobe, right bronchus or lung: Secondary | ICD-10-CM | POA: Insufficient documentation

## 2018-02-04 DIAGNOSIS — D3502 Benign neoplasm of left adrenal gland: Secondary | ICD-10-CM | POA: Insufficient documentation

## 2018-02-04 DIAGNOSIS — I700 Atherosclerosis of aorta: Secondary | ICD-10-CM | POA: Insufficient documentation

## 2018-02-04 HISTORY — DX: Malignant neoplasm of upper lobe, right bronchus or lung: C34.11

## 2018-02-04 IMAGING — CT CT CHEST W/O CM
2 of 4 series · 15 of 36 positions shown, 18 images · non-contrast
Comparison: [DATE] chest CT.

CLINICAL DATA: Stage I right upper lobe lung adenocarcinoma status
post wedge resection. History of laryngeal cancer in [PC]. Former
smoker. Restaging.

EXAM:
CT CHEST WITHOUT CONTRAST
TECHNIQUE: Multidetector CT imaging of the chest was performed following the
standard protocol without IV contrast.

[Series 2: chest · axial · 0.72mm/px · z∈[-1286,-984]mm · 12 of 179 slices shown, 15 images (1 of 2)]
[im 14/179  mediastinal]
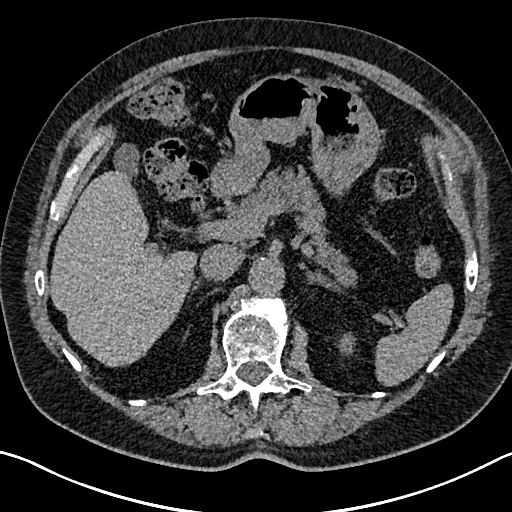
[im 14/179  lung]
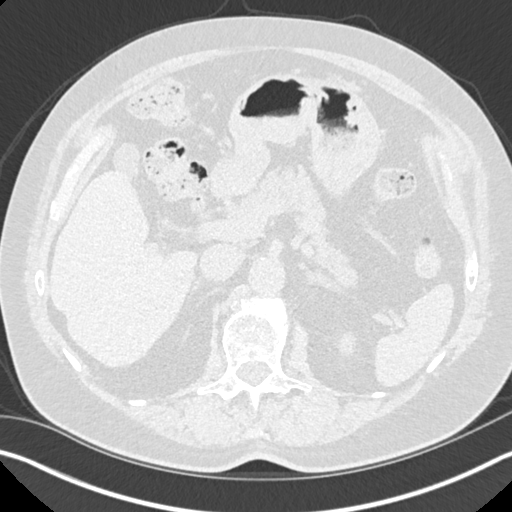
[im 28/179  lung]
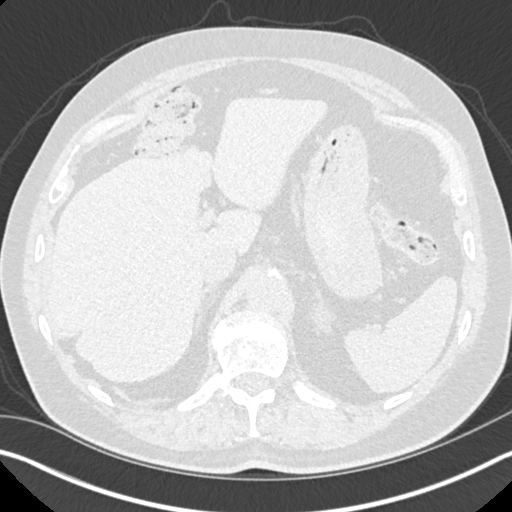
[im 42/179  lung]
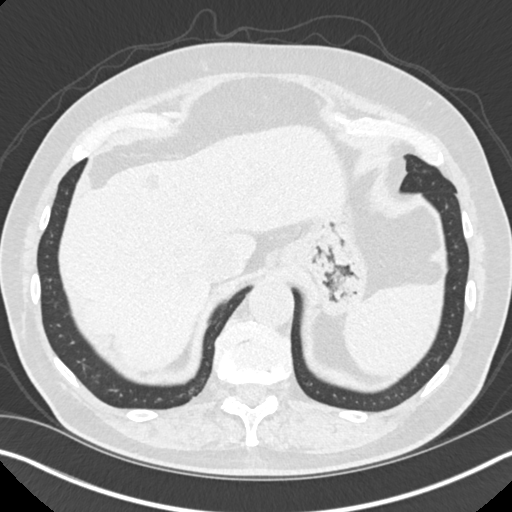
[im 55/179  lung]
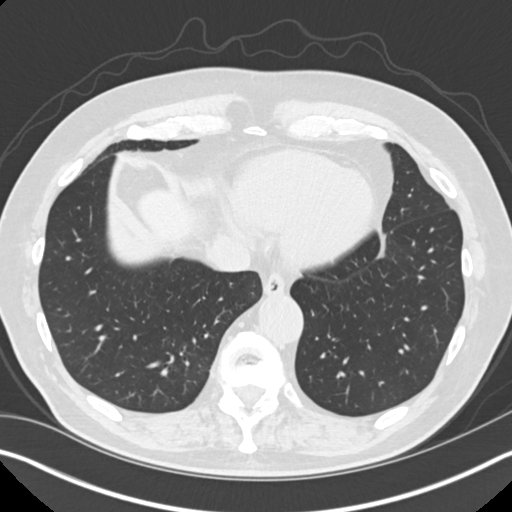
[im 69/179  mediastinal]
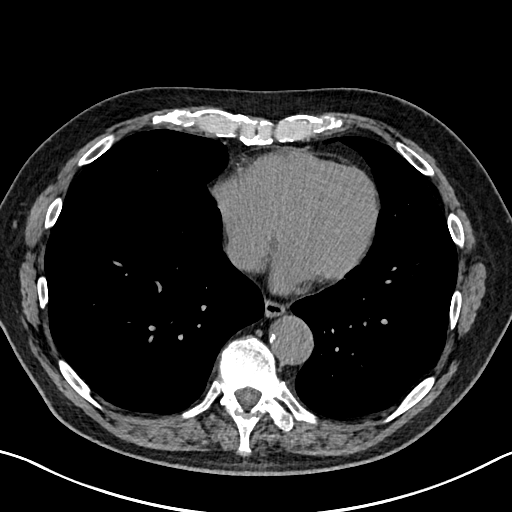
[im 69/179  lung]
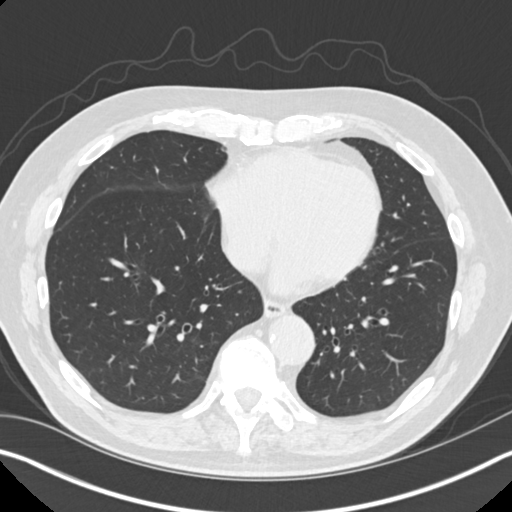
[im 83/179  lung]
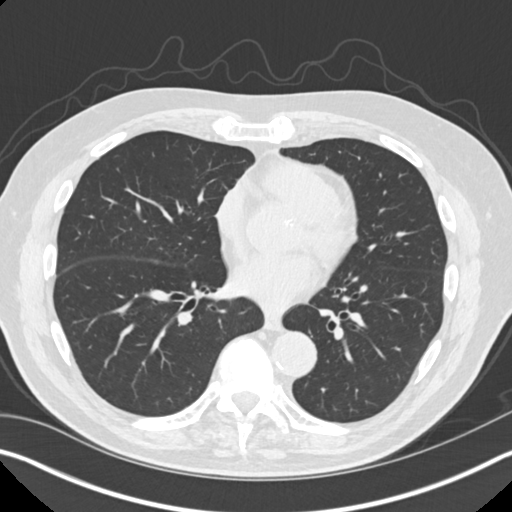
[im 96/179  lung]
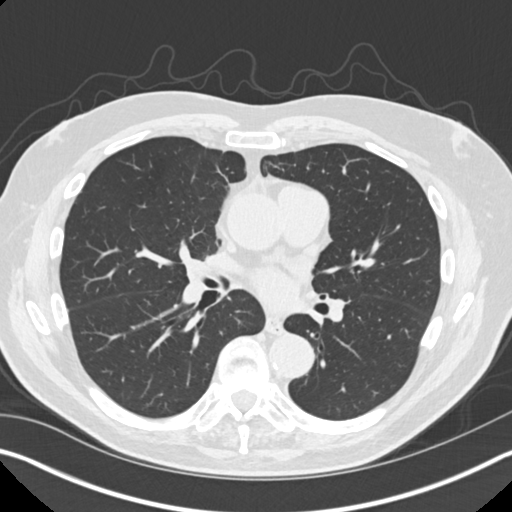
[im 110/179  lung]
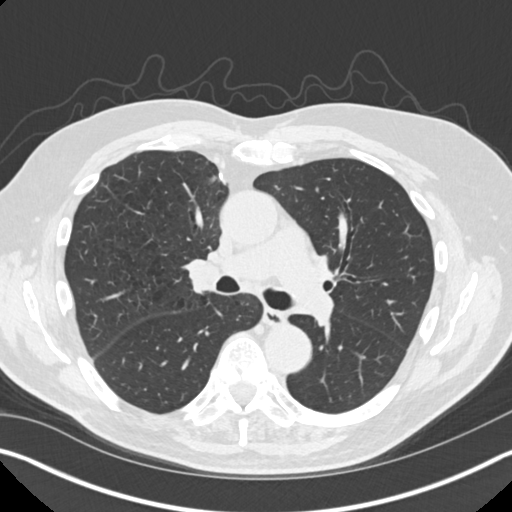
[im 124/179  mediastinal]
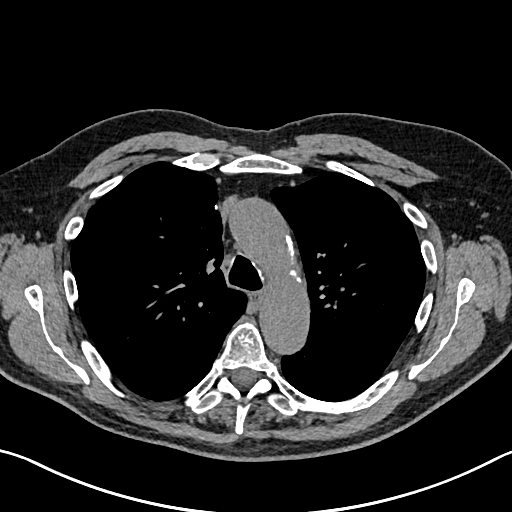
[im 124/179  lung]
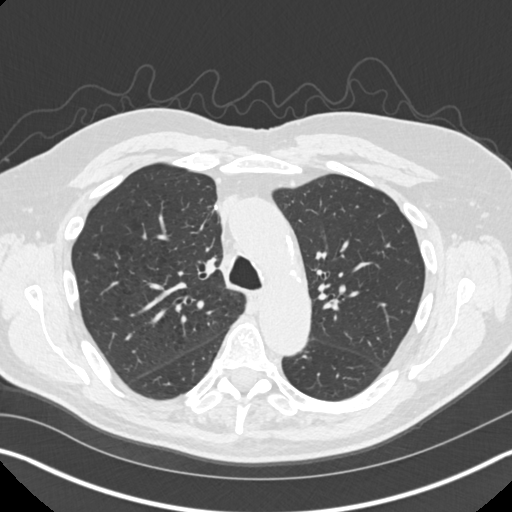
[im 137/179  lung]
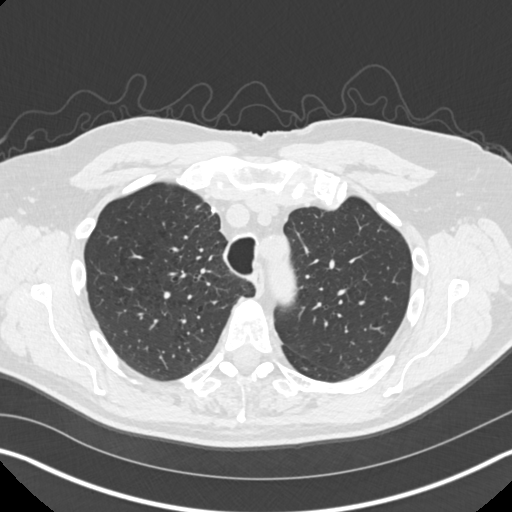
[im 151/179  lung]
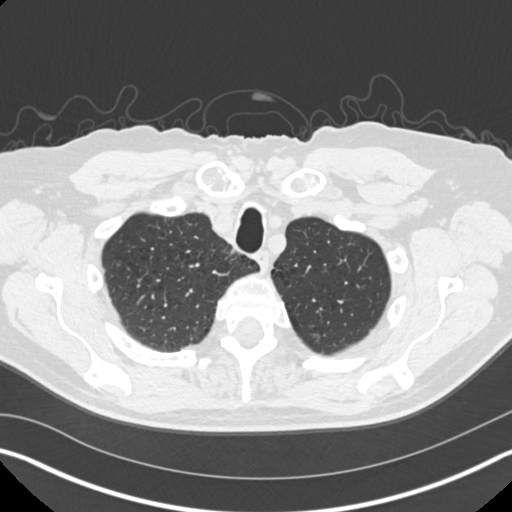
[im 165/179  lung]
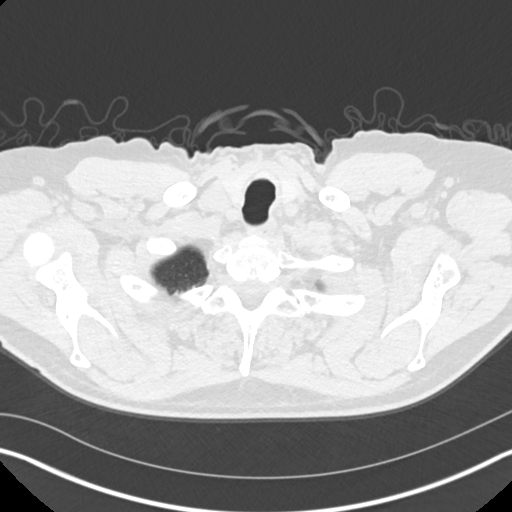

[Series 5: chest · coronal · 0.70mm/px · 3 of 166 slices shown (2 of 2)]
[im 34/166  lung]
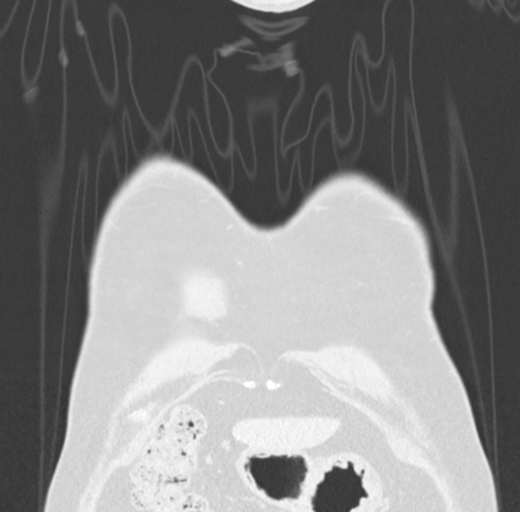
[im 67/166  lung]
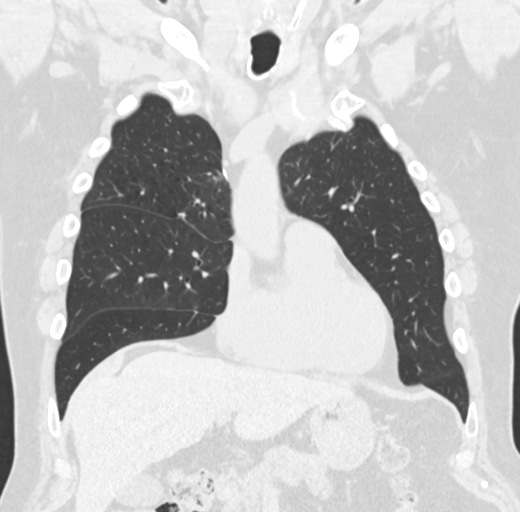
[im 100/166  lung]
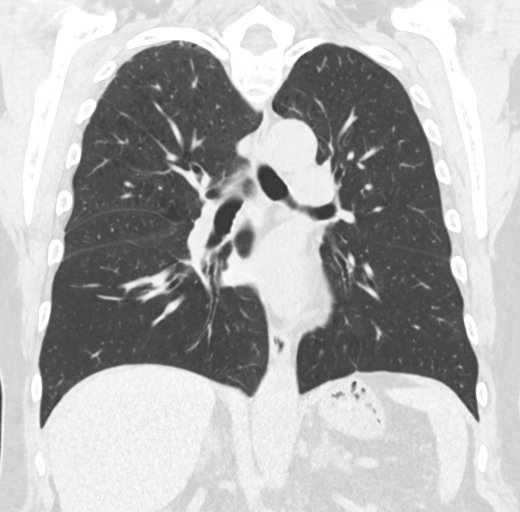

[15 of 36 positions shown; findings below may reference images not displayed]

FINDINGS: Cardiovascular: Normal heart size. No significant pericardial
effusion/thickening. Atherosclerotic nonaneurysmal thoracic aorta.
Normal caliber pulmonary arteries. Three-vessel coronary
atherosclerosis.

Mediastinum/Nodes: No discrete thyroid nodules. Unremarkable
esophagus. No pathologically enlarged axillary, mediastinal or hilar
lymph nodes, noting limited sensitivity for the detection of hilar
adenopathy on this noncontrast study.

Lungs/Pleura: No pneumothorax. No pleural effusion. Mild-to-moderate
centrilobular and paraseptal emphysema. Stable postsurgical changes
from medial right upper lobe wedge resection. Two solid right middle
lobe 4 mm pulmonary nodules are both stable since [DATE] chest
CT, most compatible with benign nodules. No acute consolidative
airspace disease, lung masses or new significant pulmonary nodules.

Upper abdomen: Simple 1.0 cm left liver lobe cyst. Subcentimeter
hypodense liver dome lesion is too small to characterize and is
stable since [DATE] CT, considered benign. Left adrenal 1.5 cm
adenoma is stable since [DATE] CT. Cholelithiasis.

Musculoskeletal: No aggressive appearing focal osseous lesions.
Moderate thoracic spondylosis.
IMPRESSION: 1. No evidence of local tumor recurrence status post right upper
lobe wedge resection.
2. No findings of metastatic disease in the chest.
3. Stable left adrenal adenoma.

Aortic Atherosclerosis ([PC]-[PC]) and Emphysema ([PC]-[PC]).

## 2018-02-09 ENCOUNTER — Inpatient Hospital Stay: Payer: PPO | Attending: Internal Medicine | Admitting: Internal Medicine

## 2018-02-09 ENCOUNTER — Encounter: Payer: Self-pay | Admitting: Internal Medicine

## 2018-02-09 VITALS — BP 182/80 | HR 84 | Temp 97.4°F | Resp 16 | Wt 213.0 lb

## 2018-02-09 DIAGNOSIS — Z87891 Personal history of nicotine dependence: Secondary | ICD-10-CM | POA: Diagnosis not present

## 2018-02-09 DIAGNOSIS — Z8521 Personal history of malignant neoplasm of larynx: Secondary | ICD-10-CM | POA: Diagnosis not present

## 2018-02-09 DIAGNOSIS — Z79899 Other long term (current) drug therapy: Secondary | ICD-10-CM | POA: Diagnosis not present

## 2018-02-09 DIAGNOSIS — C3411 Malignant neoplasm of upper lobe, right bronchus or lung: Secondary | ICD-10-CM

## 2018-02-09 DIAGNOSIS — I10 Essential (primary) hypertension: Secondary | ICD-10-CM

## 2018-02-09 NOTE — Assessment & Plan Note (Addendum)
#  Stage I adenocarcinoma of the lung; status post wedge resection/however clear margins.  November 2019 CT scan-no evidence of recurrence; stable benign 2 to 3 mm lung nodules.  Stable.  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  Monitor closely.  Stable  # DISPOSITION:  # follow up MD- labs-cbc/cmp-12 months/CT scan chest prior-Dr.B  # I reviewed the blood work- with the patient in detail; also reviewed the imaging independently [as summarized above]; and with the patient in detail.    Cc: Dr.Hedrrick.

## 2018-02-09 NOTE — Progress Notes (Signed)
[place] NOTE  Patient Care Team: [name], MD as PCP - General (Family Medicine) [name], MD as Consulting Physician (Family Medicine) [name], [name], MD (General Surgery)  CHIEF COMPLAINTS/PURPOSE OF CONSULTATION:  Lung cancer  #  Oncology History   # STAGE I- A. 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  - PARENCHYMAL MARGIN APPEARS CLEAR; TUMOR WAS 1 CM FROM MARGIN BEFORE  REMOVAL OF STAPLE LINE.; NO adjuvant therapy.   # Hx of Laryngeal cancer [? Stage I; s/p Surgery; [name]; s/p RT- [name]]  # #Atrial arrhythmia-postoperatively.  Short course of Eliquis.smoking-quit 2008.  ---------------------------------------    DIAGNOSIS: LUNG CA  STAGE:  I       ;GOALS: cure  CURRENT/MOST RECENT THERAPY: surveillaince      Primary cancer of right upper lobe of lung (HCC)     HISTORY OF PRESENTING ILLNESS:  Curtis Andrade. 72 y.o.  male right upper lung stage I lung cancer; history of laryngeal cancer [2010] is here for follow-up/review the results of the CT scan.  Patient was evaluated by thoracic surgery approximately 6 months ago CT scan negative for any recurrence at that time.  Patient appetite is good.  No weight loss.  No nausea no vomiting.  No shortness of breath.  He continues to exercise.  No bone pain.  Review of Systems  Constitutional: Negative for chills, diaphoresis, fever, malaise/fatigue and weight loss.  HENT: Negative for nosebleeds and sore throat.   Eyes: Negative for double vision.  Respiratory: Negative for cough, hemoptysis, sputum production, shortness of breath and wheezing.   Cardiovascular: Negative for chest pain, palpitations, orthopnea and leg swelling.  Gastrointestinal: Negative for abdominal pain, blood in stool, constipation, diarrhea, heartburn, melena, nausea and vomiting.   Genitourinary: Negative for dysuria, frequency and urgency.  Musculoskeletal: Negative for back pain and joint pain.  Skin: Negative.  Negative for itching and rash.  Neurological: Negative for dizziness, tingling, focal weakness, weakness and headaches.  Endo/Heme/Allergies: Does not bruise/bleed easily.  Psychiatric/Behavioral: Negative for depression. The patient is not nervous/anxious and does not have insomnia.      MEDICAL HISTORY:  Past Medical History:  Diagnosis Date  . 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 Colon polyp 2013  . Hemorrhoids    pt denies  . 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 Baruch Gouty and Dr. Nadeen Landau  . Phlebitis 1998  . Primary cancer of right upper lobe of lung ([place]) 01/14/2017   Dr. Genevive Bi performed a wedge resection of RUL.   . 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/27/2016    SURGICAL HISTORY: Past Surgical History:  Procedure Laterality Date  . COLONOSCOPY  2013   Dr Vira Agar  . COLONOSCOPY WITH PROPOFOL N/A 12/23/2016   Procedure: COLONOSCOPY WITH PROPOFOL;  Surgeon: Robert Bellow, MD;  Location: [facility] ENDOSCOPY;  Service: Endoscopy;  Laterality: N/A;  . HERNIA REPAIR Right 1990's  . THORACOTOMY Right 01/14/2017   Procedure: RIGHT THORACOSCOPYWITH WIDE WEDGE RESECTION,  PREOP BROCHOSCOPY;  Surgeon: [name], MD;  Location: [facility] ORS;  Service: General;  Laterality: Right;  . THROAT SURGERY  2010    SOCIAL HISTORY: Social History   Socioeconomic History  . Marital status: Married    Spouse name: Not on file  . Number of children: Not on file  .  Years of education: Not on file  . Highest education level: Not on file  Occupational History  . Not on file  Social Needs  . Financial resource strain: Not on file  . Food insecurity:    Worry: Not on file    Inability: Not on file  . Transportation needs:    Medical: Not on file     Non-medical: Not on file  Tobacco Use  . Smoking status: Former Smoker    Years: 50.00    Last attempt to quit: 03/31/2007    Years since quitting: 10.8  . Smokeless tobacco: Never Used  Substance and Sexual Activity  . Alcohol use: No  . Drug use: No  . Sexual activity: Not on file  Lifestyle  . Physical activity:    Days per week: Not on file    Minutes per session: Not on file  . Stress: Not on file  Relationships  . Social connections:    Talks on phone: Not on file    Gets together: Not on file    Attends religious service: Not on file    Active member of club or organization: Not on file    Attends meetings of clubs or organizations: Not on file    Relationship status: Not on file  . Intimate partner violence:    Fear of current or ex partner: Not on file    Emotionally abused: Not on file    Physically abused: Not on file    Forced sexual activity: Not on file  Other Topics Concern  . Not on file  Social History Narrative  . Not on file    FAMILY HISTORY: Family History  Problem Relation Age of Onset  . Alzheimer's disease Mother   . Alzheimer's disease Father   . Stroke Father   . Colon cancer Neg Hx     ALLERGIES:  has No Known Allergies.  MEDICATIONS:  Current Outpatient Medications  Medication Sig Dispense Refill  . Ascorbic Acid (VITAMIN C) 1000 MG tablet Take 1,000 mg by mouth daily.    . ASHWAGANDHA PO Take 1 tablet by mouth 2 (two) times daily.     . folic acid (FOLVITE) 562 MCG tablet Take 800 mcg by mouth daily.    . Glucosamine-Chondroitin (COSAMIN DS PO) Take 1 tablet by mouth daily.    . Multiple Vitamin (MULTIVITAMIN WITH MINERALS) TABS tablet Take 1 tablet by mouth daily. Senior Multivitamin.    . Omega-3 1000 MG CAPS Take 1 tablet by mouth 1 day or 1 dose.    Marland Kitchen omeprazole (PRILOSEC) 40 MG capsule Take 40 mg by mouth daily before supper.     . vitamin E 400 UNIT capsule Take 1 capsule by mouth 1 day or 1 dose.     No current  facility-administered medications for this visit.       Marland Kitchen  PHYSICAL EXAMINATION: ECOG PERFORMANCE STATUS: 0 - Asymptomatic  Vitals:   02/09/18 1104  BP: (!) 182/80  Pulse: 84  Resp: 16  Temp: (!) 97.4 F (36.3 C)   Filed Weights   02/09/18 1107  Weight: 213 lb (96.6 kg)    Physical Exam  Constitutional: He is oriented to person, place, and time and well-developed, well-nourished, and in no distress.  HENT:  Head: Normocephalic and atraumatic.  Mouth/Throat: Oropharynx is clear and moist. No oropharyngeal exudate.  Eyes: Pupils are equal, round, and reactive to light.  Neck: Normal range of motion. Neck supple.  Cardiovascular: Normal rate and  regular rhythm.  Pulmonary/Chest: No respiratory distress. He has no wheezes.  Abdominal: Soft. Bowel sounds are normal. He exhibits no distension and no mass. There is no tenderness. There is no rebound and no guarding.  Musculoskeletal: Normal range of motion. He exhibits no edema or tenderness.  Neurological: He is alert and oriented to person, place, and time.  Skin: Skin is warm.  Psychiatric: Affect normal.  ;  LABORATORY DATA:  I have reviewed the data as listed Lab Results  Component Value Date   WBC 6.9 02/04/2017   HGB 13.8 02/04/2017   HCT 41.9 02/04/2017   MCV 95.2 02/04/2017   PLT 228 02/04/2017   No results for input(s): NA, K, CL, CO2, GLUCOSE, BUN, CREATININE, CALCIUM, GFRNONAA, GFRAA, PROT, ALBUMIN, AST, ALT, ALKPHOS, BILITOT, BILIDIR, IBILI in the last 8760 hours.  RADIOGRAPHIC STUDIES: I have personally reviewed the radiological images as listed and agreed with the findings in the report. 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 History of laryngeal cancer in 2010. Former smoker. Restaging. EXAM: CT CHEST WITHOUT CONTRAST TECHNIQUE: Multidetector CT imaging of the chest was performed following the standard  protocol without IV contrast. COMPARISON:  08/26/2017 chest CT. FINDINGS: Cardiovascular: Normal heart size. No significant pericardial effusion/thickening. Atherosclerotic nonaneurysmal thoracic aorta. Normal caliber pulmonary arteries. Three-vessel coronary atherosclerosis. Mediastinum/Nodes: No discrete thyroid nodules. Unremarkable esophagus. No pathologically enlarged axillary, mediastinal or hilar lymph nodes, noting limited sensitivity for the detection of hilar adenopathy on this noncontrast study. Lungs/Pleura: No pneumothorax. No pleural effusion. Mild-to-moderate centrilobular and paraseptal emphysema. Stable postsurgical changes from medial right upper lobe wedge resection. Two solid right middle lobe 4 mm pulmonary nodules are both stable since 11/10/2016 chest CT, most compatible with benign nodules. No acute consolidative airspace disease, lung masses or new significant pulmonary nodules. Upper abdomen: Simple 1.0 cm left liver lobe cyst. Subcentimeter hypodense liver dome lesion is too small to characterize and is stable since 11/10/2016 CT, considered benign. Left adrenal 1.5 cm adenoma is stable since 11/10/2016 CT. Cholelithiasis. Musculoskeletal: No aggressive appearing focal osseous lesions. Moderate thoracic spondylosis. IMPRESSION: 1. No evidence of local tumor recurrence status post right upper lobe wedge resection. 2. No findings of metastatic disease in the chest. 3. Stable left adrenal adenoma. Aortic Atherosclerosis (ICD10-I70.0) and Emphysema (ICD10-J43.9). Electronically Signed   By: Ilona Sorrel M.D.   On: 02/04/2018 12:43    ASSESSMENT & PLAN:   Primary cancer of right upper lobe of lung ([place]) #Stage I adenocarcinoma of the lung; status post wedge resection/however clear margins.  November 2019 CT scan-no evidence of recurrence; stable benign 2 to 3 mm lung nodules.  Stable.  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  Monitor closely.  Stable  # DISPOSITION:  # follow up MD- labs-cbc/cmp-12 months/CT scan chest prior-Dr.B  # I reviewed the blood work- with the patient in detail; also reviewed the imaging independently [as summarized above]; and with the patient in detail.    Cc: Dr.Hedrrick.    All questions were answered. The patient knows to call the clinic with any problems, questions or concerns.    Cammie Sickle, MD 02/09/2018 1:10 PM

## 2018-09-26 DIAGNOSIS — D2261 Melanocytic nevi of right upper limb, including shoulder: Secondary | ICD-10-CM | POA: Diagnosis not present

## 2018-09-26 DIAGNOSIS — D2271 Melanocytic nevi of right lower limb, including hip: Secondary | ICD-10-CM | POA: Diagnosis not present

## 2018-09-26 DIAGNOSIS — D2272 Melanocytic nevi of left lower limb, including hip: Secondary | ICD-10-CM | POA: Diagnosis not present

## 2018-09-26 DIAGNOSIS — L570 Actinic keratosis: Secondary | ICD-10-CM | POA: Diagnosis not present

## 2018-09-26 DIAGNOSIS — Z85828 Personal history of other malignant neoplasm of skin: Secondary | ICD-10-CM | POA: Diagnosis not present

## 2018-09-26 DIAGNOSIS — D225 Melanocytic nevi of trunk: Secondary | ICD-10-CM | POA: Diagnosis not present

## 2018-09-26 DIAGNOSIS — L821 Other seborrheic keratosis: Secondary | ICD-10-CM | POA: Diagnosis not present

## 2018-09-26 DIAGNOSIS — D2262 Melanocytic nevi of left upper limb, including shoulder: Secondary | ICD-10-CM | POA: Diagnosis not present

## 2018-09-26 DIAGNOSIS — Z08 Encounter for follow-up examination after completed treatment for malignant neoplasm: Secondary | ICD-10-CM | POA: Diagnosis not present

## 2018-09-26 DIAGNOSIS — X32XXXA Exposure to sunlight, initial encounter: Secondary | ICD-10-CM | POA: Diagnosis not present

## 2018-11-07 DIAGNOSIS — Z0000 Encounter for general adult medical examination without abnormal findings: Secondary | ICD-10-CM | POA: Diagnosis not present

## 2018-11-07 DIAGNOSIS — Z125 Encounter for screening for malignant neoplasm of prostate: Secondary | ICD-10-CM | POA: Diagnosis not present

## 2018-11-07 DIAGNOSIS — I10 Essential (primary) hypertension: Secondary | ICD-10-CM | POA: Diagnosis not present

## 2018-11-10 DIAGNOSIS — Z0000 Encounter for general adult medical examination without abnormal findings: Secondary | ICD-10-CM | POA: Diagnosis not present

## 2018-11-10 DIAGNOSIS — K219 Gastro-esophageal reflux disease without esophagitis: Secondary | ICD-10-CM | POA: Diagnosis not present

## 2018-11-10 DIAGNOSIS — I10 Essential (primary) hypertension: Secondary | ICD-10-CM | POA: Diagnosis not present

## 2018-12-28 DIAGNOSIS — Z20828 Contact with and (suspected) exposure to other viral communicable diseases: Secondary | ICD-10-CM | POA: Diagnosis not present

## 2019-02-08 ENCOUNTER — Other Ambulatory Visit: Payer: Self-pay

## 2019-02-08 ENCOUNTER — Ambulatory Visit
Admission: RE | Admit: 2019-02-08 | Discharge: 2019-02-08 | Disposition: A | Discharge status: Home / Self Care | Payer: PPO | Source: Ambulatory Visit | Attending: Internal Medicine | Admitting: Internal Medicine

## 2019-02-08 DIAGNOSIS — C3411 Malignant neoplasm of upper lobe, right bronchus or lung: Secondary | ICD-10-CM | POA: Diagnosis not present

## 2019-02-08 DIAGNOSIS — C3491 Malignant neoplasm of unspecified part of right bronchus or lung: Secondary | ICD-10-CM | POA: Diagnosis not present

## 2019-02-08 LAB — POCT I-STAT CREATININE: Creatinine, Ser: 1.1 mg/dL (ref 0.61–1.24)

## 2019-02-08 IMAGING — CT CT CHEST W/ CM
2 of 4 series · 15 of 36 positions shown, 18 images · IV contrast (omnipaque)
Comparison: [DATE], [DATE]

CLINICAL DATA: Follow-up right lung cancer, history of laryngeal
cancer, right upper lobe cancer, status post right upper lobe wedge
resection

EXAM:
CT CHEST WITH CONTRAST
TECHNIQUE: Multidetector CT imaging of the chest was performed during
intravenous contrast administration.
CONTRAST:  75mL OMNIPAQUE IOHEXOL 300 MG/ML  SOLN

[Series 2: axial chest 2.00 · axial · 0.72mm/px · z∈[-1204,-906]mm · 12 of 177 slices shown, 15 images]
[im 14/177  mediastinal]
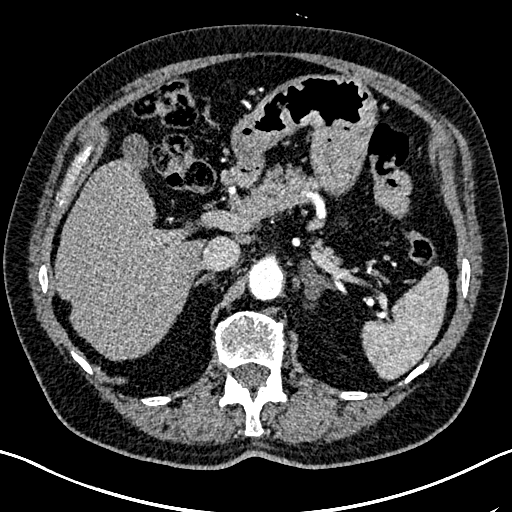
[im 14/177  lung]
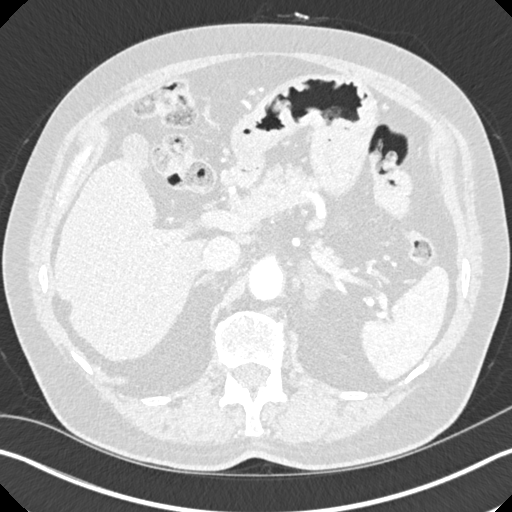
[im 28/177  lung]
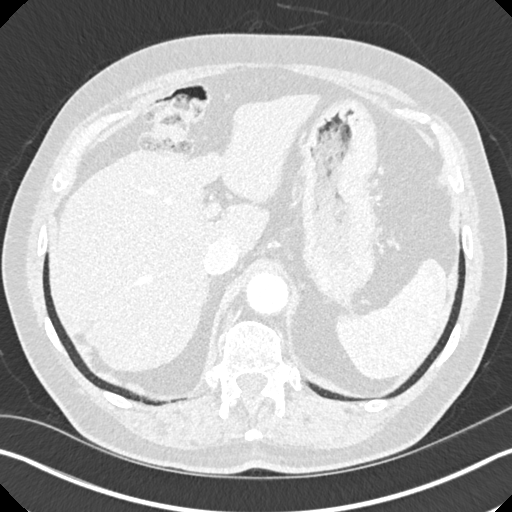
[im 41/177  lung]
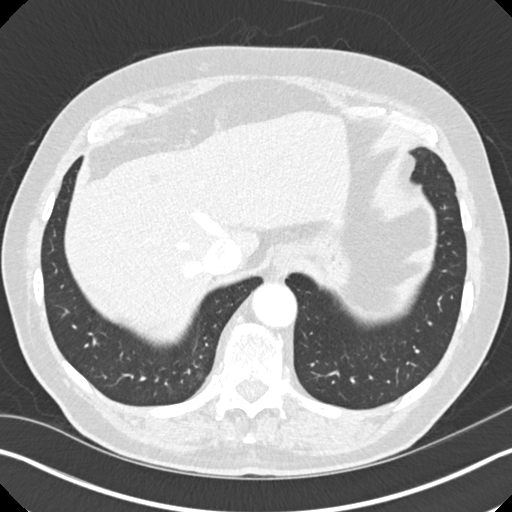
[im 55/177  lung]
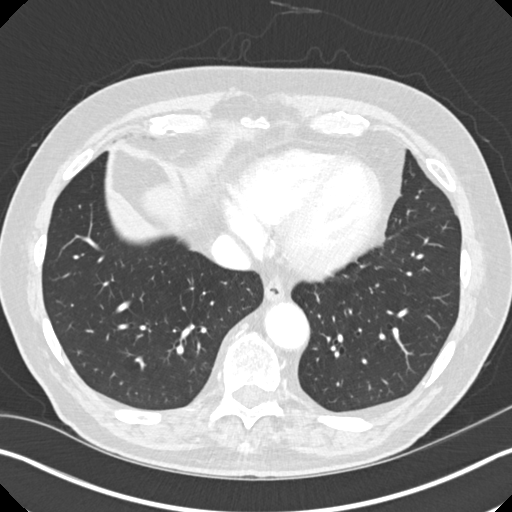
[im 68/177  mediastinal]
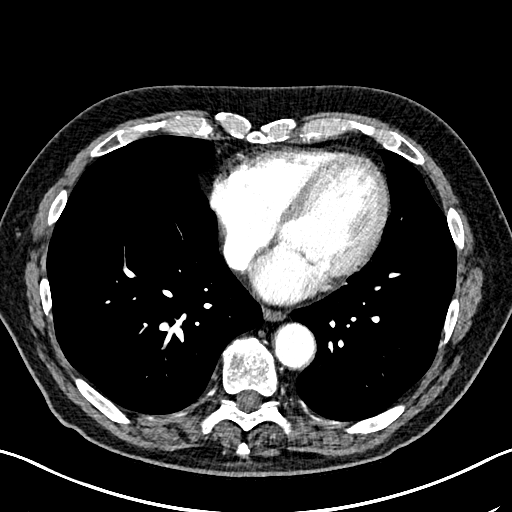
[im 68/177  lung]
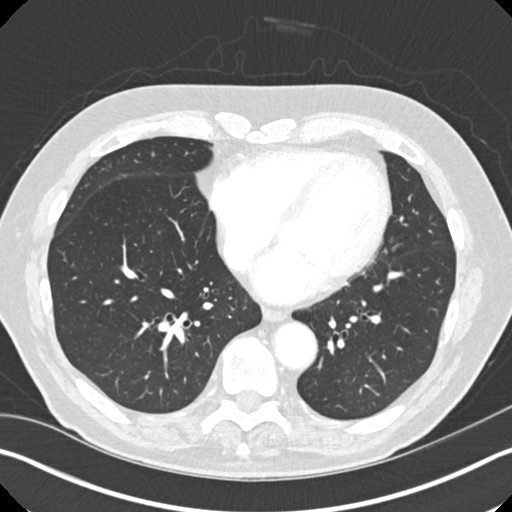
[im 82/177  lung]
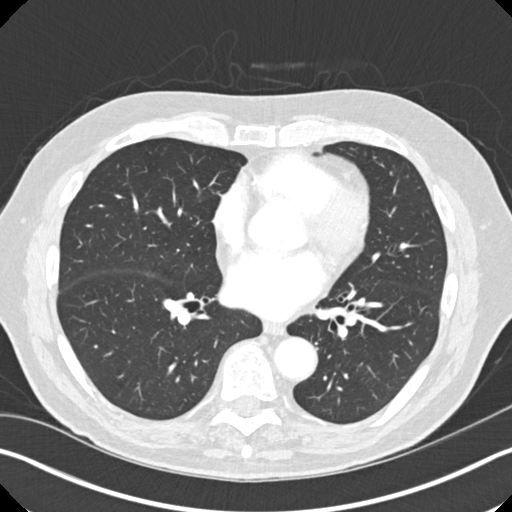
[im 95/177  lung]
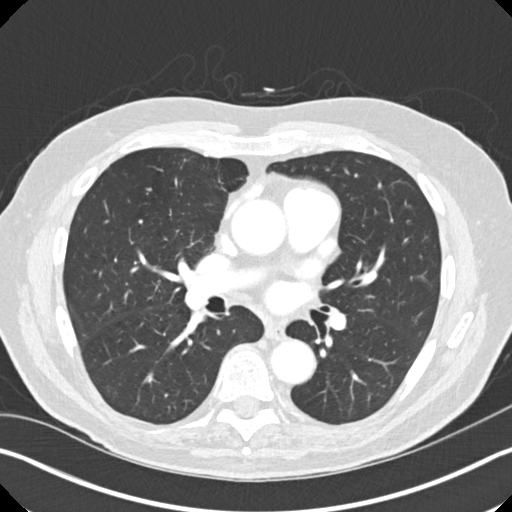
[im 109/177  lung]
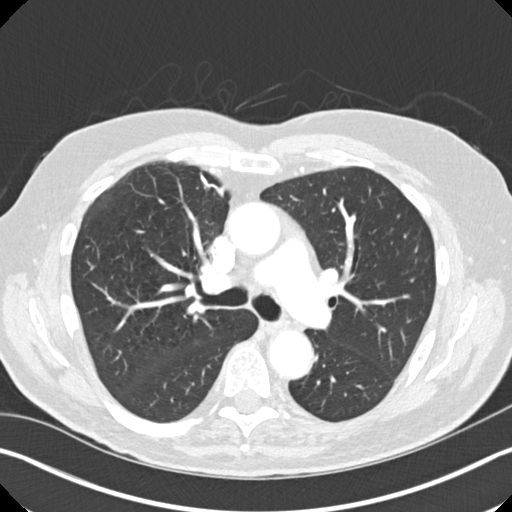
[im 122/177  mediastinal]
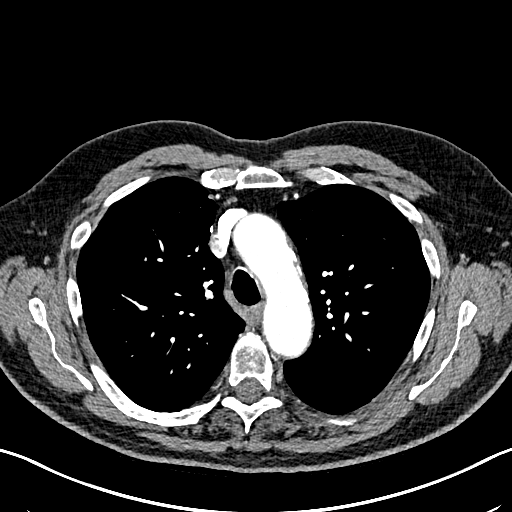
[im 122/177  lung]
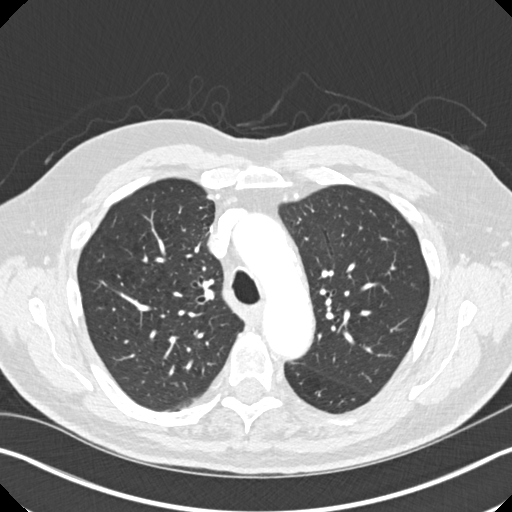
[im 136/177  lung]
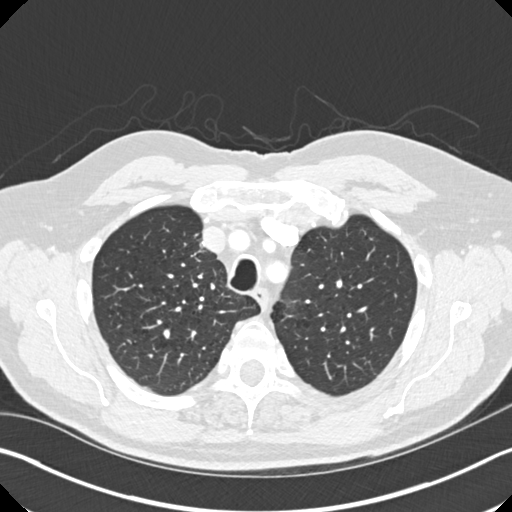
[im 149/177  lung]
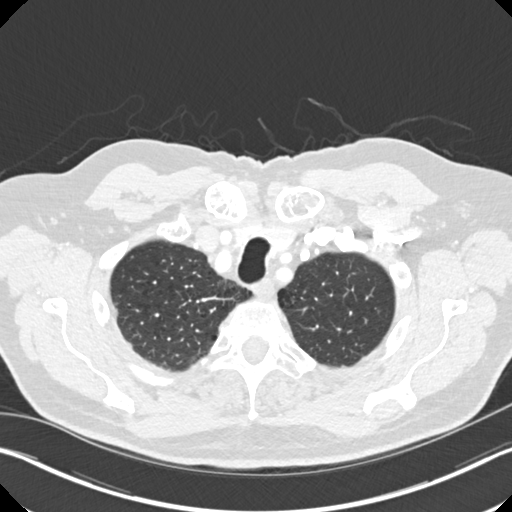
[im 163/177  lung]
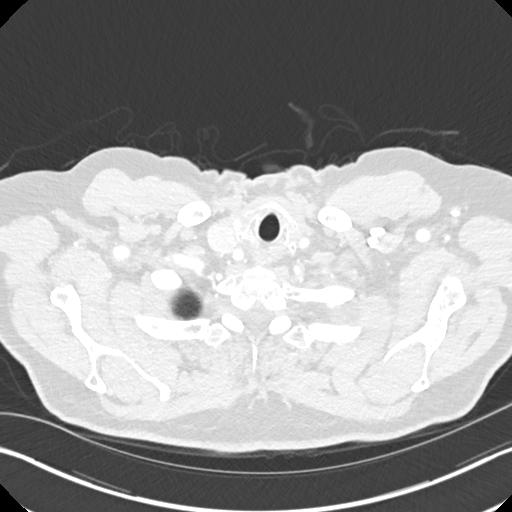

[Series 4: coronal chest 2.00 cor · coronal · 0.69mm/px · 3 of 146 slices shown]
[im 30/146  lung]
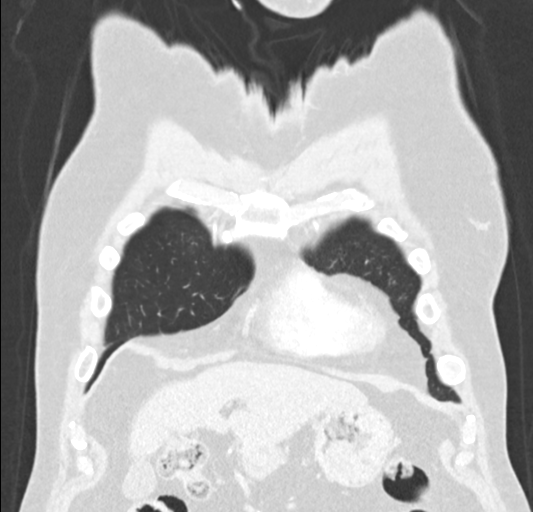
[im 59/146  lung]
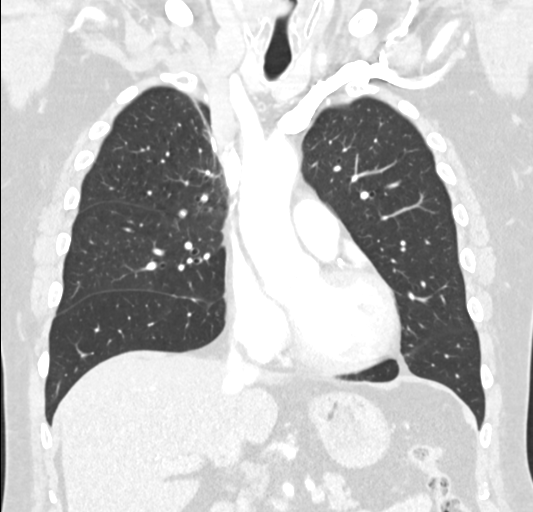
[im 88/146  lung]
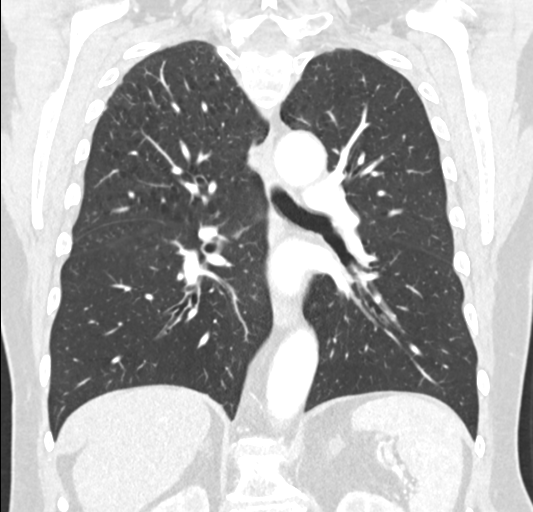

[15 of 36 positions shown; findings below may reference images not displayed]

FINDINGS: Cardiovascular: Aortic atherosclerosis. Normal heart size.
Three-vessel coronary artery calcifications. No pericardial
effusion.

Mediastinum/Nodes: No enlarged mediastinal, hilar, or axillary lymph
nodes. Thyroid gland, trachea, and esophagus demonstrate no
significant findings.

Lungs/Pleura: Centrilobular emphysema. Status post right upper lobe
wedge resection. Multiple stable, benign small pulmonary nodules
measuring up to 6 mm in the lateral segment right middle lobe
(series 3, image 91). No pleural effusion or pneumothorax.

Upper Abdomen: No acute abnormality. Stable, benign non nodular
thickening of the left adrenal gland. Hepatic steatosis.

Musculoskeletal: No chest wall mass or suspicious bone lesions
identified.
IMPRESSION: 1. Stable postoperative findings of right upper lobe wedge
resection. No evidence of malignant recurrence or metastatic disease
in the chest.

2. Multiple stable, benign small pulmonary nodules measuring up to 6
mm in the lateral segment right middle lobe (series 3, image 91).

3.  Emphysema ([72]-[72]).

4.  Coronary artery disease.  Aortic Atherosclerosis ([72]-[72]).

5.  Hepatic steatosis.

## 2019-02-08 MED ORDER — IOHEXOL 300 MG/ML  SOLN
75.0000 mL | Freq: Once | INTRAMUSCULAR | Status: AC | PRN
  Administered 2019-02-08: 75 mL via INTRAVENOUS

## 2019-02-09 ENCOUNTER — Encounter: Payer: Self-pay | Admitting: Internal Medicine

## 2019-02-09 ENCOUNTER — Other Ambulatory Visit: Payer: Self-pay

## 2019-02-09 NOTE — Progress Notes (Signed)
Pre-visit assessment completed prior to [place] [facility] follow-up with Dr. Rogue Bussing on 02/10/2019. No concerns identified.

## 2019-02-10 ENCOUNTER — Inpatient Hospital Stay (HOSPITAL_BASED_OUTPATIENT_CLINIC_OR_DEPARTMENT_OTHER): Payer: PPO | Admitting: Internal Medicine

## 2019-02-10 ENCOUNTER — Other Ambulatory Visit: Payer: Self-pay

## 2019-02-10 ENCOUNTER — Other Ambulatory Visit: Payer: Self-pay | Admitting: *Deleted

## 2019-02-10 ENCOUNTER — Encounter: Payer: Self-pay | Admitting: Internal Medicine

## 2019-02-10 ENCOUNTER — Inpatient Hospital Stay: Payer: PPO | Attending: Internal Medicine

## 2019-02-10 VITALS — BP 199/70 | HR 71 | Temp 97.0°F | Wt 217.4 lb

## 2019-02-10 DIAGNOSIS — Z87891 Personal history of nicotine dependence: Secondary | ICD-10-CM | POA: Diagnosis not present

## 2019-02-10 DIAGNOSIS — Z8521 Personal history of malignant neoplasm of larynx: Secondary | ICD-10-CM | POA: Diagnosis not present

## 2019-02-10 DIAGNOSIS — C3411 Malignant neoplasm of upper lobe, right bronchus or lung: Secondary | ICD-10-CM | POA: Insufficient documentation

## 2019-02-10 DIAGNOSIS — I10 Essential (primary) hypertension: Secondary | ICD-10-CM | POA: Insufficient documentation

## 2019-02-10 DIAGNOSIS — Z79899 Other long term (current) drug therapy: Secondary | ICD-10-CM | POA: Insufficient documentation

## 2019-02-10 LAB — COMPREHENSIVE METABOLIC PANEL
ALT: 26 U/L (ref 0–44)
AST: 29 U/L (ref 15–41)
Albumin: 4.3 g/dL (ref 3.5–5.0)
Alkaline Phosphatase: 44 U/L (ref 38–126)
Anion gap: 5 (ref 5–15)
BUN: 20 mg/dL (ref 8–23)
CO2: 27 mmol/L (ref 22–32)
Calcium: 9 mg/dL (ref 8.9–10.3)
Chloride: 105 mmol/L (ref 98–111)
Creatinine, Ser: 1 mg/dL (ref 0.61–1.24)
GFR calc Af Amer: >60 mL/min (ref >60)
GFR calc non Af Amer: >60 mL/min (ref >60)
Glucose, Bld: 109 mg/dL — ABNORMAL HIGH (ref 70–99)
Potassium: 4.3 mmol/L (ref 3.5–5.1)
Sodium: 137 mmol/L (ref 135–145)
Total Bilirubin: 1 mg/dL (ref 0.3–1.2)
Total Protein: 7.4 g/dL (ref 6.5–8.1)

## 2019-02-10 LAB — CBC WITH DIFFERENTIAL/PLATELET
Abs Immature Granulocytes: 0.01 10*3/uL (ref 0.00–0.07)
Basophils Absolute: 0.1 10*3/uL (ref 0.0–0.1)
Basophils Relative: 1 %
Eosinophils Absolute: 0.3 10*3/uL (ref 0.0–0.5)
Eosinophils Relative: 5 %
HCT: 45.1 % (ref 39.0–52.0)
Hemoglobin: 15.1 g/dL (ref 13.0–17.0)
Immature Granulocytes: 0 %
Lymphocytes Relative: 29 %
Lymphs Abs: 1.7 10*3/uL (ref 0.7–4.0)
MCH: 32.5 pg (ref 26.0–34.0)
MCHC: 33.5 g/dL (ref 30.0–36.0)
MCV: 97 fL (ref 80.0–100.0)
Monocytes Absolute: 0.5 10*3/uL (ref 0.1–1.0)
Monocytes Relative: 8 %
Neutro Abs: 3.4 10*3/uL (ref 1.7–7.7)
Neutrophils Relative %: 57 %
Platelets: 162 10*3/uL (ref 150–400)
RBC: 4.65 MIL/uL (ref 4.22–5.81)
RDW: 13.2 % (ref 11.5–15.5)
WBC: 5.9 10*3/uL (ref 4.0–10.5)
nRBC: 0 % (ref 0.0–0.2)

## 2019-02-10 NOTE — Assessment & Plan Note (Addendum)
#  Stage I adenocarcinoma of the lung; status post wedge resection/however clear margins.  November 2020- CT scan-no evidence of recurrence; STABLE;  benign 2 to 3 mm lung nodules.  STABLE.  #Regards to lung cancer would recommend continued surveillance on annual basis.  Patient agreement.  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  # Hypertension- [white FBPZ]025E- at home in 150s; monitor closely.   # DISPOSITION:  # follow up MD- labs-cbc/cmp-12 months/CT scan chest prior-Dr.B  # I reviewed the blood work- with the patient in detail; also reviewed the imaging independently [as summarized above]; and with the patient in detail.   Cc: Dr.Hedrrick.

## 2019-02-10 NOTE — Progress Notes (Signed)
Curtis Andrade, Curtis Andrade, Curtis Andrade, Curtis Andrade, Curtis Andrade, Curtis UPPER LOBE; WEDGE RESECTION: - INVASIVE ADENOCARCINOMA, 1.0 CM, SOLID PREDOMINANT (50% SOLID, 25%  LEPIDIC, 20% ACINAR 5% PAPILLARY).  - PARENCHYMAL MARGIN APPEARS CLEAR; TUMOR WAS 1 CM FROM MARGIN BEFORE REMOVAL OF STAPLE LINE.; NO adjuvant therapy.   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  Short course of Eliquis.smoking-quit 2008.  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 73 y.o.  male Curtis upper Andrade stage I Andrade cancer [2018]; history of laryngeal cancer [2010] is here for follow-up/review the results of the CT scan.  Patient denies any new lumps or bumps in his neck.  Appetite is good.  No hoarseness of voice.  No unusual cough or shortness of breath.  No bone pain.  Headaches.  Review of Systems  Constitutional: Negative for chills, diaphoresis, fever, malaise/fatigue and weight loss.  HENT: Negative for nosebleeds and sore throat.   Eyes: Negative for double vision.  Respiratory: Negative for cough, hemoptysis, sputum production, shortness of breath and wheezing.   Cardiovascular: Negative for chest pain, palpitations, orthopnea and leg swelling.  Gastrointestinal: Negative for abdominal pain, blood in stool, constipation, diarrhea, heartburn, melena, nausea and vomiting.  Genitourinary: Negative for dysuria, frequency and urgency.   Musculoskeletal: Negative for back pain and joint pain.  Skin: Negative.  Negative for itching and rash.  Neurological: Negative for dizziness, tingling, focal weakness, weakness and headaches.  Endo/Heme/Allergies: Does not bruise/bleed easily.  Psychiatric/Behavioral: Negative for depression. The patient is not nervous/anxious and does not have insomnia.      MEDICAL HISTORY:  Past Medical History:  Diagnosis Date  . 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 Colon polyp 2013  . Hemorrhoids    pt denies  . 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 Baruch Gouty and Dr. Nadeen Landau  . Phlebitis 1998  . Primary cancer of Curtis upper lobe of Andrade ([place]) 01/14/2017   Dr. Genevive Bi performed a wedge resection of RUL.   . 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/27/2016    SURGICAL HISTORY: Past Surgical History:  Procedure Laterality Date  . COLONOSCOPY  2013   Dr Vira Agar  . COLONOSCOPY WITH PROPOFOL N/A 12/23/2016   Procedure: COLONOSCOPY WITH PROPOFOL;  Surgeon: Robert Bellow, Curtis;  Location: [facility] ENDOSCOPY;  Service: Endoscopy;  Laterality: N/A;  . HERNIA REPAIR Curtis 1990's  . THORACOTOMY Curtis 01/14/2017   Procedure: Curtis THORACOSCOPYWITH WIDE WEDGE RESECTION,  PREOP BROCHOSCOPY;  Surgeon: [name], Curtis;  Location: [facility] ORS;  Service: General;  Laterality: Curtis;  . THROAT SURGERY  2010    SOCIAL HISTORY: Social History   Socioeconomic History  . Marital status: Married    Spouse name: Not on file  . Number of children: Not on file  . Years of education: Not on file  . Highest education level: Not  on file  Occupational History  . Not on file  Social Needs  . Financial resource strain: Not on file  . Food insecurity    Worry: Not on file    Inability: Not on file  . Transportation needs    Medical: Not on file    Non-medical: Not on file  Tobacco Use  . Smoking status: Former  Smoker    Years: 50.00    Quit date: 03/31/2007    Years since quitting: 11.8  . Smokeless tobacco: Never Used  Substance and Sexual Activity  . Alcohol use: No  . Drug use: No  . Sexual activity: Not on file  Lifestyle  . Physical activity    Days per week: Not on file    Minutes per session: Not on file  . Stress: Not on file  Relationships  . Social Herbalist on phone: Not on file    Gets together: Not on file    Attends religious service: Not on file    Active member of club or organization: Not on file    Attends meetings of clubs or organizations: Not on file    Relationship status: Not on file  . Intimate partner violence    Fear of current or ex partner: Not on file    Emotionally abused: Not on file    Physically abused: Not on file    Forced sexual activity: Not on file  Other Topics Concern  . Not on file  Social History Narrative  . Not on file    FAMILY HISTORY: Family History  Problem Relation Age of Onset  . Alzheimer's disease Mother   . Alzheimer's disease Father   . Stroke Father   . Colon cancer Neg Hx     ALLERGIES:  has No Known Allergies.  MEDICATIONS:  Current Outpatient Medications  Medication Sig Dispense Refill  . Ascorbic Acid (VITAMIN C) 1000 MG tablet Take 1,000 mg by mouth daily.    . folic acid (FOLVITE) 409 MCG tablet Take 800 mcg by mouth daily.    . Glucosamine-Chondroitin (COSAMIN DS PO) Take 1 tablet by mouth daily.    . Multiple Vitamin (MULTIVITAMIN WITH MINERALS) TABS tablet Take 1 tablet by mouth daily. Senior Multivitamin.    . Omega-3 1000 MG CAPS Take 1 tablet by mouth 1 day or 1 dose.    Marland Kitchen omeprazole (PRILOSEC) 40 MG capsule Take 40 mg by mouth daily before supper.     . vitamin E 400 UNIT capsule Take 1 capsule by mouth 1 day or 1 dose.    . ASHWAGANDHA PO Take 1 tablet by mouth 2 (two) times daily.      No current facility-administered medications for this visit.       Marland Kitchen  PHYSICAL EXAMINATION: ECOG  PERFORMANCE STATUS: 0 - Asymptomatic  Vitals:   02/09/19 1326  BP: (!) 199/70  Pulse: 71  Temp: (!) 97 F (36.1 C)   Filed Weights   02/09/19 1326  Weight: 217 lb 6 oz (98.6 kg)    Physical Exam  Constitutional: He is oriented to person, place, and time and well-developed, well-nourished, and in no distress.  HENT:  Head: Normocephalic and atraumatic.  Mouth/Throat: Oropharynx is clear and moist. No oropharyngeal exudate.  Eyes: Pupils are equal, round, and reactive to light.  Neck: Normal range of motion. Neck supple.  Cardiovascular: Normal rate and regular rhythm.  Pulmonary/Chest: No respiratory distress. He has no wheezes.  Abdominal: Soft. Bowel  sounds are normal. He exhibits no distension and no mass. There is no abdominal tenderness. There is no rebound and no guarding.  Musculoskeletal: Normal range of motion.        General: No tenderness or edema.  Neurological: He is alert and oriented to person, place, and time.  Skin: Skin is warm.  Psychiatric: Affect normal.  ;  LABORATORY DATA:  I have reviewed the data as listed Lab Results  Component Value Date   WBC 5.9 02/10/2019   HGB 15.1 02/10/2019   HCT 45.1 02/10/2019   MCV 97.0 02/10/2019   PLT 162 02/10/2019   Recent Labs    02/08/19 1008 02/10/19 1003  NA  --  137  K  --  4.3  CL  --  105  CO2  --  27  GLUCOSE  --  109*  BUN  --  20  CREATININE 1.10 1.00  CALCIUM  --  9.0  GFRNONAA  --  >60  GFRAA  --  >60  PROT  --  7.4  ALBUMIN  --  4.3  AST  --  29  ALT  --  26  ALKPHOS  --  44  BILITOT  --  1.0    RADIOGRAPHIC STUDIES: I have personally reviewed the radiological images as listed and agreed with the findings in the report. Ct Chest W Contrast  Result Date: 02/08/2019 CLINICAL DATA:  Follow-up Curtis Andrade cancer, history of laryngeal cancer, Curtis upper lobe cancer, status post Curtis upper lobe wedge resection EXAM: CT CHEST WITH CONTRAST TECHNIQUE: Multidetector CT imaging of the chest  was performed during intravenous contrast administration. CONTRAST:  41mL OMNIPAQUE IOHEXOL 300 MG/ML  SOLN COMPARISON:  02/04/2018, 08/26/2017 FINDINGS: Cardiovascular: Aortic atherosclerosis. Normal heart size. Three-vessel coronary artery calcifications. No pericardial effusion. Mediastinum/Nodes: No enlarged mediastinal, hilar, or axillary lymph nodes. Thyroid gland, trachea, and esophagus demonstrate no significant findings. Lungs/Pleura: Centrilobular emphysema. Status post Curtis upper lobe wedge resection. Multiple stable, benign small pulmonary nodules measuring up to 6 mm in the lateral segment Curtis middle lobe (series 3, image 91). No pleural effusion or pneumothorax. Upper Abdomen: No acute abnormality. Stable, benign non nodular thickening of the left adrenal gland. Hepatic steatosis. Musculoskeletal: No chest wall mass or suspicious bone lesions identified. IMPRESSION: 1. Stable postoperative findings of Curtis upper lobe wedge resection. No evidence of malignant recurrence or metastatic disease in the chest. 2. Multiple stable, benign small pulmonary nodules measuring up to 6 mm in the lateral segment Curtis middle lobe (series 3, image 91). 3.  Emphysema (ICD10-J43.9). 4.  Coronary artery disease.  Aortic Atherosclerosis (ICD10-I70.0). 5.  Hepatic steatosis. Electronically Signed   By: Eddie Candle M.D.   On: 02/08/2019 10:47    ASSESSMENT & PLAN:   Primary cancer of Curtis upper lobe of Andrade ([place]) #Stage I adenocarcinoma of the Andrade; status post wedge resection/however clear margins.  November 2020- CT scan-no evidence of recurrence; STABLE;  benign 2 to 3 mm Andrade nodules.  STABLE.  #Regards to Andrade cancer would recommend continued surveillance on annual basis.  Patient agreement.  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  # Hypertension- [white XBMW]413K- at home in 150s; monitor closely.   # DISPOSITION:  # follow up Curtis- labs-cbc/cmp-12 months/CT scan chest  prior-Dr.B  # I reviewed the blood work- with the patient in detail; also reviewed the imaging independently [as summarized above]; and with the patient in detail.   Cc: Dr.Hedrrick.    All  questions were answered. The patient knows to call the clinic with any problems, questions or concerns.    Cammie Sickle, Curtis 02/10/2019 12:53 PM

## 2019-03-10 ENCOUNTER — Other Ambulatory Visit
Admission: RE | Admit: 2019-03-10 | Discharge: 2019-03-10 | Disposition: A | Discharge status: Home / Self Care | Payer: PPO | Source: Ambulatory Visit | Attending: Orthopedic Surgery | Admitting: Orthopedic Surgery

## 2019-03-10 ENCOUNTER — Other Ambulatory Visit: Payer: Self-pay

## 2019-03-10 ENCOUNTER — Other Ambulatory Visit: Payer: Self-pay | Admitting: Orthopedic Surgery

## 2019-03-10 ENCOUNTER — Encounter (HOSPITAL_BASED_OUTPATIENT_CLINIC_OR_DEPARTMENT_OTHER): Payer: Self-pay | Admitting: Orthopedic Surgery

## 2019-03-10 DIAGNOSIS — M79645 Pain in left finger(s): Secondary | ICD-10-CM | POA: Diagnosis not present

## 2019-03-10 DIAGNOSIS — Z20828 Contact with and (suspected) exposure to other viral communicable diseases: Secondary | ICD-10-CM | POA: Insufficient documentation

## 2019-03-10 DIAGNOSIS — Z01812 Encounter for preprocedural laboratory examination: Secondary | ICD-10-CM | POA: Insufficient documentation

## 2019-03-10 DIAGNOSIS — M79642 Pain in left hand: Secondary | ICD-10-CM | POA: Diagnosis not present

## 2019-03-10 DIAGNOSIS — S66812A Strain of other specified muscles, fascia and tendons at wrist and hand level, left hand, initial encounter: Secondary | ICD-10-CM | POA: Diagnosis not present

## 2019-03-11 LAB — SARS CORONAVIRUS 2 (TAT 6-24 HRS): SARS Coronavirus 2: NEGATIVE

## 2019-03-14 ENCOUNTER — Ambulatory Visit (HOSPITAL_BASED_OUTPATIENT_CLINIC_OR_DEPARTMENT_OTHER): Payer: PPO | Admitting: Anesthesiology

## 2019-03-14 ENCOUNTER — Encounter (HOSPITAL_BASED_OUTPATIENT_CLINIC_OR_DEPARTMENT_OTHER)
Admission: RE | Disposition: A | Discharge status: Home / Self Care | Payer: Self-pay | Source: Home / Self Care | Attending: Orthopedic Surgery

## 2019-03-14 ENCOUNTER — Ambulatory Visit (HOSPITAL_BASED_OUTPATIENT_CLINIC_OR_DEPARTMENT_OTHER)
Admission: RE | Admit: 2019-03-14 | Discharge: 2019-03-14 | Disposition: A | Discharge status: Home / Self Care | Payer: PPO | Attending: Orthopedic Surgery | Admitting: Orthopedic Surgery

## 2019-03-14 DIAGNOSIS — Z87891 Personal history of nicotine dependence: Secondary | ICD-10-CM | POA: Insufficient documentation

## 2019-03-14 DIAGNOSIS — Z8521 Personal history of malignant neoplasm of larynx: Secondary | ICD-10-CM | POA: Insufficient documentation

## 2019-03-14 DIAGNOSIS — X58XXXA Exposure to other specified factors, initial encounter: Secondary | ICD-10-CM | POA: Insufficient documentation

## 2019-03-14 DIAGNOSIS — Z923 Personal history of irradiation: Secondary | ICD-10-CM | POA: Insufficient documentation

## 2019-03-14 DIAGNOSIS — S66212A Strain of extensor muscle, fascia and tendon of left thumb at wrist and hand level, initial encounter: Secondary | ICD-10-CM | POA: Diagnosis not present

## 2019-03-14 DIAGNOSIS — C329 Malignant neoplasm of larynx, unspecified: Secondary | ICD-10-CM | POA: Diagnosis not present

## 2019-03-14 DIAGNOSIS — K649 Unspecified hemorrhoids: Secondary | ICD-10-CM | POA: Diagnosis not present

## 2019-03-14 DIAGNOSIS — C3411 Malignant neoplasm of upper lobe, right bronchus or lung: Secondary | ICD-10-CM | POA: Diagnosis not present

## 2019-03-14 DIAGNOSIS — S66812A Strain of other specified muscles, fascia and tendons at wrist and hand level, left hand, initial encounter: Secondary | ICD-10-CM | POA: Diagnosis not present

## 2019-03-14 HISTORY — PX: TENDON REPAIR: SHX5111

## 2019-03-14 SURGERY — TENDON REPAIR
Anesthesia: Monitor Anesthesia Care | Site: Hand | Laterality: Left

## 2019-03-14 MED ORDER — ACETAMINOPHEN 160 MG/5ML PO SOLN: 325.0000 mg | ORAL | Status: DC | PRN

## 2019-03-14 MED ORDER — PROPOFOL 500 MG/50ML IV EMUL
INTRAVENOUS | Status: DC | PRN
  Administered 2019-03-14: 75 ug/kg/min via INTRAVENOUS

## 2019-03-14 MED ORDER — ONDANSETRON HCL 4 MG/2ML IJ SOLN
INTRAMUSCULAR | Status: DC | PRN
  Administered 2019-03-14: 4 mg via INTRAVENOUS

## 2019-03-14 MED ORDER — PROPOFOL 10 MG/ML IV BOLUS
INTRAVENOUS | Status: DC | PRN
  Administered 2019-03-14: 20 ug via INTRAVENOUS

## 2019-03-14 MED ORDER — FENTANYL CITRATE (PF) 100 MCG/2ML IJ SOLN
INTRAMUSCULAR | Status: AC
  Filled 2019-03-14: qty 2

## 2019-03-14 MED ORDER — ONDANSETRON HCL 4 MG/2ML IJ SOLN: 4.0000 mg | Freq: Once | INTRAMUSCULAR | Status: DC | PRN

## 2019-03-14 MED ORDER — MEPERIDINE HCL 25 MG/ML IJ SOLN: 6.2500 mg | INTRAMUSCULAR | Status: DC | PRN

## 2019-03-14 MED ORDER — CEFAZOLIN SODIUM-DEXTROSE 2-4 GM/100ML-% IV SOLN
2.0000 g | INTRAVENOUS | Status: AC
  Administered 2019-03-14: 12:00:00 2 g via INTRAVENOUS

## 2019-03-14 MED ORDER — TRAMADOL HCL 50 MG PO TABS: 50.0000 mg | ORAL_TABLET | Freq: Four times a day (QID) | ORAL | 0 refills | Status: DC | PRN

## 2019-03-14 MED ORDER — OXYCODONE HCL 5 MG PO TABS: 5.0000 mg | ORAL_TABLET | Freq: Once | ORAL | Status: DC | PRN

## 2019-03-14 MED ORDER — LACTATED RINGERS IV SOLN: INTRAVENOUS | Status: DC

## 2019-03-14 MED ORDER — FENTANYL CITRATE (PF) 100 MCG/2ML IJ SOLN
50.0000 ug | INTRAMUSCULAR | Status: DC | PRN
  Administered 2019-03-14: 50 ug via INTRAVENOUS

## 2019-03-14 MED ORDER — ROPIVACAINE HCL 7.5 MG/ML IJ SOLN
INTRAMUSCULAR | Status: DC | PRN
  Administered 2019-03-14: 20 mL via PERINEURAL

## 2019-03-14 MED ORDER — FENTANYL CITRATE (PF) 100 MCG/2ML IJ SOLN: 25.0000 ug | INTRAMUSCULAR | Status: DC | PRN

## 2019-03-14 MED ORDER — MIDAZOLAM HCL 2 MG/2ML IJ SOLN
1.0000 mg | INTRAMUSCULAR | Status: DC | PRN
  Administered 2019-03-14: 2 mg via INTRAVENOUS

## 2019-03-14 MED ORDER — ACETAMINOPHEN 325 MG PO TABS: 325.0000 mg | ORAL_TABLET | ORAL | Status: DC | PRN

## 2019-03-14 MED ORDER — OXYCODONE HCL 5 MG/5ML PO SOLN: 5.0000 mg | Freq: Once | ORAL | Status: DC | PRN

## 2019-03-14 MED ORDER — CEFAZOLIN SODIUM-DEXTROSE 2-4 GM/100ML-% IV SOLN
INTRAVENOUS | Status: AC
  Filled 2019-03-14: qty 100

## 2019-03-14 MED ORDER — MIDAZOLAM HCL 2 MG/2ML IJ SOLN
INTRAMUSCULAR | Status: AC
  Filled 2019-03-14: qty 2

## 2019-03-14 MED ORDER — DEXAMETHASONE SODIUM PHOSPHATE 4 MG/ML IJ SOLN
INTRAMUSCULAR | Status: DC | PRN
  Administered 2019-03-14: 10 mg via INTRAVENOUS

## 2019-03-14 MED ORDER — CHLORHEXIDINE GLUCONATE 4 % EX LIQD: 60.0000 mL | Freq: Once | CUTANEOUS | Status: DC

## 2019-03-14 SURGICAL SUPPLY — 68 items
BAG DECANTER FOR FLEXI CONT (MISCELLANEOUS) IMPLANT
BLADE MINI RND TIP GREEN BEAV (BLADE) IMPLANT
BLADE SURG 15 STRL LF DISP TIS (BLADE) ×1 IMPLANT
BLADE SURG 15 STRL SS (BLADE) ×2
BNDG COHESIVE 3X5 TAN STRL LF (GAUZE/BANDAGES/DRESSINGS) ×3 IMPLANT
BNDG ESMARK 4X9 LF (GAUZE/BANDAGES/DRESSINGS) ×3 IMPLANT
BNDG GAUZE ELAST 4 BULKY (GAUZE/BANDAGES/DRESSINGS) ×3 IMPLANT
CHLORAPREP W/TINT 26 (MISCELLANEOUS) ×3 IMPLANT
CORD BIPOLAR FORCEPS 12FT (ELECTRODE) ×3 IMPLANT
COTTONBALL LRG STERILE PKG (GAUZE/BANDAGES/DRESSINGS) IMPLANT
COVER BACK TABLE REUSABLE LG (DRAPES) ×3 IMPLANT
COVER MAYO STAND REUSABLE (DRAPES) ×3 IMPLANT
COVER WAND RF STERILE (DRAPES) IMPLANT
CUFF TOURN SGL QUICK 18X4 (TOURNIQUET CUFF) IMPLANT
DECANTER SPIKE VIAL GLASS SM (MISCELLANEOUS) IMPLANT
DRAIN TLS ROUND 10FR (DRAIN) IMPLANT
DRAPE EXTREMITY T 121X128X90 (DISPOSABLE) ×3 IMPLANT
DRAPE OEC MINIVIEW 54X84 (DRAPES) IMPLANT
DRAPE SURG 17X23 STRL (DRAPES) ×3 IMPLANT
GAUZE 4X4 16PLY RFD (DISPOSABLE) IMPLANT
GAUZE SPONGE 4X4 12PLY STRL (GAUZE/BANDAGES/DRESSINGS) ×3 IMPLANT
GAUZE XEROFORM 1X8 LF (GAUZE/BANDAGES/DRESSINGS) ×3 IMPLANT
GLOVE BIO SURGEON STRL SZ7 (GLOVE) ×3 IMPLANT
GLOVE BIOGEL PI IND STRL 8.5 (GLOVE) ×1 IMPLANT
GLOVE BIOGEL PI INDICATOR 8.5 (GLOVE) ×2
GLOVE SURG ORTHO 8.0 STRL STRW (GLOVE) ×3 IMPLANT
GOWN STRL REUS W/ TWL LRG LVL3 (GOWN DISPOSABLE) ×1 IMPLANT
GOWN STRL REUS W/TWL LRG LVL3 (GOWN DISPOSABLE) ×5 IMPLANT
GOWN STRL REUS W/TWL XL LVL3 (GOWN DISPOSABLE) ×3 IMPLANT
K-WIRE .035X4 (WIRE) IMPLANT
LOOP VESSEL MAXI BLUE (MISCELLANEOUS) IMPLANT
NEEDLE HYPO 22GX1.5 SAFETY (NEEDLE) IMPLANT
NEEDLE KEITH (NEEDLE) IMPLANT
NEEDLE PRECISIONGLIDE 27X1.5 (NEEDLE) IMPLANT
NS IRRIG 1000ML POUR BTL (IV SOLUTION) ×3 IMPLANT
PACK BASIN DAY SURGERY FS (CUSTOM PROCEDURE TRAY) ×3 IMPLANT
PAD CAST 3X4 CTTN HI CHSV (CAST SUPPLIES) ×1 IMPLANT
PADDING CAST ABS 4INX4YD NS (CAST SUPPLIES) ×2
PADDING CAST ABS COTTON 4X4 ST (CAST SUPPLIES) ×1 IMPLANT
PADDING CAST COTTON 3X4 STRL (CAST SUPPLIES) ×2
SLEEVE SCD COMPRESS KNEE MED (MISCELLANEOUS) IMPLANT
SLING ARM FOAM STRAP LRG (SOFTGOODS) ×3 IMPLANT
SPLINT PLASTER CAST XFAST 3X15 (CAST SUPPLIES) ×14 IMPLANT
SPLINT PLASTER XTRA FASTSET 3X (CAST SUPPLIES) ×28
STOCKINETTE 4X48 STRL (DRAPES) ×3 IMPLANT
SUT CHROMIC 5 0 P 3 (SUTURE) IMPLANT
SUT ETHIBOND 3-0 V-5 (SUTURE) IMPLANT
SUT ETHILON 4 0 PS 2 18 (SUTURE) ×3 IMPLANT
SUT FIBERWIRE 2-0 18 17.9 3/8 (SUTURE)
SUT FIBERWIRE 4-0 18 TAPR NDL (SUTURE)
SUT MERSILENE 2.0 SH NDLE (SUTURE) IMPLANT
SUT MERSILENE 4 0 P 3 (SUTURE) IMPLANT
SUT PROLENE 2 0 SH DA (SUTURE) IMPLANT
SUT SILK 2 0 PERMA HAND 18 BK (SUTURE) IMPLANT
SUT SILK 4 0 PS 2 (SUTURE) IMPLANT
SUT STEEL 3 0 (SUTURE) IMPLANT
SUT VIC AB 3-0 PS1 18 (SUTURE)
SUT VIC AB 3-0 PS1 18XBRD (SUTURE) IMPLANT
SUT VIC AB 4-0 P-3 18XBRD (SUTURE) IMPLANT
SUT VIC AB 4-0 P3 18 (SUTURE)
SUT VICRYL 4-0 PS2 18IN ABS (SUTURE) IMPLANT
SUTURE FIBERWR 2-0 18 17.9 3/8 (SUTURE) IMPLANT
SUTURE FIBERWR 4-0 18 TAPR NDL (SUTURE) IMPLANT
SYR BULB 3OZ (MISCELLANEOUS) ×3 IMPLANT
SYR CONTROL 10ML LL (SYRINGE) IMPLANT
TOWEL GREEN STERILE FF (TOWEL DISPOSABLE) ×6 IMPLANT
TUBE FEEDING ENTERAL 5FR 16IN (TUBING) IMPLANT
UNDERPAD 30X36 HEAVY ABSORB (UNDERPADS AND DIAPERS) ×3 IMPLANT

## 2019-03-14 NOTE — Anesthesia Preprocedure Evaluation (Addendum)
Anesthesia Evaluation  Patient identified by MRN, date of birth, ID band Patient awake    Reviewed: Allergy & Precautions, H&P , NPO status , Patient's Chart, lab work & pertinent test results  History of Anesthesia Complications Negative for: history of anesthetic complications  Airway Mallampati: I  TM Distance: >3 FB Neck ROM: Full    Dental  (+) Missing, Teeth Intact   Pulmonary neg sleep apnea, neg COPD, former smoker,    breath sounds clear to auscultation- rhonchi (-) wheezing      Cardiovascular Exercise Tolerance: Good (-) hypertension(-) CAD, (-) Past MI and (-) Cardiac Stents  Rhythm:Regular Rate:Normal - Systolic murmurs and - Diastolic murmurs    Neuro/Psych negative neurological ROS  negative psych ROS   GI/Hepatic negative GI ROS, Neg liver ROS,   Endo/Other  negative endocrine ROSneg diabetes  Renal/GU negative Renal ROS     Musculoskeletal negative musculoskeletal ROS (+)   Abdominal (+) - obese,   Peds  Hematology negative hematology ROS (+)   Anesthesia Other Findings Past Medical History: 2010: Cancer   throat /squamous cell with radiation 11/27/2016: Laryngeal cancer ([place])     Comment:  Overview:  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              Baruch Gouty and Dr. Nadeen Landau  Reproductive/Obstetrics                           Anesthesia Physical  Anesthesia Plan  ASA: III  Anesthesia Plan: MAC and Regional   Post-op Pain Management:    Induction: Intravenous  PONV Risk Score and Plan: 1 and Ondansetron, Dexamethasone and Treatment may vary due to age or medical condition  Airway Management Planned: Nasal Cannula, Simple Face Mask and Mask  Additional Equipment:   Intra-op Plan:   Post-operative Plan: Extubation in OR  Informed Consent: I have reviewed the patients History and  Physical, chart, labs and discussed the procedure including the risks, benefits and alternatives for the proposed anesthesia with the patient or authorized representative who has indicated his/her understanding and acceptance.     Dental advisory given  Plan Discussed with: CRNA, Anesthesiologist and Surgeon  Anesthesia Plan Comments:         Anesthesia Quick Evaluation

## 2019-03-14 NOTE — H&P (Signed)
Curtis Andrade. is an 73 y.o. male.   Chief Complaint: Rupture extensor pollicis longus tendon left thumb.  HPI: Curtis Andrade is a 73 year old ambidextrous male comes in with a complaint of the inability to bend his left thumb. He states it began 1210. He was he will moving his backpack when he felt a pop in his wrist. He has been unable to extend his finger since that time. He has no prior history of injury. He complains of pain in his thumb wrist with discomfort up his arm. Is not complaining of any numbness or tingling. He has no history of diabetes thyroid problems or gout. He does not have a history of arthritis. Family history is negative for each of these.    Past Medical History:  Diagnosis Date  . 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 Colon polyp 2013  . Hemorrhoids    pt denies  . 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 Baruch Gouty and Dr. Nadeen Landau  . Phlebitis 1998  . 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 Genevive Bi performed a wedge resection of RUL.   . 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/27/2016    Past Surgical History:  Procedure Laterality Date  . COLONOSCOPY  2013   Dr Vira Agar  . COLONOSCOPY WITH PROPOFOL N/A 12/23/2016   Procedure: COLONOSCOPY WITH PROPOFOL;  Surgeon: Robert Bellow, MD;  Location: [facility] ENDOSCOPY;  Service: Endoscopy;  Laterality: N/A;  . EYE SURGERY    . HERNIA REPAIR Right 1990's  . THORACOTOMY Right 01/14/2017   Procedure: RIGHT THORACOSCOPYWITH WIDE WEDGE RESECTION,  PREOP BROCHOSCOPY;  Surgeon: [name], MD;  Location: [facility] ORS;  Service: General;  Laterality: Right;  . THROAT SURGERY  2010    Family History  Problem Relation Age of Onset  . Alzheimer's disease Mother   . Alzheimer's disease Father   . Stroke Father   . Colon cancer Neg Hx    Social History:  reports that he quit smoking about 11 years ago. He quit  after 50.00 years of use. He has never used smokeless tobacco. He reports that he does not drink alcohol or use drugs.  Allergies: No Known Allergies  Medications Prior to Admission  Medication Sig Dispense Refill  . Ascorbic Acid (VITAMIN C) 1000 MG tablet Take 1,000 mg by mouth daily.    . folic acid (FOLVITE) 174 MCG tablet Take 800 mcg by mouth daily.    . Glucosamine-Chondroitin (COSAMIN DS PO) Take 1 tablet by mouth daily.    . Multiple Vitamin (MULTIVITAMIN WITH MINERALS) TABS tablet Take 1 tablet by mouth daily. Senior Multivitamin.    . Omega-3 1000 MG CAPS Take 1 tablet by mouth 1 day or 1 dose.    Marland Kitchen omeprazole (PRILOSEC) 40 MG capsule Take 40 mg by mouth daily before supper.     . vitamin E 400 UNIT capsule Take 1 capsule by mouth 1 day or 1 dose.      No results found for this or any previous visit (from the past 48 hour(s)).  No results found.   Pertinent items are noted in HPI.  Height 5\' 11"  (1.803 m), weight 95.3 kg.  General appearance: alert, cooperative and appears stated age Head: Normocephalic, without obvious abnormality Neck: no JVD Resp: clear to auscultation bilaterally Cardio: regular rate and rhythm, S1, S2 normal, no murmur, click, rub or gallop GI: soft,  non-tender; bowel sounds normal; no masses,  no organomegaly Extremities: Inability to extend the left thumb Pulses: 2+ and symmetric Skin: Skin color, texture, turgor normal. No rashes or lesions Neurologic: Grossly normal Incision/Wound: na  Assessment/Plan Assessment:  1. Left hand pain  Amb Ref to [facility] Therapy  2. Rupture of extensor tendons of left thumb   Plan: Discussed with him the problem. I would recommend exploration over the wrist. He is advised the potential for EIP transfer depending on the length of destroyed tendon. Him with loss discussed possibility of grafting using palmaris longus which is present. This will be scheduled as an outpatient he is aware that there is  no guarantee to the surgery possibility of infection recurrence injury to arteries nerves tendons complete relief of symptoms dystrophy. He is sent to therapy application of a splint. He is scheduled for repair grafting or transfer EIP to EPL tendon left hand.    [name] 03/14/2019, 10:06 AM

## 2019-03-14 NOTE — Progress Notes (Signed)
Assisted Dr. Ambrose Pancoast with left, ultrasound guided, axillary block. Side rails up, monitors on throughout procedure. See vital signs in flow sheet. Tolerated Procedure well.

## 2019-03-14 NOTE — Brief Op Note (Signed)
03/14/2019  12:31 PM  PATIENT:  [name]  73 y.o. male  PRE-OPERATIVE DIAGNOSIS:  RUPTURE EXTENSOR POLICIS LEFT THUMB  POST-OPERATIVE DIAGNOSIS:  RUPTURE EXTENSOR POLICIS LEFT THUMB  PROCEDURE:  Procedure(s) with comments: TENDON REPAIR AND GRAFT  TRANSFER EXTENSOR INDICIS PROPRIUS LEFT THUMB (Left) - AXILLARY BLOCK  SURGEON:  Surgeon(s) and Role:    * [name], MD - Primary  PHYSICIAN ASSISTANT:   ASSISTANTS: Robert Dasnoit PA-C  ANESTHESIA:   regional and IV sedation  EBL:  2 mL   BLOOD ADMINISTERED:none  DRAINS: none   LOCAL MEDICATIONS USED:  NONE  SPECIMEN:  No Specimen  DISPOSITION OF SPECIMEN:  N/A  COUNTS:  YES  TOURNIQUET:  * Missing tourniquet times found for documented tourniquets in log: [id number] *  DICTATION: .Dragon Dictation  PLAN OF CARE: Discharge to home after PACU  PATIENT DISPOSITION:  PACU - hemodynamically stable.

## 2019-03-14 NOTE — Op Note (Signed)
NAME: Curtis Andrade. MEDICAL RECORD NO: [id number] DATE OF BIRTH: 1946/02/14 FACILITY: [facility] LOCATION: [facility] PHYSICIAN: Wynonia Sours, MD   OPERATIVE REPORT   DATE OF PROCEDURE: 03/14/19    PREOPERATIVE DIAGNOSIS:   Rupture EPL tendon left hand   POSTOPERATIVE DIAGNOSIS:   Same   PROCEDURE:   EIP to EPL transfer left hand   SURGEON: [name], M.D.   ASSISTANT: Leverne Humbles, [facility]   ANESTHESIA:  Regional with sedation   INTRAVENOUS FLUIDS:  Per anesthesia flow sheet.   ESTIMATED BLOOD LOSS:  Minimal.   COMPLICATIONS:  None.   SPECIMENS:  none   TOURNIQUET TIME:   * Missing tourniquet times found for documented tourniquets in log: [id number] *   DISPOSITION:  Stable to PACU.   INDICATIONS: Patient is a 73 year old male who was working approximately a week ago when he felt a pop in his wrist and was unable to extend his thumb.  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  He is no ability to extend the IP joint of his thumb and has no tenodesis effect.  He is admitted for exploration repair tendon graft or tendon transfer EIP to EPL depending on the condition of the ruptured tendon.  Preperi-and postoperative course been discussed along with risks and complications.  He is aware there is no guarantee to the surgery the possibility of infection recurrence injury to arteries nerves tendons complete relief symptoms dystrophy possibility of rupture of the tendon transfer or graft.  In the preoperative area the patient is seen the extremity marked by both patient and surgeon antibiotic given.  A supraclavicular block was carried out in the preoperative area under the direction of the anesthesia department.  OPERATIVE COURSE: The patient was brought to the operating room placed in the supine position with the left arm free.  He was prepped and draped with ChloraPrep.  A 3-minute dry time was allowed and a timeout taken confirming patient  procedure.  The limb was exsanguinated with an Esmarch bandage turn placed on the arm was inflated to 250 mmHg.  A curvilinear incision was made over the distal radius Lister's tubercle area.  This carried down through subcutaneous tissue.  Bleeders were electrocauterized with bipolar.  Branches of nerves were protected.  The EPL tendon had a very small remnant present.  Measured approximately a millimeter in diameter.  It showed the ability to retropulse the thumb slightly but not extend the distal phalanx.  No tendon was visible proximally or distally.  The tendon distally was palpable.  This was over the midportion of the metacarpal.  Incision was made for EIP transfer this was done at the level of the metacarpal phalangeal joint to the index finger.  The EIP was isolated.  There is moderate amount of adhesions to the extensor digitorum communis.  The these were lysed.  Traction was then placed proximally the EIP transected distally and the stump sutured to the extensor digitorum commonness at the level of the metacarpal phalangeal joint this was done with interrupted horizontal mattress 4-0 Mersilene sutures.  The EIP tendon was then delivered proximally.  It was then rerouted in a subcutaneous manner over to the proximal aspect of the EPL tendon after making a longitudinal incision over the metacarpal of the thumb.  Care was taken to protect neurovascular structures deep.  Bleeders were electrocauterized by as necessary with bipolar.  The tendon was then transferred from the EIP and then woven through  the EPL tendon with a Pulvertaft weave of multiple in nature.  The EPL tendon was then splayed proximally and sutured over the EIP transfer.  The EIP distally and the terminal palmar wound was then sutured over itself the suturing was done at each weave with horizontal mattress 4-0 Mersilene sutures.  Attention was placed with the wrist in a slightly dorsiflexed position and the thumb extended tension was checked  with flexion extension of his wrist.  The thumb was able to be brought over to the fifth metacarpal.  The tension on the EIP was done at 50%.  Wounds were copiously irrigated with saline.  The skin was closed in layers on the proximal wound with 4-0 Vicryl and 4-0 nylon the distal wounds were closed with interrupted 4-0 nylon sutures.  A sterile compressive dressing thumb spica splint was applied.  On deflation of the tourniquet all fingers immediately pink.  He was taken to the recovery room for observation in satisfactory condition.  He will be discharged home to return to [place] in 1 week on Tylenol ibuprofen for pain with tramadol for breakthrough.  [name], MD Electronically signed, 03/14/19

## 2019-03-14 NOTE — Transfer of Care (Signed)
Immediate Anesthesia Transfer of Care Note  Patient: Curtis Andrade.  Procedure(s) Performed: TENDON REPAIR AND GRAFT  TRANSFER EXTENSOR INDICIS PROPRIUS LEFT THUMB (Left Hand)  Patient Location: PACU  Anesthesia Type:MAC combined with regional for post-op pain  Level of Consciousness: awake, alert  and oriented  Airway & Oxygen Therapy: Patient Spontanous Breathing and Patient connected to face mask oxygen  Post-op Assessment: Report given to RN and Post -op Vital signs reviewed and stable  Post vital signs: Reviewed and stable  Last Vitals:  Vitals Value Taken Time  BP 125/77 03/14/19 1250  Temp    Pulse 72 03/14/19 1252  Resp 13 03/14/19 1252  SpO2 94 % 03/14/19 1252  Vitals shown include unvalidated device data.  Last Pain:  Vitals:   03/14/19 1038  TempSrc: Temporal  PainSc: 1          Complications: No apparent anesthesia complications

## 2019-03-14 NOTE — Anesthesia Procedure Notes (Signed)
Anesthesia Regional Block: Axillary brachial plexus block   Pre-Anesthetic Checklist: ,, timeout performed, Correct Patient, Correct Site, Correct Laterality, Correct Procedure, Correct Position, site marked, Risks and benefits discussed,  Surgical consent,  Pre-op evaluation,  At surgeon's request and post-op pain management  Laterality: Left  Prep: chloraprep       Needles:  Injection technique: Single-shot  Needle Type: Echogenic Stimulator Needle     Needle Length: 5cm  Needle Gauge: 22     Additional Needles:   Procedures:, nerve stimulator,,, ultrasound used (permanent image in chart),,,,   Nerve Stimulator or Paresthesia:  Response: hand, 0.45 mA,   Additional Responses:   Narrative:  Start time: 03/14/2019 11:15 AM End time: 03/14/2019 11:20 AM Injection made incrementally with aspirations every 5 mL.  Performed by: Personally  Anesthesiologist: Janeece Riggers, MD  Additional Notes: Functioning IV was confirmed and monitors were applied.  A 62mm 22ga Arrow echogenic stimulator needle was used. Sterile prep and drape,hand hygiene and sterile gloves were used. Ultrasound guidance: relevant anatomy identified, needle position confirmed, local anesthetic spread visualized around nerve(s)., vascular puncture avoided.  Image printed for medical record. Negative aspiration and negative test dose prior to incremental administration of local anesthetic. The patient tolerated the procedure well.

## 2019-03-14 NOTE — Discharge Instructions (Addendum)

## 2019-03-15 ENCOUNTER — Encounter: Payer: Self-pay | Admitting: *Deleted

## 2019-03-15 NOTE — Anesthesia Postprocedure Evaluation (Signed)
Anesthesia Post Note  Patient: Curtis Andrade.  Procedure(s) Performed: TENDON REPAIR AND GRAFT  TRANSFER EXTENSOR INDICIS PROPRIUS LEFT THUMB (Left Hand)     Patient location during evaluation: PACU Anesthesia Type: Regional Level of consciousness: awake and alert Pain management: pain level controlled Vital Signs Assessment: post-procedure vital signs reviewed and stable Respiratory status: spontaneous breathing, nonlabored ventilation, respiratory function stable and patient connected to nasal cannula oxygen Cardiovascular status: stable and blood pressure returned to baseline Postop Assessment: no apparent nausea or vomiting Anesthetic complications: no    Last Vitals:  Vitals:   03/14/19 1300 03/14/19 1327  BP: (!) 139/109 (!) 157/83  Pulse: (!) 37 (!) 58  Resp: 16 16  Temp:  (!) 36.4 C  SpO2: 95% 97%    Last Pain:  Vitals:   03/15/19 1111  TempSrc:   PainSc: 3    Pain Goal:                   Kaniesha Barile

## 2019-03-20 DIAGNOSIS — M79642 Pain in left hand: Secondary | ICD-10-CM | POA: Diagnosis not present

## 2019-03-20 DIAGNOSIS — M79645 Pain in left finger(s): Secondary | ICD-10-CM | POA: Diagnosis not present

## 2019-03-20 DIAGNOSIS — S66812A Strain of other specified muscles, fascia and tendons at wrist and hand level, left hand, initial encounter: Secondary | ICD-10-CM | POA: Diagnosis not present

## 2019-04-03 DIAGNOSIS — S66812A Strain of other specified muscles, fascia and tendons at wrist and hand level, left hand, initial encounter: Secondary | ICD-10-CM | POA: Insufficient documentation

## 2019-04-04 DIAGNOSIS — M79645 Pain in left finger(s): Secondary | ICD-10-CM | POA: Diagnosis not present

## 2019-04-24 DIAGNOSIS — M25642 Stiffness of left hand, not elsewhere classified: Secondary | ICD-10-CM | POA: Diagnosis not present

## 2019-04-24 DIAGNOSIS — M25632 Stiffness of left wrist, not elsewhere classified: Secondary | ICD-10-CM | POA: Diagnosis not present

## 2019-04-24 DIAGNOSIS — S66812D Strain of other specified muscles, fascia and tendons at wrist and hand level, left hand, subsequent encounter: Secondary | ICD-10-CM | POA: Diagnosis not present

## 2019-05-29 DIAGNOSIS — D485 Neoplasm of uncertain behavior of skin: Secondary | ICD-10-CM | POA: Diagnosis not present

## 2019-05-29 DIAGNOSIS — Z85828 Personal history of other malignant neoplasm of skin: Secondary | ICD-10-CM | POA: Diagnosis not present

## 2019-05-29 DIAGNOSIS — D2272 Melanocytic nevi of left lower limb, including hip: Secondary | ICD-10-CM | POA: Diagnosis not present

## 2019-05-29 DIAGNOSIS — L821 Other seborrheic keratosis: Secondary | ICD-10-CM | POA: Diagnosis not present

## 2019-05-29 DIAGNOSIS — D044 Carcinoma in situ of skin of scalp and neck: Secondary | ICD-10-CM | POA: Diagnosis not present

## 2019-05-29 DIAGNOSIS — D2261 Melanocytic nevi of right upper limb, including shoulder: Secondary | ICD-10-CM | POA: Diagnosis not present

## 2019-05-29 DIAGNOSIS — X32XXXA Exposure to sunlight, initial encounter: Secondary | ICD-10-CM | POA: Diagnosis not present

## 2019-05-29 DIAGNOSIS — L570 Actinic keratosis: Secondary | ICD-10-CM | POA: Diagnosis not present

## 2019-05-29 DIAGNOSIS — D2262 Melanocytic nevi of left upper limb, including shoulder: Secondary | ICD-10-CM | POA: Diagnosis not present

## 2019-07-10 DIAGNOSIS — D044 Carcinoma in situ of skin of scalp and neck: Secondary | ICD-10-CM | POA: Diagnosis not present

## 2019-11-07 DIAGNOSIS — I10 Essential (primary) hypertension: Secondary | ICD-10-CM | POA: Diagnosis not present

## 2019-11-07 DIAGNOSIS — Z0000 Encounter for general adult medical examination without abnormal findings: Secondary | ICD-10-CM | POA: Diagnosis not present

## 2019-11-07 DIAGNOSIS — Z125 Encounter for screening for malignant neoplasm of prostate: Secondary | ICD-10-CM | POA: Diagnosis not present

## 2019-11-13 DIAGNOSIS — Z125 Encounter for screening for malignant neoplasm of prostate: Secondary | ICD-10-CM | POA: Diagnosis not present

## 2019-11-13 DIAGNOSIS — Z0000 Encounter for general adult medical examination without abnormal findings: Secondary | ICD-10-CM | POA: Diagnosis not present

## 2019-11-13 DIAGNOSIS — K219 Gastro-esophageal reflux disease without esophagitis: Secondary | ICD-10-CM | POA: Diagnosis not present

## 2019-11-13 DIAGNOSIS — I10 Essential (primary) hypertension: Secondary | ICD-10-CM | POA: Diagnosis not present

## 2019-11-14 DIAGNOSIS — Z85828 Personal history of other malignant neoplasm of skin: Secondary | ICD-10-CM | POA: Diagnosis not present

## 2019-11-14 DIAGNOSIS — D225 Melanocytic nevi of trunk: Secondary | ICD-10-CM | POA: Diagnosis not present

## 2019-11-14 DIAGNOSIS — L570 Actinic keratosis: Secondary | ICD-10-CM | POA: Diagnosis not present

## 2019-11-14 DIAGNOSIS — D044 Carcinoma in situ of skin of scalp and neck: Secondary | ICD-10-CM | POA: Diagnosis not present

## 2019-11-14 DIAGNOSIS — D0462 Carcinoma in situ of skin of left upper limb, including shoulder: Secondary | ICD-10-CM | POA: Diagnosis not present

## 2019-11-14 DIAGNOSIS — D2261 Melanocytic nevi of right upper limb, including shoulder: Secondary | ICD-10-CM | POA: Diagnosis not present

## 2019-11-14 DIAGNOSIS — X32XXXA Exposure to sunlight, initial encounter: Secondary | ICD-10-CM | POA: Diagnosis not present

## 2019-11-14 DIAGNOSIS — D2262 Melanocytic nevi of left upper limb, including shoulder: Secondary | ICD-10-CM | POA: Diagnosis not present

## 2019-11-14 DIAGNOSIS — C44729 Squamous cell carcinoma of skin of left lower limb, including hip: Secondary | ICD-10-CM | POA: Diagnosis not present

## 2019-11-14 DIAGNOSIS — D485 Neoplasm of uncertain behavior of skin: Secondary | ICD-10-CM | POA: Diagnosis not present

## 2019-11-14 DIAGNOSIS — D2272 Melanocytic nevi of left lower limb, including hip: Secondary | ICD-10-CM | POA: Diagnosis not present

## 2019-12-18 DIAGNOSIS — C44729 Squamous cell carcinoma of skin of left lower limb, including hip: Secondary | ICD-10-CM | POA: Diagnosis not present

## 2020-01-01 DIAGNOSIS — D044 Carcinoma in situ of skin of scalp and neck: Secondary | ICD-10-CM | POA: Diagnosis not present

## 2020-01-08 DIAGNOSIS — D0462 Carcinoma in situ of skin of left upper limb, including shoulder: Secondary | ICD-10-CM | POA: Diagnosis not present

## 2020-02-09 ENCOUNTER — Other Ambulatory Visit: Payer: Self-pay

## 2020-02-09 ENCOUNTER — Encounter: Payer: Self-pay | Admitting: Internal Medicine

## 2020-02-09 ENCOUNTER — Ambulatory Visit
Admission: RE | Admit: 2020-02-09 | Discharge: 2020-02-09 | Disposition: A | Discharge status: Home / Self Care | Payer: PPO | Source: Ambulatory Visit | Attending: Internal Medicine | Admitting: Internal Medicine

## 2020-02-09 DIAGNOSIS — I700 Atherosclerosis of aorta: Secondary | ICD-10-CM | POA: Diagnosis not present

## 2020-02-09 DIAGNOSIS — C3411 Malignant neoplasm of upper lobe, right bronchus or lung: Secondary | ICD-10-CM | POA: Insufficient documentation

## 2020-02-09 DIAGNOSIS — I2510 Atherosclerotic heart disease of native coronary artery without angina pectoris: Secondary | ICD-10-CM | POA: Diagnosis not present

## 2020-02-09 DIAGNOSIS — J432 Centrilobular emphysema: Secondary | ICD-10-CM | POA: Diagnosis not present

## 2020-02-09 DIAGNOSIS — C3490 Malignant neoplasm of unspecified part of unspecified bronchus or lung: Secondary | ICD-10-CM | POA: Diagnosis not present

## 2020-02-09 LAB — POCT I-STAT CREATININE: Creatinine, Ser: 1.1 mg/dL (ref 0.61–1.24)

## 2020-02-09 IMAGING — CT CT CHEST W/ CM
2 of 4 series · 15 of 36 positions shown, 18 images · IV contrast (omnipaque)
Comparison: [DATE]

CLINICAL DATA: Restaging lung cancer. Status post right upper lobe
wedge resection.

EXAM:
CT CHEST WITH CONTRAST
TECHNIQUE: Multidetector CT imaging of the chest was performed during
intravenous contrast administration.
CONTRAST:  75mL OMNIPAQUE IOHEXOL 300 MG/ML  SOLN

[Series 2: axial chest 2.00 · axial · 0.74mm/px · z∈[-1221,-913]mm · 12 of 183 slices shown, 15 images]
[im 15/183  mediastinal]
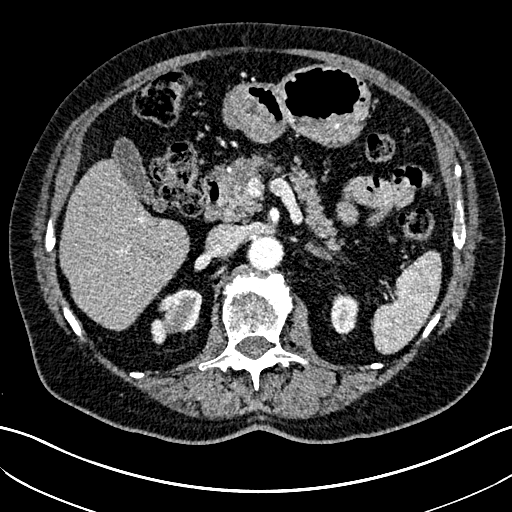
[im 15/183  lung]
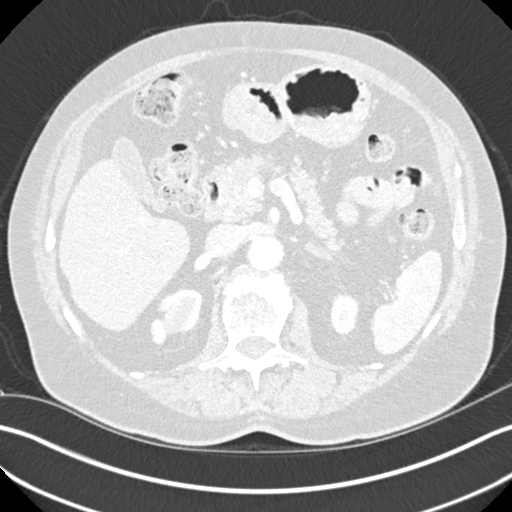
[im 29/183  lung]
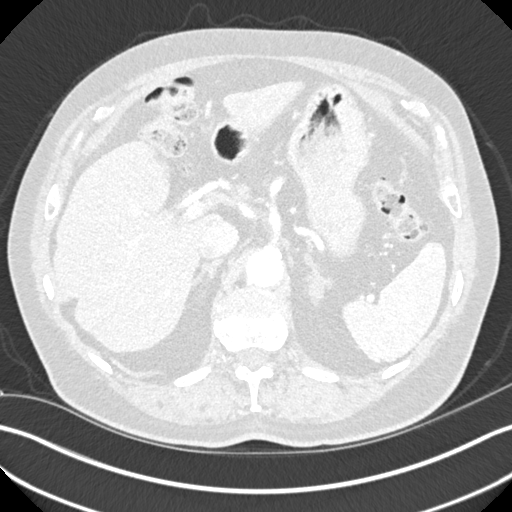
[im 43/183  lung]
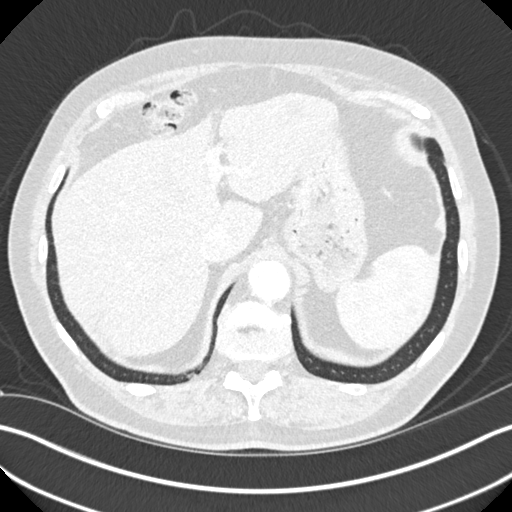
[im 57/183  lung]
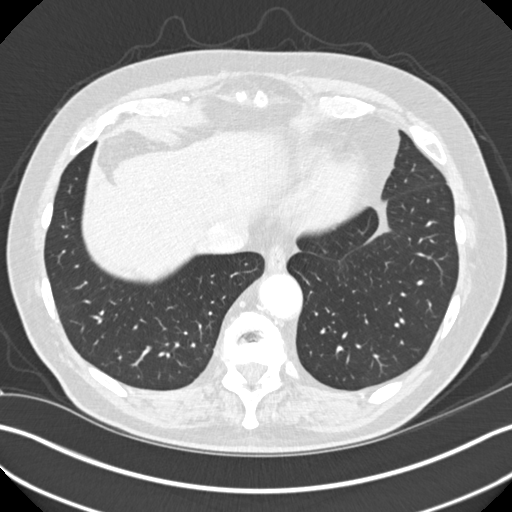
[im 71/183  mediastinal]
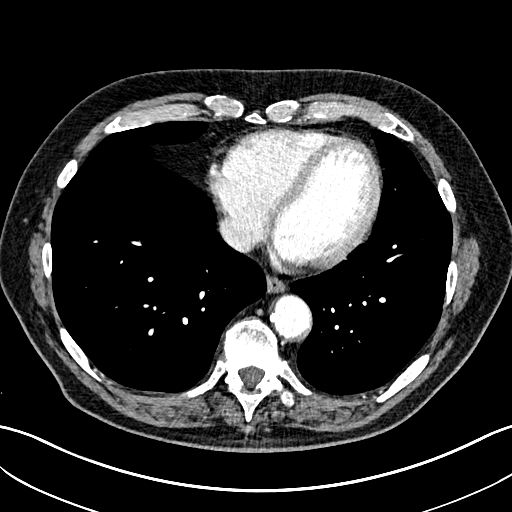
[im 71/183  lung]
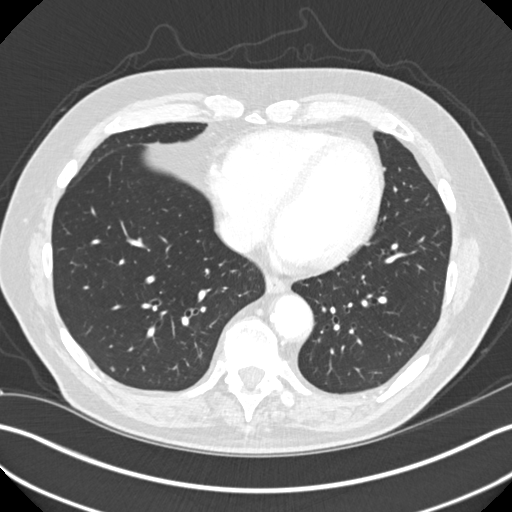
[im 85/183  lung]
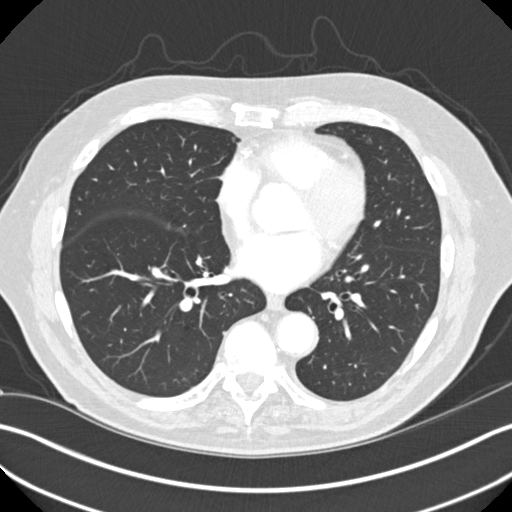
[im 99/183  lung]
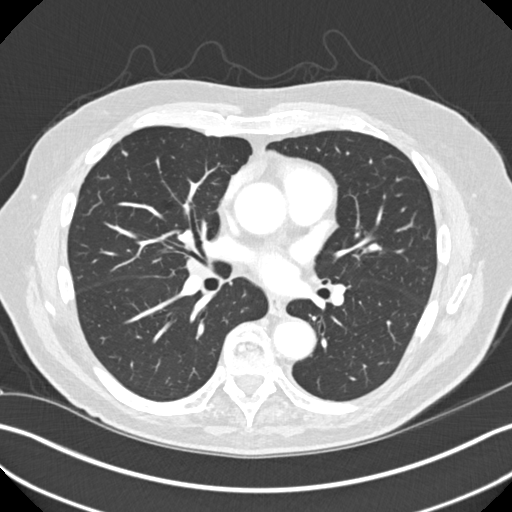
[im 113/183  lung]
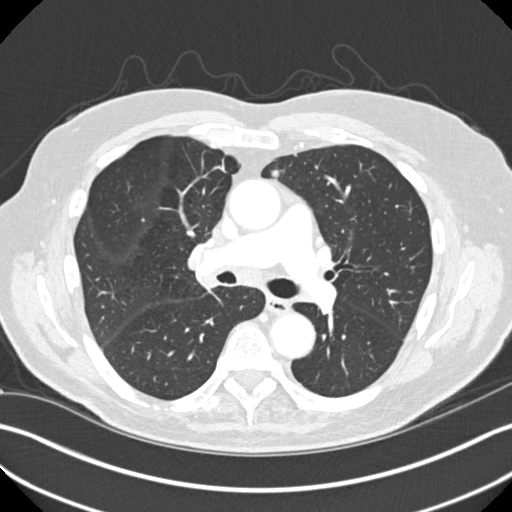
[im 127/183  mediastinal]
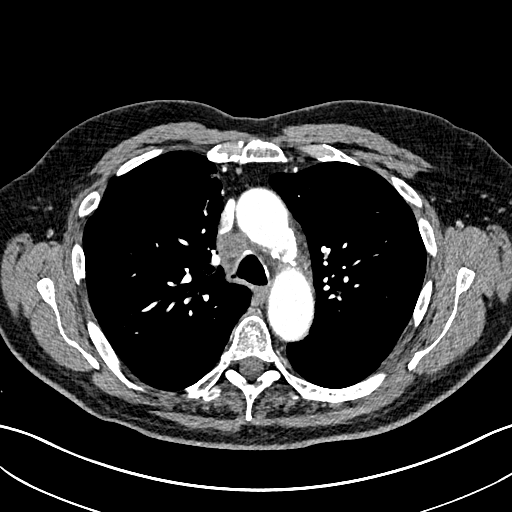
[im 127/183  lung]
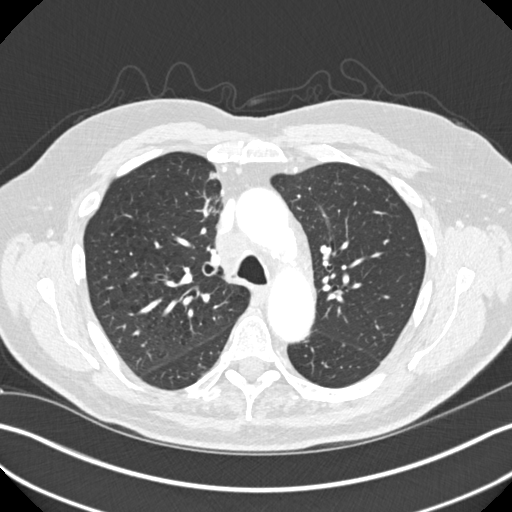
[im 141/183  lung]
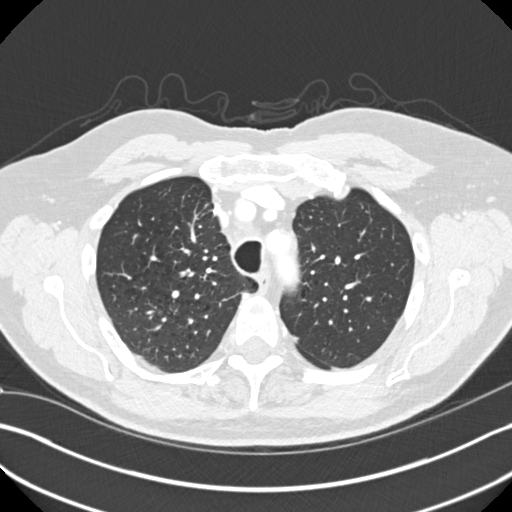
[im 155/183  lung]
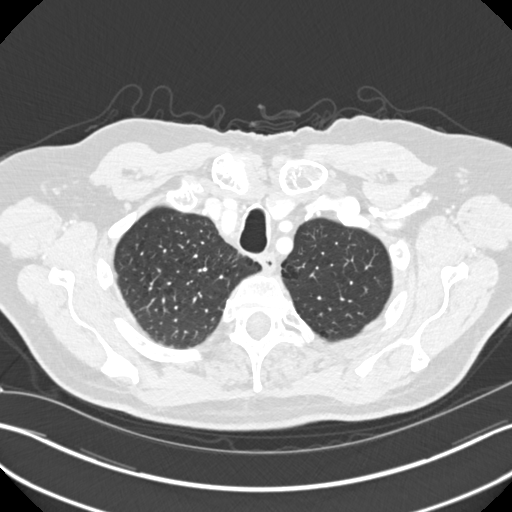
[im 169/183  lung]
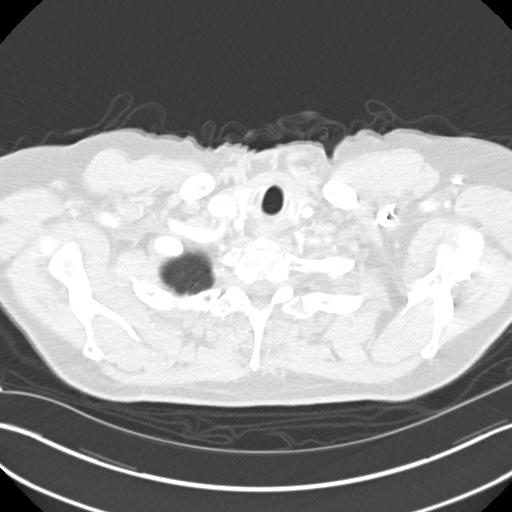

[Series 4: coronal chest 2.00 cor · coronal · 0.72mm/px · 3 of 145 slices shown]
[im 29/145  lung]
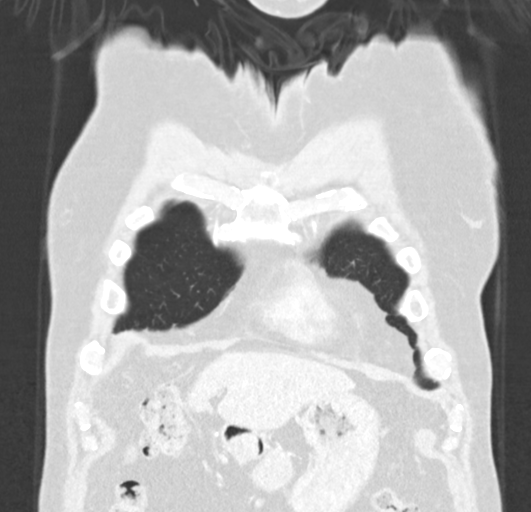
[im 58/145  lung]
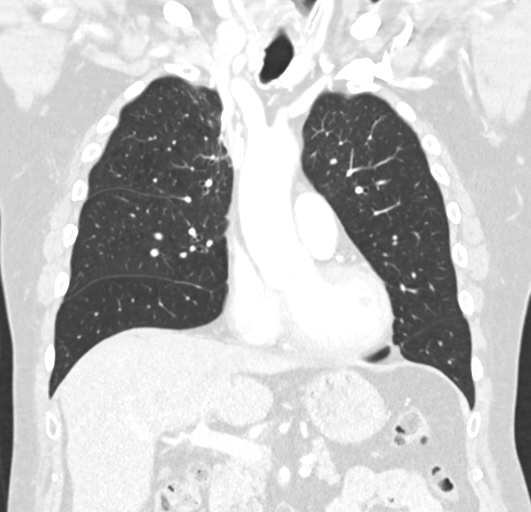
[im 87/145  lung]
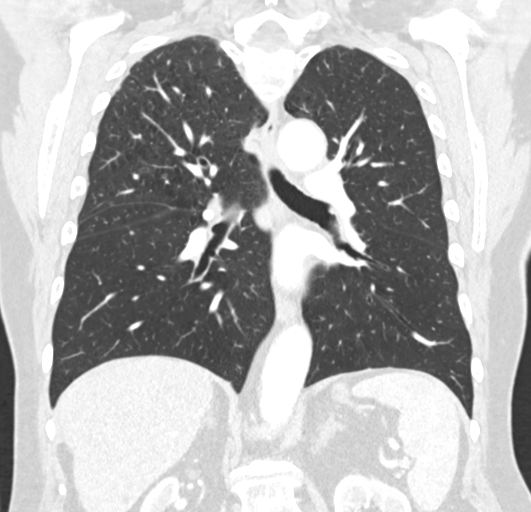

[15 of 36 positions shown; findings below may reference images not displayed]

FINDINGS: Cardiovascular: Heart size appears normal. No pericardial effusion.
Aortic atherosclerosis. Coronary artery calcifications.

Mediastinum/Nodes: Normal appearance of the thyroid gland. The
trachea appears patent and is midline. Normal appearance of the
esophagus.

Interval development of mediastinal and right hilar adenopathy: New
right paratracheal lymph node measures 2 cm, image 55/2. High right
paratracheal lymph node measures 1 cm, image 35/2. Previously
cm. Subcarinal lymph node measures 1.3 cm, image 71/2. Previously
1.2 cm. 1.6 cm right hilar lymph node is noted, image 67/2.
Previously 1.1 cm. Right inferior hilar lymph node measures 1.1 cm,
image 87/2. Previously 0.4 cm.

Lungs/Pleura: Centrilobular and paraseptal emphysema.

Multiple scattered small pulmonary nodules are noted in both lungs:

Along the suture line within the paramediastinal right upper lobe
there is a new nodule measuring 6 mm, image 50/3. Adjacent
subpleural nodule measures 7 mm, image 56/3. Also new from previous
exam.

Within the anteromedial left upper lobe subpleural nodule measures 8
mm, image 72/3. New from previous exam.

Also within the anteromedial left upper lobe is a new 6 mm
subpleural nodule, image 76/3.

Posterior basal right upper lobe nodule measures 5 mm, image 74/3.
New from previous exam.

Right middle lobe lung nodule measures 4 mm, image 73/3. New from
previous exam.

Tiny right lung nodule near the base measures 3 mm and appears new
from the previous exam, image 114/3.

Posterior left base nodule measures 3 mm, image 137/3. New from
previous study.

Upper Abdomen: Unchanged small cyst within anterior right lobe of
liver measuring 1.1 cm. Gallstones are again noted within the
gallbladder. Stable low-attenuation enlargement of the left adrenal
gland which is favored to represent adenomatous hyperplasia.
Scarring is noted involving the upper pole of right kidney.

Musculoskeletal: No chest wall abnormality. No acute or significant
osseous findings.
IMPRESSION: 1. Interval development of bilateral pulmonary nodules worrisome for
metastatic disease.
2. Interval increase in size of right hilar and mediastinal lymph
nodes worrisome for metastatic adenopathy.
3. Coronary artery calcifications noted.
4. Gallstones.

Aortic Atherosclerosis ([2W]-[2W]) and Emphysema ([2W]-[2W]).

## 2020-02-09 MED ORDER — IOHEXOL 300 MG/ML  SOLN
75.0000 mL | Freq: Once | INTRAMUSCULAR | Status: AC | PRN
  Administered 2020-02-09: 75 mL via INTRAVENOUS

## 2020-02-09 NOTE — Progress Notes (Signed)
Patient called for pre assessment. He denies any pain or concerns at this time.

## 2020-02-12 ENCOUNTER — Inpatient Hospital Stay: Payer: PPO | Attending: Internal Medicine

## 2020-02-12 ENCOUNTER — Other Ambulatory Visit: Payer: Self-pay

## 2020-02-12 ENCOUNTER — Inpatient Hospital Stay (HOSPITAL_BASED_OUTPATIENT_CLINIC_OR_DEPARTMENT_OTHER): Payer: PPO | Admitting: Internal Medicine

## 2020-02-12 VITALS — BP 184/85 | HR 73 | Temp 97.3°F | Resp 16 | Ht 71.0 in | Wt 213.8 lb

## 2020-02-12 DIAGNOSIS — Z87891 Personal history of nicotine dependence: Secondary | ICD-10-CM | POA: Diagnosis not present

## 2020-02-12 DIAGNOSIS — C7800 Secondary malignant neoplasm of unspecified lung: Secondary | ICD-10-CM | POA: Insufficient documentation

## 2020-02-12 DIAGNOSIS — C7951 Secondary malignant neoplasm of bone: Secondary | ICD-10-CM | POA: Diagnosis not present

## 2020-02-12 DIAGNOSIS — I499 Cardiac arrhythmia, unspecified: Secondary | ICD-10-CM | POA: Diagnosis not present

## 2020-02-12 DIAGNOSIS — Z7982 Long term (current) use of aspirin: Secondary | ICD-10-CM | POA: Insufficient documentation

## 2020-02-12 DIAGNOSIS — C787 Secondary malignant neoplasm of liver and intrahepatic bile duct: Secondary | ICD-10-CM | POA: Diagnosis not present

## 2020-02-12 DIAGNOSIS — C3411 Malignant neoplasm of upper lobe, right bronchus or lung: Secondary | ICD-10-CM

## 2020-02-12 DIAGNOSIS — Z79899 Other long term (current) drug therapy: Secondary | ICD-10-CM | POA: Diagnosis not present

## 2020-02-12 DIAGNOSIS — C7989 Secondary malignant neoplasm of other specified sites: Secondary | ICD-10-CM | POA: Insufficient documentation

## 2020-02-12 DIAGNOSIS — C771 Secondary and unspecified malignant neoplasm of intrathoracic lymph nodes: Secondary | ICD-10-CM | POA: Diagnosis not present

## 2020-02-12 DIAGNOSIS — Z8521 Personal history of malignant neoplasm of larynx: Secondary | ICD-10-CM | POA: Insufficient documentation

## 2020-02-12 LAB — CBC WITH DIFFERENTIAL/PLATELET
Abs Immature Granulocytes: 0.02 10*3/uL (ref 0.00–0.07)
Basophils Absolute: 0.1 10*3/uL (ref 0.0–0.1)
Basophils Relative: 1 %
Eosinophils Absolute: 0.3 10*3/uL (ref 0.0–0.5)
Eosinophils Relative: 3 %
HCT: 44.5 % (ref 39.0–52.0)
Hemoglobin: 14.8 g/dL (ref 13.0–17.0)
Immature Granulocytes: 0 %
Lymphocytes Relative: 25 %
Lymphs Abs: 2 10*3/uL (ref 0.7–4.0)
MCH: 31.6 pg (ref 26.0–34.0)
MCHC: 33.3 g/dL (ref 30.0–36.0)
MCV: 94.9 fL (ref 80.0–100.0)
Monocytes Absolute: 0.7 10*3/uL (ref 0.1–1.0)
Monocytes Relative: 8 %
Neutro Abs: 5 10*3/uL (ref 1.7–7.7)
Neutrophils Relative %: 63 %
Platelets: 211 10*3/uL (ref 150–400)
RBC: 4.69 MIL/uL (ref 4.22–5.81)
RDW: 14.1 % (ref 11.5–15.5)
WBC: 8 10*3/uL (ref 4.0–10.5)
nRBC: 0 % (ref 0.0–0.2)

## 2020-02-12 LAB — COMPREHENSIVE METABOLIC PANEL
ALT: 24 U/L (ref 0–44)
AST: 28 U/L (ref 15–41)
Albumin: 4 g/dL (ref 3.5–5.0)
Alkaline Phosphatase: 51 U/L (ref 38–126)
Anion gap: 7 (ref 5–15)
BUN: 15 mg/dL (ref 8–23)
CO2: 27 mmol/L (ref 22–32)
Calcium: 8.9 mg/dL (ref 8.9–10.3)
Chloride: 101 mmol/L (ref 98–111)
Creatinine, Ser: 0.95 mg/dL (ref 0.61–1.24)
GFR, Estimated: >60 mL/min (ref >60)
Glucose, Bld: 104 mg/dL — ABNORMAL HIGH (ref 70–99)
Potassium: 4.6 mmol/L (ref 3.5–5.1)
Sodium: 135 mmol/L (ref 135–145)
Total Bilirubin: 0.7 mg/dL (ref 0.3–1.2)
Total Protein: 7.7 g/dL (ref 6.5–8.1)

## 2020-02-12 NOTE — Progress Notes (Signed)
[place] NOTE  Patient Care Team: [name], MD as PCP - General (Family Medicine) [name], MD as Consulting Physician (Family Medicine) [name], [name], MD (General Surgery)  CHIEF COMPLAINTS/PURPOSE OF CONSULTATION:  Lung cancer  #  Oncology History Overview Note  # OCT 2018- STAGE I- A. 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  - PARENCHYMAL MARGIN APPEARS CLEAR; TUMOR WAS 1 CM FROM MARGIN BEFORE REMOVAL OF STAPLE LINE.; NO adjuvant therapy.   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  Short course of Eliquis.smoking-quit 2008.  ---------------------------------------    DIAGNOSIS: LUNG CA  STAGE:  I       ;GOALS: cure  CURRENT/MOST RECENT THERAPY: surveillaince    Primary cancer of right upper lobe of lung (HCC)     HISTORY OF PRESENTING ILLNESS:  Curtis Andrade. 74 y.o.  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  Patient denies any new lumps or bumps.  Appetite is good.  No weight loss.  Hoarseness of voice.  No shortness of breath or cough.  No bone pain.  Denies any headaches.  Review of Systems  Constitutional: Negative for chills, diaphoresis, fever, malaise/fatigue and weight loss.  HENT: Negative for nosebleeds and sore throat.   Eyes: Negative for double vision.  Respiratory: Negative for cough, hemoptysis, sputum production, shortness of breath and wheezing.   Cardiovascular: Negative for chest pain, palpitations, orthopnea and leg swelling.  Gastrointestinal: Negative for abdominal pain, blood in stool, constipation, diarrhea, heartburn, melena, nausea and vomiting.  Genitourinary: Negative for dysuria, frequency and urgency.   Musculoskeletal: Negative for back pain and joint pain.  Skin: Negative.  Negative for itching and rash.  Neurological: Negative for dizziness, tingling, focal weakness, weakness and headaches.  Endo/Heme/Allergies: Does not bruise/bleed easily.  Psychiatric/Behavioral: Negative for depression. The patient is not nervous/anxious and does not have insomnia.      MEDICAL HISTORY:  Past Medical History:  Diagnosis Date  . 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 Colon polyp 2013  . Hemorrhoids    pt denies  . 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 Baruch Gouty and Dr. Nadeen Landau  . Phlebitis 1998  . Primary cancer of right upper lobe of lung ([place]) 01/14/2017   Dr. Genevive Bi performed a wedge resection of RUL.   . 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/27/2016    SURGICAL HISTORY: Past Surgical History:  Procedure Laterality Date  . COLONOSCOPY  2013   Dr Vira Agar  . COLONOSCOPY WITH PROPOFOL N/A 12/23/2016   Procedure: COLONOSCOPY WITH PROPOFOL;  Surgeon: Robert Bellow, MD;  Location: [facility];  Service: Endoscopy;  Laterality: N/A;  . EYE SURGERY    . HERNIA REPAIR Right 1990's  . TENDON REPAIR Left 03/14/2019   Procedure: TENDON REPAIR AND GRAFT  TRANSFER EXTENSOR INDICIS PROPRIUS LEFT THUMB;  Surgeon: [name], MD;  Location: [place];  Service: Orthopedics;  Laterality: Left;  AXILLARY BLOCK  . THORACOTOMY Right 01/14/2017   Procedure: RIGHT THORACOSCOPYWITH WIDE WEDGE RESECTION,  PREOP BROCHOSCOPY;  Surgeon: [name], MD;  Location: [facility] OR[facility];  Service: General;  Laterality: Right;  . THROAT SURGERY  2010    SOCIAL HISTORY: Social History   Socioeconomic History  . Marital status: Married    Spouse name: Not on file  . Number of children: Not on file  . Years of education: Not on file  . Highest education level: Not on file  Occupational History  .  Not on file  Tobacco Use  . Smoking status: Former Smoker    Years: 50.00    Quit date: 03/31/2007    Years since quitting: 12.8  . Smokeless tobacco: Never Used  Vaping Use  . Vaping Use: Never used  Substance and Sexual Activity  . Alcohol use: No  . Drug use: No  . Sexual activity: Not on file  Other Topics Concern  . Not on file  Social History Narrative  . Not on file   Social Determinants of Health   Financial Resource Strain:   . Difficulty of Paying Living Expenses: Not on file  Food Insecurity:   . Worried About Charity fundraiser in the Last Year: Not on file  . Ran Out of Food in the Last Year: Not on file  Transportation Needs:   . Lack of Transportation (Medical): Not on file  . Lack of Transportation (Non-Medical): Not on file  Physical Activity:   . Days of Exercise per Week: Not on file  . Minutes of Exercise per Session: Not on file  Stress:   . Feeling of Stress : Not on file  Social Connections:   . Frequency of Communication with Friends and Family: Not on file  . Frequency of Social Gatherings with Friends and Family: Not on file  . Attends Religious Services: Not on file  . Active Member of Clubs or Organizations: Not on file  . Attends Archivist Meetings: Not on file  . Marital Status: Not on file  Intimate Partner Violence:   . Fear of Current or Ex-Partner: Not on file  . Emotionally Abused: Not on file  . Physically Abused: Not on file  . Sexually Abused: Not on file    FAMILY HISTORY: Family History  Problem Relation Age of Onset  . Alzheimer's disease Mother   . Alzheimer's disease Father   . Stroke Father   . Colon cancer Neg Hx     ALLERGIES:  has No Known Allergies.  MEDICATIONS:  Current Outpatient Medications  Medication Sig Dispense Refill  . Ascorbic Acid (VITAMIN C) 1000 MG tablet Take 1,000 mg by mouth daily.    Marland Kitchen aspirin EC 81 MG tablet Take 81 mg by mouth daily.    . folic acid (FOLVITE) 992 MCG tablet  Take 800 mcg by mouth daily.    . Glucosamine-Chondroitin (COSAMIN DS PO) Take 1 tablet by mouth daily.    . Multiple Vitamin (MULTIVITAMIN WITH MINERALS) TABS tablet Take 1 tablet by mouth daily. Senior Multivitamin.    . Omega-3 1000 MG CAPS Take 1 tablet by mouth 1 day or 1 dose.    Marland Kitchen omeprazole (PRILOSEC) 40 MG capsule Take 40 mg by mouth daily before supper.     . traMADol (ULTRAM) 50 MG tablet Take 1 tablet (50 mg total) by mouth every 6 (six) hours as needed. 20 tablet 0  . vitamin E 400 UNIT capsule Take 1 capsule by mouth 1 day or 1 dose.     No current facility-administered medications for this visit.      Marland Kitchen  PHYSICAL EXAMINATION: ECOG PERFORMANCE STATUS: 0 - Asymptomatic  Vitals:   02/12/20  1042  BP: (!) 184/85  Pulse: 73  Resp: 16  Temp: (!) 97.3 F (36.3 C)  SpO2: 99%   Filed Weights   02/12/20 1042  Weight: 213 lb 12.8 oz (97 kg)    Physical Exam HENT:     Head: Normocephalic and atraumatic.     Mouth/Throat:     Pharynx: No oropharyngeal exudate.  Eyes:     Pupils: Pupils are equal, round, and reactive to light.  Cardiovascular:     Rate and Rhythm: Normal rate and regular rhythm.  Pulmonary:     Effort: No respiratory distress.     Breath sounds: No wheezing.  Abdominal:     General: Bowel sounds are normal. There is no distension.     Palpations: Abdomen is soft. There is no mass.     Tenderness: There is no abdominal tenderness. There is no guarding or rebound.  Musculoskeletal:        General: No tenderness. Normal range of motion.     Cervical back: Normal range of motion and neck supple.  Skin:    General: Skin is warm.  Neurological:     Mental Status: He is alert and oriented to person, place, and time.  Psychiatric:        Mood and Affect: Affect normal.   ;  LABORATORY DATA:  I have reviewed the data as listed Lab Results  Component Value Date   WBC 8.0 02/12/2020   HGB 14.8 02/12/2020   HCT 44.5 02/12/2020   MCV 94.9  02/12/2020   PLT 211 02/12/2020   Recent Labs    02/09/20 1011 02/12/20 0941  NA  --  135  K  --  4.6  CL  --  101  CO2  --  27  GLUCOSE  --  104*  BUN  --  15  CREATININE 1.10 0.95  CALCIUM  --  8.9  GFRNONAA  --  >60  PROT  --  7.7  ALBUMIN  --  4.0  AST  --  28  ALT  --  24  ALKPHOS  --  51  BILITOT  --  0.7    RADIOGRAPHIC STUDIES: I have personally reviewed the radiological images as listed and agreed with the findings in the report. CT Chest W Contrast  Result Date: 02/09/2020 CLINICAL DATA:  Restaging lung cancer. Status post right upper lobe wedge resection. EXAM: CT CHEST WITH CONTRAST TECHNIQUE: Multidetector CT imaging of the chest was performed during intravenous contrast administration. CONTRAST:  99mL OMNIPAQUE IOHEXOL 300 MG/ML  SOLN COMPARISON:  02/08/2019 FINDINGS: Cardiovascular: Heart size appears normal. No pericardial effusion. Aortic atherosclerosis. Coronary artery calcifications. Mediastinum/Nodes: Normal appearance of the thyroid gland. The trachea appears patent and is midline. Normal appearance of the esophagus. Interval development of mediastinal and right hilar adenopathy: New right paratracheal lymph node measures 2 cm, image 55/2. High right paratracheal lymph node measures 1 cm, image 35/2. Previously 0.5 cm. Subcarinal lymph node measures 1.3 cm, image 71/2. Previously 1.2 cm. 1.6 cm right hilar lymph node is noted, image 67/2. Previously 1.1 cm. Right inferior hilar lymph node measures 1.1 cm, image 87/2. Previously 0.4 cm. Lungs/Pleura: Centrilobular and paraseptal emphysema. Multiple scattered small pulmonary nodules are noted in both lungs: Along the suture line within the paramediastinal right upper lobe there is a new nodule measuring 6 mm, image 50/3. Adjacent subpleural nodule measures 7 mm, image 56/3. Also new from previous exam. Within the anteromedial left upper lobe subpleural nodule measures  8 mm, image 72/3. New from previous exam. Also  within the anteromedial left upper lobe is a new 6 mm subpleural nodule, image 76/3. Posterior basal right upper lobe nodule measures 5 mm, image 74/3. New from previous exam. Right middle lobe lung nodule measures 4 mm, image 73/3. New from previous exam. Tiny right lung nodule near the base measures 3 mm and appears new from the previous exam, image 114/3. Posterior left base nodule measures 3 mm, image 137/3. New from previous study. Upper Abdomen: Unchanged small cyst within anterior right lobe of liver measuring 1.1 cm. Gallstones are again noted within the gallbladder. Stable low-attenuation enlargement of the left adrenal gland which is favored to represent adenomatous hyperplasia. Scarring is noted involving the upper pole of right kidney. Musculoskeletal: No chest wall abnormality. No acute or significant osseous findings. IMPRESSION: 1. Interval development of bilateral pulmonary nodules worrisome for metastatic disease. 2. Interval increase in size of right hilar and mediastinal lymph nodes worrisome for metastatic adenopathy. 3. Coronary artery calcifications noted. 4. Gallstones. Aortic Atherosclerosis (ICD10-I70.0) and Emphysema (ICD10-J43.9). Electronically Signed   By: Kerby Moors M.D.   On: 02/09/2020 14:01    ASSESSMENT & PLAN:   Primary cancer of right upper lobe of lung ([place]) #Stage I adenocarcinoma of the lung; status post wedge resection/however clear margins.  NOV 10th 2021- CT- Interval development of bilateral pulmonary nodules worrisome for  metastatic disease; Interval increase in size of right hilar and mediastinal lymph nodes worrisome for metastatic adenopathy.  Recommend a PET scan for further evaluation.  #Discussed with the patient the significant concerns of possible recurrence [adeno lung versus prior laryngeal malignancy] versus others.  Await the PET scan.  Discussed that I would recommend a biopsy of the mediastinal lymph node if possible.  We'll make a referral to  Dr.Aleskerov.  We'll also review at the tumor conference.  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  # Hypertension- [white TDDU]202R- at home in 150s; monitor closely.   # DISPOSITION: will call with results- [phone] [facility] needed;clinic visit] # PET ASAP # referral to [facility] # follow up in 3 weeks- MD; No labs- Dr.B  # I reviewed the blood work- with the patient in detail; also reviewed the imaging independently [as summarized above]; and with the patient in detail.    Cc: Dr.Hedrrick.    All questions were answered. The patient knows to call the clinic with any problems, questions or concerns.    Cammie Sickle, MD 02/12/2020 8:53 PM

## 2020-02-12 NOTE — Assessment & Plan Note (Addendum)
#  Stage I adenocarcinoma of the lung; status post wedge resection/however clear margins.  NOV 10th 2021- CT- Interval development of bilateral pulmonary nodules worrisome for  metastatic disease; Interval increase in size of right hilar and mediastinal lymph nodes worrisome for metastatic adenopathy.  Recommend a PET scan for further evaluation.  #Discussed with the patient the significant concerns of possible recurrence [adeno lung versus prior laryngeal malignancy] versus others.  Await the PET scan.  Discussed that I would recommend a biopsy of the mediastinal lymph node if possible.  We'll make a referral to Dr.Aleskerov.  We'll also review at the tumor conference.  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  # Hypertension- [white LYYT]035W- at home in 150s; monitor closely.   # DISPOSITION: will call with results- [phone] [facility] needed;clinic visit] # PET ASAP # referral to [facility] # follow up in 3 weeks- MD; No labs- Dr.B  # I reviewed the blood work- with the patient in detail; also reviewed the imaging independently [as summarized above]; and with the patient in detail.    Cc: Dr.Hedrrick.

## 2020-02-15 ENCOUNTER — Other Ambulatory Visit: Payer: PPO

## 2020-02-16 NOTE — Progress Notes (Signed)
Tumor Board Documentation  Curtis Andrade. was presented by Dr Rogue Bussing at our Tumor Board on 02/15/2020, which included representatives from medical oncology, radiation oncology, surgical oncology, surgical, radiology, pathology, navigation, internal medicine, palliative care, research, genetics, pharmacy, pulmonology.  Curtis Andrade currently presents as a current patient, for discussion with history of the following treatments: active survellience, adjuvant radiation, surgical intervention(s).  Additionally, we reviewed previous medical and familial history, history of present illness, and recent lab results along with all available histopathologic and imaging studies. The tumor board considered available treatment options and made the following recommendations: Additional screening (PET Scan) Refer to Dr Sanjuana Mae (Pulmonology)  The following procedures/referrals were also placed: No orders of the defined types were placed in this encounter.   Clinical Trial Status: not discussed   Staging used: AJCC Stage Group  AJCC Staging:       Group: Stage I Lung Cancer   National site-specific guidelines NCCN were discussed with respect to the case.  Tumor board is a meeting of clinicians from various specialty areas who evaluate and discuss patients for whom a multidisciplinary approach is being considered. Final determinations in the plan of care are those of the provider(s). The responsibility for follow up of recommendations given during tumor board is that of the provider.   Todays extended care, comprehensive team conference, Curtis Andrade was not present for the discussion and was not examined.   Multidisciplinary Tumor Board is a multidisciplinary case peer review process.  Decisions discussed in the Multidisciplinary Tumor Board reflect the opinions of the specialists present at the conference without having examined the patient.  Ultimately, treatment and diagnostic decisions rest with the  primary provider(s) and the patient.

## 2020-02-18 ENCOUNTER — Telehealth: Payer: Self-pay | Admitting: Internal Medicine

## 2020-02-18 NOTE — Telephone Encounter (Signed)
On 11/19-spoke to patient regarding discussion at tumor conference concerning for malignancy.  Await PET scan next week as planned.  Recommend evaluation with Dr.Aleskerov; as previously informed Dr.Aleskerov.  Recommend patient call to make an appointment with pulmonary ASAP.   FYI- Dr.Aleskerov.

## 2020-02-19 NOTE — Telephone Encounter (Signed)
Colette- Please call [facility] to check on the status of this referral, which was faxed last week.

## 2020-02-20 ENCOUNTER — Ambulatory Visit
Admission: RE | Admit: 2020-02-20 | Discharge: 2020-02-20 | Disposition: A | Discharge status: Home / Self Care | Payer: PPO | Source: Ambulatory Visit | Attending: Internal Medicine | Admitting: Internal Medicine

## 2020-02-20 ENCOUNTER — Other Ambulatory Visit: Payer: Self-pay

## 2020-02-20 DIAGNOSIS — C3490 Malignant neoplasm of unspecified part of unspecified bronchus or lung: Secondary | ICD-10-CM | POA: Diagnosis not present

## 2020-02-20 DIAGNOSIS — I2510 Atherosclerotic heart disease of native coronary artery without angina pectoris: Secondary | ICD-10-CM | POA: Diagnosis not present

## 2020-02-20 DIAGNOSIS — C7951 Secondary malignant neoplasm of bone: Secondary | ICD-10-CM | POA: Diagnosis not present

## 2020-02-20 DIAGNOSIS — C3411 Malignant neoplasm of upper lobe, right bronchus or lung: Secondary | ICD-10-CM | POA: Insufficient documentation

## 2020-02-20 DIAGNOSIS — C787 Secondary malignant neoplasm of liver and intrahepatic bile duct: Secondary | ICD-10-CM | POA: Diagnosis not present

## 2020-02-20 DIAGNOSIS — E278 Other specified disorders of adrenal gland: Secondary | ICD-10-CM | POA: Diagnosis not present

## 2020-02-20 LAB — GLUCOSE, CAPILLARY: Glucose-Capillary: 98 mg/dL (ref 70–99)

## 2020-02-20 IMAGING — CT NM PET TUM IMG RESTAG (PS) SKULL BASE T - THIGH
10 series · 15 of 25 positions shown · non-contrast
Comparison: Multiple prior chest CTs and a prior PET-CT from
[DATE]. The most recent chest CT is [DATE]

CLINICAL DATA: Subsequent treatment strategy for non-small cell
lung cancer. History of right upper lobe wedge resection. Interval
development of bilateral pulmonary nodules and adenopathy.

EXAM:
NUCLEAR MEDICINE PET SKULL BASE TO THIGH
TECHNIQUE: 11.359 mCi F-18 FDG was injected intravenously. Full-ring PET
imaging was performed from the skull base to thigh after the
radiotracer. CT data was obtained and used for attenuation
correction and anatomic localization.
Fasting blood glucose: 90 mg/dl

[Series 3: ct wb 5.0 b30f · axial · 5.0mm · 0.98mm/px · z∈[-310,+674]mm · 2 of 329 slices shown]
[im 1/329]
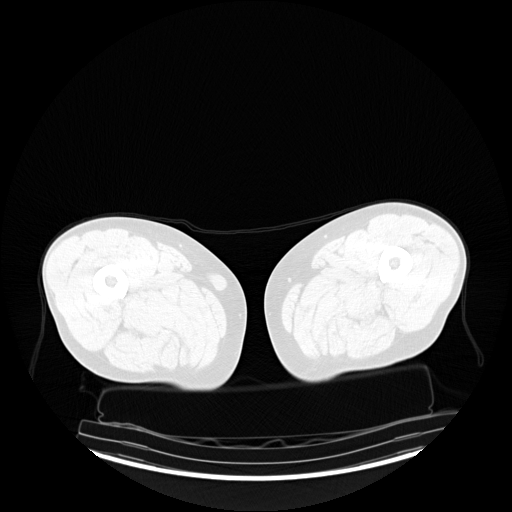
[im 329/329  brain]
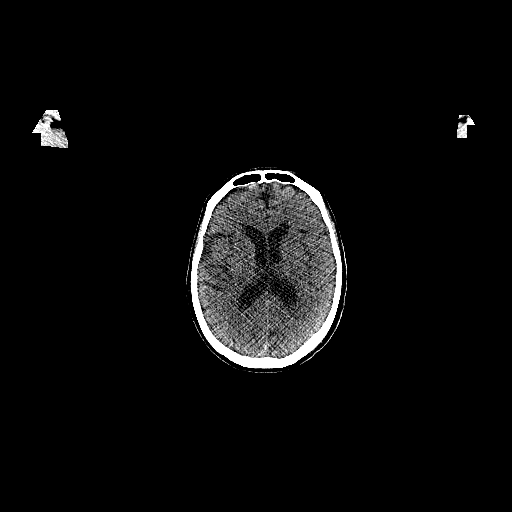

[Series 5: pet wb uncorrected (nac) · axial · 5.0mm · 4.07mm/px · z∈[+182,+674]mm · 2 of 329 slices shown]
[im 165/329]
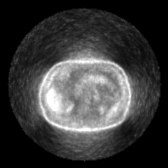
[im 329/329]
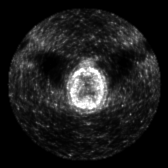

[Series 6: pet wb (ac) · axial · 5.0mm · 3.13mm/px · z∈[+16,+674]mm · 2 of 329 slices shown]
[im 110/329]
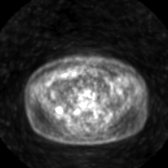
[im 329/329]
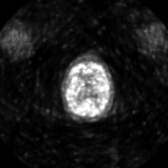

[Series 603: fused axial · 2 of 327 slices shown]
[im 1/327]
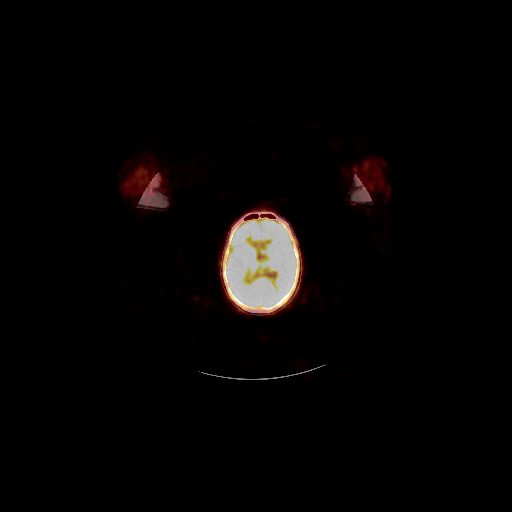
[im 218/327]
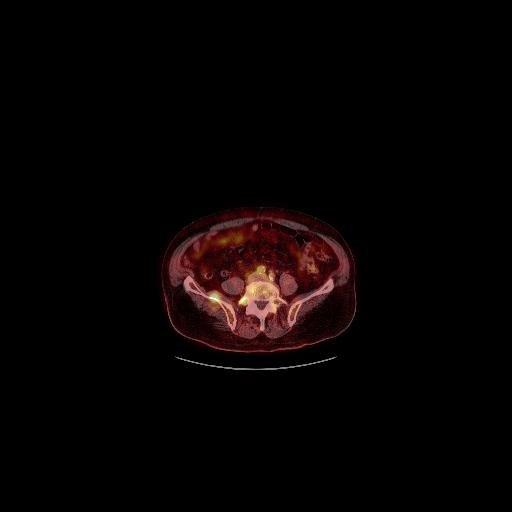

[Series 604: fused coronal · 1 of 86 slices shown]
[im 1/86]
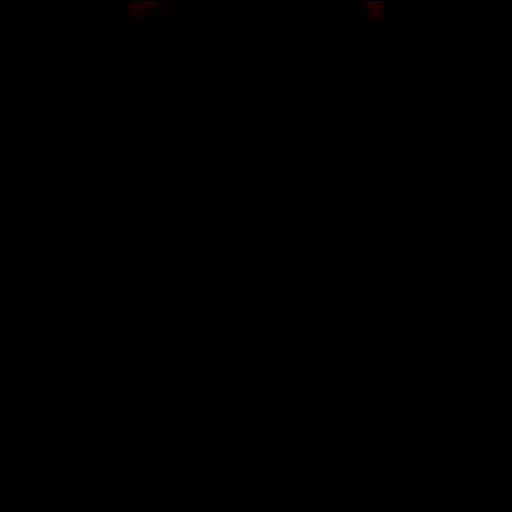

[Series 605: fused sagittal · 1 of 125 slices shown]
[im 1/125]
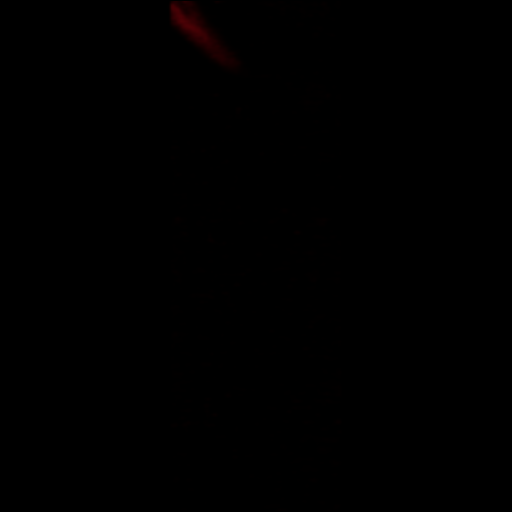

[Series 606: pet axial · 2 of 328 slices shown]
[im 110/328]
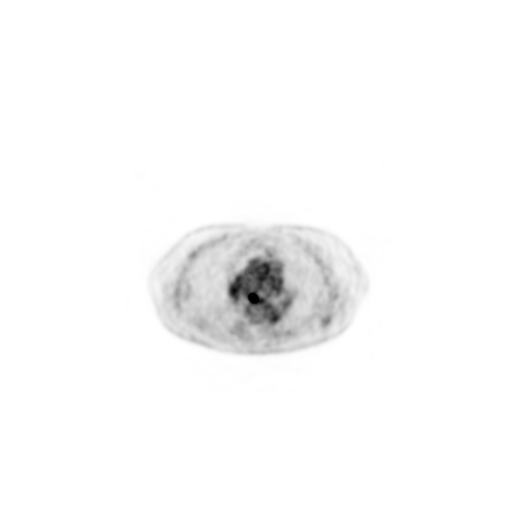
[im 328/328]
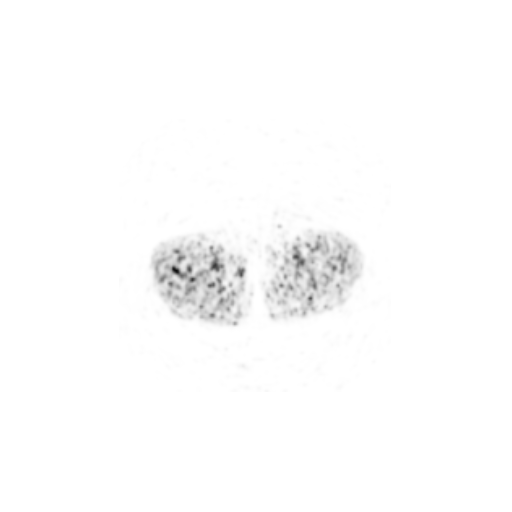

[Series 607: pet coronal · 1 of 135 slices shown]
[im 1/135]
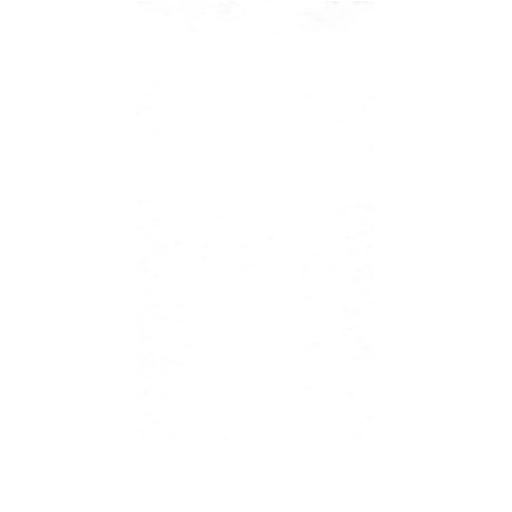

[Series 608: pet sagittal · 1 of 176 slices shown]
[im 1/176]
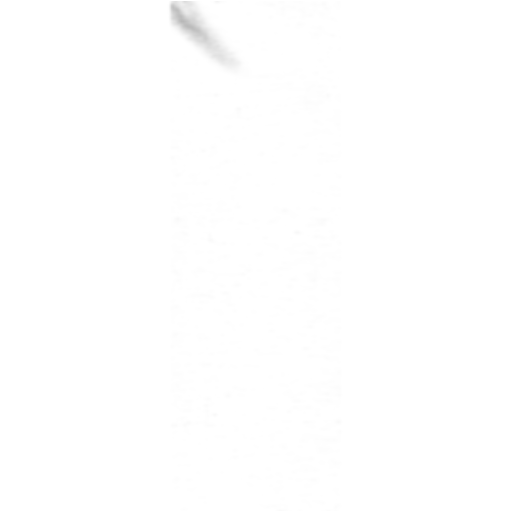

[Series 1137: results mm oncology reading · 5.0mm · 0.45mm/px · 1 of 21 slices shown]
[im 1/21]
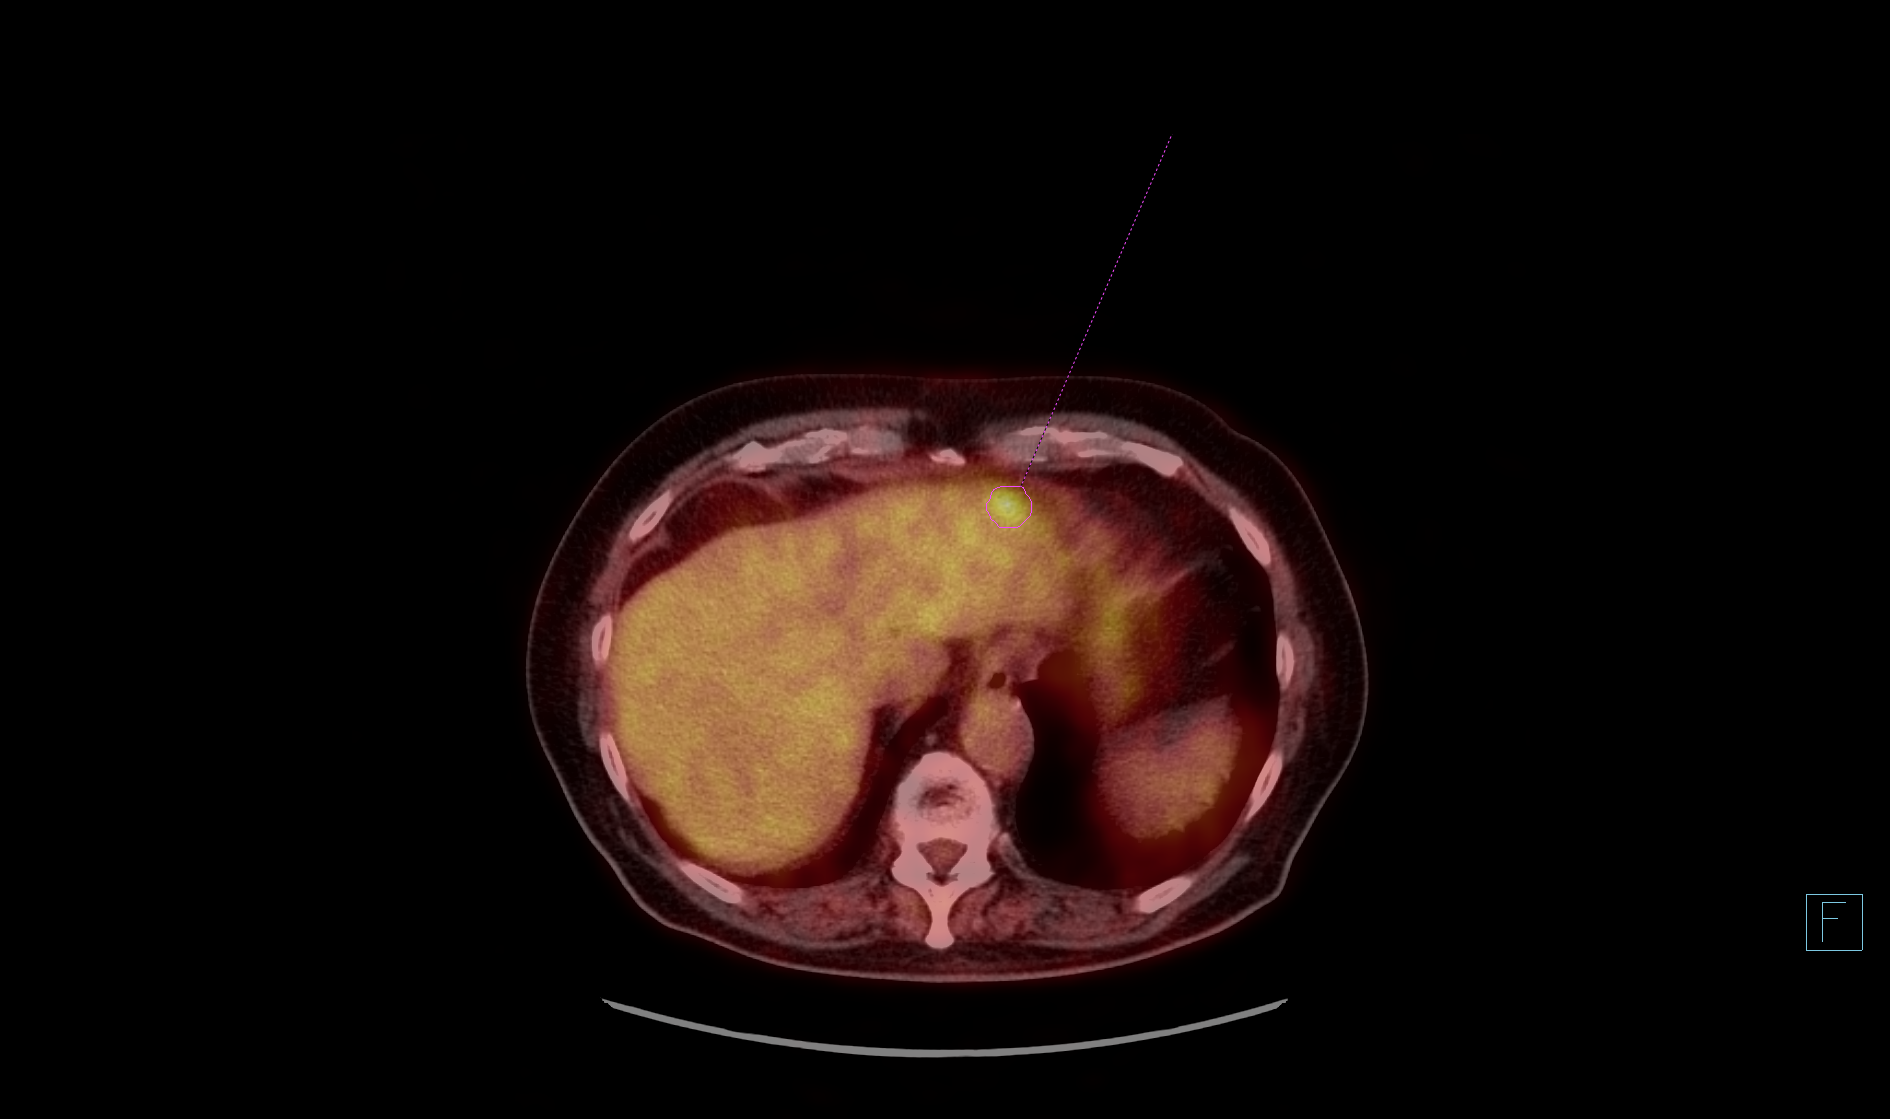

[15 of 25 positions shown; findings below may reference images not displayed]

FINDINGS: Mediastinal blood pool activity: SUV max

Liver activity: SUV max NA

NECK: No hypermetabolic lymph nodes in the neck.

Incidental CT findings: none

CHEST: There are enlarged and hypermetabolic mediastinal lymph
nodes.

9 mm upper right paratracheal lymph node on image 85/3 is
hypermetabolic with SUV max of 3.84.

Right-sided precarinal lymph node measures 11 mm on image 100/3 and
the SUV max is [REDACTED] mm subcarinal node on image 111/3 has an SUV max of 7.03. As
demonstrated on the recent CT scan there also multiple small
bilateral pulmonary nodules. These are below the limits of
sensitivity for PET CT but are very likely metastatic disease.

No supraclavicular or axillary adenopathy.

There are multiple muscle lesions consistent with muscle metastasis.
There is a lesion in the teres minor muscle which has an SUV max of
7.18. Other less hypermetabolic left shoulder girdle lesions are
noted. There is also a lesion in the right shoulder in the anterior
deltoid muscle which has an SUV max of 5.73.

Incidental CT findings: Stable aortic and coronary artery
calcifications.

ABDOMEN/PELVIS: Ill-defined 15 mm low-attenuation lesion in segment
2-3 of the liver is hypermetabolic with SUV max of 4.81. There is
also a 8 mm lesion in segment 6 far inferiorly which has an SUV max
of 4.72.

Small left adrenal gland nodule is mildly hypermetabolic with SUV
max of 3.14.

Small focus of hypermetabolism is noted in the pancreatic
head/uncinate process region. Difficult to see lesion on the CT
scan. It could represent a pancreatic metastasis or possibly an
adjacent lymph node. SUV max is 4.22.

Muscle lesions are noted in the left psoas muscle and also the left
paralumbar muscles.

Incidental CT findings: Advanced vascular calcifications. 3 cm
infrarenal abdominal aortic aneurysm appears stable. Severe sigmoid
colon diverticulosis.

SKELETON: Diffuse osseous metastatic disease is noted with numerous
hypermetabolic skeletal lesions. These include the left humeral
head, left scapula, right sternum, thoracic and lumbar vertebral
bodies, right iliac bone and left iliac bone.

Left humeral head lesion has an SUV max of 3.66.

L1 lesion has an SUV max of 4.87.

Right upper iliac bone lesion has an SUV max of 6.36.

Incidental CT findings: none
IMPRESSION: 1. Hypermetabolic mediastinal lymphadenopathy consistent with
metastatic disease. There are also several small pulmonary nodules
consistent with metastatic pulmonary disease.
2. Two metastatic lesions noted in the liver and a possible
metastatic focus in the pancreatic head and in the left adrenal
gland.
3. Widespread muscle and skeletal metastasis.

## 2020-02-20 MED ORDER — FLUDEOXYGLUCOSE F - 18 (FDG) INJECTION
11.3590 | Freq: Once | INTRAVENOUS | Status: AC | PRN
  Administered 2020-02-20: 11.359 via INTRAVENOUS

## 2020-02-20 NOTE — Telephone Encounter (Signed)
[place] Apt with pulmonary on 03/08/2020

## 2020-02-26 ENCOUNTER — Other Ambulatory Visit: Payer: Self-pay

## 2020-02-26 ENCOUNTER — Inpatient Hospital Stay (HOSPITAL_BASED_OUTPATIENT_CLINIC_OR_DEPARTMENT_OTHER): Payer: PPO | Admitting: Internal Medicine

## 2020-02-26 ENCOUNTER — Encounter: Payer: Self-pay | Admitting: Internal Medicine

## 2020-02-26 ENCOUNTER — Telehealth: Payer: Self-pay | Admitting: Internal Medicine

## 2020-02-26 ENCOUNTER — Other Ambulatory Visit: Payer: Self-pay | Admitting: Radiology

## 2020-02-26 ENCOUNTER — Telehealth: Payer: Self-pay | Admitting: *Deleted

## 2020-02-26 VITALS — BP 172/92 | HR 95 | Temp 97.5°F | Resp 16 | Ht 71.0 in | Wt 215.8 lb

## 2020-02-26 DIAGNOSIS — C3411 Malignant neoplasm of upper lobe, right bronchus or lung: Secondary | ICD-10-CM | POA: Diagnosis not present

## 2020-02-26 DIAGNOSIS — K769 Liver disease, unspecified: Secondary | ICD-10-CM | POA: Diagnosis not present

## 2020-02-26 NOTE — Telephone Encounter (Signed)
Spoke with patient. Patient will have liver biopsy tomorrow. Patient instructed to arrive at the [facility] outpatient medical mall at 8:30 am. He will bring a driver for this procedure. He understands that he should not eat/drink anything after midnight. Teach back process performed with patient.  Patient given Apt time to-follow-up in cancer center in 1 week to discuss results. 245 pm on 03/04/2020

## 2020-02-26 NOTE — Patient Instructions (Signed)
#   STOP asprin/FISH oil until the biopsy; can restart after 1-2 days

## 2020-02-26 NOTE — Assessment & Plan Note (Addendum)
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  Unfortunately, imaging concerning for recurrent malignancy-see below.  NOV 10th 2021- CT- Interval development of bilateral pulmonary nodules worrisome for metastatic disease; NOV 24th PET-  Interval increase in size of right hilar and Hypermetabolic mediastinal lymphadenopathy consistent with metastatic disease;  small pulmonary nodules consistent with metastatic pulmonary disease ; Two metastatic lesions noted in the liver and a possible metastatic focus in the pancreatic head and in the left adrenal gland; Widespread muscle and skeletal metastasis. Discussed with Dr.Yamagata; plan liver Biopsy.   #Discussed with the patient and family that if her biopsy is inconclusive-would recommend bronchoscopy/ with [facility] [dec 10th appt].    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   #I had a very long discussion with the patient and family regarding the significant concerns of malignancy as noted on the imaging.  Plan biopsy as above.  Discussed that given multiple organ involvement-is highly suggestive of stage IV malignancy.  Discussed treatments are palliative not curative.  Discussed the treatment options will be based upon results of the biopsy/NGS.   # Bone lesions-asymptomatic; candidate for Zometa.  *will call with results- [phone] # DISPOSITION:  # Liver Biopsy asap # follow up in 4-5 days post liver Biopsy- Dr.B  # I reviewed the blood work- with the patient in detail; also reviewed the imaging independently [as summarized above]; and with the patient in detail.   # 40 minutes face-to-face with the patient discussing the above plan of care; more than 50% of time spent on prognosis/ natural history; counseling and coordination.   Cc: Dr.Hedrrick.

## 2020-02-26 NOTE — Progress Notes (Signed)
Faxed liver biopsy orders to specialty scheduling

## 2020-02-26 NOTE — Progress Notes (Signed)
[place] NOTE  Patient Care Team: [name], MD as PCP - General (Family Medicine) [name], MD as Consulting Physician (Family Medicine) [name], [name], MD (General Surgery)  CHIEF COMPLAINTS/PURPOSE OF CONSULTATION: Lung cancer  #  Oncology History Overview Note  # OCT 2018- STAGE I- A. 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  - PARENCHYMAL MARGIN APPEARS CLEAR; TUMOR WAS 1 CM FROM MARGIN BEFORE REMOVAL OF STAPLE LINE.; NO adjuvant therapy.   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  Short course of Eliquis.smoking-quit 2008.  ---------------------------------------    DIAGNOSIS: LUNG CA  STAGE:  I       ;GOALS: cure  CURRENT/MOST RECENT THERAPY: surveillaince    Primary cancer of right upper lobe of lung (HCC)     HISTORY OF PRESENTING ILLNESS:  Curtis Andrade. 74 y.o.  male right upper lung stage I lung cancer [2018]; history of laryngeal cancer [2010] is here for follow-up/review the results of the PEt scan.  Patient denies any lumps or bumps.  No weight loss.  No nausea no vomiting.  No fevers or chills.  No headaches.    Review of Systems  Constitutional: Negative for chills, diaphoresis, fever, malaise/fatigue and weight loss.  HENT: Negative for nosebleeds and sore throat.   Eyes: Negative for double vision.  Respiratory: Negative for cough, hemoptysis, sputum production, shortness of breath and wheezing.   Cardiovascular: Negative for chest pain, palpitations, orthopnea and leg swelling.  Gastrointestinal: Negative for abdominal pain, blood in stool, constipation, diarrhea, heartburn, melena, nausea and vomiting.  Genitourinary: Negative for dysuria, frequency and urgency.  Musculoskeletal: Negative for back pain and joint pain.   Skin: Negative.  Negative for itching and rash.  Neurological: Negative for dizziness, tingling, focal weakness, weakness and headaches.  Endo/Heme/Allergies: Does not bruise/bleed easily.  Psychiatric/Behavioral: Negative for depression. The patient is not nervous/anxious and does not have insomnia.      MEDICAL HISTORY:  Past Medical History:  Diagnosis Date  . 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 Colon polyp 2013  . Hemorrhoids    pt denies  . 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 Baruch Gouty and Dr. Nadeen Landau  . Phlebitis 1998  . Primary cancer of right upper lobe of lung ([place]) 01/14/2017   Dr. Genevive Bi performed a wedge resection of RUL.   . 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/27/2016    SURGICAL HISTORY: Past Surgical History:  Procedure Laterality Date  . COLONOSCOPY  2013   Dr Vira Agar  . COLONOSCOPY WITH PROPOFOL N/A 12/23/2016   Procedure: COLONOSCOPY WITH PROPOFOL;  Surgeon: Robert Bellow, MD;  Location: [facility] ENDOSCOPY;  Service: Endoscopy;  Laterality: N/A;  . EYE SURGERY    . HERNIA REPAIR Right 1990's  . TENDON REPAIR Left 03/14/2019   Procedure: TENDON REPAIR AND GRAFT  TRANSFER EXTENSOR INDICIS PROPRIUS LEFT THUMB;  Surgeon: [name], MD;  Location: [facility];  Service: Orthopedics;  Laterality: Left;  AXILLARY BLOCK  . THORACOTOMY Right 01/14/2017   Procedure: RIGHT THORACOSCOPYWITH WIDE WEDGE RESECTION,  PREOP BROCHOSCOPY;  Surgeon: [name], MD;  Location: [facility] ORS;  Service: General;  Laterality: Right;  . THROAT SURGERY  2010    SOCIAL HISTORY: Social History  Socioeconomic History  . Marital status: Married    Spouse name: Not on file  . Number of children: Not on file  . Years of education: Not on file  . Highest education level: Not on file  Occupational History  . Not on file  Tobacco Use  . Smoking status: Former Smoker     Years: 50.00    Quit date: 03/31/2007    Years since quitting: 12.9  . Smokeless tobacco: Never Used  Vaping Use  . Vaping Use: Never used  Substance and Sexual Activity  . Alcohol use: No  . Drug use: No  . Sexual activity: Not on file  Other Topics Concern  . Not on file  Social History Narrative  . Not on file   Social Determinants of Health   Financial Resource Strain:   . Difficulty of Paying Living Expenses: Not on file  Food Insecurity:   . Worried About Charity fundraiser in the Last Year: Not on file  . Ran Out of Food in the Last Year: Not on file  Transportation Needs:   . Lack of Transportation (Medical): Not on file  . Lack of Transportation (Non-Medical): Not on file  Physical Activity:   . Days of Exercise per Week: Not on file  . Minutes of Exercise per Session: Not on file  Stress:   . Feeling of Stress : Not on file  Social Connections:   . Frequency of Communication with Friends and Family: Not on file  . Frequency of Social Gatherings with Friends and Family: Not on file  . Attends Religious Services: Not on file  . Active Member of Clubs or Organizations: Not on file  . Attends Archivist Meetings: Not on file  . Marital Status: Not on file  Intimate Partner Violence:   . Fear of Current or Ex-Partner: Not on file  . Emotionally Abused: Not on file  . Physically Abused: Not on file  . Sexually Abused: Not on file    FAMILY HISTORY: Family History  Problem Relation Age of Onset  . Alzheimer's disease Mother   . Alzheimer's disease Father   . Stroke Father   . Colon cancer Neg Hx     ALLERGIES:  has No Known Allergies.  MEDICATIONS:  Current Outpatient Medications  Medication Sig Dispense Refill  . Ascorbic Acid (VITAMIN C) 1000 MG tablet Take 1,000 mg by mouth daily.    Marland Kitchen aspirin EC 81 MG tablet Take 81 mg by mouth daily.    . folic acid (FOLVITE) 213 MCG tablet Take 800 mcg by mouth daily.    . Glucosamine-Chondroitin  (COSAMIN DS PO) Take 1 tablet by mouth daily.    . Multiple Vitamin (MULTIVITAMIN WITH MINERALS) TABS tablet Take 1 tablet by mouth daily. Senior Multivitamin.    . Omega-3 1000 MG CAPS Take 1 tablet by mouth 1 day or 1 dose.    Marland Kitchen omeprazole (PRILOSEC) 40 MG capsule Take 40 mg by mouth daily before supper.     . vitamin E 400 UNIT capsule Take 1 capsule by mouth 1 day or 1 dose.     No current facility-administered medications for this visit.      Marland Kitchen  PHYSICAL EXAMINATION: ECOG PERFORMANCE STATUS: 0 - Asymptomatic  Vitals:   02/26/20 1338  BP: (!) 172/92  Pulse: 95  Resp: 16  Temp: (!) 97.5 F (36.4 C)  SpO2: 99%   Filed Weights   02/26/20 1338  Weight: 215 lb 12.8  oz (97.9 kg)    Physical Exam Constitutional:      Comments: Ambulating by independently.  Accompanied by his wife.  His 2 daughters over the phone/FaceTime.  HENT:     Head: Normocephalic and atraumatic.     Mouth/Throat:     Pharynx: No oropharyngeal exudate.  Eyes:     Pupils: Pupils are equal, round, and reactive to light.  Cardiovascular:     Rate and Rhythm: Normal rate and regular rhythm.  Pulmonary:     Effort: No respiratory distress.     Breath sounds: No wheezing.  Abdominal:     General: Bowel sounds are normal. There is no distension.     Palpations: Abdomen is soft. There is no mass.     Tenderness: There is no abdominal tenderness. There is no guarding or rebound.  Musculoskeletal:        General: No tenderness. Normal range of motion.     Cervical back: Normal range of motion and neck supple.  Skin:    General: Skin is warm.  Neurological:     Mental Status: He is alert and oriented to person, place, and time.  Psychiatric:        Mood and Affect: Affect normal.   ;  LABORATORY DATA:  I have reviewed the data as listed Lab Results  Component Value Date   WBC 8.0 02/12/2020   HGB 14.8 02/12/2020   HCT 44.5 02/12/2020   MCV 94.9 02/12/2020   PLT 211 02/12/2020   Recent Labs     02/09/20 1011 02/12/20 0941  NA  --  135  K  --  4.6  CL  --  101  CO2  --  27  GLUCOSE  --  104*  BUN  --  15  CREATININE 1.10 0.95  CALCIUM  --  8.9  GFRNONAA  --  >60  PROT  --  7.7  ALBUMIN  --  4.0  AST  --  28  ALT  --  24  ALKPHOS  --  51  BILITOT  --  0.7    RADIOGRAPHIC STUDIES: I have personally reviewed the radiological images as listed and agreed with the findings in the report. CT Chest W Contrast  Result Date: 02/09/2020 CLINICAL DATA:  Restaging lung cancer. Status post right upper lobe wedge resection. EXAM: CT CHEST WITH CONTRAST TECHNIQUE: Multidetector CT imaging of the chest was performed during intravenous contrast administration. CONTRAST:  39mL OMNIPAQUE IOHEXOL 300 MG/ML  SOLN COMPARISON:  02/08/2019 FINDINGS: Cardiovascular: Heart size appears normal. No pericardial effusion. Aortic atherosclerosis. Coronary artery calcifications. Mediastinum/Nodes: Normal appearance of the thyroid gland. The trachea appears patent and is midline. Normal appearance of the esophagus. Interval development of mediastinal and right hilar adenopathy: New right paratracheal lymph node measures 2 cm, image 55/2. High right paratracheal lymph node measures 1 cm, image 35/2. Previously 0.5 cm. Subcarinal lymph node measures 1.3 cm, image 71/2. Previously 1.2 cm. 1.6 cm right hilar lymph node is noted, image 67/2. Previously 1.1 cm. Right inferior hilar lymph node measures 1.1 cm, image 87/2. Previously 0.4 cm. Lungs/Pleura: Centrilobular and paraseptal emphysema. Multiple scattered small pulmonary nodules are noted in both lungs: Along the suture line within the paramediastinal right upper lobe there is a new nodule measuring 6 mm, image 50/3. Adjacent subpleural nodule measures 7 mm, image 56/3. Also new from previous exam. Within the anteromedial left upper lobe subpleural nodule measures 8 mm, image 72/3. New from previous exam. Also within the  anteromedial left upper lobe is a new 6  mm subpleural nodule, image 76/3. Posterior basal right upper lobe nodule measures 5 mm, image 74/3. New from previous exam. Right middle lobe lung nodule measures 4 mm, image 73/3. New from previous exam. Tiny right lung nodule near the base measures 3 mm and appears new from the previous exam, image 114/3. Posterior left base nodule measures 3 mm, image 137/3. New from previous study. Upper Abdomen: Unchanged small cyst within anterior right lobe of liver measuring 1.1 cm. Gallstones are again noted within the gallbladder. Stable low-attenuation enlargement of the left adrenal gland which is favored to represent adenomatous hyperplasia. Scarring is noted involving the upper pole of right kidney. Musculoskeletal: No chest wall abnormality. No acute or significant osseous findings. IMPRESSION: 1. Interval development of bilateral pulmonary nodules worrisome for metastatic disease. 2. Interval increase in size of right hilar and mediastinal lymph nodes worrisome for metastatic adenopathy. 3. Coronary artery calcifications noted. 4. Gallstones. Aortic Atherosclerosis (ICD10-I70.0) and Emphysema (ICD10-J43.9). Electronically Signed   By: Kerby Moors M.D.   On: 02/09/2020 14:01   NM PET Image Restag (PS) Skull Base To Thigh  Result Date: 02/20/2020 CLINICAL DATA:  Subsequent treatment strategy for non-small cell lung cancer. History of right upper lobe wedge resection. Interval development of bilateral pulmonary nodules and adenopathy. EXAM: NUCLEAR MEDICINE PET SKULL BASE TO THIGH TECHNIQUE: 11.359 mCi F-18 FDG was injected intravenously. Full-ring PET imaging was performed from the skull base to thigh after the radiotracer. CT data was obtained and used for attenuation correction and anatomic localization. Fasting blood glucose: 90 mg/dl COMPARISON:  Multiple prior chest CTs and a prior PET-CT from 11/24/2016. The most recent chest CT is 02/09/2020 FINDINGS: Mediastinal blood pool activity: SUV max 1.79 Liver  activity: SUV max NA NECK: No hypermetabolic lymph nodes in the neck. Incidental CT findings: none CHEST: There are enlarged and hypermetabolic mediastinal lymph nodes. 9 mm upper right paratracheal lymph node on image 70/0 is hypermetabolic with SUV max of 1.74. Right-sided precarinal lymph node measures 11 mm on image 100/3 and the SUV max is 6.07. 10 mm subcarinal node on image 111/3 has an SUV max of 7.03. As demonstrated on the recent CT scan there also multiple small bilateral pulmonary nodules. These are below the limits of sensitivity for PET CT but are very likely metastatic disease. No supraclavicular or axillary adenopathy. There are multiple muscle lesions consistent with muscle metastasis. There is a lesion in the teres minor muscle which has an SUV max of 7.18. Other less hypermetabolic left shoulder girdle lesions are noted. There is also a lesion in the right shoulder in the anterior deltoid muscle which has an SUV max of 5.73. Incidental CT findings: Stable aortic and coronary artery calcifications. ABDOMEN/PELVIS: Ill-defined 15 mm low-attenuation lesion in segment 2-3 of the liver is hypermetabolic with SUV max of 9.44. There is also a 8 mm lesion in segment 6 far inferiorly which has an SUV max of 4.72. Small left adrenal gland nodule is mildly hypermetabolic with SUV max of 9.67. Small focus of hypermetabolism is noted in the pancreatic head/uncinate process region. Difficult to see lesion on the CT scan. It could represent a pancreatic metastasis or possibly an adjacent lymph node. SUV max is 4.22. Muscle lesions are noted in the left psoas muscle and also the left paralumbar muscles. Incidental CT findings: Advanced vascular calcifications. 3 cm infrarenal abdominal aortic aneurysm appears stable. Severe sigmoid colon diverticulosis. SKELETON: Diffuse osseous metastatic disease is  noted with numerous hypermetabolic skeletal lesions. These include the left humeral head, left scapula, right  sternum, thoracic and lumbar vertebral bodies, right iliac bone and left iliac bone. Left humeral head lesion has an SUV max of 3.66. L1 lesion has an SUV max of 4.87. Right upper iliac bone lesion has an SUV max of 6.36. Incidental CT findings: none IMPRESSION: 1. Hypermetabolic mediastinal lymphadenopathy consistent with metastatic disease. There are also several small pulmonary nodules consistent with metastatic pulmonary disease. 2. Two metastatic lesions noted in the liver and a possible metastatic focus in the pancreatic head and in the left adrenal gland. 3. Widespread muscle and skeletal metastasis. Electronically Signed   By: Marijo Sanes M.D.   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  Unfortunately, imaging concerning for recurrent malignancy-see below.  NOV 10th 2021- CT- Interval development of bilateral pulmonary nodules worrisome for metastatic disease; NOV 24th PET-  Interval increase in size of right hilar and Hypermetabolic mediastinal lymphadenopathy consistent with metastatic disease;  small pulmonary nodules consistent with metastatic pulmonary disease ; Two metastatic lesions noted in the liver and a possible metastatic focus in the pancreatic head and in the left adrenal gland; Widespread muscle and skeletal metastasis. Discussed with Dr.Yamagata; plan liver Biopsy.   #Discussed with the patient and family that if her biopsy is inconclusive-would recommend bronchoscopy/ with [facility] [dec 10th appt].    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   #I had a very long discussion with the patient and family regarding the significant concerns of malignancy as noted on the imaging.  Plan biopsy as above.  Discussed that given multiple organ involvement-is highly  suggestive of stage IV malignancy.  Discussed treatments are palliative not curative.  Discussed the treatment options will be based upon results of the biopsy/NGS.   # Bone lesions-asymptomatic; candidate for Zometa.  *will call with results- [phone] # DISPOSITION:  # Liver Biopsy asap # follow up in 4-5 days post liver Biopsy- Dr.B  # I reviewed the blood work- with the patient in detail; also reviewed the imaging independently [as summarized above]; and with the patient in detail.   # 40 minutes face-to-face with the patient discussing the above plan of care; more than 50% of time spent on prognosis/ natural history; counseling and coordination.   Cc: Dr.Hedrrick.    All questions were answered. The patient knows to call the clinic with any problems, questions or concerns.    Cammie Sickle, MD 02/26/2020 4:54 PM

## 2020-02-26 NOTE — Telephone Encounter (Signed)
On 11/29-spoke to patient regarding results of the PET scan concerning for metastatic disease.   Recommend follow-up today at 1:45.  Patient will come to the cancer center at 1:30. [place]- please schedule; Do not have to call pt.   GB

## 2020-02-27 ENCOUNTER — Other Ambulatory Visit: Payer: Self-pay

## 2020-02-27 ENCOUNTER — Ambulatory Visit
Admission: RE | Admit: 2020-02-27 | Discharge: 2020-02-27 | Disposition: A | Discharge status: Home / Self Care | Payer: PPO | Source: Ambulatory Visit | Attending: Internal Medicine | Admitting: Internal Medicine

## 2020-02-27 ENCOUNTER — Other Ambulatory Visit: Payer: Self-pay | Admitting: Internal Medicine

## 2020-02-27 DIAGNOSIS — Z87891 Personal history of nicotine dependence: Secondary | ICD-10-CM | POA: Diagnosis not present

## 2020-02-27 DIAGNOSIS — Z79899 Other long term (current) drug therapy: Secondary | ICD-10-CM | POA: Diagnosis not present

## 2020-02-27 DIAGNOSIS — M899 Disorder of bone, unspecified: Secondary | ICD-10-CM | POA: Diagnosis not present

## 2020-02-27 DIAGNOSIS — Z8521 Personal history of malignant neoplasm of larynx: Secondary | ICD-10-CM | POA: Diagnosis not present

## 2020-02-27 DIAGNOSIS — M898X8 Other specified disorders of bone, other site: Secondary | ICD-10-CM | POA: Diagnosis not present

## 2020-02-27 DIAGNOSIS — C3411 Malignant neoplasm of upper lobe, right bronchus or lung: Secondary | ICD-10-CM | POA: Insufficient documentation

## 2020-02-27 DIAGNOSIS — K769 Liver disease, unspecified: Secondary | ICD-10-CM | POA: Diagnosis not present

## 2020-02-27 DIAGNOSIS — Z85118 Personal history of other malignant neoplasm of bronchus and lung: Secondary | ICD-10-CM | POA: Diagnosis not present

## 2020-02-27 DIAGNOSIS — C792 Secondary malignant neoplasm of skin: Secondary | ICD-10-CM | POA: Insufficient documentation

## 2020-02-27 DIAGNOSIS — C329 Malignant neoplasm of larynx, unspecified: Secondary | ICD-10-CM | POA: Diagnosis not present

## 2020-02-27 DIAGNOSIS — R590 Localized enlarged lymph nodes: Secondary | ICD-10-CM | POA: Diagnosis not present

## 2020-02-27 DIAGNOSIS — Z7982 Long term (current) use of aspirin: Secondary | ICD-10-CM | POA: Insufficient documentation

## 2020-02-27 DIAGNOSIS — K7689 Other specified diseases of liver: Secondary | ICD-10-CM | POA: Diagnosis not present

## 2020-02-27 LAB — PROTIME-INR
INR: 1 (ref 0.8–1.2)
Prothrombin Time: 12.7 seconds (ref 11.4–15.2)

## 2020-02-27 LAB — CBC
HCT: 42.7 % (ref 39.0–52.0)
Hemoglobin: 14.2 g/dL (ref 13.0–17.0)
MCH: 31.8 pg (ref 26.0–34.0)
MCHC: 33.3 g/dL (ref 30.0–36.0)
MCV: 95.7 fL (ref 80.0–100.0)
Platelets: 221 10*3/uL (ref 150–400)
RBC: 4.46 MIL/uL (ref 4.22–5.81)
RDW: 14.5 % (ref 11.5–15.5)
WBC: 7.2 10*3/uL (ref 4.0–10.5)
nRBC: 0 % (ref 0.0–0.2)

## 2020-02-27 IMAGING — US IR BIOPSY CORE MUSCLE/SOFT TISSUE
1 series · 15 of 15 positions shown · non-contrast
Comparison: none

INDICATION: 74-year-old with history of lung and laryngeal cancer. Patient has
evidence of metastatic disease based on recent PET-CT and patient
needs a tissue diagnosis. Patient has a hypermetabolic muscular
lesion posterior to the left scapula.

[Series 1: us biopsy · 15 of 15 slices shown]
[im 1/15]
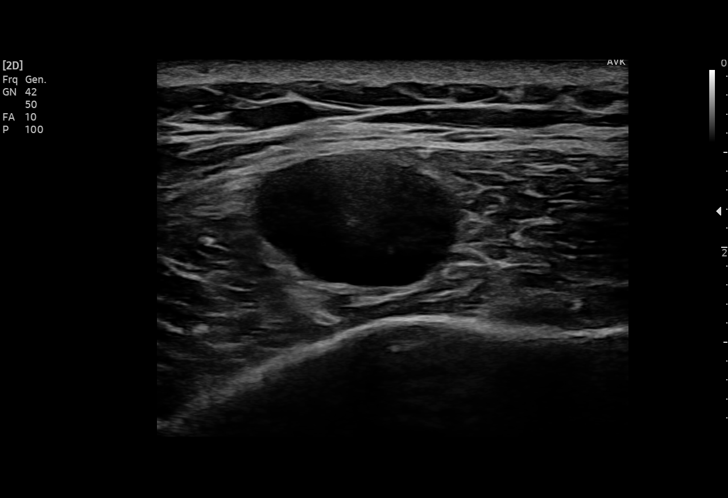
[im 2/15]
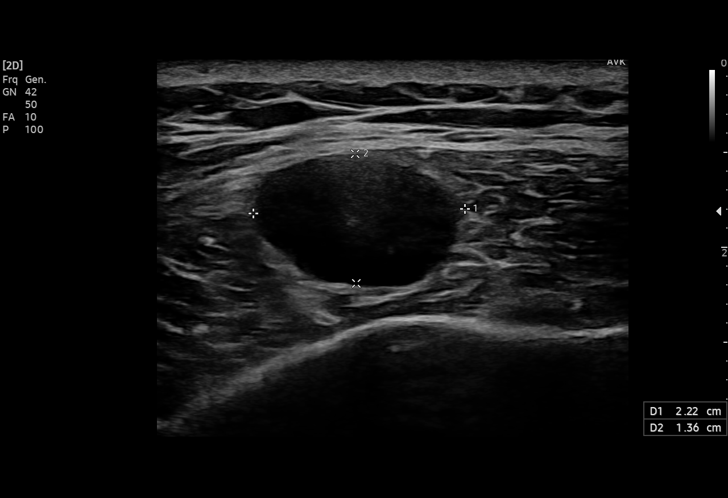
[im 3/15]
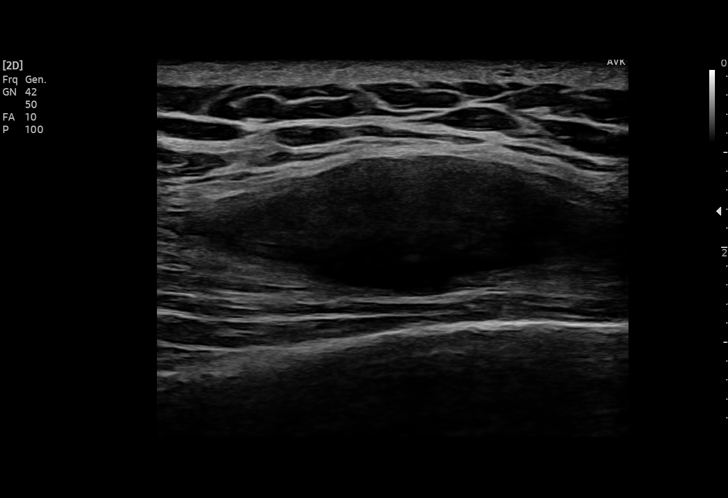
[im 4/15]
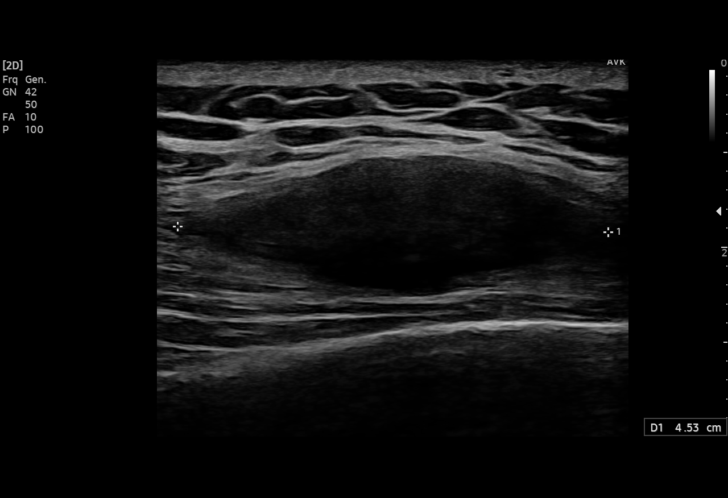
[im 5/15]
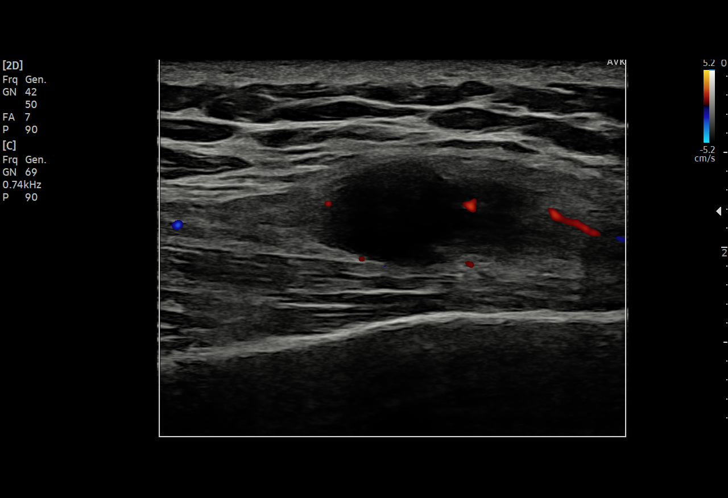
[im 6/15]
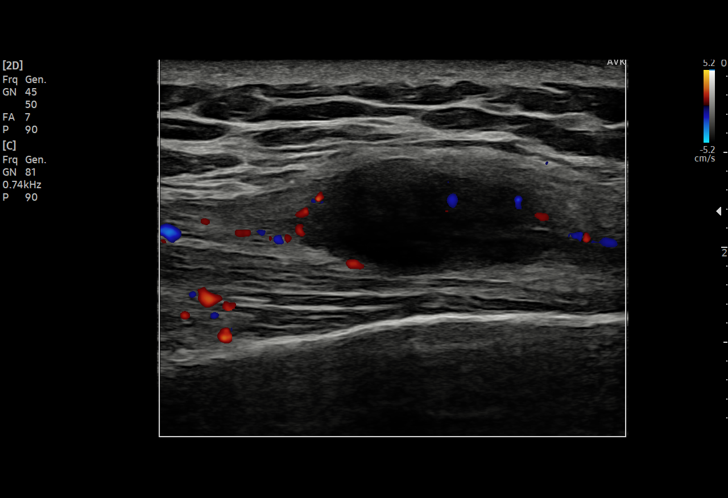
[im 7/15]
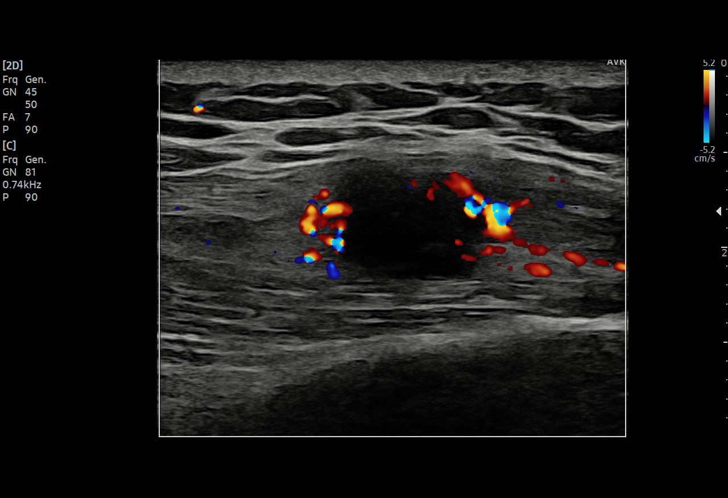
[im 8/15]
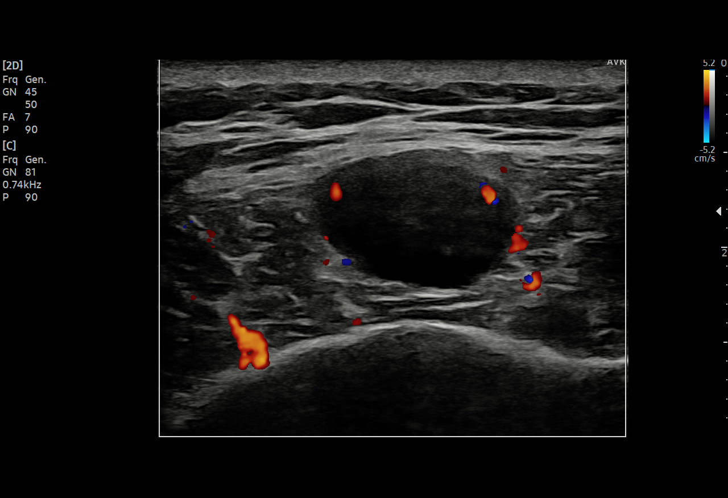
[im 9/15]
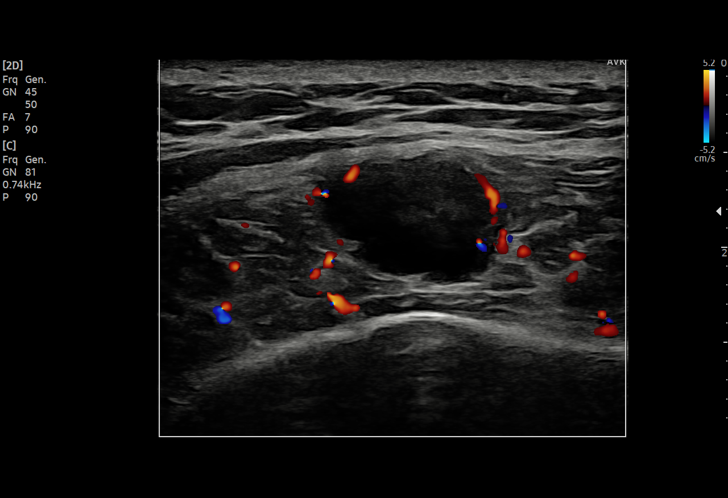
[im 10/15]
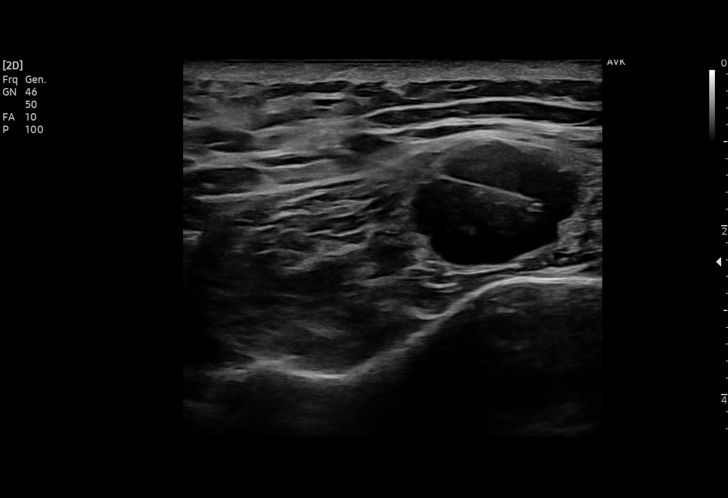
[im 11/15]
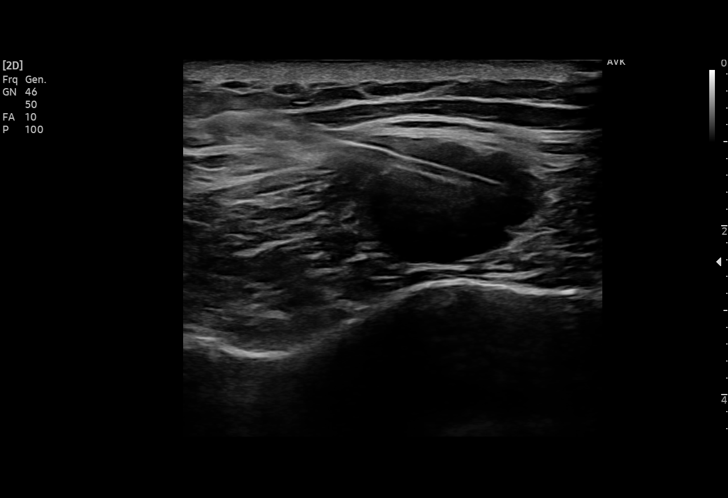
[im 12/15]
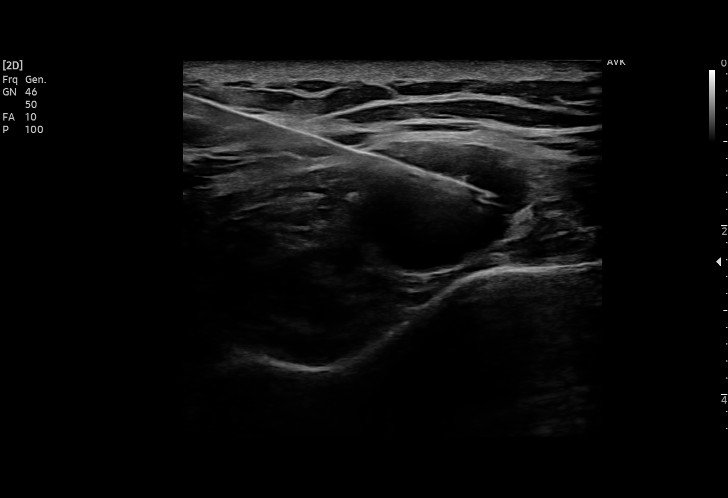
[im 13/15]
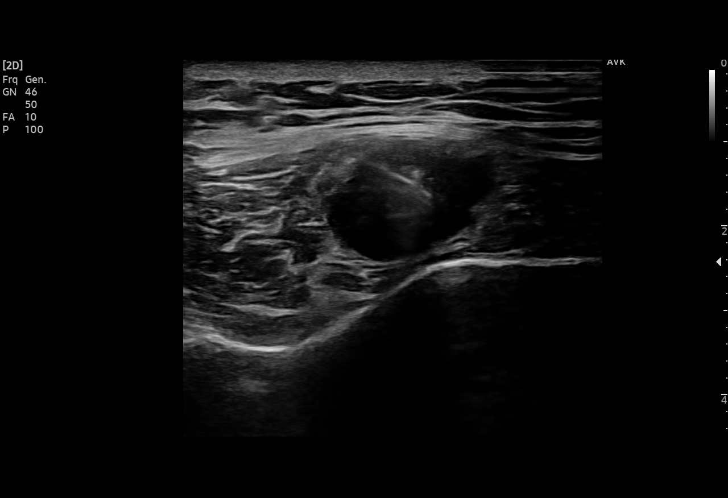
[im 14/15]
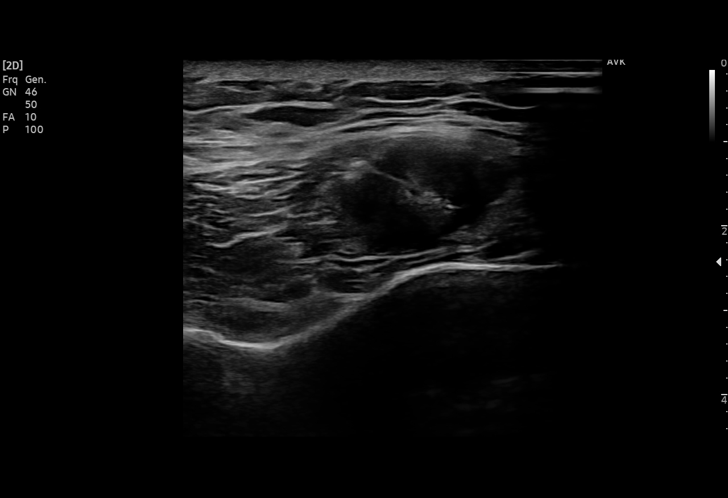
[im 15/15]
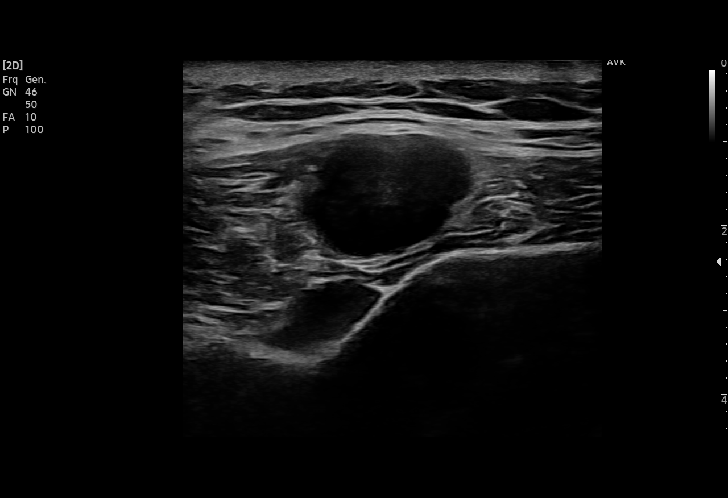

[15 of 15 positions shown; findings below may reference images not displayed]

EXAM:
ULTRASOUND-GUIDED CORE BIOPSY OF A SOFT TISSUE LESION IN THE LEFT
POSTERIOR CHEST

MEDICATIONS:
None.

ANESTHESIA/SEDATION:
Moderate (conscious) sedation was employed during this procedure. A
total of Versed 1.0 mg and Fentanyl 50 mcg was administered
intravenously.

Moderate Sedation Time: 11 minutes. The patient's level of
consciousness and vital signs were monitored continuously by
radiology nursing throughout the procedure under my direct
supervision.

FLUOROSCOPY TIME:  Fluoroscopy Time: None

COMPLICATIONS:
None immediate.

PROCEDURE:
Informed written consent was obtained from the patient after a
thorough discussion of the procedural risks, benefits and
alternatives. All questions were addressed. A timeout was performed
prior to the initiation of the procedure.

Area of concern is the left posterior chest. Hypoechoic
intramuscular lesion was identified. This finding corresponds with
the recent PET-CT findings. The overlying skin was prepped with
chlorhexidine and sterile field was created. Skin and soft tissues
were anesthetized using 1% lidocaine. Small incision was made. Using
ultrasound guidance, 17 gauge coaxial needle was directed into the
lesion. Total of 5 core biopsies were obtained with an 18 gauge core
device. Specimens placed in formalin. 17 gauge needle was removed
without complication. Bandage placed over the puncture site.
FINDINGS: Hypoechoic lesion posterior to left scapula. Lesion measures roughly
4.5 x 1.4 x 2.2 cm. Five adequate core specimens were obtained. No
immediate bleeding or hematoma formation.
IMPRESSION: Successful ultrasound guided core biopsies to a soft tissue lesion
in the left posterior chest musculature.

## 2020-02-27 MED ORDER — MIDAZOLAM HCL 2 MG/2ML IJ SOLN
INTRAMUSCULAR | Status: AC
  Filled 2020-02-27: qty 2

## 2020-02-27 MED ORDER — SODIUM CHLORIDE 0.9 % IV SOLN: INTRAVENOUS | Status: DC

## 2020-02-27 MED ORDER — FENTANYL CITRATE (PF) 100 MCG/2ML IJ SOLN
INTRAMUSCULAR | Status: AC | PRN
  Administered 2020-02-27: 50 ug via INTRAVENOUS

## 2020-02-27 MED ORDER — FENTANYL CITRATE (PF) 100 MCG/2ML IJ SOLN
INTRAMUSCULAR | Status: AC
  Filled 2020-02-27: qty 2

## 2020-02-27 MED ORDER — MIDAZOLAM HCL 2 MG/2ML IJ SOLN
INTRAMUSCULAR | Status: AC | PRN
  Administered 2020-02-27: 1 mg via INTRAVENOUS

## 2020-02-27 NOTE — Progress Notes (Signed)
Patient clinically stable post Lesion biopsy per Dr Anselm Pancoast, tolerated well. Awake/alert and oriented post procedure. recevied Versed 1 mg along with Fentanyl 50 mcg IV for procedure. Report given to Fransico Michael Rn post procedure.

## 2020-02-27 NOTE — Procedures (Signed)
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   Procedure: US guided core biopsy of left back muscular lesion  Findings: Hypoechoic lesion.  Multiple cores obtained.   Complications: None     EBL: less than 5 ml  Plan: Discharge to home.    Ahjanae Cassel R. Anselm Pancoast, MD  Pager: [phone]

## 2020-02-27 NOTE — Consult Note (Signed)
Chief Complaint: Patient was seen in consultation today for image guided liver lesion vs muscle lesion biopsy  Referring Physician(s): Cammie Sickle  Supervising Physician: Markus Daft  Patient Status: [facility] - Out-pt  History of Present Illness: Curtis Drost. is a 74 y.o. male , ex-smoker, with history of laryngeal carcinoma in 2010 and right lung adenocarcinoma with prior wedge resection of a right upper lobe mass in 2018.  Recent PET scan has revealed hypermetabolic mediastinal lymphadenopathy as well as several small pulmonary nodules consistent with metastatic pulmonary disease .  There are also 2 metastatic lesions noted in the liver and a possible metastatic focus in the pancreatic head and in the left adrenal gland as well as widespread muscle and skeletal metastases.  He presents today for image guided liver lesion versus muscle lesion biopsy. Additional med hx as below.   Past Medical History:  Diagnosis Date  . 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 Colon polyp 2013  . Hemorrhoids    pt denies  . 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 Baruch Gouty and Dr. Nadeen Landau  . Phlebitis 1998  . Primary cancer of right upper lobe of lung ([place]) 01/14/2017   Dr. Genevive Bi performed a wedge resection of RUL.   . 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/27/2016    Past Surgical History:  Procedure Laterality Date  . COLONOSCOPY  2013   Dr Vira Agar  . COLONOSCOPY WITH PROPOFOL N/A 12/23/2016   Procedure: COLONOSCOPY WITH PROPOFOL;  Surgeon: Robert Bellow, MD;  Location: [facility] ENDOSCOPY;  Service: Endoscopy;  Laterality: N/A;  . EYE SURGERY    . HERNIA REPAIR Right 1990's  . TENDON REPAIR Left 03/14/2019   Procedure: TENDON REPAIR AND GRAFT  TRANSFER EXTENSOR INDICIS PROPRIUS LEFT THUMB;  Surgeon: [name], MD;  Location: [facility];  Service: Orthopedics;   Laterality: Left;  AXILLARY BLOCK  . THORACOTOMY Right 01/14/2017   Procedure: RIGHT THORACOSCOPYWITH WIDE WEDGE RESECTION,  PREOP BROCHOSCOPY;  Surgeon: [name], MD;  Location: [facility] ORS;  Service: General;  Laterality: Right;  . THROAT SURGERY  2010    Allergies: Patient has no known allergies.  Medications: Prior to Admission medications   Medication Sig Start Date End Date Taking? Authorizing Provider  Ascorbic Acid (VITAMIN C) 1000 MG tablet Take 1,000 mg by mouth daily.   Yes [provider]  aspirin EC 81 MG tablet Take 81 mg by mouth daily.   Yes [provider]  folic acid (FOLVITE) 283 MCG tablet Take 800 mcg by mouth daily.   Yes [provider]  Glucosamine-Chondroitin (COSAMIN DS PO) Take 1 tablet by mouth daily.   Yes [provider]  Multiple Vitamin (MULTIVITAMIN WITH MINERALS) TABS tablet Take 1 tablet by mouth daily. Senior Multivitamin.   Yes [provider]  Omega-3 1000 MG CAPS Take 1 tablet by mouth 1 day or 1 dose.   Yes [provider]  omeprazole (PRILOSEC) 40 MG capsule Take 40 mg by mouth daily before supper.  11/04/16  Yes [provider]  vitamin E 400 UNIT capsule Take 1 capsule by mouth 1 day or 1 dose.   Yes [provider]     Family History  Problem Relation Age of Onset  . Alzheimer's disease Mother   . Alzheimer's disease Father   . Stroke Father   . Colon cancer Neg Hx  Social History   Socioeconomic History  . Marital status: Married    Spouse name: Not on file  . Number of children: Not on file  . Years of education: Not on file  . Highest education level: Not on file  Occupational History  . Not on file  Tobacco Use  . Smoking status: Former Smoker    Years: 50.00    Quit date: 03/31/2007    Years since quitting: 12.9  . Smokeless tobacco: Never Used  Vaping Use  . Vaping Use: Never used  Substance and Sexual Activity  . Alcohol use: No  . Drug use: No    . Sexual activity: Not on file  Other Topics Concern  . Not on file  Social History Narrative  . Not on file   Social Determinants of Health   Financial Resource Strain:   . Difficulty of Paying Living Expenses: Not on file  Food Insecurity:   . Worried About Charity fundraiser in the Last Year: Not on file  . Ran Out of Food in the Last Year: Not on file  Transportation Needs:   . Lack of Transportation (Medical): Not on file  . Lack of Transportation (Non-Medical): Not on file  Physical Activity:   . Days of Exercise per Week: Not on file  . Minutes of Exercise per Session: Not on file  Stress:   . Feeling of Stress : Not on file  Social Connections:   . Frequency of Communication with Friends and Family: Not on file  . Frequency of Social Gatherings with Friends and Family: Not on file  . Attends Religious Services: Not on file  . Active Member of Clubs or Organizations: Not on file  . Attends Archivist Meetings: Not on file  . Marital Status: Not on file      Review of Systems denies fever, headache, chest pain, dyspnea, cough, abdominal/back pain, nausea, vomiting or bleeding  Vital Signs: BP (!) 152/76   Pulse 70   Temp 97.7 F (36.5 C) (Oral)   Resp 20   Ht 5\' 11"  (1.803 m)   Wt 215 lb 13.3 oz (97.9 kg)   SpO2 99%   BMI 30.10 kg/m   Physical Exam awake, alert.  Chest clear to auscultation bilaterally.  Heart with normal rate, occasional ectopy.  Abdomen soft, positive bowel sounds, nontender.  No lower extremity edema.  Imaging: CT Chest W Contrast  Result Date: 02/09/2020 CLINICAL DATA:  Restaging lung cancer. Status post right upper lobe wedge resection. EXAM: CT CHEST WITH CONTRAST TECHNIQUE: Multidetector CT imaging of the chest was performed during intravenous contrast administration. CONTRAST:  88mL OMNIPAQUE IOHEXOL 300 MG/ML  SOLN COMPARISON:  02/08/2019 FINDINGS: Cardiovascular: Heart size appears normal. No pericardial effusion.  Aortic atherosclerosis. Coronary artery calcifications. Mediastinum/Nodes: Normal appearance of the thyroid gland. The trachea appears patent and is midline. Normal appearance of the esophagus. Interval development of mediastinal and right hilar adenopathy: New right paratracheal lymph node measures 2 cm, image 55/2. High right paratracheal lymph node measures 1 cm, image 35/2. Previously 0.5 cm. Subcarinal lymph node measures 1.3 cm, image 71/2. Previously 1.2 cm. 1.6 cm right hilar lymph node is noted, image 67/2. Previously 1.1 cm. Right inferior hilar lymph node measures 1.1 cm, image 87/2. Previously 0.4 cm. Lungs/Pleura: Centrilobular and paraseptal emphysema. Multiple scattered small pulmonary nodules are noted in both lungs: Along the suture line within the paramediastinal right upper lobe there is a new nodule measuring 6 mm, image  50/3. Adjacent subpleural nodule measures 7 mm, image 56/3. Also new from previous exam. Within the anteromedial left upper lobe subpleural nodule measures 8 mm, image 72/3. New from previous exam. Also within the anteromedial left upper lobe is a new 6 mm subpleural nodule, image 76/3. Posterior basal right upper lobe nodule measures 5 mm, image 74/3. New from previous exam. Right middle lobe lung nodule measures 4 mm, image 73/3. New from previous exam. Tiny right lung nodule near the base measures 3 mm and appears new from the previous exam, image 114/3. Posterior left base nodule measures 3 mm, image 137/3. New from previous study. Upper Abdomen: Unchanged small cyst within anterior right lobe of liver measuring 1.1 cm. Gallstones are again noted within the gallbladder. Stable low-attenuation enlargement of the left adrenal gland which is favored to represent adenomatous hyperplasia. Scarring is noted involving the upper pole of right kidney. Musculoskeletal: No chest wall abnormality. No acute or significant osseous findings. IMPRESSION: 1. Interval development of bilateral  pulmonary nodules worrisome for metastatic disease. 2. Interval increase in size of right hilar and mediastinal lymph nodes worrisome for metastatic adenopathy. 3. Coronary artery calcifications noted. 4. Gallstones. Aortic Atherosclerosis (ICD10-I70.0) and Emphysema (ICD10-J43.9). Electronically Signed   By: Kerby Moors M.D.   On: 02/09/2020 14:01   NM PET Image Restag (PS) Skull Base To Thigh  Result Date: 02/20/2020 CLINICAL DATA:  Subsequent treatment strategy for non-small cell lung cancer. History of right upper lobe wedge resection. Interval development of bilateral pulmonary nodules and adenopathy. EXAM: NUCLEAR MEDICINE PET SKULL BASE TO THIGH TECHNIQUE: 11.359 mCi F-18 FDG was injected intravenously. Full-ring PET imaging was performed from the skull base to thigh after the radiotracer. CT data was obtained and used for attenuation correction and anatomic localization. Fasting blood glucose: 90 mg/dl COMPARISON:  Multiple prior chest CTs and a prior PET-CT from 11/24/2016. The most recent chest CT is 02/09/2020 FINDINGS: Mediastinal blood pool activity: SUV max 1.79 Liver activity: SUV max NA NECK: No hypermetabolic lymph nodes in the neck. Incidental CT findings: none CHEST: There are enlarged and hypermetabolic mediastinal lymph nodes. 9 mm upper right paratracheal lymph node on image 93/8 is hypermetabolic with SUV max of 1.82. Right-sided precarinal lymph node measures 11 mm on image 100/3 and the SUV max is 6.07. 10 mm subcarinal node on image 111/3 has an SUV max of 7.03. As demonstrated on the recent CT scan there also multiple small bilateral pulmonary nodules. These are below the limits of sensitivity for PET CT but are very likely metastatic disease. No supraclavicular or axillary adenopathy. There are multiple muscle lesions consistent with muscle metastasis. There is a lesion in the teres minor muscle which has an SUV max of 7.18. Other less hypermetabolic left shoulder girdle lesions  are noted. There is also a lesion in the right shoulder in the anterior deltoid muscle which has an SUV max of 5.73. Incidental CT findings: Stable aortic and coronary artery calcifications. ABDOMEN/PELVIS: Ill-defined 15 mm low-attenuation lesion in segment 2-3 of the liver is hypermetabolic with SUV max of 9.93. There is also a 8 mm lesion in segment 6 far inferiorly which has an SUV max of 4.72. Small left adrenal gland nodule is mildly hypermetabolic with SUV max of 7.16. Small focus of hypermetabolism is noted in the pancreatic head/uncinate process region. Difficult to see lesion on the CT scan. It could represent a pancreatic metastasis or possibly an adjacent lymph node. SUV max is 4.22. Muscle lesions are noted in  the left psoas muscle and also the left paralumbar muscles. Incidental CT findings: Advanced vascular calcifications. 3 cm infrarenal abdominal aortic aneurysm appears stable. Severe sigmoid colon diverticulosis. SKELETON: Diffuse osseous metastatic disease is noted with numerous hypermetabolic skeletal lesions. These include the left humeral head, left scapula, right sternum, thoracic and lumbar vertebral bodies, right iliac bone and left iliac bone. Left humeral head lesion has an SUV max of 3.66. L1 lesion has an SUV max of 4.87. Right upper iliac bone lesion has an SUV max of 6.36. Incidental CT findings: none IMPRESSION: 1. Hypermetabolic mediastinal lymphadenopathy consistent with metastatic disease. There are also several small pulmonary nodules consistent with metastatic pulmonary disease. 2. Two metastatic lesions noted in the liver and a possible metastatic focus in the pancreatic head and in the left adrenal gland. 3. Widespread muscle and skeletal metastasis. Electronically Signed   By: Marijo Sanes M.D.   On: 02/20/2020 16:54    Labs:  CBC: Recent Labs    02/12/20 0941 02/27/20 0904  WBC 8.0 7.2  HGB 14.8 14.2  HCT 44.5 42.7  PLT 211 221    COAGS: No results for  input(s): INR, APTT in the last 8760 hours.  BMP: Recent Labs    02/09/20 1011 02/12/20 0941  NA  --  135  K  --  4.6  CL  --  101  CO2  --  27  GLUCOSE  --  104*  BUN  --  15  CALCIUM  --  8.9  CREATININE 1.10 0.95  GFRNONAA  --  >60    LIVER FUNCTION TESTS: Recent Labs    02/12/20 0941  BILITOT 0.7  AST 28  ALT 24  ALKPHOS 51  PROT 7.7  ALBUMIN 4.0    TUMOR MARKERS: No results for input(s): AFPTM, CEA, CA199, CHROMGRNA in the last 8760 hours.  Assessment and Plan: 74 y.o. male , ex-smoker, with history of laryngeal carcinoma in 2010 and right lung adenocarcinoma with prior wedge resection of a right upper lobe mass in 2018.  Recent PET scan has revealed hypermetabolic mediastinal lymphadenopathy as well as several small pulmonary nodules consistent with metastatic pulmonary disease .  There are also 2 metastatic lesions noted in the liver and a possible metastatic focus in the pancreatic head and in the left adrenal gland as well as widespread muscle and skeletal metastases.  He presents today for image guided liver lesion versus muscle lesion biopsy.Risks and benefits of procedure was discussed with the patient  including, but not limited to bleeding, infection, damage to adjacent structures or low yield requiring additional tests.  All of the questions were answered and there is agreement to proceed.  Consent signed and in chart.    Thank you for this interesting consult.  I greatly enjoyed meeting Curtis B Zacchary Pompei. and look forward to participating in their care.  A copy of this report was sent to the requesting provider on this date.  Electronically Signed: D. Rowe Robert, PA-C 02/27/2020, 9:23 AM   I spent a total of 25 minutes    in face to face in clinical consultation, greater than 50% of which was counseling/coordinating care for image guided liver lesion versus muscle lesion biopsy

## 2020-02-29 ENCOUNTER — Other Ambulatory Visit: Payer: Self-pay | Admitting: Anatomic Pathology & Clinical Pathology

## 2020-02-29 LAB — SURGICAL PATHOLOGY

## 2020-03-04 ENCOUNTER — Other Ambulatory Visit: Payer: Self-pay

## 2020-03-04 ENCOUNTER — Inpatient Hospital Stay: Payer: PPO | Attending: Internal Medicine | Admitting: Internal Medicine

## 2020-03-04 ENCOUNTER — Ambulatory Visit: Payer: PPO | Admitting: Internal Medicine

## 2020-03-04 DIAGNOSIS — C7951 Secondary malignant neoplasm of bone: Secondary | ICD-10-CM | POA: Diagnosis not present

## 2020-03-04 DIAGNOSIS — Z87891 Personal history of nicotine dependence: Secondary | ICD-10-CM | POA: Insufficient documentation

## 2020-03-04 DIAGNOSIS — C7931 Secondary malignant neoplasm of brain: Secondary | ICD-10-CM | POA: Diagnosis not present

## 2020-03-04 DIAGNOSIS — Z7982 Long term (current) use of aspirin: Secondary | ICD-10-CM | POA: Insufficient documentation

## 2020-03-04 DIAGNOSIS — I714 Abdominal aortic aneurysm, without rupture: Secondary | ICD-10-CM | POA: Insufficient documentation

## 2020-03-04 DIAGNOSIS — C3411 Malignant neoplasm of upper lobe, right bronchus or lung: Secondary | ICD-10-CM | POA: Diagnosis not present

## 2020-03-04 DIAGNOSIS — C787 Secondary malignant neoplasm of liver and intrahepatic bile duct: Secondary | ICD-10-CM | POA: Insufficient documentation

## 2020-03-04 DIAGNOSIS — Z8521 Personal history of malignant neoplasm of larynx: Secondary | ICD-10-CM | POA: Insufficient documentation

## 2020-03-04 DIAGNOSIS — Z7952 Long term (current) use of systemic steroids: Secondary | ICD-10-CM | POA: Diagnosis not present

## 2020-03-04 DIAGNOSIS — Z79899 Other long term (current) drug therapy: Secondary | ICD-10-CM | POA: Insufficient documentation

## 2020-03-04 NOTE — Assessment & Plan Note (Addendum)
#   Stage IV/recurrent adenocarcinoma of the lung;  NOV 24th PET-  Interval increase in size of right hilar and Hypermetabolic mediastinal lymphadenopathy consistent with metastatic disease;  small pulmonary nodules consistent with metastatic pulmonary disease ; Two metastatic lesions noted in the liver and a possible metastatic focus in the pancreatic head and in the left adrenal gland; Widespread muscle and skeletal metastasis. Biopsy of Left subcutanoues nodule-positive for metastatic adeno lung. Awaiting on Molecular testing.  #I reviewed with the patient and family-the pathology and stage of cancer in detail.  Understands treatments are not curative; only palliative.  #Discussed the treatment options include-chemoimmunotherapy versus chemotherapy versus single agent chemotherapy versus targeted therapy.  The treatment options to be decided based upon results of the molecular testing.  Patient is not currently very symptomatic.  Recommend MRI of the brain.  #  Bone lesions-mildly symptomatic; recommend Zometa.  Discussed that Zometa would help with skeletal related events.  Discussed the potential side effects including but not limited to hypocalcemia; osteonecrosis of the jaw.  However benefits of the treatment outweigh the risk.  Patient interested.  *will call with results- [phone] # DISPOSITION:  # MRI Brain-  # bmp; Zometa infusion later this week. # follow up TBD-Dr.B  # 40 minutes face-to-face with the patient discussing the above plan of care; more than 50% of time spent on prognosis/ natural history; counseling and coordination.   Cc: Dr.Hedrrick.

## 2020-03-04 NOTE — Progress Notes (Signed)
Pt and wife in for follow up and test results.  Pt denies any pain but states did pain all over and in hips earlier today.  Relieved with motrin.

## 2020-03-04 NOTE — Progress Notes (Signed)
[facility] NOTE  Patient Care Team: [name], MD as PCP - General (Family Medicine) [name], MD as Consulting Physician (Family Medicine) [name], [name], MD (General Surgery)  CHIEF COMPLAINTS/PURPOSE OF CONSULTATION: Lung cancer  #  Oncology History Overview Note  # OCT 2018- STAGE I- A. 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  - PARENCHYMAL MARGIN APPEARS CLEAR; TUMOR WAS 1 CM FROM MARGIN BEFORE REMOVAL OF STAPLE LINE.; NO adjuvant therapy.   # DEC 2021-- METASTATIC CARCINOMA, COMPATIBLE WITH PULMONARY ADENOCARCINOMA [left shoulder Core Biopsy]; NGS- pending.   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  Short course of Eliquis.smoking-quit 2008.  ---------------------------------------    DIAGNOSIS: LUNG CA  STAGE:  I       ;GOALS: cure  CURRENT/MOST RECENT THERAPY: surveillaince    Primary cancer of right upper lobe of lung (HCC)     HISTORY OF PRESENTING ILLNESS:  Curtis Andrade. 73 y.o.  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  Patient noted to have mild achy joints starting yesterday.  Improved after taking Motrin.  Otherwise he continues to be fairly active at the [facility].  Continues to work.  Denies any headaches.  Denies any nausea vomiting.  Denies any new lumps or bumps.    Review of Systems  Constitutional: Negative for chills, diaphoresis, fever, malaise/fatigue and weight loss.  HENT: Negative for nosebleeds and sore throat.   Eyes: Negative for double vision.  Respiratory: Negative for cough, hemoptysis, sputum production, shortness of breath and wheezing.   Cardiovascular: Negative for chest pain, palpitations, orthopnea and  leg swelling.  Gastrointestinal: Negative for abdominal pain, blood in stool, constipation, diarrhea, heartburn, melena, nausea and vomiting.  Genitourinary: Negative for dysuria, frequency and urgency.  Musculoskeletal: Positive for back pain and joint pain.  Skin: Negative.  Negative for itching and rash.  Neurological: Negative for dizziness, tingling, focal weakness, weakness and headaches.  Endo/Heme/Allergies: Does not bruise/bleed easily.  Psychiatric/Behavioral: Negative for depression. The patient is not nervous/anxious and does not have insomnia.      MEDICAL HISTORY:  Past Medical History:  Diagnosis Date  . 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 Colon polyp 2013  . Hemorrhoids    pt denies  . 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 Baruch Gouty and Dr. Nadeen Landau  . Phlebitis 1998  . Primary cancer of right upper lobe of lung ([place]) 01/14/2017   Dr. Genevive Bi performed a wedge resection of RUL.   . 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/27/2016    SURGICAL HISTORY: Past Surgical History:  Procedure Laterality Date  . COLONOSCOPY  2013   Dr Vira Agar  . COLONOSCOPY WITH PROPOFOL N/A 12/23/2016   Procedure: COLONOSCOPY WITH PROPOFOL;  Surgeon: Robert Bellow, MD;  Location: [facility] END[facility];  Service: Endoscopy;  Laterality: N/A;  . EYE SURGERY    . HERNIA REPAIR Right 1990's  . TENDON REPAIR Left 03/14/2019   Procedure: TENDON REPAIR AND GRAFT  TRANSFER EXTENSOR INDICIS PROPRIUS LEFT THUMB;  Surgeon: [name], MD;  Location: [facility];  Service: Orthopedics;  Laterality: Left;  AXILLARY BLOCK  . THORACOTOMY Right 01/14/2017   Procedure:  RIGHT THORACOSCOPYWITH WIDE WEDGE RESECTION,  PREOP BROCHOSCOPY;  Surgeon: [name], MD;  Location: [facility] ORS;  Service: General;  Laterality: Right;  . THROAT SURGERY  2010    SOCIAL HISTORY: Social History   Socioeconomic  History  . Marital status: Married    Spouse name: Not on file  . Number of children: Not on file  . Years of education: Not on file  . Highest education level: Not on file  Occupational History  . Not on file  Tobacco Use  . Smoking status: Former Smoker    Years: 50.00    Quit date: 03/31/2007    Years since quitting: 12.9  . Smokeless tobacco: Never Used  Vaping Use  . Vaping Use: Never used  Substance and Sexual Activity  . Alcohol use: No  . Drug use: No  . Sexual activity: Not on file  Other Topics Concern  . Not on file  Social History Narrative  . Not on file   Social Determinants of Health   Financial Resource Strain:   . Difficulty of Paying Living Expenses: Not on file  Food Insecurity:   . Worried About Charity fundraiser in the Last Year: Not on file  . Ran Out of Food in the Last Year: Not on file  Transportation Needs:   . Lack of Transportation (Medical): Not on file  . Lack of Transportation (Non-Medical): Not on file  Physical Activity:   . Days of Exercise per Week: Not on file  . Minutes of Exercise per Session: Not on file  Stress:   . Feeling of Stress : Not on file  Social Connections:   . Frequency of Communication with Friends and Family: Not on file  . Frequency of Social Gatherings with Friends and Family: Not on file  . Attends Religious Services: Not on file  . Active Member of Clubs or Organizations: Not on file  . Attends Archivist Meetings: Not on file  . Marital Status: Not on file  Intimate Partner Violence:   . Fear of Current or Ex-Partner: Not on file  . Emotionally Abused: Not on file  . Physically Abused: Not on file  . Sexually Abused: Not on file    FAMILY HISTORY: Family History  Problem Relation Age of Onset  . Alzheimer's disease Mother   . Alzheimer's disease Father   . Stroke Father   . Colon cancer Neg Hx     ALLERGIES:  has No Known Allergies.  MEDICATIONS:  Current Outpatient Medications   Medication Sig Dispense Refill  . Ascorbic Acid (VITAMIN C) 1000 MG tablet Take 1,000 mg by mouth daily.    Marland Kitchen aspirin EC 81 MG tablet Take 81 mg by mouth daily.    . folic acid (FOLVITE) 259 MCG tablet Take 800 mcg by mouth daily.    . Glucosamine-Chondroitin (COSAMIN DS PO) Take 1 tablet by mouth daily.    . Multiple Vitamin (MULTIVITAMIN WITH MINERALS) TABS tablet Take 1 tablet by mouth daily. Senior Multivitamin.    . Omega-3 1000 MG CAPS Take 1 tablet by mouth 1 day or 1 dose.    Marland Kitchen omeprazole (PRILOSEC) 40 MG capsule Take 40 mg by mouth daily before supper.     . vitamin E 400 UNIT capsule Take 1 capsule by mouth 1 day or 1 dose.     No current facility-administered medications for this visit.      Marland Kitchen  PHYSICAL EXAMINATION: ECOG PERFORMANCE STATUS: 0 - Asymptomatic  Vitals:   03/04/20 1450  BP: 136/77  Pulse: 94  Resp: 18  Temp: 97.9 F (36.6 C)   Filed Weights   03/04/20 1450  Weight: 213 lb 12.8 oz (97 kg)    Physical Exam Constitutional:      Comments: Ambulating by independently.  Accompanied by his wife.  His 2 daughters over the phone/FaceTime.  HENT:     Head: Normocephalic and atraumatic.     Mouth/Throat:     Pharynx: No oropharyngeal exudate.  Eyes:     Pupils: Pupils are equal, round, and reactive to light.  Cardiovascular:     Rate and Rhythm: Normal rate and regular rhythm.  Pulmonary:     Effort: No respiratory distress.     Breath sounds: No wheezing.  Abdominal:     General: Bowel sounds are normal. There is no distension.     Palpations: Abdomen is soft. There is no mass.     Tenderness: There is no abdominal tenderness. There is no guarding or rebound.  Musculoskeletal:        General: No tenderness. Normal range of motion.     Cervical back: Normal range of motion and neck supple.  Skin:    General: Skin is warm.  Neurological:     Mental Status: He is alert and oriented to person, place, and time.  Psychiatric:        Mood and Affect:  Affect normal.   ;  LABORATORY DATA:  I have reviewed the data as listed Lab Results  Component Value Date   WBC 7.2 02/27/2020   HGB 14.2 02/27/2020   HCT 42.7 02/27/2020   MCV 95.7 02/27/2020   PLT 221 02/27/2020   Recent Labs    02/09/20 1011 02/12/20 0941  NA  --  135  K  --  4.6  CL  --  101  CO2  --  27  GLUCOSE  --  104*  BUN  --  15  CREATININE 1.10 0.95  CALCIUM  --  8.9  GFRNONAA  --  >60  PROT  --  7.7  ALBUMIN  --  4.0  AST  --  28  ALT  --  24  ALKPHOS  --  51  BILITOT  --  0.7    RADIOGRAPHIC STUDIES: I have personally reviewed the radiological images as listed and agreed with the findings in the report. CT Chest W Contrast  Result Date: 02/09/2020 CLINICAL DATA:  Restaging lung cancer. Status post right upper lobe wedge resection. EXAM: CT CHEST WITH CONTRAST TECHNIQUE: Multidetector CT imaging of the chest was performed during intravenous contrast administration. CONTRAST:  44mL OMNIPAQUE IOHEXOL 300 MG/ML  SOLN COMPARISON:  02/08/2019 FINDINGS: Cardiovascular: Heart size appears normal. No pericardial effusion. Aortic atherosclerosis. Coronary artery calcifications. Mediastinum/Nodes: Normal appearance of the thyroid gland. The trachea appears patent and is midline. Normal appearance of the esophagus. Interval development of mediastinal and right hilar adenopathy: New right paratracheal lymph node measures 2 cm, image 55/2. High right paratracheal lymph node measures 1 cm, image 35/2. Previously 0.5 cm. Subcarinal lymph node measures 1.3 cm, image 71/2. Previously 1.2 cm. 1.6 cm right hilar lymph node is noted, image 67/2. Previously 1.1 cm. Right inferior hilar lymph node measures 1.1 cm, image 87/2. Previously 0.4 cm. Lungs/Pleura: Centrilobular and paraseptal emphysema. Multiple scattered small pulmonary nodules are noted in both lungs: Along the suture line within the paramediastinal right upper lobe there is a new nodule measuring 6 mm, image 50/3.  Adjacent subpleural nodule measures 7 mm, image 56/3. Also new from previous exam. Within the anteromedial left upper lobe subpleural nodule measures 8 mm, image 72/3. New from previous exam. Also within the anteromedial left upper lobe is a new 6 mm subpleural nodule, image 76/3. Posterior basal right upper lobe nodule measures 5 mm, image 74/3. New from previous exam. Right middle lobe lung nodule measures 4 mm, image 73/3. New from previous exam. Tiny right lung nodule near the base measures 3 mm and appears new from the previous exam, image 114/3. Posterior left base nodule measures 3 mm, image 137/3. New from previous study. Upper Abdomen: Unchanged small cyst within anterior right lobe of liver measuring 1.1 cm. Gallstones are again noted within the gallbladder. Stable low-attenuation enlargement of the left adrenal gland which is favored to represent adenomatous hyperplasia. Scarring is noted involving the upper pole of right kidney. Musculoskeletal: No chest wall abnormality. No acute or significant osseous findings. IMPRESSION: 1. Interval development of bilateral pulmonary nodules worrisome for metastatic disease. 2. Interval increase in size of right hilar and mediastinal lymph nodes worrisome for metastatic adenopathy. 3. Coronary artery calcifications noted. 4. Gallstones. Aortic Atherosclerosis (ICD10-I70.0) and Emphysema (ICD10-J43.9). Electronically Signed   By: Kerby Moors M.D.   On: 02/09/2020 14:01   NM PET Image Restag (PS) Skull Base To Thigh  Result Date: 02/20/2020 CLINICAL DATA:  Subsequent treatment strategy for non-small cell lung cancer. History of right upper lobe wedge resection. Interval development of bilateral pulmonary nodules and adenopathy. EXAM: NUCLEAR MEDICINE PET SKULL BASE TO THIGH TECHNIQUE: 11.359 mCi F-18 FDG was injected intravenously. Full-ring PET imaging was performed from the skull base to thigh after the radiotracer. CT data was obtained and used for  attenuation correction and anatomic localization. Fasting blood glucose: 90 mg/dl COMPARISON:  Multiple prior chest CTs and a prior PET-CT from 11/24/2016. The most recent chest CT is 02/09/2020 FINDINGS: Mediastinal blood pool activity: SUV max 1.79 Liver activity: SUV max NA NECK: No hypermetabolic lymph nodes in the neck. Incidental CT findings: none CHEST: There are enlarged and hypermetabolic mediastinal lymph nodes. 9 mm upper right paratracheal lymph node on image 67/6 is hypermetabolic with SUV max of 7.20. Right-sided precarinal lymph node measures 11 mm on image 100/3 and the SUV max is 6.07. 10 mm subcarinal node on image 111/3 has an SUV max of 7.03. As demonstrated on the recent CT scan there also multiple small bilateral pulmonary nodules. These are below the limits of sensitivity for PET CT but are very likely metastatic disease. No supraclavicular or axillary adenopathy. There are multiple muscle lesions consistent with muscle metastasis. There is a lesion in the teres minor muscle which has an SUV max of 7.18. Other less hypermetabolic left shoulder girdle lesions are noted. There is also a lesion in the right shoulder in the anterior deltoid muscle which has an SUV max of 5.73. Incidental CT findings: Stable aortic and coronary artery calcifications. ABDOMEN/PELVIS: Ill-defined 15 mm low-attenuation lesion in segment 2-3 of the liver is hypermetabolic with SUV max of 9.47. There is also a 8 mm lesion in segment 6 far inferiorly which has an SUV max of 4.72. Small left adrenal gland nodule is mildly hypermetabolic with SUV max of 0.96. Small focus of hypermetabolism is noted in the pancreatic head/uncinate process region. Difficult to see lesion on the CT scan. It could represent a pancreatic metastasis or possibly an adjacent lymph node. SUV max is 4.22. Muscle lesions are noted in the left  psoas muscle and also the left paralumbar muscles. Incidental CT findings: Advanced vascular calcifications.  3 cm infrarenal abdominal aortic aneurysm appears stable. Severe sigmoid colon diverticulosis. SKELETON: Diffuse osseous metastatic disease is noted with numerous hypermetabolic skeletal lesions. These include the left humeral head, left scapula, right sternum, thoracic and lumbar vertebral bodies, right iliac bone and left iliac bone. Left humeral head lesion has an SUV max of 3.66. L1 lesion has an SUV max of 4.87. Right upper iliac bone lesion has an SUV max of 6.36. Incidental CT findings: none IMPRESSION: 1. Hypermetabolic mediastinal lymphadenopathy consistent with metastatic disease. There are also several small pulmonary nodules consistent with metastatic pulmonary disease. 2. Two metastatic lesions noted in the liver and a possible metastatic focus in the pancreatic head and in the left adrenal gland. 3. Widespread muscle and skeletal metastasis. Electronically Signed   By: Marijo Sanes M.D.   On: 02/20/2020 16:54   [place] CORE BIOPSY (SOFT TISSUE)  Result Date: 02/27/2020 INDICATION: 74 year old with history of lung and laryngeal cancer. Patient has evidence of metastatic disease based on recent PET-CT and patient needs a tissue diagnosis. Patient has a hypermetabolic muscular lesion posterior to the left scapula. EXAM: ULTRASOUND-GUIDED CORE BIOPSY OF A SOFT TISSUE LESION IN THE LEFT POSTERIOR CHEST MEDICATIONS: None. ANESTHESIA/SEDATION: Moderate (conscious) sedation was employed during this procedure. A total of Versed 1.0 mg and Fentanyl 50 mcg was administered intravenously. Moderate Sedation Time: 11 minutes. The patient's level of consciousness and vital signs were monitored continuously by radiology nursing throughout the procedure under my direct supervision. FLUOROSCOPY TIME:  Fluoroscopy Time: None COMPLICATIONS: None immediate. PROCEDURE: Informed written consent was obtained from the patient after a thorough discussion of the procedural risks, benefits and alternatives. All questions were  addressed. A timeout was performed prior to the initiation of the procedure. Area of concern is the left posterior chest. Hypoechoic intramuscular lesion was identified. This finding corresponds with the recent PET-CT findings. The overlying skin was prepped with chlorhexidine and sterile field was created. Skin and soft tissues were anesthetized using 1% lidocaine. Small incision was made. Using ultrasound guidance, 17 gauge coaxial needle was directed into the lesion. Total of 5 core biopsies were obtained with an 18 gauge core device. Specimens placed in formalin. 17 gauge needle was removed without complication. Bandage placed over the puncture site. FINDINGS: Hypoechoic lesion posterior to left scapula. Lesion measures roughly 4.5 x 1.4 x 2.2 cm. Five adequate core specimens were obtained. No immediate bleeding or hematoma formation. IMPRESSION: Successful ultrasound guided core biopsies to a soft tissue lesion in the left posterior chest musculature. Electronically Signed   By: Markus Daft M.D.   On: 02/27/2020 12:03    ASSESSMENT & PLAN:   Primary cancer of right upper lobe of lung ([place]) # Stage IV/recurrent adenocarcinoma of the lung;  NOV 24th PET-  Interval increase in size of right hilar and Hypermetabolic mediastinal lymphadenopathy consistent with metastatic disease;  small pulmonary nodules consistent with metastatic pulmonary disease ; Two metastatic lesions noted in the liver and a possible metastatic focus in the pancreatic head and in the left adrenal gland; Widespread muscle and skeletal metastasis. Biopsy of Left subcutanoues nodule-positive for metastatic adeno lung. Awaiting on Molecular testing.  #I reviewed with the patient and family-the pathology and stage of cancer in detail.  Understands treatments are not curative; only palliative.  #Discussed the treatment options include-chemoimmunotherapy versus chemotherapy versus single agent chemotherapy versus targeted therapy.  The  treatment options to be  decided based upon results of the molecular testing.  Patient is not currently very symptomatic.  Recommend MRI of the brain.  #  Bone lesions-mildly symptomatic; recommend Zometa.  Discussed that Zometa would help with skeletal related events.  Discussed the potential side effects including but not limited to hypocalcemia; osteonecrosis of the jaw.  However benefits of the treatment outweigh the risk.  Patient interested.  *will call with results- [phone] # DISPOSITION:  # MRI Brain-  # bmp; Zometa infusion later this week. # follow up TBD-Dr.B  # 40 minutes face-to-face with the patient discussing the above plan of care; more than 50% of time spent on prognosis/ natural history; counseling and coordination.   Cc: Dr.Hedrrick.    All questions were answered. The patient knows to call the clinic with any problems, questions or concerns.    Cammie Sickle, MD 03/04/2020 5:05 PM

## 2020-03-05 ENCOUNTER — Telehealth: Payer: Self-pay | Admitting: *Deleted

## 2020-03-05 NOTE — Telephone Encounter (Signed)
Patient called asking if the appointment with Dr Sanjuana Mae is still needed. Please advise

## 2020-03-05 NOTE — Telephone Encounter (Signed)
Spoke with pt in regards and advised per Dr. Jacinto Reap ok to cancel the appointment with Dr. Sanjuana Mae. Pt expressed understanding.

## 2020-03-06 ENCOUNTER — Telehealth: Payer: Self-pay | Admitting: *Deleted

## 2020-03-06 NOTE — Telephone Encounter (Signed)
Spoke with patient. He has an episode of diarrhea last evening. He wanted to make sure it was ok. No other diarrhea since this episode. He has no other symptoms. He denies any covid exposures. He has had 3 covid vaccines. RN Spoke with Dr. Rogue Bussing. Ok to proceed with Zometa infusion as scheduled.

## 2020-03-06 NOTE — Telephone Encounter (Signed)
Patient called stating that he is to have an infusion for his bones, and that he is just getting over a stomach bug and is asking if the infusion will interfere with that. Please advise

## 2020-03-07 ENCOUNTER — Inpatient Hospital Stay: Payer: PPO

## 2020-03-07 ENCOUNTER — Other Ambulatory Visit: Payer: Self-pay

## 2020-03-07 VITALS — BP 162/74 | HR 75

## 2020-03-07 DIAGNOSIS — C7951 Secondary malignant neoplasm of bone: Secondary | ICD-10-CM | POA: Diagnosis not present

## 2020-03-07 DIAGNOSIS — C3411 Malignant neoplasm of upper lobe, right bronchus or lung: Secondary | ICD-10-CM

## 2020-03-07 MED ORDER — ZOLEDRONIC ACID 4 MG/100ML IV SOLN
4.0000 mg | Freq: Once | INTRAVENOUS | Status: AC
  Administered 2020-03-07: 4 mg via INTRAVENOUS
  Filled 2020-03-07: qty 100

## 2020-03-07 MED ORDER — SODIUM CHLORIDE 0.9 % IV SOLN
Freq: Once | INTRAVENOUS | Status: AC
  Filled 2020-03-07: qty 250

## 2020-03-07 NOTE — Progress Notes (Signed)
MD ok to use labs from 11/15 and 11/30 to give zometa today

## 2020-03-07 NOTE — Progress Notes (Signed)
Ok per MD to proceed with Zometa with labs from 11/15 and 11/30.

## 2020-03-11 ENCOUNTER — Telehealth: Payer: Self-pay | Admitting: Internal Medicine

## 2020-03-11 ENCOUNTER — Telehealth: Payer: Self-pay | Admitting: *Deleted

## 2020-03-11 NOTE — Telephone Encounter (Addendum)
Patient called reporting that after his Zometa Thursday that he started having pain in his arm where he got his infusion which has now subsided. He awoke Friday with a "crick" in his neck and joint pain for which he took Ibuprofen which helped, but he still has some pain in his neck and he has changed from ibuprofen to Tylenol. He was asking if this is side effects form Zometa. I advised him of the side effects of Zometa which include flu like symptoms which can last 2 days to 2 weeks. Please advise if there are any new orders.

## 2020-03-11 NOTE — Telephone Encounter (Signed)
On 12/13; I called patient regarding his concerns for musculoskeletal symptoms/pain post Zometa infusion.  Currently is on NSAIDs.  Hayley-please follow-up on NGS/foundation-1 testing.

## 2020-03-11 NOTE — Telephone Encounter (Signed)
Dr. B will call the patient to further discuss

## 2020-03-12 NOTE — Telephone Encounter (Signed)
ETA for Foundation One testing is 12/22

## 2020-03-13 ENCOUNTER — Encounter: Payer: Self-pay | Admitting: Internal Medicine

## 2020-03-15 ENCOUNTER — Encounter: Payer: Self-pay | Admitting: Internal Medicine

## 2020-03-16 ENCOUNTER — Encounter: Payer: Self-pay | Admitting: Internal Medicine

## 2020-03-16 DIAGNOSIS — C3411 Malignant neoplasm of upper lobe, right bronchus or lung: Secondary | ICD-10-CM | POA: Diagnosis not present

## 2020-03-17 ENCOUNTER — Other Ambulatory Visit: Payer: Self-pay

## 2020-03-17 ENCOUNTER — Ambulatory Visit
Admission: RE | Admit: 2020-03-17 | Discharge: 2020-03-17 | Disposition: A | Discharge status: Home / Self Care | Payer: PPO | Source: Ambulatory Visit | Attending: Internal Medicine | Admitting: Internal Medicine

## 2020-03-17 DIAGNOSIS — J321 Chronic frontal sinusitis: Secondary | ICD-10-CM | POA: Diagnosis not present

## 2020-03-17 DIAGNOSIS — G9389 Other specified disorders of brain: Secondary | ICD-10-CM | POA: Diagnosis not present

## 2020-03-17 DIAGNOSIS — J0110 Acute frontal sinusitis, unspecified: Secondary | ICD-10-CM | POA: Diagnosis not present

## 2020-03-17 DIAGNOSIS — J322 Chronic ethmoidal sinusitis: Secondary | ICD-10-CM | POA: Diagnosis not present

## 2020-03-17 DIAGNOSIS — C3411 Malignant neoplasm of upper lobe, right bronchus or lung: Secondary | ICD-10-CM | POA: Diagnosis not present

## 2020-03-17 IMAGING — MR MR HEAD WO/W CM
14 series · 47 of 48 positions shown · IV contrast (10ml Gadavist)
Comparison: None.

CLINICAL DATA: Cell cancer.  Pretreatment staging.

EXAM:
MRI HEAD WITHOUT AND WITH CONTRAST
TECHNIQUE: Multiplanar, multiecho pulse sequences of the brain and surrounding
structures were obtained without and with intravenous contrast.
CONTRAST:  10mL GADAVIST GADOBUTROL 1 MMOL/ML IV SOLN

[Series 5: ax dwi_tracew · axial · 3.0mm · 0.60mm/px · z∈[-74,+77]mm · 3 of 48 slices shown]
[im 1/48]
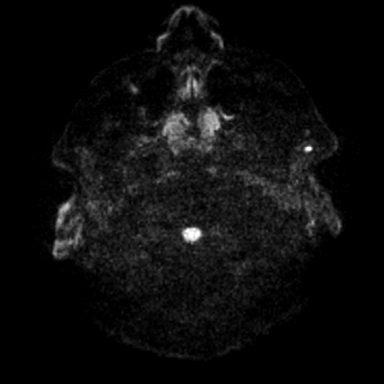
[im 24/48]
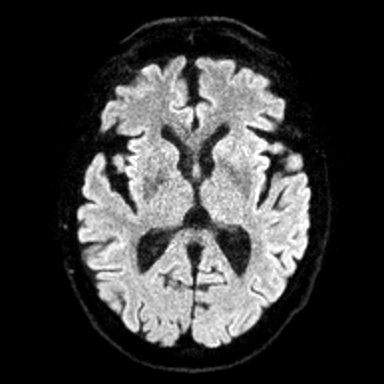
[im 48/48]
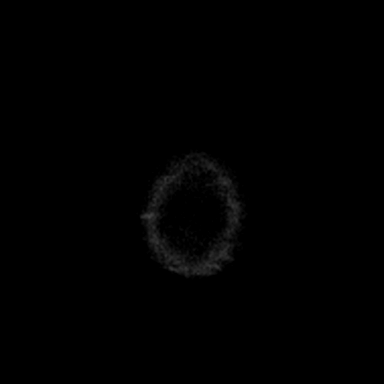

[Series 6: ax dwi_adc · axial · 3.0mm · 0.60mm/px · z∈[-74,+77]mm · 3 of 48 slices shown]
[im 1/48]
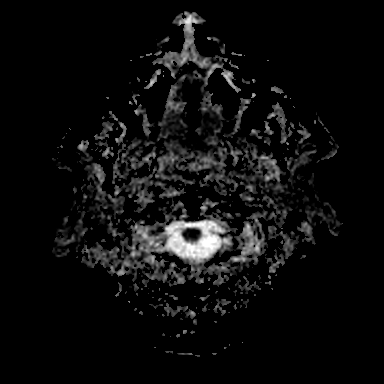
[im 24/48]
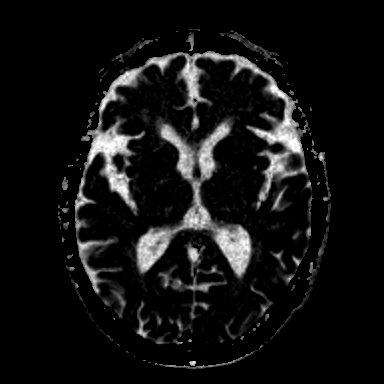
[im 48/48]
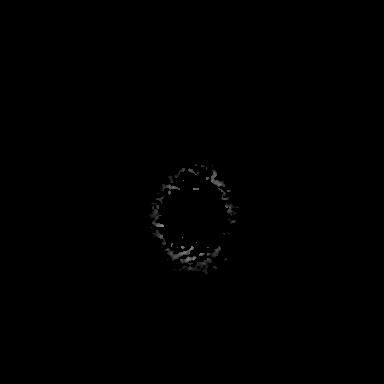

[Series 7: cor dwi_tracew · coronal · 5.0mm · 0.68mm/px · 2 of 42 slices shown]
[im 1/42]
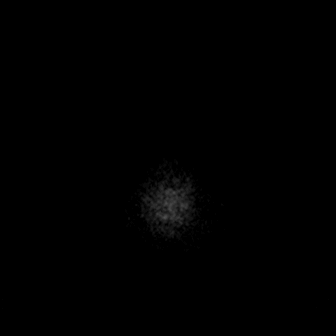
[im 42/42]
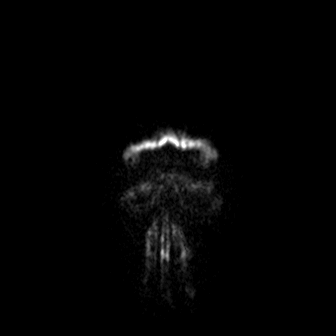

[Series 8: cor dwi_adc · coronal · 5.0mm · 0.68mm/px · 2 of 42 slices shown]
[im 1/42]
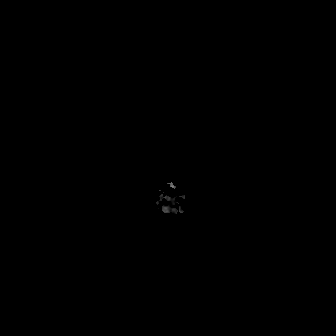
[im 42/42]
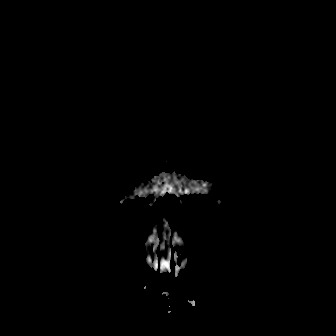

[Series 9: T1 · sagittal · 5.0mm · 0.62mm/px · 1 of 25 slices shown (1 of 2)]
[im 1/25]
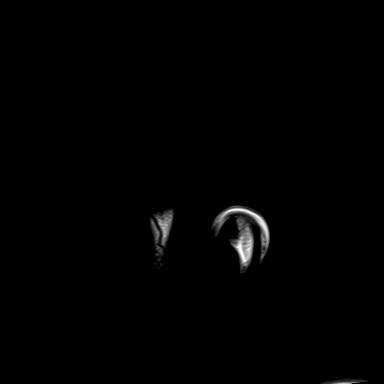

[Series 10: T2 · axial · 5.0mm · 0.53mm/px · 1 of 27 slices shown]
[im 1/27]
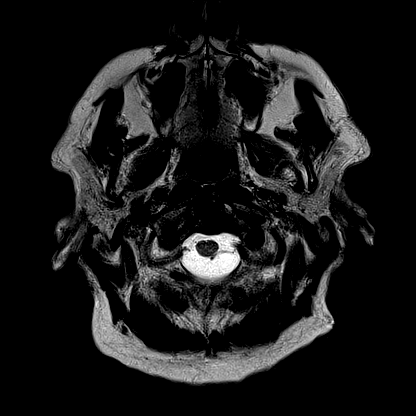

[Series 12: pha_images · axial · 3.0mm · 0.90mm/px · z∈[-85,+76]mm · 3 of 56 slices shown]
[im 1/56]
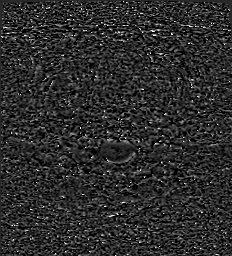
[im 28/56]
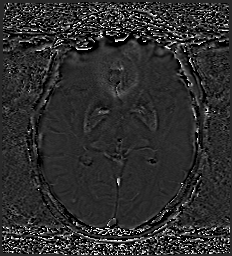
[im 56/56]
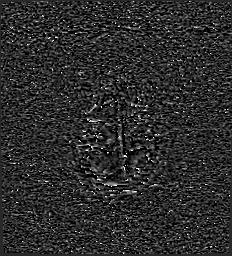

[Series 13: swi_images · axial · 3.0mm · 0.90mm/px · z∈[-85,+88]mm · 3 of 60 slices shown]
[im 1/60]
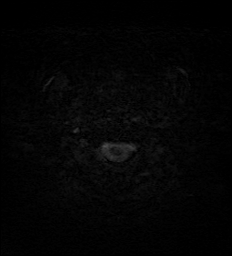
[im 30/60]
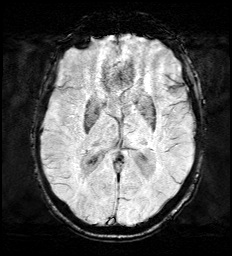
[im 60/60]
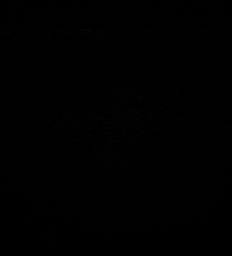

[Series 15: FLAIR · axial · 3.0mm · 0.53mm/px · z∈[-79,+80]mm · 3 of 55 slices shown]
[im 1/55]
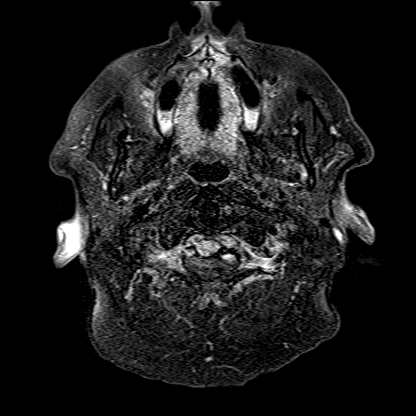
[im 28/55]
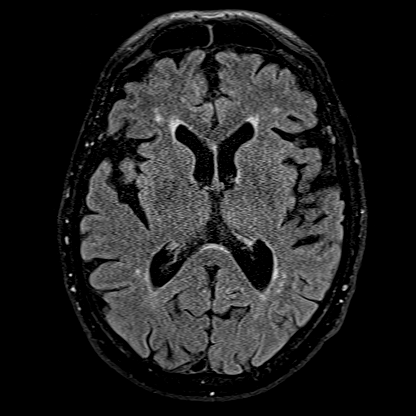
[im 55/55]
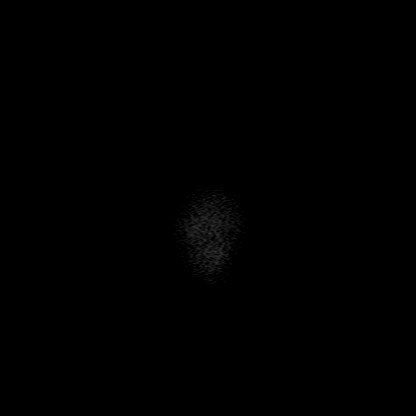

[Series 16: T1 · axial · 1.0mm · 0.98mm/px · z∈[-92,+95]mm · 10 of 192 slices shown (2 of 2)]
[im 1/192]
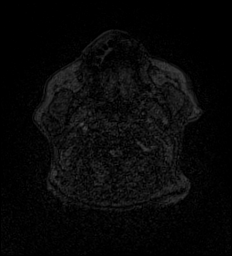
[im 20/192]
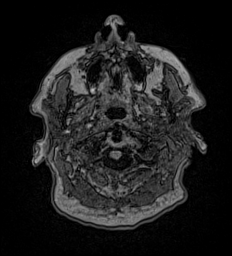
[im 39/192]
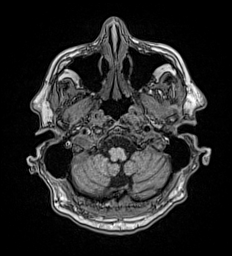
[im 58/192]
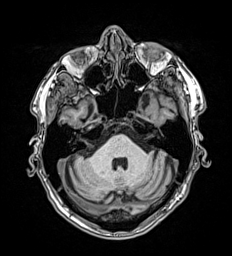
[im 77/192]
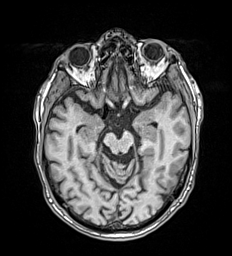
[im 96/192]
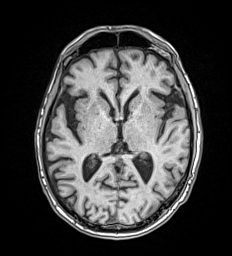
[im 115/192]
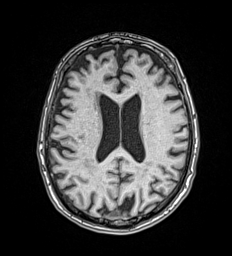
[im 134/192]
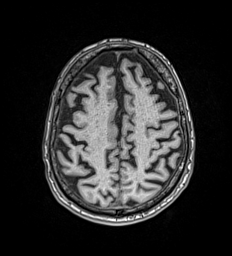
[im 153/192]
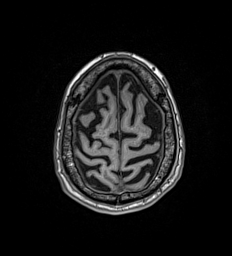
[im 192/192]
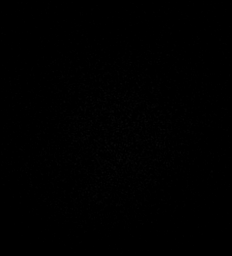

[Series 17: T2 post-contrast · coronal · 5.0mm · 0.57mm/px · 2 of 31 slices shown]
[im 1/31]
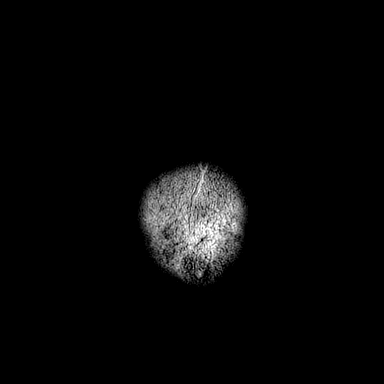
[im 31/31]
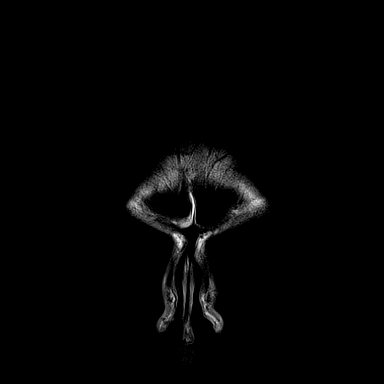

[Series 18: T1 post-contrast · axial · 1.0mm · 0.98mm/px · z∈[-92,+95]mm · 11 of 192 slices shown (1 of 3)]
[im 1/192]
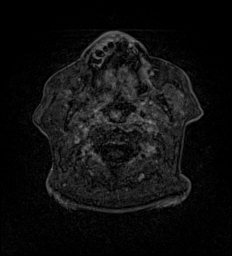
[im 20/192]
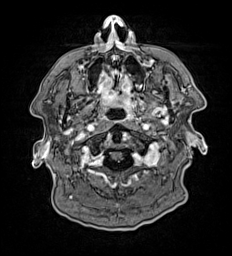
[im 39/192]
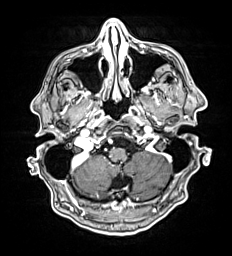
[im 58/192]
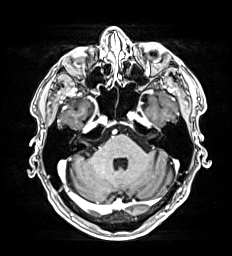
[im 77/192]
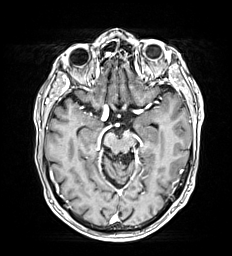
[im 96/192]
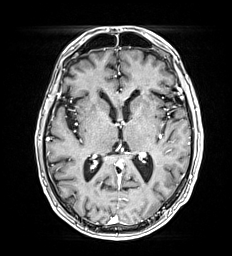
[im 115/192]
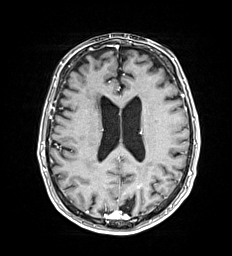
[im 134/192]
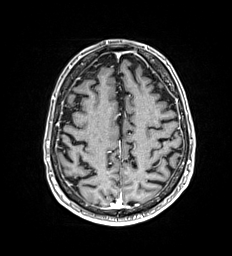
[im 153/192]
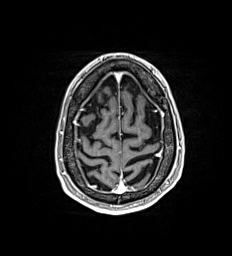
[im 172/192]
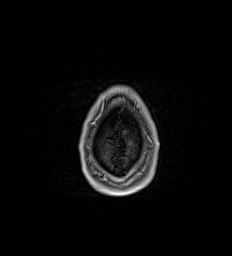
[im 192/192]
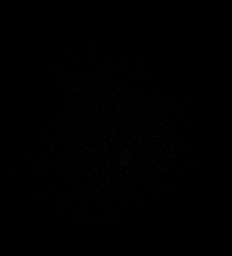

[Series 19: T1 post-contrast · coronal · 5.0mm · 0.57mm/px · 2 of 31 slices shown (2 of 3)]
[im 1/31]
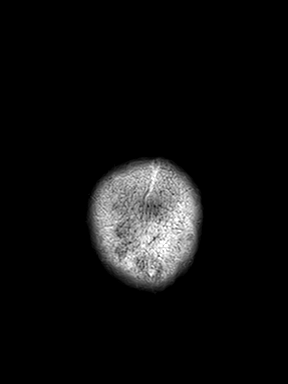
[im 31/31]
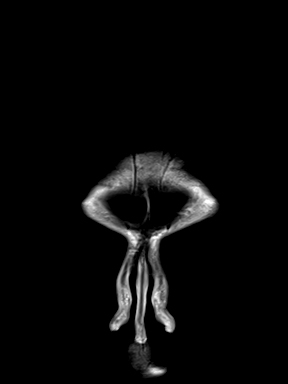

[Series 20: T1 post-contrast · sagittal · 5.0mm · 0.62mm/px · 1 of 25 slices shown (3 of 3)]
[im 1/25]
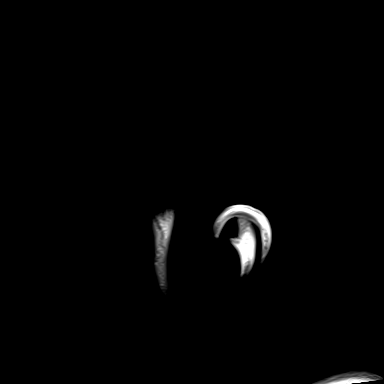

[47 of 48 positions shown; findings below may reference images not displayed]

FINDINGS: Brain: Multiple ring-enhancing lesions present, consistent
metastatic disease.

A 7 mm lesion is present in the anterior inferior right cerebellum
image 80 series 18.

Bilateral cerebellar lesions are best seen on the susceptibility
weighted images, image 13 of series 13 coronal image 12 series 19.

A probable 3 mm lesion is present in the anterior pons on image 53
of series 18.

A 5.5 mm lesion is present in the posterior right temporal
lobe/occipital lobe image 81 series 18.

A 4 mm lesion is present in the anterior right temporal lobe on
image 65 series 18

A 6 mm cortical lesion is present in the posterior left parietal
lobe image 16 of series 18.

A 7.5 mm lesion is present slightly more superiorly in the left
parietal lobe on image 120.

A punctate left parietal lesion is present image 129.

A 3.5 mm lesion is present in the posteromedial right parietal lobe
image 29.

A 3 mm lesion is present in the anterior left frontal lobe on image
30

Focal susceptibility is associated with several of the lesions
compatible with remote hemorrhage. No acute hemorrhage is present.

T2 signal changes are associated with several of the larger lesions
as well.

Moderate generalized atrophy and white matter disease is also
present. The ventricles are proportionate to the degree of atrophy.
No significant extraaxial fluid collection is present.

The internal auditory canals are within normal limits.

Vascular: Insert flow

Skull and upper cervical spine: The craniocervical junction is
normal. Upper cervical spine is within normal limits. Marrow signal
is unremarkable.

Sinuses/Orbits: Mild mucosal thickening is present throughout the
anterior ethmoid air cells and right frontal sinus. No fluid levels
are present. The paranasal sinuses and mastoid air cells are
otherwise clear. Bilateral lens replacements are noted. Globes and
orbits are otherwise unremarkable.
IMPRESSION: 1. And least 11 ring-enhancing lesions consistent with metastatic
disease to the brain.
2. Susceptibility associated with the majority of lesions is
consistent with prior hemorrhage within the metastases.
3. Moderate generalized atrophy and white matter disease likely
reflects the sequela of chronic microvascular ischemia.
4. Mild anterior ethmoid and right frontal sinus disease.

## 2020-03-17 MED ORDER — GADOBUTROL 1 MMOL/ML IV SOLN
10.0000 mL | Freq: Once | INTRAVENOUS | Status: AC | PRN
  Administered 2020-03-17: 10:00:00 10 mL via INTRAVENOUS

## 2020-03-18 ENCOUNTER — Telehealth: Payer: Self-pay | Admitting: Internal Medicine

## 2020-03-18 NOTE — Telephone Encounter (Signed)
On 12/20-spoke to patient and wife regarding the results of the brain MRI showing multiple lesions concerning for metastatic disease to the brain.  Lesions are subcentimeter/and asymptomatic. Recommend urgent evaluation with radiation oncology.  Haley please coordinate-referral with Dr. Donella Stade.  Referred to radiation oncology.  Patient will keep appointment as planned on 12/22.   GB.

## 2020-03-19 ENCOUNTER — Encounter: Payer: Self-pay | Admitting: Internal Medicine

## 2020-03-19 ENCOUNTER — Ambulatory Visit
Admission: RE | Admit: 2020-03-19 | Discharge: 2020-03-19 | Disposition: A | Discharge status: Home / Self Care | Payer: PPO | Source: Ambulatory Visit | Attending: Radiation Oncology | Admitting: Radiation Oncology

## 2020-03-19 ENCOUNTER — Other Ambulatory Visit: Payer: Self-pay | Admitting: *Deleted

## 2020-03-19 VITALS — BP 177/88 | HR 79 | Temp 95.8°F | Resp 16 | Wt 210.8 lb

## 2020-03-19 DIAGNOSIS — I2510 Atherosclerotic heart disease of native coronary artery without angina pectoris: Secondary | ICD-10-CM | POA: Diagnosis not present

## 2020-03-19 DIAGNOSIS — C7970 Secondary malignant neoplasm of unspecified adrenal gland: Secondary | ICD-10-CM | POA: Diagnosis not present

## 2020-03-19 DIAGNOSIS — Z79899 Other long term (current) drug therapy: Secondary | ICD-10-CM | POA: Diagnosis not present

## 2020-03-19 DIAGNOSIS — Z923 Personal history of irradiation: Secondary | ICD-10-CM | POA: Insufficient documentation

## 2020-03-19 DIAGNOSIS — C7989 Secondary malignant neoplasm of other specified sites: Secondary | ICD-10-CM | POA: Insufficient documentation

## 2020-03-19 DIAGNOSIS — Z510 Encounter for antineoplastic radiation therapy: Secondary | ICD-10-CM | POA: Diagnosis not present

## 2020-03-19 DIAGNOSIS — C787 Secondary malignant neoplasm of liver and intrahepatic bile duct: Secondary | ICD-10-CM | POA: Insufficient documentation

## 2020-03-19 DIAGNOSIS — Z87891 Personal history of nicotine dependence: Secondary | ICD-10-CM | POA: Insufficient documentation

## 2020-03-19 DIAGNOSIS — C7931 Secondary malignant neoplasm of brain: Secondary | ICD-10-CM | POA: Insufficient documentation

## 2020-03-19 DIAGNOSIS — C7951 Secondary malignant neoplasm of bone: Secondary | ICD-10-CM | POA: Diagnosis not present

## 2020-03-19 DIAGNOSIS — Z8521 Personal history of malignant neoplasm of larynx: Secondary | ICD-10-CM | POA: Diagnosis not present

## 2020-03-19 DIAGNOSIS — K8020 Calculus of gallbladder without cholecystitis without obstruction: Secondary | ICD-10-CM | POA: Diagnosis not present

## 2020-03-19 DIAGNOSIS — C3411 Malignant neoplasm of upper lobe, right bronchus or lung: Secondary | ICD-10-CM | POA: Diagnosis not present

## 2020-03-19 DIAGNOSIS — Z86010 Personal history of colonic polyps: Secondary | ICD-10-CM | POA: Insufficient documentation

## 2020-03-19 MED ORDER — DEXAMETHASONE 4 MG PO TABS: 4.0000 mg | ORAL_TABLET | Freq: Two times a day (BID) | ORAL | 0 refills | Status: DC

## 2020-03-20 ENCOUNTER — Encounter: Payer: Self-pay | Admitting: Internal Medicine

## 2020-03-20 ENCOUNTER — Telehealth: Payer: Self-pay | Admitting: Pharmacist

## 2020-03-20 ENCOUNTER — Telehealth: Payer: Self-pay | Admitting: Pharmacy Technician

## 2020-03-20 ENCOUNTER — Inpatient Hospital Stay (HOSPITAL_BASED_OUTPATIENT_CLINIC_OR_DEPARTMENT_OTHER): Payer: PPO | Admitting: Internal Medicine

## 2020-03-20 VITALS — BP 138/80 | HR 82 | Temp 98.6°F | Resp 16 | Wt 210.8 lb

## 2020-03-20 DIAGNOSIS — Z7189 Other specified counseling: Secondary | ICD-10-CM | POA: Diagnosis not present

## 2020-03-20 DIAGNOSIS — C7931 Secondary malignant neoplasm of brain: Secondary | ICD-10-CM | POA: Diagnosis not present

## 2020-03-20 DIAGNOSIS — C7951 Secondary malignant neoplasm of bone: Secondary | ICD-10-CM | POA: Diagnosis not present

## 2020-03-20 DIAGNOSIS — C3411 Malignant neoplasm of upper lobe, right bronchus or lung: Secondary | ICD-10-CM | POA: Diagnosis not present

## 2020-03-20 MED ORDER — TRAMETINIB DIMETHYL SULFOXIDE 2 MG PO TABS
2.0000 mg | ORAL_TABLET | Freq: Every day | ORAL | 3 refills | Status: DC
Start: 2020-03-20 — End: 2020-06-13

## 2020-03-20 MED ORDER — DABRAFENIB MESYLATE 75 MG PO CAPS
150.0000 mg | ORAL_CAPSULE | Freq: Two times a day (BID) | ORAL | 3 refills | Status: DC
Start: 2020-03-20 — End: 2020-06-13

## 2020-03-20 MED ORDER — TRAMETINIB DIMETHYL SULFOXIDE 2 MG PO TABS: 2.0000 mg | ORAL_TABLET | Freq: Every day | ORAL | 3 refills | Status: DC

## 2020-03-20 MED ORDER — DABRAFENIB MESYLATE 75 MG PO CAPS: 150.0000 mg | ORAL_CAPSULE | Freq: Two times a day (BID) | ORAL | 3 refills | Status: DC

## 2020-03-20 NOTE — Telephone Encounter (Signed)
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  Prescription dose and frequency assessed. Recommended to check a ECHO at baseline and periodically during treatment.  Current medication list in Epic reviewed, no relevant DDIs with trametinib or dabrafenib identified.  Evaluated chart and no patient barriers to medication adherence identified.   Patient has a high copay, patient advocate Dennison Nancy will help them with assistance process.  Oral Oncology Clinic will continue to follow for assistance approval, initial counseling and start date.  Darl Pikes, PharmD, BCPS, BCOP, CPP Hematology/Oncology Clinical Pharmacist Practitioner [facility]/AP [facility] [phone]  03/20/2020 8:52 AM

## 2020-03-20 NOTE — Telephone Encounter (Signed)
[name] Encounter  Met with patient after his appt today to complete application for [employer].  This will provide Mekinist & Tafinlar to the patient at no out of pocket cost if approved.  I faxed the both Patient and Health Care Provider forms to [phone].  [employer] phone number is [phone]. [employer] will complete a benefits investigation and refer patient to the patient assistance foundation if eligible.  This encounter will be updated until final determination.   [facility] [place] Phone [phone] Fax [phone] 03/20/2020 11:46 AM

## 2020-03-20 NOTE — Progress Notes (Signed)
[place] NOTE  Patient Care Team: [name], MD as PCP - General (Family Medicine) [name], MD as Consulting Physician (Family Medicine) [name], [name], MD (General Surgery) Thornton Park, DO as Consulting Physician (Radiation Oncology)  CHIEF COMPLAINTS/PURPOSE OF CONSULTATION: Lung cancer  #  Oncology History Overview Note  # OCT 2018- STAGE I- A. 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  - PARENCHYMAL MARGIN APPEARS CLEAR; TUMOR WAS 1 CM FROM MARGIN BEFORE REMOVAL OF STAPLE LINE.; NO adjuvant therapy.   # DEC 2021-- METASTATIC CARCINOMA, COMPATIBLE WITH PULMONARY ADENOCARCINOMA [left shoulder Core Biopsy];  NOV 24th PET-right hilar and Hypermetabolic mediastinal lymphadenopathy; small pulmonary nodules; 2 metastatic lesions in the liver/  pancreatic head/left adrenal gland;  muscle and skeletal metastasis.  Subcutaneous nodule-positive for metastatic adeno lung.   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  Short course of Eliquis.smoking-quit 2008.  ---------------------------------------    DIAGNOSIS: LUNG CA  STAGE:  IV      ;GOALS:palliation  CURRENT/MOST RECENT THERAPY: DAB+TREM    Primary cancer of right upper lobe of lung (HCC)     HISTORY OF PRESENTING ILLNESS:  Curtis Andrade. 74 y.o.  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  Unfortunately patient noted to have multiple brain lesions subcentimeter in size [up to 11] without any significant edema.    The interim patient was evaluated by radiation oncology yesterday given his  Patient noted to have mild achy joints starting yesterday.  Improved after taking Motrin.  Otherwise he continues to be fairly active at the [employer].  Continues to work.  Denies any headaches.  Denies any nausea vomiting.  Denies any new lumps or bumps.    Review of Systems  Constitutional: Negative for chills, diaphoresis, fever, malaise/fatigue and weight loss.  HENT: Negative for nosebleeds and sore throat.   Eyes: Negative for double vision.  Respiratory: Negative for cough, hemoptysis, sputum production, shortness of breath and wheezing.   Cardiovascular: Negative for chest pain, palpitations, orthopnea and leg swelling.  Gastrointestinal: Negative for abdominal pain, blood in stool, constipation, diarrhea, heartburn, melena, nausea and vomiting.  Genitourinary: Negative for dysuria, frequency and urgency.  Musculoskeletal: Positive for back pain and joint pain.  Skin: Negative.  Negative for itching and rash.  Neurological: Negative for dizziness, tingling, focal weakness, weakness and headaches.  Endo/Heme/Allergies: Does not bruise/bleed easily.  Psychiatric/Behavioral: Negative for depression. The patient is not nervous/anxious and does not have insomnia.      MEDICAL HISTORY:  Past Medical History:  Diagnosis Date  . 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 Colon polyp 2013  . Hemorrhoids    pt denies  . 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 Baruch Gouty and Dr. Nadeen Landau  . Phlebitis 1998  . Primary cancer of right upper lobe of lung ([place]) 01/14/2017   Dr. Genevive Bi performed a wedge resection of RUL.   . 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/27/2016    SURGICAL HISTORY: Past Surgical History:  Procedure Laterality Date  .  COLONOSCOPY  2013   Dr Vira Agar  . COLONOSCOPY WITH PROPOFOL N/A 12/23/2016    Procedure: COLONOSCOPY WITH PROPOFOL;  Surgeon: Robert Bellow, MD;  Location: [facility];  Service: Endoscopy;  Laterality: N/A;  . EYE SURGERY    . HERNIA REPAIR Right 1990's  . TENDON REPAIR Left 03/14/2019   Procedure: TENDON REPAIR AND GRAFT  TRANSFER EXTENSOR INDICIS PROPRIUS LEFT THUMB;  Surgeon: [name], MD;  Location: [facility] [place];  Service: Orthopedics;  Laterality: Left;  AXILLARY BLOCK  . THORACOTOMY Right 01/14/2017   Procedure: RIGHT THORACOSCOPYWITH WIDE WEDGE RESECTION,  PREOP BROCHOSCOPY;  Surgeon: [name], MD;  Location: [facility] ORS;  Service: General;  Laterality: Right;  . THROAT SURGERY  2010    SOCIAL HISTORY: Social History   Socioeconomic History  . Marital status: Married    Spouse name: Not on file  . Number of children: Not on file  . Years of education: Not on file  . Highest education level: Not on file  Occupational History  . Not on file  Tobacco Use  . Smoking status: Former Smoker    Years: 50.00    Quit date: 03/31/2007    Years since quitting: 12.9  . Smokeless tobacco: Never Used  Vaping Use  . Vaping Use: Never used  Substance and Sexual Activity  . Alcohol use: No  . Drug use: No  . Sexual activity: Not on file  Other Topics Concern  . Not on file  Social History Narrative  . Not on file   Social Determinants of Health   Financial Resource Strain: Not on file  Food Insecurity: Not on file  Transportation Needs: Not on file  Physical Activity: Not on file  Stress: Not on file  Social Connections: Not on file  Intimate Partner Violence: Not on file    FAMILY HISTORY: Family History  Problem Relation Age of Onset  . Alzheimer's disease Mother   . Alzheimer's disease Father   . Stroke Father   . Colon cancer Neg Hx     ALLERGIES:  has No Known Allergies.  MEDICATIONS:  Current Outpatient Medications  Medication Sig Dispense Refill  . Ascorbic Acid (VITAMIN C) 1000 MG tablet Take 1,000 mg by  mouth daily.    Marland Kitchen aspirin EC 81 MG tablet Take 81 mg by mouth daily.    Marland Kitchen dexamethasone (DECADRON) 4 MG tablet Take 1 tablet (4 mg total) by mouth 2 (two) times daily with a meal. 30 tablet 0  . folic acid (FOLVITE) 086 MCG tablet Take 800 mcg by mouth daily.    . Glucosamine-Chondroitin (COSAMIN DS PO) Take 1 tablet by mouth daily.    . Multiple Vitamin (MULTIVITAMIN WITH MINERALS) TABS tablet Take 1 tablet by mouth daily. Senior Multivitamin.    . Omega-3 1000 MG CAPS Take 1 tablet by mouth 1 day or 1 dose.    Marland Kitchen omeprazole (PRILOSEC) 40 MG capsule Take 40 mg by mouth daily before supper.     . vitamin E 400 UNIT capsule Take 1 capsule by mouth 1 day or 1 dose.    . dabrafenib mesylate (TAFINLAR) 75 MG capsule Take 2 capsules (150 mg total) by mouth 2 (two) times daily. Take on an empty stomach 1 hour before or 2 hours after meals. 120 capsule 3  . trametinib dimethyl sulfoxide (MEKINIST) 2 MG tablet Take 1 tablet (2 mg total) by mouth daily. Take 1 hour before or 2 hours after a meal. Store refrigerated in  original container. 30 tablet 3   No current facility-administered medications for this visit.      Marland Kitchen  PHYSICAL EXAMINATION: ECOG PERFORMANCE STATUS: 0 - Asymptomatic  Vitals:   03/20/20 1010  BP: 138/80  Pulse: 82  Resp: 16  Temp: 98.6 F (37 C)  SpO2: 98%   Filed Weights   03/20/20 1010  Weight: 210 lb 12.8 oz (95.6 kg)    Physical Exam Constitutional:      Comments: Ambulating by independently.  Accompanied by his wife.  His 2 daughters over the phone/FaceTime.  HENT:     Head: Normocephalic and atraumatic.     Mouth/Throat:     Pharynx: No oropharyngeal exudate.  Eyes:     Pupils: Pupils are equal, round, and reactive to light.  Cardiovascular:     Rate and Rhythm: Normal rate and regular rhythm.  Pulmonary:     Effort: No respiratory distress.     Breath sounds: No wheezing.  Abdominal:     General: Bowel sounds are normal. There is no distension.      Palpations: Abdomen is soft. There is no mass.     Tenderness: There is no abdominal tenderness. There is no guarding or rebound.  Musculoskeletal:        General: No tenderness. Normal range of motion.     Cervical back: Normal range of motion and neck supple.  Skin:    General: Skin is warm.  Neurological:     Mental Status: He is alert and oriented to person, place, and time.  Psychiatric:        Mood and Affect: Affect normal.   ;  LABORATORY DATA:  I have reviewed the data as listed Lab Results  Component Value Date   WBC 7.2 02/27/2020   HGB 14.2 02/27/2020   HCT 42.7 02/27/2020   MCV 95.7 02/27/2020   PLT 221 02/27/2020   Recent Labs    02/09/20 1011 02/12/20 0941  NA  --  135  K  --  4.6  CL  --  101  CO2  --  27  GLUCOSE  --  104*  BUN  --  15  CREATININE 1.10 0.95  CALCIUM  --  8.9  GFRNONAA  --  >60  PROT  --  7.7  ALBUMIN  --  4.0  AST  --  28  ALT  --  24  ALKPHOS  --  51  BILITOT  --  0.7    RADIOGRAPHIC STUDIES: I have personally reviewed the radiological images as listed and agreed with the findings in the report. MR Brain W Wo Contrast  Result Date: 03/18/2020 CLINICAL DATA:  Cell cancer.  Pretreatment staging. EXAM: MRI HEAD WITHOUT AND WITH CONTRAST TECHNIQUE: Multiplanar, multiecho pulse sequences of the brain and surrounding structures were obtained without and with intravenous contrast. CONTRAST:  87m GADAVIST GADOBUTROL 1 MMOL/ML IV SOLN COMPARISON:  None. FINDINGS: Brain: Multiple ring-enhancing lesions present, consistent metastatic disease. A 7 mm lesion is present in the anterior inferior right cerebellum image 80 series 18. Bilateral cerebellar lesions are best seen on the susceptibility weighted images, image 13 of series 13 coronal image 12 series 19. A probable 3 mm lesion is present in the anterior pons on image 53 of series 18. A 5.5 mm lesion is present in the posterior right temporal lobe/occipital lobe image 81 series 18. A 4 mm  lesion is present in the anterior right temporal lobe on image 65 series 18 A 6  mm cortical lesion is present in the posterior left parietal lobe image 16 of series 18. A 7.5 mm lesion is present slightly more superiorly in the left parietal lobe on image 120. A punctate left parietal lesion is present image 129. A 3.5 mm lesion is present in the posteromedial right parietal lobe image 29. A 3 mm lesion is present in the anterior left frontal lobe on image 30 Focal susceptibility is associated with several of the lesions compatible with remote hemorrhage. No acute hemorrhage is present. T2 signal changes are associated with several of the larger lesions as well. Moderate generalized atrophy and white matter disease is also present. The ventricles are proportionate to the degree of atrophy. No significant extraaxial fluid collection is present. The internal auditory canals are within normal limits. Vascular: Insert flow Skull and upper cervical spine: The craniocervical junction is normal. Upper cervical spine is within normal limits. Marrow signal is unremarkable. Sinuses/Orbits: Mild mucosal thickening is present throughout the anterior ethmoid air cells and right frontal sinus. No fluid levels are present. The paranasal sinuses and mastoid air cells are otherwise clear. Bilateral lens replacements are noted. Globes and orbits are otherwise unremarkable. IMPRESSION: 1. And least 11 ring-enhancing lesions consistent with metastatic disease to the brain. 2. Susceptibility associated with the majority of lesions is consistent with prior hemorrhage within the metastases. 3. Moderate generalized atrophy and white matter disease likely reflects the sequela of chronic microvascular ischemia. 4. Mild anterior ethmoid and right frontal sinus disease. Electronically Signed   By: San Morelle M.D.   On: 03/18/2020 08:38   NM PET Image Restag (PS) Skull Base To Thigh  Result Date: 02/20/2020 CLINICAL DATA:   Subsequent treatment strategy for non-small cell lung cancer. History of right upper lobe wedge resection. Interval development of bilateral pulmonary nodules and adenopathy. EXAM: NUCLEAR MEDICINE PET SKULL BASE TO THIGH TECHNIQUE: 11.359 mCi F-18 FDG was injected intravenously. Full-ring PET imaging was performed from the skull base to thigh after the radiotracer. CT data was obtained and used for attenuation correction and anatomic localization. Fasting blood glucose: 90 mg/dl COMPARISON:  Multiple prior chest CTs and a prior PET-CT from 11/24/2016. The most recent chest CT is 02/09/2020 FINDINGS: Mediastinal blood pool activity: SUV max 1.79 Liver activity: SUV max NA NECK: No hypermetabolic lymph nodes in the neck. Incidental CT findings: none CHEST: There are enlarged and hypermetabolic mediastinal lymph nodes. 9 mm upper right paratracheal lymph node on image 42/6 is hypermetabolic with SUV max of 8.34. Right-sided precarinal lymph node measures 11 mm on image 100/3 and the SUV max is 6.07. 10 mm subcarinal node on image 111/3 has an SUV max of 7.03. As demonstrated on the recent CT scan there also multiple small bilateral pulmonary nodules. These are below the limits of sensitivity for PET CT but are very likely metastatic disease. No supraclavicular or axillary adenopathy. There are multiple muscle lesions consistent with muscle metastasis. There is a lesion in the teres minor muscle which has an SUV max of 7.18. Other less hypermetabolic left shoulder girdle lesions are noted. There is also a lesion in the right shoulder in the anterior deltoid muscle which has an SUV max of 5.73. Incidental CT findings: Stable aortic and coronary artery calcifications. ABDOMEN/PELVIS: Ill-defined 15 mm low-attenuation lesion in segment 2-3 of the liver is hypermetabolic with SUV max of 1.96. There is also a 8 mm lesion in segment 6 far inferiorly which has an SUV max of 4.72. Small left adrenal gland  nodule is mildly  hypermetabolic with SUV max of 7.74. Small focus of hypermetabolism is noted in the pancreatic head/uncinate process region. Difficult to see lesion on the CT scan. It could represent a pancreatic metastasis or possibly an adjacent lymph node. SUV max is 4.22. Muscle lesions are noted in the left psoas muscle and also the left paralumbar muscles. Incidental CT findings: Advanced vascular calcifications. 3 cm infrarenal abdominal aortic aneurysm appears stable. Severe sigmoid colon diverticulosis. SKELETON: Diffuse osseous metastatic disease is noted with numerous hypermetabolic skeletal lesions. These include the left humeral head, left scapula, right sternum, thoracic and lumbar vertebral bodies, right iliac bone and left iliac bone. Left humeral head lesion has an SUV max of 3.66. L1 lesion has an SUV max of 4.87. Right upper iliac bone lesion has an SUV max of 6.36. Incidental CT findings: none IMPRESSION: 1. Hypermetabolic mediastinal lymphadenopathy consistent with metastatic disease. There are also several small pulmonary nodules consistent with metastatic pulmonary disease. 2. Two metastatic lesions noted in the liver and a possible metastatic focus in the pancreatic head and in the left adrenal gland. 3. Widespread muscle and skeletal metastasis. Electronically Signed   By: Marijo Sanes M.D.   On: 02/20/2020 16:54   [place] CORE BIOPSY (SOFT TISSUE)  Result Date: 02/27/2020 INDICATION: 74 year old with history of lung and laryngeal cancer. Patient has evidence of metastatic disease based on recent PET-CT and patient needs a tissue diagnosis. Patient has a hypermetabolic muscular lesion posterior to the left scapula. EXAM: ULTRASOUND-GUIDED CORE BIOPSY OF A SOFT TISSUE LESION IN THE LEFT POSTERIOR CHEST MEDICATIONS: None. ANESTHESIA/SEDATION: Moderate (conscious) sedation was employed during this procedure. A total of Versed 1.0 mg and Fentanyl 50 mcg was administered intravenously. Moderate Sedation Time:  11 minutes. The patient's level of consciousness and vital signs were monitored continuously by radiology nursing throughout the procedure under my direct supervision. FLUOROSCOPY TIME:  Fluoroscopy Time: None COMPLICATIONS: None immediate. PROCEDURE: Informed written consent was obtained from the patient after a thorough discussion of the procedural risks, benefits and alternatives. All questions were addressed. A timeout was performed prior to the initiation of the procedure. Area of concern is the left posterior chest. Hypoechoic intramuscular lesion was identified. This finding corresponds with the recent PET-CT findings. The overlying skin was prepped with chlorhexidine and sterile field was created. Skin and soft tissues were anesthetized using 1% lidocaine. Small incision was made. Using ultrasound guidance, 17 gauge coaxial needle was directed into the lesion. Total of 5 core biopsies were obtained with an 18 gauge core device. Specimens placed in formalin. 17 gauge needle was removed without complication. Bandage placed over the puncture site. FINDINGS: Hypoechoic lesion posterior to left scapula. Lesion measures roughly 4.5 x 1.4 x 2.2 cm. Five adequate core specimens were obtained. No immediate bleeding or hematoma formation. IMPRESSION: Successful ultrasound guided core biopsies to a soft tissue lesion in the left posterior chest musculature. Electronically Signed   By: Markus Daft M.D.   On: 02/27/2020 12:03    ASSESSMENT & PLAN:   Primary cancer of right upper lobe of lung ([place]) # Stage IV/recurrent adenocarcinoma of the lung- NOV 24th PET-right hilar and Hypermetabolic mediastinal lymphadenopathy; small pulmonary nodules; 2 metastatic lesions in the liver/  pancreatic head/left adrenal gland;  muscle and skeletal metastasis.  Subcutaneous nodule-positive for metastatic adeno lung. Brain MRI multiple subcentimeter enhancing brain mets no significant edema  # NGS/F-One: B-RAF V600E; PDL-1-30%.     #Patient is clinically asymptomatic from his metastatic lung cancer,  including the brain.  Long discussion regarding the use of first-line therapy with dabrafenib plus Mekinist.  Discussed the targeted nature of the treatment; understand treatments are palliative not curative.  #Reviewed the potential side effects including but not limited to fevers skin rash diarrhea nausea vomiting.  Discussed with risk of skin cancers with single agent dabrafenib.  However with Mekinist the risk of skin cancer is low.  Discussed the visual/cardiac side effects of MEK inhibitor.  Recommend MUGA scan.  #Had a long discussion with patient and wife regarding sequence of treatments especially context of radiation.  Given the brain penetration of the TKI; response rates are of 60 to 70%; and also patient's lack of symptomatic brain disease-I think is reasonable to proceed with TKI upfront.  However at any point of time if patient is noted to have symptoms-he will be referred back to radiation for radiation oncology.  Discussed with pharmacy; Education officer, museum.  # Bone lesions-mildly symptomatic s/p Zometa.  S/p first infusion body aches fever; currently resolved.  Recommend Zometa infusion over 30 minutes.  And also recommend prophylactic Tylenol.  #Discussed with Dr. Sherlene Shams radiation oncology.  If there is any delay in availability medication-recommend proceeding with radiation.  As per the plan patient is set to start radiation on 12/27. Will await dabrafenib plus MEK inhibitor availability.   # DISPOSITION:  # MUGA scan  # follow up- TBD- Dr.B  # I reviewed the blood work- with the patient in detail; also reviewed the imaging independently [as summarized above]; and with the patient in detail.   # 40 minutes face-to-face with the patient discussing the above plan of care; more than 50% of time spent on prognosis/ natural history; counseling and coordination.  All questions were answered. The patient knows to call  the clinic with any problems, questions or concerns.    Cammie Sickle, MD 03/20/2020 10:48 PM

## 2020-03-20 NOTE — Progress Notes (Addendum)
[facility] NEW PATIENT EVALUATION  Name: Curtis Andrade. MRN: [id number]  Date:   03/19/2020           DOB: Feb 13, 1946  Status: OUTPT-   CC: [name], MD  [name], MD    REFERRING PHYSICIAN: [name], MD   DIAGNOSIS: NEW ONSET TNTC BRAIN METS('@LEAST'  11)-ASYMPTOMATIC, SOFT TISSUE MASSES BX ADENOCARCINOMA  LEFT POST SHOULDER 02/27/20, PET/CT 02/20/20-MULTIPLE MUSCLE METS/DIFFUSE BONE METS/LIVER MET/ADRENAL MET/PANCREATIC HEAD MET, TX WITH ZOMETA  MILD SYMPTOMS, MRI BRAIN 03/18/2020 > 11 LESIONS  WITH EDEMA (ASYMPTOMATIC), PRIOR T1N0M0 ADENO RUL S/P WEDGE RESEC 01/14/2017 AND ADJUVANT NO THERAPY WITH - FOLLOW-UP TILL 11/21, ACTIVE ADL WORKING OUT AT [employer], TAKING CARE OF PARENTS WITH ALZHEIMER'S DS, KPS 90, PRIOR LARYNX DX TREATED WITH FULL COURSE XRT 2010 (NOW DS FREE) , STARTED ON 4MG DEXAMETHSONE BID, IMMUNO MARKERS + BRAF V600 E (DABRAFENIB + TRAMETINIB)  STAGE: SCC LARYNX T1N0M0               ADENOCARCINOMA RUL T1N0M0  NOW IV-T4N2M1  ICD-10: C34.11, C79.31, C79.51,C78.7, C74.0,   HISTORY OF PRESENT ILLNESS:  Curtis B Kasyn Rolph. is a 74 y.o. male who is  A pleasant active asymptomatic Caucasian male with a recent diagnosis of metastatic adenocarcinoma of the lung.  Patient was originally diagnosed in 2018 with adenocarcinoma the right upper lobe measuring 1.2 x 0.9 x 1 cm treated with wedge resection.  All margins were negative and he was properly staged T1 N0 M0 and received no adjuvant care but continual follow-up.  Patient was a prior smoker 50 pack years quitting 011 2009.  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  Patient had PET/CT on 02/20/2020 noting multiple muscle mets, diffuse osseous mets to include left  humeral head, left scapula, right sternum, thoracic and lumbar vertebral bodies, right iliac and left iliac, liver mets, adrenal mets, and pancreas had met all essentially asymptomatic.  BX soft tissues the left posterior shoulder area returned metastatic adenocarcinoma consistent with lung primary  Bony lesions have been treated with ZOMETA.  MRI OF brain on 03/18/2020 shows 11 documented lesions with some mild edema.  Patient started on dexamethasone 4 mg twice daily on 03/19/2020 along with GI prophylaxis that he is already taking.  Patient to see MEDICAL ONCOLOGY Dr. 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     PAST SURGICAL HISTORY:  Past Surgical History:  Procedure Laterality Date   COLONOSCOPY  2013   Dr Vira Agar   COLONOSCOPY WITH PROPOFOL N/A 12/23/2016   Procedure: COLONOSCOPY WITH PROPOFOL;  Surgeon: Robert Bellow, MD;  Location: [facility];  Service: Endoscopy;  Laterality: N/A;   EYE SURGERY     HERNIA REPAIR Right 1990's   TENDON REPAIR Left 03/14/2019   Procedure: TENDON REPAIR AND GRAFT  TRANSFER EXTENSOR INDICIS PROPRIUS LEFT THUMB;  Surgeon: [name], MD;  Location: [facility];  Service: Orthopedics;  Laterality: Left;  AXILLARY  BLOCK   THORACOTOMY Right 01/14/2017   Procedure: RIGHT THORACOSCOPYWITH WIDE WEDGE RESECTION,  PREOP BROCHOSCOPY;  Surgeon: [name], MD;  Location: [facility] ORS;  Service: General;  Laterality: Right;   THROAT SURGERY  2010     FAMILY HISTORY: family history includes Alzheimer's disease in his father and mother; Stroke in his  father.   SOCIAL HISTORY:  reports that he quit smoking about 12 years ago. He quit after 50.00 years of use. He has never used smokeless tobacco. He reports that he does not drink alcohol and does not use drugs.   ALLERGIES: Patient has no known allergies.   MEDICATIONS:  Current Outpatient Medications  Medication Sig Dispense Refill   Ascorbic Acid (VITAMIN C) 1000 MG tablet Take 1,000 mg by mouth daily.     aspirin EC 81 MG tablet Take 81 mg by mouth daily.     folic acid (FOLVITE) 161 MCG tablet Take 800 mcg by mouth daily.     Glucosamine-Chondroitin (COSAMIN DS PO) Take 1 tablet by mouth daily.     Multiple Vitamin (MULTIVITAMIN WITH MINERALS) TABS tablet Take 1 tablet by mouth daily. Senior Multivitamin.     Omega-3 1000 MG CAPS Take 1 tablet by mouth 1 day or 1 dose.     omeprazole (PRILOSEC) 40 MG capsule Take 40 mg by mouth daily before supper.      vitamin E 400 UNIT capsule Take 1 capsule by mouth 1 day or 1 dose.     dexamethasone (DECADRON) 4 MG tablet Take 1 tablet (4 mg total) by mouth 2 (two) times daily with a meal. (Patient not taking: Reported on 03/19/2020) 30 tablet 0   No current facility-administered medications for this encounter.     REVIEW OF SYSTEMS:  As noted above    PHYSICAL EXAM:  weight is 210 lb 12.8 oz (95.6 kg). His tympanic temperature is 95.8 F (35.4 C) (abnormal). His blood pressure is 177/88 (abnormal) and his pulse is 79. His respiration is 16.   Patient is alert and oriented and answers questions appropriately.  Head: Normocephalic with no apparent masses.  Eyes pupils are reactive to light and accommodation extraocular muscles intact  Neck supple adequate dry range of motion without lymphadenopathy.  Chest no inspiratory retraction and lungs are clear to auscultation.  Abdomen soft nontender  Extremities range of motion adequate for age.  Well toned.  Vertebral exam: No bony tenderness.   LABORATORY DATA:  Lab  Results  Component Value Date   WBC 7.2 02/27/2020   HGB 14.2 02/27/2020   HCT 42.7 02/27/2020   MCV 95.7 02/27/2020   PLT 221 02/27/2020   Lab Results  Component Value Date   NA 135 02/12/2020   K 4.6 02/12/2020   CL 101 02/12/2020   CO2 27 02/12/2020   Lab Results  Component Value Date   ALT 24 02/12/2020   AST 28 02/12/2020   ALKPHOS 51 02/12/2020   BILITOT 0.7 02/12/2020   PATH: Specimen Submitted: 02/27/2020 A. Left shoulder   Clinical History: History of laryngeal and lung cancer. Evidence for  metastatic disease and needs tissue diagnosis     DIAGNOSIS:  A. SOFT TISSUE, LEFT SHOULDER; ULTRASOUND-GUIDED CORE NEEDLE BIOPSY:  - POSITIVE FOR MALIGNANCY.  - METASTATIC CARCINOMA, COMPATIBLE WITH PULMONARY ADENOCARCINOMA.    RADIOLOGY:EXAM: CT CHEST WITH CONTRAST 111/02/2020  TECHNIQUE: Multidetector CT imaging of the chest was performed during intravenous contrast administration.  CONTRAST:  34m OMNIPAQUE IOHEXOL 300 MG/ML  SOLN  COMPARISON:  02/08/2019  FINDINGS: Cardiovascular: Heart size appears normal. No pericardial effusion. Aortic atherosclerosis. Coronary artery calcifications.  Mediastinum/Nodes: Normal appearance of the thyroid gland. The trachea appears patent and is midline. Normal appearance of the esophagus.  Interval development of mediastinal and right hilar adenopathy: New right paratracheal lymph node measures 2 cm, image 55/2. High right paratracheal lymph node measures 1 cm, image 35/2. Previously 0.5 cm. Subcarinal lymph node measures 1.3 cm, image 71/2. Previously 1.2 cm. 1.6 cm right hilar lymph node is noted, image 67/2. Previously 1.1 cm. Right inferior hilar lymph node measures 1.1 cm, image 87/2. Previously 0.4 cm.  Lungs/Pleura: Centrilobular and paraseptal emphysema.  Multiple scattered small pulmonary nodules are noted in both lungs:  Along the suture line within the paramediastinal right upper lobe there is a  new nodule measuring 6 mm, image 50/3. Adjacent subpleural nodule measures 7 mm, image 56/3. Also new from previous exam.  Within the anteromedial left upper lobe subpleural nodule measures 8 mm, image 72/3. New from previous exam.  Also within the anteromedial left upper lobe is a new 6 mm subpleural nodule, image 76/3.  Posterior basal right upper lobe nodule measures 5 mm, image 74/3. New from previous exam.  Right middle lobe lung nodule measures 4 mm, image 73/3. New from previous exam.  Tiny right lung nodule near the base measures 3 mm and appears new from the previous exam, image 114/3.  Posterior left base nodule measures 3 mm, image 137/3. New from previous study.  Upper Abdomen: Unchanged small cyst within anterior right lobe of liver measuring 1.1 cm. Gallstones are again noted within the gallbladder. Stable low-attenuation enlargement of the left adrenal gland which is favored to represent adenomatous hyperplasia. Scarring is noted involving the upper pole of right kidney.  Musculoskeletal: No chest wall abnormality. No acute or significant osseous findings.  IMPRESSION: 1. Interval development of bilateral pulmonary nodules worrisome for metastatic disease. 2. Interval increase in size of right hilar and mediastinal lymph nodes worrisome for metastatic adenopathy. 3. Coronary artery calcifications noted. 4. Gallstones.  PET/CT 02/20/2020 TECHNIQUE: 11.359 mCi F-18 FDG was injected intravenously. Full-ring PET imaging was performed from the skull base to thigh after the radiotracer. CT data was obtained and used for attenuation correction and anatomic localization.  Fasting blood glucose: 90 mg/dl  COMPARISON:  Multiple prior chest CTs and a prior PET-CT from 11/24/2016. The most recent chest CT is 02/09/2020  FINDINGS: Mediastinal blood pool activity: SUV max 1.79  Liver activity: SUV max NA  NECK: No hypermetabolic lymph nodes in the  neck.  Incidental CT findings: none  CHEST: There are enlarged and hypermetabolic mediastinal lymph nodes.  9 mm upper right paratracheal lymph node on image 70/1 is hypermetabolic with SUV max of 7.79.  Right-sided precarinal lymph node measures 11 mm on image 100/3 and the SUV max is 6.07.  10 mm subcarinal node on image 111/3 has an SUV max of 7.03. As demonstrated on the recent CT scan there also multiple small bilateral pulmonary nodules. These are below the limits of sensitivity for PET CT but are very likely metastatic disease.  No supraclavicular or axillary adenopathy.  There are multiple muscle lesions consistent with muscle metastasis. There is a lesion in the teres minor muscle which has an SUV max of 7.18. Other less hypermetabolic left shoulder girdle lesions are noted. There is also a lesion in the right shoulder in the anterior deltoid muscle which has an SUV max of 5.73.  Incidental CT findings:  Stable aortic and coronary artery calcifications.  ABDOMEN/PELVIS: Ill-defined 15 mm low-attenuation lesion in segment 2-3 of the liver is hypermetabolic with SUV max of 6.83. There is also a 8 mm lesion in segment 6 far inferiorly which has an SUV max of 4.72.  Small left adrenal gland nodule is mildly hypermetabolic with SUV max of 4.19.  Small focus of hypermetabolism is noted in the pancreatic head/uncinate process region. Difficult to see lesion on the CT scan. It could represent a pancreatic metastasis or possibly an adjacent lymph node. SUV max is 4.22.  Muscle lesions are noted in the left psoas muscle and also the left paralumbar muscles.  Incidental CT findings: Advanced vascular calcifications. 3 cm infrarenal abdominal aortic aneurysm appears stable. Severe sigmoid colon diverticulosis.  SKELETON: Diffuse osseous metastatic disease is noted with numerous hypermetabolic skeletal lesions. These include the left humeral head, left  scapula, right sternum, thoracic and lumbar vertebral bodies, right iliac bone and left iliac bone.  Left humeral head lesion has an SUV max of 3.66.  L1 lesion has an SUV max of 4.87.  Right upper iliac bone lesion has an SUV max of 6.36.  Incidental CT findings: none  IMPRESSION: 1. Hypermetabolic mediastinal lymphadenopathy consistent with metastatic disease. There are also several small pulmonary nodules consistent with metastatic pulmonary disease. 2. Two metastatic lesions noted in the liver and a possible metastatic focus in the pancreatic head and in the left adrenal gland. 3. Widespread muscle and skeletal metastasis.  BRAIN MRI 03/18/2020:EXAM: MRI HEAD WITHOUT AND WITH CONTRAST  TECHNIQUE: Multiplanar, multiecho pulse sequences of the brain and surrounding structures were obtained without and with intravenous contrast.  CONTRAST:  54m GADAVIST GADOBUTROL 1 MMOL/ML IV SOLN  COMPARISON:  None.  FINDINGS: Brain: Multiple ring-enhancing lesions present, consistent metastatic disease.  A 7 mm lesion is present in the anterior inferior right cerebellum image 80 series 18.  Bilateral cerebellar lesions are best seen on the susceptibility weighted images, image 13 of series 13 coronal image 12 series 19.  A probable 3 mm lesion is present in the anterior pons on image 53 of series 18.  A 5.5 mm lesion is present in the posterior right temporal lobe/occipital lobe image 81 series 18.  A 4 mm lesion is present in the anterior right temporal lobe on image 65 series 18  A 6 mm cortical lesion is present in the posterior left parietal lobe image 16 of series 18.  A 7.5 mm lesion is present slightly more superiorly in the left parietal lobe on image 120.  A punctate left parietal lesion is present image 129.  A 3.5 mm lesion is present in the posteromedial right parietal lobe image 29.  A 3 mm lesion is present in the anterior left frontal  lobe on image 30  Focal susceptibility is associated with several of the lesions compatible with remote hemorrhage. No acute hemorrhage is present.  T2 signal changes are associated with several of the larger lesions as well.  Moderate generalized atrophy and white matter disease is also present. The ventricles are proportionate to the degree of atrophy. No significant extraaxial fluid collection is present.  The internal auditory canals are within normal limits.  Vascular: Insert flow  Skull and upper cervical spine: The craniocervical junction is normal. Upper cervical spine is within normal limits. Marrow signal is unremarkable.  Sinuses/Orbits: Mild mucosal thickening is present throughout the anterior ethmoid air cells and right frontal sinus. No fluid levels are present. The paranasal sinuses  and mastoid air cells are otherwise clear. Bilateral lens replacements are noted. Globes and orbits are otherwise unremarkable.  IMPRESSION: 1. And least 11 ring-enhancing lesions consistent with metastatic disease to the brain. 2. Susceptibility associated with the majority of lesions is consistent with prior hemorrhage within the metastases. 3. Moderate generalized atrophy and white matter disease likely reflects the sequela of chronic microvascular ischemia. 4. Mild anterior ethmoid and right frontal sinus disease.    IMPRESSION:  NEW ONSET TNTC BRAIN METS('@LEAST'  11)-ASYMPTOMATIC   SOFT TISSUE MASSES BX ADENOCARCINOMA  LEFT POST SHOULDER 02/27/20  PET/CT 02/20/20-MULTIPLE MUSCLE METS/DIFFUSE BONE METS/LIVER MET/ADRENAL MET/PANCREATIC HEAD MET, TX WITH ZOMETA  MILD SYMPTOMS   MRI BRAIN 03/18/2020 > 11 LESIONS  WITH EDEMA (ASYMPTOMATIC)   PRIOR T1N0M0 ADENO RUL S/P WEDGE RESEC 01/14/2017 AND NO ADJUVANT THERAPY WITH - FOLLOW-UP TILL 11/21   ACTIVE ADL WORKING OUT AT [employer]   KPS 90   PRIOR LARYNX DX TREATED WITH FULL COURSE XRT 2010 (NOW DS FREE)   STARTED ON 4MG  DEXAMETHSONE BID   IMMUNO MARKERS + BRAF V600 E (DABRAFENIB + TRAMETINIB)        PLAN: I discussed options with the patient wife and 2 daughters that were on Zoom.  All questions were answered to their satisfaction  Discussed whole brain XRT and side effects discussed.  Patient signed informed consent under his own free will after all questions answered  Patient underwent CT simulation this date  Patient is to see medical oncology on 03/20/2020  IMMUNO MARKERS + BRAF V600 E (DABRAFENIB + TRAMETINIB)  IMMUNOTHERAPYfor lung carcinoma does penetrate BBB and treatment with immunotherapy first would be reasonable technique.  Should patient start to see symptoms while on immunotherapy we would recommend to start XRT and plan dose was 3000 cGy in 10 fractions.  We did discuss our findings with Dr. Rogue Bussing on 03/20/2020  Awaiting patient's decision as to WBRT or early start immunotherapy.  I spent 55 minutes face to face with the patient and more than 50% of that time was spent in counseling and/or coordination of care.   RANDY Keilani Terrance, DO/FACRO/MBA

## 2020-03-20 NOTE — Assessment & Plan Note (Addendum)
#  Stage IV/recurrent adenocarcinoma of the lung- NOV 24th PET-right hilar and Hypermetabolic mediastinal lymphadenopathy; small pulmonary nodules; 2 metastatic lesions in the liver/  pancreatic head/left adrenal gland;  muscle and skeletal metastasis.  Subcutaneous nodule-positive for metastatic adeno lung. Brain MRI multiple subcentimeter enhancing brain mets no significant edema  # NGS/F-One: B-RAF V600E; PDL-1-30%.    #Patient is clinically asymptomatic from his metastatic lung cancer, including the brain.  Long discussion regarding the use of first-line therapy with dabrafenib plus Mekinist.  Discussed the targeted nature of the treatment; understand treatments are palliative not curative.  #Reviewed the potential side effects including but not limited to fevers skin rash diarrhea nausea vomiting.  Discussed with risk of skin cancers with single agent dabrafenib.  However with Mekinist the risk of skin cancer is low.  Discussed the visual/cardiac side effects of MEK inhibitor.  Recommend MUGA scan.  #Had a long discussion with patient and wife regarding sequence of treatments especially context of radiation.  Given the brain penetration of the TKI; response rates are of 60 to 70%; and also patient's lack of symptomatic brain disease-I think is reasonable to proceed with TKI upfront.  However at any point of time if patient is noted to have symptoms-he will be referred back to radiation for radiation oncology.  Discussed with pharmacy; Education officer, museum.  # Bone lesions-mildly symptomatic s/p Zometa.  S/p first infusion body aches fever; currently resolved.  Recommend Zometa infusion over 30 minutes.  And also recommend prophylactic Tylenol.  #Discussed with Dr. Sherlene Shams radiation oncology.  If there is any delay in availability medication-recommend proceeding with radiation.  As per the plan patient is set to start radiation on 12/27. Will await dabrafenib plus MEK inhibitor availability.   #  DISPOSITION:  # MUGA scan  # follow up- TBD- Dr.B  # I reviewed the blood work- with the patient in detail; also reviewed the imaging independently [as summarized above]; and with the patient in detail.   # 40 minutes face-to-face with the patient discussing the above plan of care; more than 50% of time spent on prognosis/ natural history; counseling and coordination.

## 2020-03-20 NOTE — Telephone Encounter (Signed)
[name] Encounter  1 - Received notification from [employer] that prior authorization for Mekinist is required.  PA submitted on CoverMyMeds Key [id number] Status is pending   2 - Received notification from [employer] that prior authorization for Curtis Andrade is required.  PA submitted on CoverMyMeds Key [id number] Status is pending  Oral Oncology Clinic will continue to follow.  [name] [place] Phone [phone] Fax [phone] 03/20/2020 10:19 AM

## 2020-03-20 NOTE — Telephone Encounter (Signed)
[name] Encounter  1 - Prior Authorization for Mekinist has been approved.    PA# [id number] Effective dates: 03/20/20 through 03/20/21  Patients co-pay is $2827.91  2 - Prior Authorization for Curtis Andrade has been approved.    PA# [id number] Effective dates: 03/20/20 through 03/20/21  Patients co-pay is $566.85  Oral Oncology Clinic will continue to follow.   [name] Phone [phone] Fax [phone] 03/20/2020 10:21 AM

## 2020-03-20 NOTE — Addendum Note (Signed)
Encounter addended by: [place], DO on: 03/20/2020 12:09 PM  Actions taken: Clinical Note Signed

## 2020-03-21 ENCOUNTER — Telehealth: Payer: Self-pay | Admitting: Internal Medicine

## 2020-03-21 ENCOUNTER — Encounter: Payer: Self-pay | Admitting: Internal Medicine

## 2020-03-21 DIAGNOSIS — C3411 Malignant neoplasm of upper lobe, right bronchus or lung: Secondary | ICD-10-CM

## 2020-03-21 DIAGNOSIS — C7931 Secondary malignant neoplasm of brain: Secondary | ICD-10-CM

## 2020-03-21 NOTE — Telephone Encounter (Signed)
On 12/23- I Spoke to pt-the potential delay in availability of his oral medication until late next week.  Patient will not start oral medication until after patient radiation.  Recommend proceeding with whole brain radiation as discussed with Dr. Sherlene Shams.  Plan start treatment next week. [12/27].  Patient states that he will keep his appointment with radiation as planned.  Patient started taking steroids dexamethasone 4 mg a day as prescribed by radiation oncology  I will also speak to Dr.Blackburn.    C- please schedule follow-up-week of Jan 10th-MD CBC CMP.

## 2020-03-21 NOTE — Addendum Note (Signed)
Addended by: Delice Bison E on: 03/21/2020 10:34 AM   Modules accepted: Orders

## 2020-03-21 NOTE — Telephone Encounter (Signed)
On 12/23-I restarted patient's daughter Caren Griffins regarding her father's change in treatment plan.  Proceed with whole brain radiation as planned for next week.  Hold off starting dabrafenib plus Mekinist until after radiation.  Daughter in agreement.  GB

## 2020-03-25 ENCOUNTER — Ambulatory Visit: Admission: RE | Admit: 2020-03-25 | Payer: PPO | Source: Ambulatory Visit

## 2020-03-25 DIAGNOSIS — C3411 Malignant neoplasm of upper lobe, right bronchus or lung: Secondary | ICD-10-CM | POA: Diagnosis not present

## 2020-03-25 DIAGNOSIS — C7931 Secondary malignant neoplasm of brain: Secondary | ICD-10-CM | POA: Diagnosis not present

## 2020-03-25 DIAGNOSIS — Z510 Encounter for antineoplastic radiation therapy: Secondary | ICD-10-CM | POA: Diagnosis not present

## 2020-03-25 NOTE — Telephone Encounter (Signed)
[name] Encounter  Patient will receive free trial of Mekinist and Tafinlar on 03/26/20.  Patient is aware to hold medication until Dr Rogue Bussing instructs him to start.  He knows to continue with plan for radiation first.  [facility] [place] Phone [phone] Fax [phone] 03/25/2020 2:36 PM

## 2020-03-26 ENCOUNTER — Ambulatory Visit
Admission: RE | Admit: 2020-03-26 | Discharge: 2020-03-26 | Disposition: A | Discharge status: Home / Self Care | Payer: PPO | Source: Ambulatory Visit | Attending: Radiation Oncology | Admitting: Radiation Oncology

## 2020-03-26 DIAGNOSIS — Z510 Encounter for antineoplastic radiation therapy: Secondary | ICD-10-CM | POA: Diagnosis not present

## 2020-03-27 ENCOUNTER — Ambulatory Visit
Admission: RE | Admit: 2020-03-27 | Discharge: 2020-03-27 | Disposition: A | Discharge status: Home / Self Care | Payer: PPO | Source: Ambulatory Visit | Attending: Internal Medicine | Admitting: Internal Medicine

## 2020-03-27 ENCOUNTER — Other Ambulatory Visit: Payer: Self-pay

## 2020-03-27 ENCOUNTER — Institutional Professional Consult (permissible substitution): Payer: PPO | Admitting: Radiation Oncology

## 2020-03-27 ENCOUNTER — Ambulatory Visit
Admission: RE | Admit: 2020-03-27 | Discharge: 2020-03-27 | Disposition: A | Discharge status: Home / Self Care | Payer: PPO | Source: Ambulatory Visit | Attending: Radiation Oncology | Admitting: Radiation Oncology

## 2020-03-27 DIAGNOSIS — C7931 Secondary malignant neoplasm of brain: Secondary | ICD-10-CM | POA: Diagnosis not present

## 2020-03-27 DIAGNOSIS — Z510 Encounter for antineoplastic radiation therapy: Secondary | ICD-10-CM | POA: Diagnosis not present

## 2020-03-27 DIAGNOSIS — Z5111 Encounter for antineoplastic chemotherapy: Secondary | ICD-10-CM | POA: Diagnosis not present

## 2020-03-27 DIAGNOSIS — C3411 Malignant neoplasm of upper lobe, right bronchus or lung: Secondary | ICD-10-CM | POA: Diagnosis not present

## 2020-03-27 IMAGING — NM NM CARDIA MUGA REST
7 series · 27 of 27 positions shown · non-contrast
Comparison: None.

CLINICAL DATA: Chemotherapy patient. Assess left ventricular
function.

EXAM:
NUCLEAR MEDICINE CARDIAC BLOOD POOL IMAGING (MUGA)
TECHNIQUE: Cardiac multi-gated acquisition was performed at rest following
intravenous injection of [CL] labeled red blood cells.
RADIOPHARMACEUTICALS:  20.54 mCi [CL] pertechnetate in-vitro
labeled red blood cells IV

[Series 1000: 45 lao-gated (results) · 3.30mm/px · 6 of 24 frames shown]
[frame 3/24]
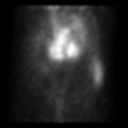
[frame 7/24]
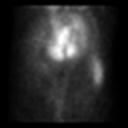
[frame 11/24]
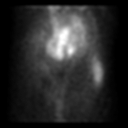
[frame 15/24]
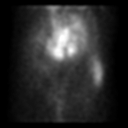
[frame 19/24]
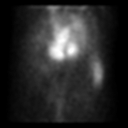
[frame 23/24]
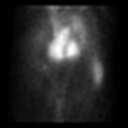

[Series 1000: 70 lao-gated · 3.30mm/px · 6 of 24 frames shown]
[frame 3/24]
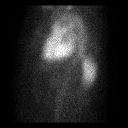
[frame 7/24]
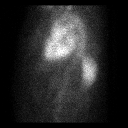
[frame 11/24]
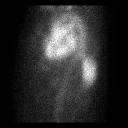
[frame 15/24]
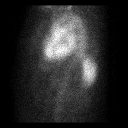
[frame 19/24]
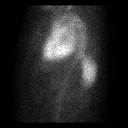
[frame 23/24]
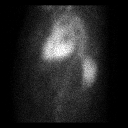

[Series 1000: 70 lao · 3.30mm/px · 1 of 1 slices shown]
[im 1/1]
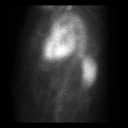

[Series 1000: anterior · 3.30mm/px · 1 of 1 slices shown]
[im 1/1]
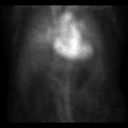

[Series 1000: anterior-gated · 3.30mm/px · 6 of 24 frames shown]
[frame 3/24]
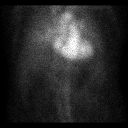
[frame 7/24]
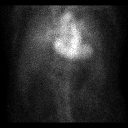
[frame 11/24]
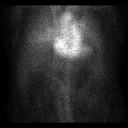
[frame 15/24]
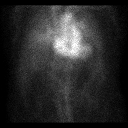
[frame 19/24]
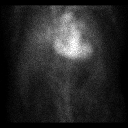
[frame 23/24]
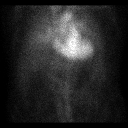

[Series 1000: 45 lao-gated · 3.30mm/px · 6 of 24 frames shown]
[frame 3/24]
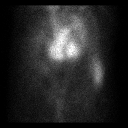
[frame 7/24]
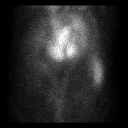
[frame 11/24]
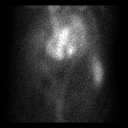
[frame 15/24]
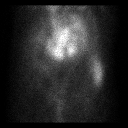
[frame 19/24]
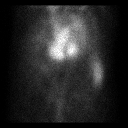
[frame 23/24]
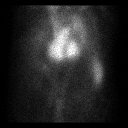

[Series 1000: 45 lao · 3.30mm/px · 1 of 1 slices shown]
[im 1/1]
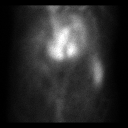

[27 of 27 positions shown; findings below may reference images not displayed]

FINDINGS: Normal left ventricular wall motion is demonstrated. No akinetic or
dyskinetic findings. Ejection fraction is estimated at 58.4%.
IMPRESSION: Normal left ventricular wall motion. Estimated ejection fraction is
58.4%.

## 2020-03-27 MED ORDER — TECHNETIUM TC 99M-LABELED RED BLOOD CELLS IV KIT
20.0000 | PACK | Freq: Once | INTRAVENOUS | Status: AC | PRN
  Administered 2020-03-27: 14:00:00 20.542 via INTRAVENOUS

## 2020-03-27 NOTE — Telephone Encounter (Addendum)
[name] Encounter  Received notification from [employer] that patient has been successfully enrolled into their program to receive [place] from the manufacturer at $0 out of pocket until 03/29/21.  Patients application will be re-evaluated to determine eligibility for next year.  Specialty Pharmacy that will dispense medication is [facility] by [employer].  Patient knows to call the office with questions or concerns.   Oral Oncology Clinic will continue to follow.  Curtis Andrade Phone [phone] Fax [phone] 03/27/2020 11:22 AM

## 2020-03-28 ENCOUNTER — Ambulatory Visit
Admission: RE | Admit: 2020-03-28 | Discharge: 2020-03-28 | Disposition: A | Discharge status: Home / Self Care | Payer: PPO | Source: Ambulatory Visit | Attending: Radiation Oncology | Admitting: Radiation Oncology

## 2020-03-28 DIAGNOSIS — Z510 Encounter for antineoplastic radiation therapy: Secondary | ICD-10-CM | POA: Diagnosis not present

## 2020-04-01 ENCOUNTER — Ambulatory Visit
Admission: RE | Admit: 2020-04-01 | Discharge: 2020-04-01 | Disposition: A | Discharge status: Home / Self Care | Payer: PPO | Source: Ambulatory Visit | Attending: Radiation Oncology | Admitting: Radiation Oncology

## 2020-04-01 DIAGNOSIS — Z510 Encounter for antineoplastic radiation therapy: Secondary | ICD-10-CM | POA: Insufficient documentation

## 2020-04-01 DIAGNOSIS — C7931 Secondary malignant neoplasm of brain: Secondary | ICD-10-CM | POA: Diagnosis not present

## 2020-04-02 ENCOUNTER — Ambulatory Visit
Admission: RE | Admit: 2020-04-02 | Discharge: 2020-04-02 | Disposition: A | Discharge status: Home / Self Care | Payer: PPO | Source: Ambulatory Visit | Attending: Radiation Oncology | Admitting: Radiation Oncology

## 2020-04-02 DIAGNOSIS — C3411 Malignant neoplasm of upper lobe, right bronchus or lung: Secondary | ICD-10-CM | POA: Diagnosis not present

## 2020-04-02 DIAGNOSIS — Z510 Encounter for antineoplastic radiation therapy: Secondary | ICD-10-CM | POA: Diagnosis not present

## 2020-04-02 DIAGNOSIS — C7931 Secondary malignant neoplasm of brain: Secondary | ICD-10-CM | POA: Diagnosis not present

## 2020-04-03 ENCOUNTER — Ambulatory Visit
Admission: RE | Admit: 2020-04-03 | Discharge: 2020-04-03 | Disposition: A | Discharge status: Home / Self Care | Payer: PPO | Source: Ambulatory Visit | Attending: Radiation Oncology | Admitting: Radiation Oncology

## 2020-04-03 DIAGNOSIS — Z510 Encounter for antineoplastic radiation therapy: Secondary | ICD-10-CM | POA: Diagnosis not present

## 2020-04-04 ENCOUNTER — Ambulatory Visit
Admission: RE | Admit: 2020-04-04 | Discharge: 2020-04-04 | Disposition: A | Discharge status: Home / Self Care | Payer: PPO | Source: Ambulatory Visit | Attending: Radiation Oncology | Admitting: Radiation Oncology

## 2020-04-04 DIAGNOSIS — Z510 Encounter for antineoplastic radiation therapy: Secondary | ICD-10-CM | POA: Diagnosis not present

## 2020-04-05 ENCOUNTER — Ambulatory Visit
Admission: RE | Admit: 2020-04-05 | Discharge: 2020-04-05 | Disposition: A | Discharge status: Home / Self Care | Payer: PPO | Source: Ambulatory Visit | Attending: Radiation Oncology | Admitting: Radiation Oncology

## 2020-04-05 DIAGNOSIS — Z510 Encounter for antineoplastic radiation therapy: Secondary | ICD-10-CM | POA: Diagnosis not present

## 2020-04-08 ENCOUNTER — Ambulatory Visit: Payer: PPO | Admitting: Internal Medicine

## 2020-04-08 ENCOUNTER — Ambulatory Visit
Admission: RE | Admit: 2020-04-08 | Discharge: 2020-04-08 | Disposition: A | Discharge status: Home / Self Care | Payer: PPO | Source: Ambulatory Visit | Attending: Radiation Oncology | Admitting: Radiation Oncology

## 2020-04-08 ENCOUNTER — Other Ambulatory Visit: Payer: PPO

## 2020-04-08 DIAGNOSIS — M216X1 Other acquired deformities of right foot: Secondary | ICD-10-CM | POA: Diagnosis not present

## 2020-04-08 DIAGNOSIS — M216X2 Other acquired deformities of left foot: Secondary | ICD-10-CM | POA: Diagnosis not present

## 2020-04-08 DIAGNOSIS — Z510 Encounter for antineoplastic radiation therapy: Secondary | ICD-10-CM | POA: Diagnosis not present

## 2020-04-08 DIAGNOSIS — M7731 Calcaneal spur, right foot: Secondary | ICD-10-CM | POA: Diagnosis not present

## 2020-04-08 DIAGNOSIS — M79671 Pain in right foot: Secondary | ICD-10-CM | POA: Diagnosis not present

## 2020-04-08 DIAGNOSIS — M722 Plantar fascial fibromatosis: Secondary | ICD-10-CM | POA: Diagnosis not present

## 2020-04-09 ENCOUNTER — Inpatient Hospital Stay: Payer: PPO | Admitting: Pharmacist

## 2020-04-09 ENCOUNTER — Inpatient Hospital Stay (HOSPITAL_BASED_OUTPATIENT_CLINIC_OR_DEPARTMENT_OTHER): Payer: PPO | Admitting: Internal Medicine

## 2020-04-09 ENCOUNTER — Ambulatory Visit
Admission: RE | Admit: 2020-04-09 | Discharge: 2020-04-09 | Disposition: A | Discharge status: Home / Self Care | Payer: PPO | Source: Ambulatory Visit | Attending: Radiation Oncology | Admitting: Radiation Oncology

## 2020-04-09 ENCOUNTER — Other Ambulatory Visit: Payer: Self-pay

## 2020-04-09 ENCOUNTER — Inpatient Hospital Stay: Payer: PPO

## 2020-04-09 ENCOUNTER — Inpatient Hospital Stay: Payer: PPO | Attending: Internal Medicine

## 2020-04-09 VITALS — BP 163/79 | HR 60 | Temp 98.0°F | Resp 18

## 2020-04-09 DIAGNOSIS — C7951 Secondary malignant neoplasm of bone: Secondary | ICD-10-CM | POA: Insufficient documentation

## 2020-04-09 DIAGNOSIS — C7889 Secondary malignant neoplasm of other digestive organs: Secondary | ICD-10-CM | POA: Diagnosis not present

## 2020-04-09 DIAGNOSIS — C7989 Secondary malignant neoplasm of other specified sites: Secondary | ICD-10-CM | POA: Insufficient documentation

## 2020-04-09 DIAGNOSIS — G893 Neoplasm related pain (acute) (chronic): Secondary | ICD-10-CM | POA: Diagnosis not present

## 2020-04-09 DIAGNOSIS — C792 Secondary malignant neoplasm of skin: Secondary | ICD-10-CM | POA: Diagnosis not present

## 2020-04-09 DIAGNOSIS — Z79899 Other long term (current) drug therapy: Secondary | ICD-10-CM | POA: Insufficient documentation

## 2020-04-09 DIAGNOSIS — Z510 Encounter for antineoplastic radiation therapy: Secondary | ICD-10-CM | POA: Diagnosis not present

## 2020-04-09 DIAGNOSIS — C3411 Malignant neoplasm of upper lobe, right bronchus or lung: Secondary | ICD-10-CM | POA: Insufficient documentation

## 2020-04-09 DIAGNOSIS — I499 Cardiac arrhythmia, unspecified: Secondary | ICD-10-CM | POA: Diagnosis not present

## 2020-04-09 DIAGNOSIS — Z87891 Personal history of nicotine dependence: Secondary | ICD-10-CM | POA: Insufficient documentation

## 2020-04-09 DIAGNOSIS — C787 Secondary malignant neoplasm of liver and intrahepatic bile duct: Secondary | ICD-10-CM | POA: Diagnosis not present

## 2020-04-09 DIAGNOSIS — Z7901 Long term (current) use of anticoagulants: Secondary | ICD-10-CM | POA: Insufficient documentation

## 2020-04-09 DIAGNOSIS — C7931 Secondary malignant neoplasm of brain: Secondary | ICD-10-CM | POA: Diagnosis not present

## 2020-04-09 DIAGNOSIS — C7972 Secondary malignant neoplasm of left adrenal gland: Secondary | ICD-10-CM | POA: Diagnosis not present

## 2020-04-09 LAB — COMPREHENSIVE METABOLIC PANEL
ALT: 21 U/L (ref 0–44)
AST: 21 U/L (ref 15–41)
Albumin: 3.8 g/dL (ref 3.5–5.0)
Alkaline Phosphatase: 66 U/L (ref 38–126)
Anion gap: 6 (ref 5–15)
BUN: 23 mg/dL (ref 8–23)
CO2: 29 mmol/L (ref 22–32)
Calcium: 8.9 mg/dL (ref 8.9–10.3)
Chloride: 102 mmol/L (ref 98–111)
Creatinine, Ser: 1.12 mg/dL (ref 0.61–1.24)
GFR, Estimated: >60 mL/min (ref >60)
Glucose, Bld: 89 mg/dL (ref 70–99)
Potassium: 4.3 mmol/L (ref 3.5–5.1)
Sodium: 137 mmol/L (ref 135–145)
Total Bilirubin: 0.3 mg/dL (ref 0.3–1.2)
Total Protein: 7.4 g/dL (ref 6.5–8.1)

## 2020-04-09 LAB — CBC WITH DIFFERENTIAL/PLATELET
Abs Immature Granulocytes: 0.03 10*3/uL (ref 0.00–0.07)
Basophils Absolute: 0.1 10*3/uL (ref 0.0–0.1)
Basophils Relative: 1 %
Eosinophils Absolute: 0.2 10*3/uL (ref 0.0–0.5)
Eosinophils Relative: 2 %
HCT: 42.7 % (ref 39.0–52.0)
Hemoglobin: 14.1 g/dL (ref 13.0–17.0)
Immature Granulocytes: 0 %
Lymphocytes Relative: 13 %
Lymphs Abs: 1 10*3/uL (ref 0.7–4.0)
MCH: 30.9 pg (ref 26.0–34.0)
MCHC: 33 g/dL (ref 30.0–36.0)
MCV: 93.6 fL (ref 80.0–100.0)
Monocytes Absolute: 0.6 10*3/uL (ref 0.1–1.0)
Monocytes Relative: 8 %
Neutro Abs: 5.9 10*3/uL (ref 1.7–7.7)
Neutrophils Relative %: 76 %
Platelets: 196 10*3/uL (ref 150–400)
RBC: 4.56 MIL/uL (ref 4.22–5.81)
RDW: 15.4 % (ref 11.5–15.5)
WBC: 7.8 10*3/uL (ref 4.0–10.5)
nRBC: 0 % (ref 0.0–0.2)

## 2020-04-09 MED ORDER — ACETAMINOPHEN 325 MG PO TABS
650.0000 mg | ORAL_TABLET | Freq: Once | ORAL | Status: AC
  Administered 2020-04-09: 650 mg via ORAL

## 2020-04-09 MED ORDER — SODIUM CHLORIDE 0.9 % IV SOLN
Freq: Once | INTRAVENOUS | Status: AC
  Filled 2020-04-09: qty 250

## 2020-04-09 MED ORDER — ZOLEDRONIC ACID 4 MG/100ML IV SOLN
4.0000 mg | Freq: Once | INTRAVENOUS | Status: AC
  Administered 2020-04-09: 4 mg via INTRAVENOUS
  Filled 2020-04-09: qty 100

## 2020-04-09 NOTE — Progress Notes (Signed)
[place]  [phone] [phone]  Patient Care Team: [name], MD as PCP - General (Family Medicine) [name], MD as Consulting Physician (Family Medicine) [name], [name], MD (General Surgery) Thornton Park, DO as Consulting Physician (Radiation Oncology)   Name of the patient: Curtis Andrade  [id number]  [birth year]   Date of visit: 04/09/20  HPI: Patient is a 75 y.o. male with BRAF positive metastatic NSCLC. Patient just finished radiation treatment. Plan to initiate treatment with Mekinist (trametinib) and Tafinlar (dabrafenib) on Thursday 04/11/20  Reason for Consult: Mekinist and Tafinlar oral chemotherapy education.   PAST MEDICAL HISTORY: Past Medical History:  Diagnosis Date  . 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 Colon polyp 2013  . Hemorrhoids    pt denies  . 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 Baruch Gouty and Dr. Nadeen Landau  . Phlebitis 1998  . Primary cancer of right upper lobe of lung ([place]) 01/14/2017   Dr. Genevive Bi performed a wedge resection of RUL.   . 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/27/2016    PAST SURGICAL HISTORY:  Past Surgical History:  Procedure Laterality Date  . COLONOSCOPY  2013   Dr Vira Agar  . COLONOSCOPY WITH PROPOFOL N/A 12/23/2016   Procedure: COLONOSCOPY WITH PROPOFOL;  Surgeon: Robert Bellow, MD;  Location: [facility] END[facility];  Service: Endoscopy;  Laterality: N/A;  . EYE SURGERY    . HERNIA REPAIR Right 1990's  . TENDON REPAIR Left 03/14/2019   Procedure: TENDON REPAIR AND GRAFT  TRANSFER EXTENSOR INDICIS PROPRIUS LEFT THUMB;  Surgeon: [name], MD;  Location: [facility];  Service: Orthopedics;  Laterality: Left;  AXILLARY BLOCK  . THORACOTOMY Right 01/14/2017   Procedure: RIGHT THORACOSCOPYWITH WIDE WEDGE RESECTION,  PREOP BROCHOSCOPY;   Surgeon: [name], MD;  Location: [facility] ORS;  Service: General;  Laterality: Right;  . THROAT SURGERY  2010    HEMATOLOGY/ONCOLOGY HISTORY:  Oncology History Overview Note  # OCT 2018- STAGE I- A. 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  - PARENCHYMAL MARGIN APPEARS CLEAR; TUMOR WAS 1 CM FROM MARGIN BEFORE REMOVAL OF STAPLE LINE.; NO adjuvant therapy.   # DEC 2021-- METASTATIC CARCINOMA, COMPATIBLE WITH PULMONARY ADENOCARCINOMA [left shoulder Core Biopsy];  NOV 24th PET-right hilar and Hypermetabolic mediastinal lymphadenopathy; small pulmonary nodules; 2 metastatic lesions in the liver/  pancreatic head/left adrenal gland;  muscle and skeletal metastasis.  Subcutaneous nodule-positive for metastatic adeno lung.   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  Short course of Eliquis.smoking-quit 2008.  ---------------------------------------    DIAGNOSIS: LUNG CA  STAGE:  IV      ;GOALS:palliation  CURRENT/MOST RECENT THERAPY: DAB+TREM    Primary cancer of right upper lobe of lung ([place])  04/09/2020 Cancer Staging   Staging form: Lung, AJCC 8th Edition - Clinical: Stage IVB (cN1, cM1c) - Signed by Cammie Sickle, MD on 04/09/2020     ALLERGIES:  has No Known Allergies.  MEDICATIONS:  Current Outpatient Medications  Medication Sig Dispense Refill  . Ascorbic Acid (VITAMIN C) 1000 MG tablet Take 1,000 mg by  mouth daily.    Marland Kitchen aspirin EC 81 MG tablet Take 81 mg by mouth daily.    Marland Kitchen dabrafenib mesylate (TAFINLAR) 75 MG capsule Take 2 capsules (150 mg total) by mouth 2 (two) times daily. Take on an empty stomach 1  hour before or 2 hours after meals. (Patient not taking: Reported on 04/09/2020) 295 capsule 3  . folic acid (FOLVITE) 284 MCG tablet Take 800 mcg by mouth daily.    . Glucosamine-Chondroitin (COSAMIN DS PO) Take 1 tablet by mouth daily.    . Multiple Vitamin (MULTIVITAMIN WITH MINERALS) TABS tablet Take 1 tablet by mouth daily. Senior Multivitamin.    . niacinamide 500 MG tablet Take 500 mg by mouth 2 (two) times daily with a meal.    . Omega-3 1000 MG CAPS Take 1 tablet by mouth 1 day or 1 dose.    Marland Kitchen omeprazole (PRILOSEC) 40 MG capsule Take 40 mg by mouth daily before supper.     . trametinib dimethyl sulfoxide (MEKINIST) 2 MG tablet Take 1 tablet (2 mg total) by mouth daily. Take 1 hour before or 2 hours after a meal. Store refrigerated in original container. (Patient not taking: Reported on 04/09/2020) 30 tablet 3  . vitamin E 400 UNIT capsule Take 1 capsule by mouth 1 day or 1 dose.     No current facility-administered medications for this visit.    VITAL SIGNS: There were no vitals taken for this visit. There were no vitals filed for this visit.  Estimated body mass index is 29.01 kg/m as calculated from the following:   Height as of an earlier encounter on 04/09/20: '5\' 11"'  (1.803 m).   Weight as of an earlier encounter on 04/09/20: 94.3 kg (208 lb).  LABS: CBC:    Component Value Date/Time   WBC 7.8 04/09/2020 1436   HGB 14.1 04/09/2020 1436   HCT 42.7 04/09/2020 1436   PLT 196 04/09/2020 1436   MCV 93.6 04/09/2020 1436   NEUTROABS 5.9 04/09/2020 1436   LYMPHSABS 1.0 04/09/2020 1436   MONOABS 0.6 04/09/2020 1436   EOSABS 0.2 04/09/2020 1436   BASOSABS 0.1 04/09/2020 1436   Comprehensive Metabolic Panel:    Component Value Date/Time   NA 137 04/09/2020 1436   K 4.3 04/09/2020 1436   CL 102 04/09/2020 1436   CO2 29 04/09/2020 1436   BUN 23 04/09/2020 1436   CREATININE 1.12 04/09/2020 1436   GLUCOSE 89 04/09/2020 1436   CALCIUM 8.9 04/09/2020 1436   AST 21 04/09/2020  1436   ALT 21 04/09/2020 1436   ALKPHOS 66 04/09/2020 1436   BILITOT 0.3 04/09/2020 1436   PROT 7.4 04/09/2020 1436   ALBUMIN 3.8 04/09/2020 1436    RADIOGRAPHIC STUDIES: MR Brain W Wo Contrast  Result Date: 03/18/2020 CLINICAL DATA:  Cell cancer.  Pretreatment staging. EXAM: MRI HEAD WITHOUT AND WITH CONTRAST TECHNIQUE: Multiplanar, multiecho pulse sequences of the brain and surrounding structures were obtained without and with intravenous contrast. CONTRAST:  62m GADAVIST GADOBUTROL 1 MMOL/ML IV SOLN COMPARISON:  None. FINDINGS: Brain: Multiple ring-enhancing lesions present, consistent metastatic disease. A 7 mm lesion is present in the anterior inferior right cerebellum image 80 series 18. Bilateral cerebellar lesions are best seen on the susceptibility weighted images, image 13 of series 13 coronal image 12 series 19. A probable 3 mm lesion is present in the anterior pons on image 53 of series 18. A 5.5 mm lesion is present in the posterior right temporal lobe/occipital lobe image  81 series 18. A 4 mm lesion is present in the anterior right temporal lobe on image 65 series 18 A 6 mm cortical lesion is present in the posterior left parietal lobe image 16 of series 18. A 7.5 mm lesion is present slightly more superiorly in the left parietal lobe on image 120. A punctate left parietal lesion is present image 129. A 3.5 mm lesion is present in the posteromedial right parietal lobe image 29. A 3 mm lesion is present in the anterior left frontal lobe on image 30 Focal susceptibility is associated with several of the lesions compatible with remote hemorrhage. No acute hemorrhage is present. T2 signal changes are associated with several of the larger lesions as well. Moderate generalized atrophy and white matter disease is also present. The ventricles are proportionate to the degree of atrophy. No significant extraaxial fluid collection is present. The internal auditory canals are within normal limits.  Vascular: Insert flow Skull and upper cervical spine: The craniocervical junction is normal. Upper cervical spine is within normal limits. Marrow signal is unremarkable. Sinuses/Orbits: Mild mucosal thickening is present throughout the anterior ethmoid air cells and right frontal sinus. No fluid levels are present. The paranasal sinuses and mastoid air cells are otherwise clear. Bilateral lens replacements are noted. Globes and orbits are otherwise unremarkable. IMPRESSION: 1. And least 11 ring-enhancing lesions consistent with metastatic disease to the brain. 2. Susceptibility associated with the majority of lesions is consistent with prior hemorrhage within the metastases. 3. Moderate generalized atrophy and white matter disease likely reflects the sequela of chronic microvascular ischemia. 4. Mild anterior ethmoid and right frontal sinus disease. Electronically Signed   By: San Morelle M.D.   On: 03/18/2020 08:38   NM Cardiac Muga Rest  Result Date: 03/28/2020 CLINICAL DATA:  Chemotherapy patient. Assess left ventricular function. EXAM: NUCLEAR MEDICINE CARDIAC BLOOD POOL IMAGING (MUGA) TECHNIQUE: Cardiac multi-gated acquisition was performed at rest following intravenous injection of Tc-36mlabeled red blood cells. RADIOPHARMACEUTICALS:  20.54 mCi Tc-946mertechnetate in-vitro labeled red blood cells IV COMPARISON:  None. FINDINGS: Normal left ventricular wall motion is demonstrated. No akinetic or dyskinetic findings. Ejection fraction is estimated at 58.4%. IMPRESSION: Normal left ventricular wall motion. Estimated ejection fraction is 58.4%. Electronically Signed   By: P.Marijo Sanes.D.   On: 03/28/2020 08:01     Assessment and Plan-  Plan to initiate treatment with Mekinist (trametinib) and Tafinlar (dabrafenib) on Thursday 04/11/20   Patient Education I spoke with patient, his wife, and his daughter (via phone video) for overview of new oral chemotherapy medication: Mekinist  (trametinib) and Tafinlar (dabrafenib) for the treatment of metastatic NSCLC, BRAF V600E positive, planned duration until disease progression or unacceptable drug toxicity.   Counseled patient on administration, dosing, side effects, monitoring, drug-food interactions, safe handling, storage, and disposal.  Patient will take: Mekinist: Take 1 tablet (2 mg total) by mouth daily. Take 1 hour before or 2 hours after a meal. Store refrigerated in orFinancial controllerTake 2 capsules (150 mg total) by mouth 2 (two) times daily. Take on an empty stomach 1 hour before or 2 hours after meals.  Side effects include but not limited to: rash, decreased wbc, fatigue, diarrhea, edema, drug fever.    Reviewed with patient importance of keeping a medication schedule and plan for any missed doses.  After discussion with patient no patient barriers to medication adherence identified.   Mr. ChSalviaoiced understanding and appreciation. All questions answered. Medication handout provided.  Provided patient  with Oral Chemotherapy Navigation Clinic phone number. Patient knows to call the office with questions or concerns. Oral Chemotherapy Navigation Clinic will continue to follow.  Medication Access Issues: none. Patient was enrolled in manufacturer assistance and has medication on hand to get started  Patient expressed understanding and was in agreement with this plan. He also understands that He can call clinic at any time with any questions, concerns, or complaints.   Thank you for allowing me to participate in the care of this very pleasant patient.   Time Total: 20 mins  Visit consisted of counseling and education on dealing with issues of symptom management in the setting of serious and potentially life-threatening illness.Greater than 50%  of this time was spent counseling and coordinating care related to the above assessment and plan.  Signed by: Darl Pikes, PharmD, BCPS, Salley Slaughter,  CPP Hematology/Oncology Clinical Pharmacist Practitioner [facility] [phone]  04/09/2020 4:43 PM

## 2020-04-09 NOTE — Progress Notes (Signed)
Per MD order 650 mg acetaminophen given prior to hanging Zoledronic acid. Rate of infusion was slowed to 30 minutes instead of 15 minutes. Hopefully this will decrease fever and body aches post medication. No complaints at time of discharge.

## 2020-04-09 NOTE — Assessment & Plan Note (Addendum)
#  Stage IV/recurrent adenocarcinoma of the lung/ mediastinal lymphadenopathy/liver/  pancreatic head/left adrenal gland/ muscle and skeletal metastasis/Subcutaneous nodule/Brain.  # NGS/F-One: B-RAF V600E; PDL-1-30%; recommend dabrafenib plus Mekinist. Discussed with pharmacy.  I again reviewed the potential side effects of TKI-therapy including but not limited to fevers skin rash diarrhea; risk of squamous cell cancers.  MUGA scan-58% ejection fraction.  Understand treatments are palliative; not curative.  Discussed with pharmacy.  We will plan to start on 04/12/2020-given previous infusion reaction to Zometa.  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  We will plan brain MRI in the next month or so.  # Bone lesions-mildly symptomatic; continue Zometa every 4 weeks [30 mins; tylenol].  Proceed with infusion today; premedicated with Tylenol.  #Discussed with patient/wife and daughter.   # DISPOSITION:  # follow up in 3 weeks; MD- labs- cbc/cmp-Dr.B

## 2020-04-09 NOTE — Progress Notes (Signed)
[facility] CONSULT NOTE  Patient Care Team: Jerl Mina, MD as PCP - General (Family Medicine) Jerl Mina, MD as Consulting Physician (Family Medicine) Lemar Livings, Merrily Pew, MD (General Surgery) Laurine Blazer, DO as Consulting Physician (Radiation Oncology)  CHIEF COMPLAINTS/PURPOSE OF CONSULTATION: Lung cancer  #  Oncology History Overview Note  # OCT 2018- STAGE I- A. 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  - PARENCHYMAL MARGIN APPEARS CLEAR; TUMOR WAS 1 CM FROM MARGIN BEFORE REMOVAL OF STAPLE LINE.; NO adjuvant therapy.   # DEC 2021-- METASTATIC CARCINOMA, COMPATIBLE WITH PULMONARY ADENOCARCINOMA [left shoulder Core Biopsy];  NOV 24th PET-right hilar and Hypermetabolic mediastinal lymphadenopathy; small pulmonary nodules; 2 metastatic lesions in the liver/  pancreatic head/left adrenal gland;  muscle and skeletal metastasis.  Subcutaneous nodule-positive for metastatic adeno lung.   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  Short course of Eliquis.smoking-quit 2008.  ---------------------------------------    DIAGNOSIS: LUNG CA  STAGE:  IV      ;GOALS:palliation  CURRENT/MOST RECENT THERAPY: DAB+TREM    Primary cancer of right upper lobe of lung (HCC)  04/09/2020 Cancer Staging   Staging form: Lung, AJCC 8th Edition - Clinical: Stage IVB (cN1, cM1c) - Signed by Earna Coder, MD on 04/09/2020      HISTORY OF PRESENTING ILLNESS:  Curtis Andrade. 75 y.o.  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   Patiently currently status post whole brain radiation-last treatment today.  Patient tolerated addition fairly well.  Denies any nausea vomiting denies any headaches.  Currently off steroids.  Complains of mild back pain joint pains-improved with Tylenol.  Review of Systems  Constitutional: Negative for chills, diaphoresis, fever, malaise/fatigue and weight loss.  HENT: Negative for nosebleeds and sore throat.   Eyes: Negative for double vision.  Respiratory: Negative for cough, hemoptysis, sputum production, shortness of breath and wheezing.   Cardiovascular: Negative for chest pain, palpitations, orthopnea and leg swelling.  Gastrointestinal: Negative for abdominal pain, blood in stool, constipation, diarrhea, heartburn, melena, nausea and vomiting.  Genitourinary: Negative for dysuria, frequency and urgency.  Musculoskeletal: Positive for back pain and joint pain.  Skin: Negative.  Negative for itching and rash.  Neurological: Negative for dizziness, tingling, focal weakness, weakness and headaches.  Endo/Heme/Allergies: Does not bruise/bleed easily.  Psychiatric/Behavioral: Negative for depression. The patient is not nervous/anxious and does not have insomnia.      MEDICAL HISTORY:  Past Medical History:  Diagnosis Date  . 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 Colon polyp 2013  . Hemorrhoids    pt denies  . 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 Rushie Chestnut and Dr. Gertie Baron  . Phlebitis 1998  . Primary cancer of right upper lobe of lung (HCC) 01/14/2017   Dr. Thelma Barge performed a wedge resection of RUL.   . 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/27/2016    SURGICAL HISTORY: Past Surgical History:  Procedure Laterality Date  . COLONOSCOPY  2013  Dr Mechele Collin  . COLONOSCOPY WITH PROPOFOL N/A  12/23/2016   Procedure: COLONOSCOPY WITH PROPOFOL;  Surgeon: Earline Mayotte, MD;  Location: [facility];  Service: Endoscopy;  Laterality: N/A;  . EYE SURGERY    . HERNIA REPAIR Right 1990's  . TENDON REPAIR Left 03/14/2019   Procedure: TENDON REPAIR AND GRAFT  TRANSFER EXTENSOR INDICIS PROPRIUS LEFT THUMB;  Surgeon: Cindee Salt, MD;  Location:  SURGERY CENTER;  Service: Orthopedics;  Laterality: Left;  AXILLARY BLOCK  . THORACOTOMY Right 01/14/2017   Procedure: RIGHT THORACOSCOPYWITH WIDE WEDGE RESECTION,  PREOP BROCHOSCOPY;  Surgeon: Hulda Marin, MD;  Location: [facility] ORS;  Service: General;  Laterality: Right;  . THROAT SURGERY  2010    SOCIAL HISTORY: Social History   Socioeconomic History  . Marital status: Married    Spouse name: Not on file  . Number of children: Not on file  . Years of education: Not on file  . Highest education level: Not on file  Occupational History  . Not on file  Tobacco Use  . Smoking status: Former Smoker    Years: 50.00    Quit date: 03/31/2007    Years since quitting: 13.0  . Smokeless tobacco: Never Used  Vaping Use  . Vaping Use: Never used  Substance and Sexual Activity  . Alcohol use: No  . Drug use: No  . Sexual activity: Not on file  Other Topics Concern  . Not on file  Social History Narrative  . Not on file   Social Determinants of Health   Financial Resource Strain: Not on file  Food Insecurity: Not on file  Transportation Needs: Not on file  Physical Activity: Not on file  Stress: Not on file  Social Connections: Not on file  Intimate Partner Violence: Not on file    FAMILY HISTORY: Family History  Problem Relation Age of Onset  . Alzheimer's disease Mother   . Alzheimer's disease Father   . Stroke Father   . Colon cancer Neg Hx     ALLERGIES:  has No Known Allergies.  MEDICATIONS:  Current Outpatient Medications  Medication Sig Dispense Refill  . Ascorbic Acid (VITAMIN C) 1000 MG tablet Take  1,000 mg by mouth daily.    Marland Kitchen aspirin EC 81 MG tablet Take 81 mg by mouth daily.    . folic acid (FOLVITE) 800 MCG tablet Take 800 mcg by mouth daily.    . Glucosamine-Chondroitin (COSAMIN DS PO) Take 1 tablet by mouth daily.    . Multiple Vitamin (MULTIVITAMIN WITH MINERALS) TABS tablet Take 1 tablet by mouth daily. Senior Multivitamin.    . niacinamide 500 MG tablet Take 500 mg by mouth 2 (two) times daily with a meal.    . Omega-3 1000 MG CAPS Take 1 tablet by mouth 1 day or 1 dose.    Marland Kitchen omeprazole (PRILOSEC) 40 MG capsule Take 40 mg by mouth daily before supper.     . vitamin E 400 UNIT capsule Take 1 capsule by mouth 1 day or 1 dose.    . dabrafenib mesylate (TAFINLAR) 75 MG capsule Take 2 capsules (150 mg total) by mouth 2 (two) times daily. Take on an empty stomach 1 hour before or 2 hours after meals. (Patient not taking: Reported on 04/09/2020) 120 capsule 3  . trametinib dimethyl sulfoxide (MEKINIST) 2 MG tablet Take 1 tablet (2 mg total) by mouth daily. Take 1 hour before or 2 hours after a meal. Store refrigerated in original container. (Patient not  taking: Reported on 04/09/2020) 30 tablet 3   No current facility-administered medications for this visit.      Marland Kitchen  PHYSICAL EXAMINATION: ECOG PERFORMANCE STATUS: 0 - Asymptomatic  Vitals:   04/09/20 1520  BP: (!) 174/91  Pulse: 75  Resp: 20  Temp: 98.2 F (36.8 C)   Filed Weights   04/09/20 1520  Weight: 208 lb (94.3 kg)    Physical Exam Constitutional:      Comments: Ambulating by independently.  Accompanied by his wife.  One of his daughter over the phone/FaceTime.  HENT:     Head: Normocephalic and atraumatic.     Mouth/Throat:     Pharynx: No oropharyngeal exudate.  Eyes:     Pupils: Pupils are equal, round, and reactive to light.  Cardiovascular:     Rate and Rhythm: Normal rate and regular rhythm.  Pulmonary:     Effort: Pulmonary effort is normal. No respiratory distress.     Breath sounds: Normal breath  sounds. No wheezing.  Abdominal:     General: Bowel sounds are normal. There is no distension.     Palpations: Abdomen is soft. There is no mass.     Tenderness: There is no abdominal tenderness. There is no guarding or rebound.  Musculoskeletal:        General: No tenderness. Normal range of motion.     Cervical back: Normal range of motion and neck supple.  Skin:    General: Skin is warm.  Neurological:     Mental Status: He is alert and oriented to person, place, and time.  Psychiatric:        Mood and Affect: Affect normal.   ;  LABORATORY DATA:  I have reviewed the data as listed Lab Results  Component Value Date   WBC 7.8 04/09/2020   HGB 14.1 04/09/2020   HCT 42.7 04/09/2020   MCV 93.6 04/09/2020   PLT 196 04/09/2020   Recent Labs    02/09/20 1011 02/12/20 0941 04/09/20 1436  NA  --  135 137  K  --  4.6 4.3  CL  --  101 102  CO2  --  27 29  GLUCOSE  --  104* 89  BUN  --  15 23  CREATININE 1.10 0.95 1.12  CALCIUM  --  8.9 8.9  GFRNONAA  --  >60 >60  PROT  --  7.7 7.4  ALBUMIN  --  4.0 3.8  AST  --  28 21  ALT  --  24 21  ALKPHOS  --  51 66  BILITOT  --  0.7 0.3    RADIOGRAPHIC STUDIES: I have personally reviewed the radiological images as listed and agreed with the findings in the report. MR Brain W Wo Contrast  Result Date: 03/18/2020 CLINICAL DATA:  Cell cancer.  Pretreatment staging. EXAM: MRI HEAD WITHOUT AND WITH CONTRAST TECHNIQUE: Multiplanar, multiecho pulse sequences of the brain and surrounding structures were obtained without and with intravenous contrast. CONTRAST:  10mL GADAVIST GADOBUTROL 1 MMOL/ML IV SOLN COMPARISON:  None. FINDINGS: Brain: Multiple ring-enhancing lesions present, consistent metastatic disease. A 7 mm lesion is present in the anterior inferior right cerebellum image 80 series 18. Bilateral cerebellar lesions are best seen on the susceptibility weighted images, image 13 of series 13 coronal image 12 series 19. A probable 3 mm  lesion is present in the anterior pons on image 53 of series 18. A 5.5 mm lesion is present in the posterior right temporal lobe/occipital lobe  image 81 series 18. A 4 mm lesion is present in the anterior right temporal lobe on image 65 series 18 A 6 mm cortical lesion is present in the posterior left parietal lobe image 16 of series 18. A 7.5 mm lesion is present slightly more superiorly in the left parietal lobe on image 120. A punctate left parietal lesion is present image 129. A 3.5 mm lesion is present in the posteromedial right parietal lobe image 29. A 3 mm lesion is present in the anterior left frontal lobe on image 30 Focal susceptibility is associated with several of the lesions compatible with remote hemorrhage. No acute hemorrhage is present. T2 signal changes are associated with several of the larger lesions as well. Moderate generalized atrophy and white matter disease is also present. The ventricles are proportionate to the degree of atrophy. No significant extraaxial fluid collection is present. The internal auditory canals are within normal limits. Vascular: Insert flow Skull and upper cervical spine: The craniocervical junction is normal. Upper cervical spine is within normal limits. Marrow signal is unremarkable. Sinuses/Orbits: Mild mucosal thickening is present throughout the anterior ethmoid air cells and right frontal sinus. No fluid levels are present. The paranasal sinuses and mastoid air cells are otherwise clear. Bilateral lens replacements are noted. Globes and orbits are otherwise unremarkable. IMPRESSION: 1. And least 11 ring-enhancing lesions consistent with metastatic disease to the brain. 2. Susceptibility associated with the majority of lesions is consistent with prior hemorrhage within the metastases. 3. Moderate generalized atrophy and white matter disease likely reflects the sequela of chronic microvascular ischemia. 4. Mild anterior ethmoid and right frontal sinus disease.  Electronically Signed   By: Marin Roberts M.D.   On: 03/18/2020 08:38   NM Cardiac Muga Rest  Result Date: 03/28/2020 CLINICAL DATA:  Chemotherapy patient. Assess left ventricular function. EXAM: NUCLEAR MEDICINE CARDIAC BLOOD POOL IMAGING (MUGA) TECHNIQUE: Cardiac multi-gated acquisition was performed at rest following intravenous injection of Tc-77m labeled red blood cells. RADIOPHARMACEUTICALS:  20.54 mCi Tc-80m pertechnetate in-vitro labeled red blood cells IV COMPARISON:  None. FINDINGS: Normal left ventricular wall motion is demonstrated. No akinetic or dyskinetic findings. Ejection fraction is estimated at 58.4%. IMPRESSION: Normal left ventricular wall motion. Estimated ejection fraction is 58.4%. Electronically Signed   By: Rudie Meyer M.D.   On: 03/28/2020 08:01    ASSESSMENT & PLAN:   Primary cancer of right upper lobe of lung (HCC) # Stage IV/recurrent adenocarcinoma of the lung/ mediastinal lymphadenopathy/liver/  pancreatic head/left adrenal gland/ muscle and skeletal metastasis/Subcutaneous nodule/Brain.  # NGS/F-One: B-RAF V600E; PDL-1-30%; recommend dabrafenib plus Mekinist. Discussed with pharmacy.  I again reviewed the potential side effects of TKI-therapy including but not limited to fevers skin rash diarrhea; risk of squamous cell cancers.  MUGA scan-58% ejection fraction.  Understand treatments are palliative; not curative.  Discussed with pharmacy.  We will plan to start on 04/12/2020-given previous infusion reaction to Zometa.  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  We will plan brain MRI in the next month or so.  # Bone lesions-mildly symptomatic; continue Zometa every 4 weeks [30 mins; tylenol].  Proceed with infusion today; premedicated with Tylenol.  #Discussed with patient/wife and daughter.   # DISPOSITION:  # follow up in 3 weeks; MD- labs- cbc/cmp-Dr.B   All questions were answered. The  patient knows to call the clinic with any problems, questions or concerns.    Earna Coder, MD 04/10/2020 7:28 AM

## 2020-04-13 ENCOUNTER — Other Ambulatory Visit: Payer: Self-pay

## 2020-04-13 ENCOUNTER — Ambulatory Visit
Admission: EM | Admit: 2020-04-13 | Discharge: 2020-04-13 | Disposition: A | Discharge status: Home / Self Care | Payer: PPO

## 2020-04-13 DIAGNOSIS — I8003 Phlebitis and thrombophlebitis of superficial vessels of lower extremities, bilateral: Secondary | ICD-10-CM | POA: Diagnosis not present

## 2020-04-13 NOTE — Discharge Instructions (Addendum)
Keep your legs elevated as much as possible.  Continue to wear your compression stockings.  Apply warm compresses to help resolve the phlebitis.  You can try some topical Voltaren gel to help with inflammation.  If you develop worsening redness, swelling, tenderness in her calves, chest pain, or shortness of breath need to go the ER for evaluation.

## 2020-04-13 NOTE — ED Provider Notes (Signed)
[facility]    CSN: [id number] Arrival date & time: 04/13/20  1117      History   Chief Complaint Chief Complaint  Patient presents with  . Leg tenderness    HPI Curtis Andrade. is a 75 y.o. male.   HPI   75 year old male here for evaluation of tenderness on the inside of both of his lower legs that started 2 days ago.  Patient reports that yesterday he developed redness over both sites.  Patient has significant deep and superficial varicosities of both lower extremities.  Patient denies any chest pain or shortness of breath.  Patient is currently being treated for metastatic lung cancer with mets to bone and brain.  He just completed radiation therapy on his entire brain and is currently taking chemo and immunotherapy as well as Ximed infusions.  Patient's most recent Ximed infusion was 4 days ago.  Patient typically reports that he gets severe bone pain when he gets his infusions.  Patient denies pain in his calfs or swelling to his cast.  Past Medical History:  Diagnosis Date  . 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 Colon polyp 2013  . Hemorrhoids    pt denies  . 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 Baruch Gouty and Dr. Nadeen Landau  . Phlebitis 1998  . Primary cancer of right upper lobe of lung ([place]) 01/14/2017   Dr. Genevive Bi performed a wedge resection of RUL.   . 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/27/2016    Patient Active Problem List   Diagnosis Date Noted  . Goals of care, counseling/discussion 03/20/2020  . Cancer, metastatic to bone ([place]) 03/04/2020  . Primary cancer of right upper lobe of lung ([place]) 02/04/2017  . Lung mass   . 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 Hemorrhoids 11/27/2016  . History of bilateral inguinal hernias 11/27/2016  . Laryngeal cancer ([place]) 11/27/2016  . Phlebitis 11/27/2016  . 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/27/2016  . History of  colonic polyps 11/26/2016    Past Surgical History:  Procedure Laterality Date  . COLONOSCOPY  2013   Dr Vira Agar  . COLONOSCOPY WITH PROPOFOL N/A 12/23/2016   Procedure: COLONOSCOPY WITH PROPOFOL;  Surgeon: Robert Bellow, MD;  Location: [facility];  Service: Endoscopy;  Laterality: N/A;  . EYE SURGERY    . HERNIA REPAIR Right 1990's  . TENDON REPAIR Left 03/14/2019   Procedure: TENDON REPAIR AND GRAFT  TRANSFER EXTENSOR INDICIS PROPRIUS LEFT THUMB;  Surgeon: [name], MD;  Location: [place];  Service: Orthopedics;  Laterality: Left;  AXILLARY BLOCK  . THORACOTOMY Right 01/14/2017   Procedure: RIGHT THORACOSCOPYWITH WIDE WEDGE RESECTION,  PREOP BROCHOSCOPY;  Surgeon: [name], MD;  Location: [facility] OR[facility];  Service: General;  Laterality: Right;  . THROAT SURGERY  2010       Home Medications    Prior to Admission medications   Medication Sig Start Date End Date Taking? Authorizing Provider  Ascorbic Acid (VITAMIN C) 1000 MG tablet Take 1,000 mg by mouth daily.    [provider]  aspirin EC 81 MG tablet Take 81 mg by mouth daily.    [provider]  dabrafenib mesylate (TAFINLAR) 75 MG capsule Take 2 capsules (150 mg total) by mouth 2 (two) times daily. Take on an empty stomach 1 hour before or 2 hours after meals. Patient not taking: Reported on 04/09/2020 03/20/20   Charlaine Dalton  R, MD  folic acid (FOLVITE) 878 MCG tablet Take 800 mcg by mouth daily.    [provider]  Glucosamine-Chondroitin (COSAMIN DS PO) Take 1 tablet by mouth daily.    [provider]  Multiple Vitamin (MULTIVITAMIN WITH MINERALS) TABS tablet Take 1 tablet by mouth daily. Senior Multivitamin.    [provider]  niacinamide 500 MG tablet Take 500 mg by mouth 2 (two) times daily with a meal.    [provider]  Omega-3 1000 MG CAPS Take 1 tablet by mouth 1 day or 1 dose.    [provider]  omeprazole (PRILOSEC) 40  MG capsule Take 40 mg by mouth daily before supper.  11/04/16   [provider]  trametinib dimethyl sulfoxide (MEKINIST) 2 MG tablet Take 1 tablet (2 mg total) by mouth daily. Take 1 hour before or 2 hours after a meal. Store refrigerated in original container. Patient not taking: Reported on 04/09/2020 03/20/20   Cammie Sickle, MD  vitamin E 400 UNIT capsule Take 1 capsule by mouth 1 day or 1 dose.    [provider]    Family History Family History  Problem Relation Age of Onset  . Alzheimer's disease Mother   . Alzheimer's disease Father   . Stroke Father   . Colon cancer Neg Hx     Social History Social History   Tobacco Use  . Smoking status: Former Smoker    Years: 50.00    Quit date: 03/31/2007    Years since quitting: 13.0  . Smokeless tobacco: Never Used  Vaping Use  . Vaping Use: Never used  Substance Use Topics  . Alcohol use: No  . Drug use: No     Allergies   Patient has no known allergies.   Review of Systems Review of Systems  Constitutional: Negative for activity change, appetite change and fever.  Musculoskeletal: Negative for arthralgias and joint swelling.  Skin: Positive for color change. Negative for rash.  Hematological: Negative.   Psychiatric/Behavioral: Negative.      Physical Exam Triage Vital Signs ED Triage Vitals [04/13/20 1215]  Enc Vitals Group     BP (!) 170/93     Pulse Rate 72     Resp 17     Temp 98.3 F (36.8 C)     Temp Source Oral     SpO2 99 %     Weight      Height      Head Circumference      Peak Flow      Pain Score 0     Pain Loc      Pain Edu?      Excl. in [place]?    No data found.  Updated Vital Signs BP (!) 170/93 (BP Location: Left Arm)   Pulse 72   Temp 98.3 F (36.8 C) (Oral)   Resp 17   SpO2 99%   Visual Acuity Right Eye Distance:   Left Eye Distance:   Bilateral Distance:    Right Eye Near:   Left Eye Near:    Bilateral Near:     Physical Exam Vitals and nursing  note reviewed.  Constitutional:      General: He is not in acute distress.    Appearance: Normal appearance. He is normal weight. He is not toxic-appearing.  HENT:     Head: Normocephalic and atraumatic.  Cardiovascular:     Rate and Rhythm: Normal rate and regular rhythm.  Pulses: Normal pulses.     Heart sounds: Normal heart sounds. No murmur heard. No gallop.   Pulmonary:     Effort: Pulmonary effort is normal.     Breath sounds: Normal breath sounds. No wheezing, rhonchi or rales.  Musculoskeletal:        General: Tenderness present. No swelling or deformity.     Right lower leg: No edema.     Left lower leg: No edema.  Skin:    General: Skin is warm and dry.     Capillary Refill: Capillary refill takes less than 2 seconds.     Findings: Erythema present.  Neurological:     General: No focal deficit present.     Mental Status: He is alert and oriented to person, place, and time.  Psychiatric:        Mood and Affect: Mood normal.        Behavior: Behavior normal.        Thought Content: Thought content normal.        Judgment: Judgment normal.      UC Treatments / Results  Labs (all labs ordered are listed, but only abnormal results are displayed) Labs Reviewed - No data to display  EKG   Radiology No results found.  Procedures Procedures (including critical care time)  Medications Ordered in UC Medications - No data to display  Initial Impression / Assessment and Plan / UC Course  I have reviewed the triage vital signs and the nursing notes.  Pertinent labs & imaging results that were available during my care of the patient were reviewed by me and considered in my medical decision making (see chart for details).   Patient is a very pleasant 75 year old male who is here for evaluation of redness and tenderness to the insides of both lower legs.  The redness is not indurated or inflamed and overlies multiple superficial varicosities.  Patient's DP and PT  pulses bilaterally are 2+.  Patient's right calf is 38 cm and his left calf is 39 cm.  Patient does not have a positive Homans' sign.  The areas do not look cellulitic they look like superficial thrombophlebitis.  Patient cannot take NSAIDs due to his current cancer treatment and the fact that he was just recently on prednisone for brain swelling secondary to radiation therapy.  Patient does wear compression stockings daily.  Patient's exam is consistent with superficial thrombophlebitis.  We will treat patient with compression, elevation, and warm compresses.  Patient advised that if he develops pain in his calves, increased swelling, chest pain, or shortness of breath he is to go to the ER for evaluation.   Final Clinical Impressions(s) / UC Diagnoses   Final diagnoses:  Thrombophlebitis of superficial veins of both lower extremities     Discharge Instructions     Keep your legs elevated as much as possible.  Continue to wear your compression stockings.  Apply warm compresses to help resolve the phlebitis.  You can try some topical Voltaren gel to help with inflammation.  If you develop worsening redness, swelling, tenderness in her calves, chest pain, or shortness of breath need to go the ER for evaluation.    ED Prescriptions    None     PDMP not reviewed this encounter.   Margarette Canada, NP 04/13/20 1258

## 2020-04-13 NOTE — ED Triage Notes (Signed)
Pt is here with bilateral leg tenderness that started Thursday, pt has taken Tylenol to relieve discomfort.

## 2020-04-15 ENCOUNTER — Telehealth: Payer: Self-pay | Admitting: *Deleted

## 2020-04-15 NOTE — Telephone Encounter (Signed)
Contacted patient to follow-up on patient's call. Patient was evaluated in Urgent Care this weekend. He was dx with superficial thrombrophlebitis. He is taking his asa, using compression stockings and warm compresses and Voltaren gel to help with inflammation. He symptoms in both legs are currently improving. The phlebitis was located on the right and Left inside [place] area. The right was worse than the left. He denies any shortness of breath, chest pain, redness or tenderness in the legs today. In fact, he is eager to get on his stationary bike inside the home and exercise today.  Dr. Clement Sayres- Do we need to do anything else since symptoms. His next apts are on 04/30/20

## 2020-04-15 NOTE — Telephone Encounter (Signed)
-----   Message from Earlie Server, MD sent at 04/15/2020 12:49 PM EST ----- He called during weekend reporting tender and redness of his lower extremity veins, he wears compression stocking. He feels he may have blood clots. I ask him to go to urgent care to check it out. Recent brain RT, so probably no anticoagulation if he does have DVT. Please follow up on him .

## 2020-04-16 ENCOUNTER — Telehealth: Payer: Self-pay | Admitting: Internal Medicine

## 2020-04-16 NOTE — Telephone Encounter (Signed)
On 1/17-called patient regarding his concerns of superficial venous thrombosis in his legs.  Patient has previous history of SVT.  Evaluated at the urgent care clinic.;  Did not have any ultrasound Dopplers done.  Denies any significant leg swelling.  Using topical NSAIDs ice/compression stockings-symptoms improving.  Patient continues to be on his oral medication for his cancer.  Patient recommend call us if his symptoms are getting worse.

## 2020-04-18 NOTE — Telephone Encounter (Signed)
Agree with their recommendations. Only other thing I could add is to refer to Vascular if he'd like? Thanks!

## 2020-04-18 NOTE — Telephone Encounter (Signed)
Lauren/Heather--since this is superficial thrombophlebitis it is okay HOLD off vascular evaluation at this time. He can get back to his regular routine activities.  Follow-up as planned. Thanks GB

## 2020-04-24 DIAGNOSIS — L821 Other seborrheic keratosis: Secondary | ICD-10-CM | POA: Diagnosis not present

## 2020-04-24 DIAGNOSIS — Z85828 Personal history of other malignant neoplasm of skin: Secondary | ICD-10-CM | POA: Diagnosis not present

## 2020-04-24 DIAGNOSIS — X32XXXA Exposure to sunlight, initial encounter: Secondary | ICD-10-CM | POA: Diagnosis not present

## 2020-04-24 DIAGNOSIS — D2262 Melanocytic nevi of left upper limb, including shoulder: Secondary | ICD-10-CM | POA: Diagnosis not present

## 2020-04-24 DIAGNOSIS — L820 Inflamed seborrheic keratosis: Secondary | ICD-10-CM | POA: Diagnosis not present

## 2020-04-24 DIAGNOSIS — D485 Neoplasm of uncertain behavior of skin: Secondary | ICD-10-CM | POA: Diagnosis not present

## 2020-04-24 DIAGNOSIS — L570 Actinic keratosis: Secondary | ICD-10-CM | POA: Diagnosis not present

## 2020-04-24 DIAGNOSIS — D2261 Melanocytic nevi of right upper limb, including shoulder: Secondary | ICD-10-CM | POA: Diagnosis not present

## 2020-04-24 DIAGNOSIS — D225 Melanocytic nevi of trunk: Secondary | ICD-10-CM | POA: Diagnosis not present

## 2020-04-26 ENCOUNTER — Other Ambulatory Visit: Payer: Self-pay

## 2020-04-26 DIAGNOSIS — C3411 Malignant neoplasm of upper lobe, right bronchus or lung: Secondary | ICD-10-CM

## 2020-04-30 ENCOUNTER — Inpatient Hospital Stay: Payer: PPO | Attending: Internal Medicine | Admitting: Internal Medicine

## 2020-04-30 ENCOUNTER — Other Ambulatory Visit: Payer: Self-pay

## 2020-04-30 ENCOUNTER — Inpatient Hospital Stay: Payer: PPO

## 2020-04-30 DIAGNOSIS — C3411 Malignant neoplasm of upper lobe, right bronchus or lung: Secondary | ICD-10-CM

## 2020-04-30 DIAGNOSIS — Z79899 Other long term (current) drug therapy: Secondary | ICD-10-CM | POA: Insufficient documentation

## 2020-04-30 DIAGNOSIS — Z8521 Personal history of malignant neoplasm of larynx: Secondary | ICD-10-CM | POA: Diagnosis not present

## 2020-04-30 DIAGNOSIS — Z9221 Personal history of antineoplastic chemotherapy: Secondary | ICD-10-CM | POA: Insufficient documentation

## 2020-04-30 DIAGNOSIS — Z923 Personal history of irradiation: Secondary | ICD-10-CM | POA: Diagnosis not present

## 2020-04-30 DIAGNOSIS — C7931 Secondary malignant neoplasm of brain: Secondary | ICD-10-CM | POA: Insufficient documentation

## 2020-04-30 DIAGNOSIS — Z7982 Long term (current) use of aspirin: Secondary | ICD-10-CM | POA: Insufficient documentation

## 2020-04-30 DIAGNOSIS — C787 Secondary malignant neoplasm of liver and intrahepatic bile duct: Secondary | ICD-10-CM | POA: Diagnosis not present

## 2020-04-30 DIAGNOSIS — Z87891 Personal history of nicotine dependence: Secondary | ICD-10-CM | POA: Insufficient documentation

## 2020-04-30 DIAGNOSIS — C7951 Secondary malignant neoplasm of bone: Secondary | ICD-10-CM | POA: Insufficient documentation

## 2020-04-30 LAB — COMPREHENSIVE METABOLIC PANEL
ALT: 22 U/L (ref 0–44)
AST: 35 U/L (ref 15–41)
Albumin: 4 g/dL (ref 3.5–5.0)
Alkaline Phosphatase: 122 U/L (ref 38–126)
Anion gap: 9 (ref 5–15)
BUN: 18 mg/dL (ref 8–23)
CO2: 28 mmol/L (ref 22–32)
Calcium: 9.3 mg/dL (ref 8.9–10.3)
Chloride: 97 mmol/L — ABNORMAL LOW (ref 98–111)
Creatinine, Ser: 1.06 mg/dL (ref 0.61–1.24)
GFR, Estimated: >60 mL/min (ref >60)
Glucose, Bld: 123 mg/dL — ABNORMAL HIGH (ref 70–99)
Potassium: 5 mmol/L (ref 3.5–5.1)
Sodium: 134 mmol/L — ABNORMAL LOW (ref 135–145)
Total Bilirubin: 0.8 mg/dL (ref 0.3–1.2)
Total Protein: 7.4 g/dL (ref 6.5–8.1)

## 2020-04-30 LAB — CBC
HCT: 45.2 % (ref 39.0–52.0)
Hemoglobin: 15 g/dL (ref 13.0–17.0)
MCH: 30.5 pg (ref 26.0–34.0)
MCHC: 33.2 g/dL (ref 30.0–36.0)
MCV: 91.9 fL (ref 80.0–100.0)
Platelets: 194 10*3/uL (ref 150–400)
RBC: 4.92 MIL/uL (ref 4.22–5.81)
RDW: 15.5 % (ref 11.5–15.5)
WBC: 6.4 10*3/uL (ref 4.0–10.5)
nRBC: 0 % (ref 0.0–0.2)

## 2020-04-30 NOTE — Progress Notes (Signed)
[facility] NOTE  Patient Care Team: [name], MD as PCP - General (Family Medicine) [name], MD as Consulting Physician (Family Medicine) [name], [name], MD (General Surgery) Thornton Park, DO as Consulting Physician (Radiation Oncology)  CHIEF COMPLAINTS/PURPOSE OF CONSULTATION: Lung cancer  #  Oncology History Overview Note  # OCT 2018- STAGE I- A. 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  - PARENCHYMAL MARGIN APPEARS CLEAR; TUMOR WAS 1 CM FROM MARGIN BEFORE REMOVAL OF STAPLE LINE.; NO adjuvant therapy.   # DEC 2021-- METASTATIC CARCINOMA, COMPATIBLE WITH PULMONARY ADENOCARCINOMA [left shoulder Core Biopsy];  NOV 24th PET-right hilar and Hypermetabolic mediastinal lymphadenopathy; small pulmonary nodules; 2 metastatic lesions in the liver/  pancreatic head/left adrenal gland;  muscle and skeletal metastasis.  Subcutaneous nodule-positive for metastatic adeno lung.   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  Short course of Eliquis.smoking-quit 2008.  ---------------------------------------    DIAGNOSIS: LUNG CA  STAGE:  IV      ;GOALS:palliation  CURRENT/MOST RECENT THERAPY: DAB+TREM    Primary cancer of right upper lobe of lung ([place])  04/09/2020 Cancer Staging   Staging form: Lung, AJCC 8th Edition - Clinical: Stage IVB (cN1, cM1c) - Signed by Cammie Sickle, MD on 04/09/2020      HISTORY OF PRESENTING ILLNESS:  Curtis Andrade. 75 y.o.  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  Patient complains of dry skin.  Otherwise no skin rash.  States his back pain/small lump on his back is improved.  Patient's joint pains muscle pain post Zometa improved with Tylenol.  Complains of fatigue.  Also complains of memory problems especially short-term.  Review of Systems  Constitutional: Positive for malaise/fatigue. Negative for chills, diaphoresis, fever and weight loss.  HENT: Negative for nosebleeds and sore throat.   Eyes: Negative for double vision.  Respiratory: Negative for cough, hemoptysis, sputum production, shortness of breath and wheezing.   Cardiovascular: Negative for chest pain, palpitations, orthopnea and leg swelling.  Gastrointestinal: Negative for abdominal pain, blood in stool, constipation, diarrhea, heartburn, melena, nausea and vomiting.  Genitourinary: Negative for dysuria, frequency and urgency.  Musculoskeletal: Positive for back pain and joint pain.  Skin: Negative.  Negative for itching and rash.  Neurological: Positive for weakness. Negative for dizziness, tingling, focal weakness and headaches.  Endo/Heme/Allergies: Does not bruise/bleed easily.  Psychiatric/Behavioral: Positive for memory loss. Negative for depression. The patient is not nervous/anxious and does not have insomnia.      MEDICAL HISTORY:  Past Medical History:  Diagnosis Date  . 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 Colon polyp 2013  . Hemorrhoids    pt denies  . 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 Baruch Gouty and Dr. Nadeen Landau  . Phlebitis 1998  . Primary cancer of right upper lobe of lung ([place]) 01/14/2017   Dr. Genevive Bi performed a wedge resection of RUL.   . 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/27/2016  SURGICAL HISTORY: Past Surgical History:  Procedure Laterality Date   . COLONOSCOPY  2013   Dr Vira Agar  . COLONOSCOPY WITH PROPOFOL N/A 12/23/2016   Procedure: COLONOSCOPY WITH PROPOFOL;  Surgeon: Robert Bellow, MD;  Location: [facility];  Service: Endoscopy;  Laterality: N/A;  . EYE SURGERY    . HERNIA REPAIR Right 1990's  . TENDON REPAIR Left 03/14/2019   Procedure: TENDON REPAIR AND GRAFT  TRANSFER EXTENSOR INDICIS PROPRIUS LEFT THUMB;  Surgeon: [name], MD;  Location: [facility];  Service: Orthopedics;  Laterality: Left;  AXILLARY BLOCK  . THORACOTOMY Right 01/14/2017   Procedure: RIGHT THORACOSCOPYWITH WIDE WEDGE RESECTION,  PREOP BROCHOSCOPY;  Surgeon: [name], MD;  Location: [facility] ORS;  Service: General;  Laterality: Right;  . THROAT SURGERY  2010    SOCIAL HISTORY: Social History   Socioeconomic History  . Marital status: Married    Spouse name: Not on file  . Number of children: Not on file  . Years of education: Not on file  . Highest education level: Not on file  Occupational History  . Not on file  Tobacco Use  . Smoking status: Former Smoker    Years: 50.00    Quit date: 03/31/2007    Years since quitting: 13.0  . Smokeless tobacco: Never Used  Vaping Use  . Vaping Use: Never used  Substance and Sexual Activity  . Alcohol use: No  . Drug use: No  . Sexual activity: Yes    Birth control/protection: None    Comment: Married  Other Topics Concern  . Not on file  Social History Narrative  . Not on file   Social Determinants of Health   Financial Resource Strain: Not on file  Food Insecurity: Not on file  Transportation Needs: Not on file  Physical Activity: Not on file  Stress: Not on file  Social Connections: Not on file  Intimate Partner Violence: Not on file    FAMILY HISTORY: Family History  Problem Relation Age of Onset  . Alzheimer's disease Mother   . Alzheimer's disease Father   . Stroke Father   . Colon cancer Neg Hx     ALLERGIES:  has No Known Allergies.  MEDICATIONS:   Current Outpatient Medications  Medication Sig Dispense Refill  . Ascorbic Acid (VITAMIN C) 1000 MG tablet Take 1,000 mg by mouth daily.    Marland Kitchen aspirin EC 81 MG tablet Take 81 mg by mouth daily.    . Calcium Carbonate-Vit D-Min (CALCIUM 1200 PO) Take 1 tablet by mouth daily.    Marland Kitchen dabrafenib mesylate (TAFINLAR) 75 MG capsule Take 2 capsules (150 mg total) by mouth 2 (two) times daily. Take on an empty stomach 1 hour before or 2 hours after meals. 101 capsule 3  . folic acid (FOLVITE) 751 MCG tablet Take 800 mcg by mouth daily.    . Glucosamine-Chondroitin (COSAMIN DS PO) Take 1 tablet by mouth daily.    . Multiple Vitamin (MULTIVITAMIN WITH MINERALS) TABS tablet Take 1 tablet by mouth daily. Senior Multivitamin.    . niacinamide 500 MG tablet Take 500 mg by mouth 2 (two) times daily with a meal.    . Omega-3 1000 MG CAPS Take 1 tablet by mouth 1 day or 1 dose.    Marland Kitchen omeprazole (PRILOSEC) 40 MG capsule Take 40 mg by mouth daily before supper.     . trametinib dimethyl sulfoxide (MEKINIST) 2 MG tablet Take 1 tablet (2 mg total) by mouth daily. Take  1 hour before or 2 hours after a meal. Store refrigerated in original container. 30 tablet 3   No current facility-administered medications for this visit.      Marland Kitchen  PHYSICAL EXAMINATION: ECOG PERFORMANCE STATUS: 0 - Asymptomatic  Vitals:   04/30/20 0952  BP: (!) 153/76  Pulse: 87  Resp: 18  Temp: 98.4 F (36.9 C)  SpO2: 100%   Filed Weights   04/30/20 0952  Weight: 205 lb (93 kg)    Physical Exam Constitutional:      Comments: Ambulating by independently.  Accompanied by his wife.  One of his daughter over the phone/FaceTime.  HENT:     Head: Normocephalic and atraumatic.     Mouth/Throat:     Pharynx: No oropharyngeal exudate.  Eyes:     Pupils: Pupils are equal, round, and reactive to light.  Cardiovascular:     Rate and Rhythm: Normal rate and regular rhythm.  Pulmonary:     Effort: Pulmonary effort is normal. No respiratory  distress.     Breath sounds: Normal breath sounds. No wheezing.  Abdominal:     General: Bowel sounds are normal. There is no distension.     Palpations: Abdomen is soft. There is no mass.     Tenderness: There is no abdominal tenderness. There is no guarding or rebound.  Musculoskeletal:        General: No tenderness. Normal range of motion.     Cervical back: Normal range of motion and neck supple.  Skin:    General: Skin is warm.  Neurological:     Mental Status: He is alert and oriented to person, place, and time.  Psychiatric:        Mood and Affect: Affect normal.   ;  LABORATORY DATA:  I have reviewed the data as listed Lab Results  Component Value Date   WBC 6.4 04/30/2020   HGB 15.0 04/30/2020   HCT 45.2 04/30/2020   MCV 91.9 04/30/2020   PLT 194 04/30/2020   Recent Labs    02/12/20 0941 04/09/20 1436 04/30/20 0937  NA 135 137 134*  K 4.6 4.3 5.0  CL 101 102 97*  CO2 27 29 28   GLUCOSE 104* 89 123*  BUN 15 23 18   CREATININE 0.95 1.12 1.06  CALCIUM 8.9 8.9 9.3  GFRNONAA >60 >60 >60  PROT 7.7 7.4 7.4  ALBUMIN 4.0 3.8 4.0  AST 28 21 35  ALT 24 21 22   ALKPHOS 51 66 122  BILITOT 0.7 0.3 0.8    RADIOGRAPHIC STUDIES: I have personally reviewed the radiological images as listed and agreed with the findings in the report. No results found.  ASSESSMENT & PLAN:   Primary cancer of right upper lobe of lung ([place]) # Stage IV/recurrent adenocarcinoma of the lung/ mediastinal lymphadenopathy/liver/  pancreatic head/left adrenal gland/ muscle and skeletal metastasis/Subcutaneous nodule/Brain- on B-RAF V600E- on dabrafenib plus Mekinist.   # Tolerating well Dab+Mek with mild to moderate side effects; clinical benefit noted. Will plan PET scan in 3 months- mid March 2022.   # Fatigue-secondary malignancy/radiation- recommend continued exercise.   #Memory loss short-term-likely acute radiation side effect.  Hopefully to improve.  Monitor for now  # Brain  metastases: repeat MRI in early March 2022  # Bone lesions-mildly symptomatic; continue Zometa every 4 weeks [30 mins; tylenol].  Proceed with infusion premedicated with Tylenol at next visit.   # Discussed with patient/wife and daughter.   # DISPOSITION:  # follow up in 3  weeks; MD- labs- cbc/cmp;TSH;  Zometa-Dr.B   All questions were answered. The patient knows to call the clinic with any problems, questions or concerns.    Cammie Sickle, MD 04/30/2020 4:27 PM

## 2020-04-30 NOTE — Progress Notes (Signed)
Patient here for followup. Patient c/o brittle nail; fatigue, increase urination at bedtime.

## 2020-04-30 NOTE — Assessment & Plan Note (Addendum)
#   Stage IV/recurrent adenocarcinoma of the lung/ mediastinal lymphadenopathy/liver/  pancreatic head/left adrenal gland/ muscle and skeletal metastasis/Subcutaneous nodule/Brain- on B-RAF V600E- on dabrafenib plus Mekinist.   # Tolerating well Dab+Mek with mild to moderate side effects; clinical benefit noted. Will plan PET scan in 3 months- mid March 2022.   # Fatigue-secondary malignancy/radiation- recommend continued exercise.   #Memory loss short-term-likely acute radiation side effect.  Hopefully to improve.  Monitor for now  # Brain metastases: repeat MRI in early March 2022  # Bone lesions-mildly symptomatic; continue Zometa every 4 weeks [30 mins; tylenol].  Proceed with infusion premedicated with Tylenol at next visit.   # Discussed with patient/wife and daughter.   # DISPOSITION:  # follow up in 3 weeks; MD- labs- cbc/cmp;TSH;  Zometa-Dr.B

## 2020-05-10 ENCOUNTER — Encounter: Payer: Self-pay | Admitting: Radiation Oncology

## 2020-05-10 ENCOUNTER — Other Ambulatory Visit: Payer: Self-pay

## 2020-05-10 ENCOUNTER — Ambulatory Visit
Admission: RE | Admit: 2020-05-10 | Discharge: 2020-05-10 | Disposition: A | Discharge status: Home / Self Care | Payer: PPO | Source: Ambulatory Visit | Attending: Radiation Oncology | Admitting: Radiation Oncology

## 2020-05-10 VITALS — BP 155/84 | HR 88 | Temp 97.5°F | Wt 205.0 lb

## 2020-05-10 DIAGNOSIS — C3490 Malignant neoplasm of unspecified part of unspecified bronchus or lung: Secondary | ICD-10-CM

## 2020-05-10 NOTE — Progress Notes (Signed)
Radiation Oncology Follow up Note  Name: Curtis Andrade.   Date:   05/10/2020 MRN:  [id number] DOB: 1945/10/10    This 75 y.o. male presents to the clinic today for 1 month follow-up status post whole brain radiation for significant metastatic involvement in patient with known stage IV recurrent adenocarcinoma of the lung.Marland Kitchen  REFERRING PROVIDER: [name], MD  HPI: Patient is a 75 year old male now at 1 month having completed whole brain radiation therapy.  He has known stage IV recurrent adenocarcinoma of the lung with mediastinal skeletal metastasis.  He is currently on. Dab+Mek and tolerating that well.  He has no focal neurologic deficits no headaches has been tapered off of his steroids.  He does have some apraxia with his short course of interval between radiation whole brain this is most likely related to Broca involvement of his metastatic disease rather than cognitive decline. COMPLICATIONS OF TREATMENT: none  FOLLOW UP COMPLIANCE: keeps appointments   PHYSICAL EXAM:  BP (!) 155/84   Pulse 88   Temp (!) 97.5 F (36.4 C) (Tympanic)   Wt 205 lb (93 kg)   BMI 28.59 kg/m  Crude visual fields within normal range motor sensory and DTR levels are equal and symmetric in upper lower extremities.  Well-developed well-nourished patient in NAD. HEENT reveals PERLA, EOMI, discs not visualized.  Oral cavity is clear. No oral mucosal lesions are identified. Neck is clear without evidence of cervical or supraclavicular adenopathy. Lungs are clear to A&P. Cardiac examination is essentially unremarkable with regular rate and rhythm without murmur rub or thrill. Abdomen is benign with no organomegaly or masses noted. Motor sensory and DTR levels are equal and symmetric in the upper and lower extremities. Cranial nerves II through XII are grossly intact. Proprioception is intact. No peripheral adenopathy or edema is identified. No motor or sensory levels are noted. Crude visual fields are within  normal range.  RADIOLOGY RESULTS: No current films to review  PLAN: Present time patient is undergoing immunotherapy treatments by medical oncology.  He is from neurologic standpoint stable.  I will turn follow-up care over to medical oncology.  We will be happy to reevaluate patient anytime should further palliative treatment be indicated.  I would like to take this opportunity to thank you for allowing me to participate in the care of your patient.Noreene Filbert, MD

## 2020-05-13 DIAGNOSIS — I872 Venous insufficiency (chronic) (peripheral): Secondary | ICD-10-CM | POA: Diagnosis not present

## 2020-05-13 DIAGNOSIS — M216X2 Other acquired deformities of left foot: Secondary | ICD-10-CM | POA: Diagnosis not present

## 2020-05-13 DIAGNOSIS — M7731 Calcaneal spur, right foot: Secondary | ICD-10-CM | POA: Diagnosis not present

## 2020-05-13 DIAGNOSIS — M79671 Pain in right foot: Secondary | ICD-10-CM | POA: Diagnosis not present

## 2020-05-13 DIAGNOSIS — M722 Plantar fascial fibromatosis: Secondary | ICD-10-CM | POA: Diagnosis not present

## 2020-05-13 DIAGNOSIS — M216X1 Other acquired deformities of right foot: Secondary | ICD-10-CM | POA: Diagnosis not present

## 2020-05-21 ENCOUNTER — Other Ambulatory Visit: Payer: Self-pay

## 2020-05-21 DIAGNOSIS — C7931 Secondary malignant neoplasm of brain: Secondary | ICD-10-CM

## 2020-05-21 DIAGNOSIS — C7951 Secondary malignant neoplasm of bone: Secondary | ICD-10-CM

## 2020-05-21 DIAGNOSIS — C3411 Malignant neoplasm of upper lobe, right bronchus or lung: Secondary | ICD-10-CM

## 2020-05-22 ENCOUNTER — Encounter: Payer: Self-pay | Admitting: Internal Medicine

## 2020-05-22 ENCOUNTER — Other Ambulatory Visit: Payer: Self-pay

## 2020-05-22 ENCOUNTER — Inpatient Hospital Stay: Payer: PPO

## 2020-05-22 ENCOUNTER — Inpatient Hospital Stay: Payer: PPO | Admitting: Internal Medicine

## 2020-05-22 VITALS — BP 156/59 | HR 64 | Temp 96.9°F | Resp 16 | Ht 71.0 in | Wt 205.0 lb

## 2020-05-22 DIAGNOSIS — C7951 Secondary malignant neoplasm of bone: Secondary | ICD-10-CM

## 2020-05-22 DIAGNOSIS — C3411 Malignant neoplasm of upper lobe, right bronchus or lung: Secondary | ICD-10-CM

## 2020-05-22 DIAGNOSIS — C7931 Secondary malignant neoplasm of brain: Secondary | ICD-10-CM

## 2020-05-22 LAB — CBC WITH DIFFERENTIAL/PLATELET
Abs Immature Granulocytes: 0.02 10*3/uL (ref 0.00–0.07)
Basophils Absolute: 0 10*3/uL (ref 0.0–0.1)
Basophils Relative: 1 %
Eosinophils Absolute: 0.1 10*3/uL (ref 0.0–0.5)
Eosinophils Relative: 2 %
HCT: 44.7 % (ref 39.0–52.0)
Hemoglobin: 14.4 g/dL (ref 13.0–17.0)
Immature Granulocytes: 0 %
Lymphocytes Relative: 31 %
Lymphs Abs: 1.7 10*3/uL (ref 0.7–4.0)
MCH: 30.5 pg (ref 26.0–34.0)
MCHC: 32.2 g/dL (ref 30.0–36.0)
MCV: 94.7 fL (ref 80.0–100.0)
Monocytes Absolute: 0.5 10*3/uL (ref 0.1–1.0)
Monocytes Relative: 9 %
Neutro Abs: 3 10*3/uL (ref 1.7–7.7)
Neutrophils Relative %: 57 %
Platelets: 182 10*3/uL (ref 150–400)
RBC: 4.72 MIL/uL (ref 4.22–5.81)
RDW: 18 % — ABNORMAL HIGH (ref 11.5–15.5)
WBC: 5.3 10*3/uL (ref 4.0–10.5)
nRBC: 0 % (ref 0.0–0.2)

## 2020-05-22 LAB — COMPREHENSIVE METABOLIC PANEL
ALT: 24 U/L (ref 0–44)
AST: 31 U/L (ref 15–41)
Albumin: 3.7 g/dL (ref 3.5–5.0)
Alkaline Phosphatase: 71 U/L (ref 38–126)
Anion gap: 11 (ref 5–15)
BUN: 19 mg/dL (ref 8–23)
CO2: 26 mmol/L (ref 22–32)
Calcium: 8.7 mg/dL — ABNORMAL LOW (ref 8.9–10.3)
Chloride: 99 mmol/L (ref 98–111)
Creatinine, Ser: 1.01 mg/dL (ref 0.61–1.24)
GFR, Estimated: >60 mL/min (ref >60)
Glucose, Bld: 111 mg/dL — ABNORMAL HIGH (ref 70–99)
Potassium: 4.2 mmol/L (ref 3.5–5.1)
Sodium: 136 mmol/L (ref 135–145)
Total Bilirubin: 0.8 mg/dL (ref 0.3–1.2)
Total Protein: 6.9 g/dL (ref 6.5–8.1)

## 2020-05-22 LAB — TSH: TSH: 1.606 u[IU]/mL (ref 0.350–4.500)

## 2020-05-22 MED ORDER — SODIUM CHLORIDE 0.9 % IV SOLN
Freq: Once | INTRAVENOUS | Status: AC
  Filled 2020-05-22: qty 250

## 2020-05-22 MED ORDER — ZOLEDRONIC ACID 4 MG/100ML IV SOLN
4.0000 mg | Freq: Once | INTRAVENOUS | Status: AC
  Administered 2020-05-22: 4 mg via INTRAVENOUS
  Filled 2020-05-22: qty 100

## 2020-05-22 NOTE — Progress Notes (Signed)
[place] NOTE  Patient Care Team: [name], MD as PCP - General (Family Medicine) [name], MD as Consulting Physician (Family Medicine) [name], [name], MD (General Surgery) Thornton Park, DO as Consulting Physician (Radiation Oncology)  CHIEF COMPLAINTS/PURPOSE OF CONSULTATION: Lung cancer  #  Oncology History Overview Note  # OCT 2018- STAGE I- A. 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  - PARENCHYMAL MARGIN APPEARS CLEAR; TUMOR WAS 1 CM FROM MARGIN BEFORE REMOVAL OF STAPLE LINE.; NO adjuvant therapy.   # DEC 2021-- METASTATIC CARCINOMA, COMPATIBLE WITH PULMONARY ADENOCARCINOMA [left shoulder Core Biopsy];  NOV 24th PET-right hilar and Hypermetabolic mediastinal lymphadenopathy; small pulmonary nodules; 2 metastatic lesions in the liver/  pancreatic head/left adrenal gland;  muscle and skeletal metastasis.  Subcutaneous nodule-positive for metastatic adeno lung.   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  Short course of Eliquis.smoking-quit 2008.  ---------------------------------------    DIAGNOSIS: LUNG CA  STAGE:  IV      ;GOALS:palliation  CURRENT/MOST RECENT THERAPY: DAB+TREM    Primary cancer of right upper lobe of lung ([place])  04/09/2020 Cancer Staging   Staging form: Lung, AJCC 8th Edition - Clinical: Stage IVB (cN1, cM1c) - Signed by Cammie Sickle, MD on 04/09/2020      HISTORY OF PRESENTING ILLNESS:  Curtis Andrade. 75 y.o.  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  Denies any fevers or chills.  Denies any worsening skin rash.  No new lumps or bumps.  Joint pains muscle pain-post Zometa infusion improved after taking Tylenol.  Continues to mild to moderate fatigue.   Patient is interested to go back to driving unable to go to [facility].   Review of Systems  Constitutional: Positive for malaise/fatigue. Negative for chills, diaphoresis, fever and weight loss.  HENT: Negative for nosebleeds and sore throat.   Eyes: Negative for double vision.  Respiratory: Negative for cough, hemoptysis, sputum production, shortness of breath and wheezing.   Cardiovascular: Negative for chest pain, palpitations, orthopnea and leg swelling.  Gastrointestinal: Negative for abdominal pain, blood in stool, constipation, diarrhea, heartburn, melena, nausea and vomiting.  Genitourinary: Negative for dysuria, frequency and urgency.  Musculoskeletal: Positive for back pain and joint pain.  Skin: Negative.  Negative for itching and rash.  Neurological: Positive for weakness. Negative for dizziness, tingling, focal weakness and headaches.  Endo/Heme/Allergies: Does not bruise/bleed easily.  Psychiatric/Behavioral: Positive for memory loss. Negative for depression. The patient is not nervous/anxious and does not have insomnia.      MEDICAL HISTORY:  Past Medical History:  Diagnosis Date  . 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 Colon polyp 2013  . Hemorrhoids    pt denies  . 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 Baruch Gouty and Dr. Nadeen Landau  . Phlebitis 1998  . Primary cancer of right upper lobe of lung ([place]) 01/14/2017   Dr. Genevive Bi performed a wedge resection of RUL.   Curtis Andrade  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/27/2016    SURGICAL HISTORY: Past Surgical History:  Procedure Laterality  Date  . COLONOSCOPY  2013   Dr Vira Agar  . COLONOSCOPY WITH PROPOFOL N/A 12/23/2016   Procedure: COLONOSCOPY WITH PROPOFOL;  Surgeon: Robert Bellow, MD;  Location: [facility] END[facility];  Service: Endoscopy;  Laterality: N/A;  . EYE SURGERY    . HERNIA REPAIR Right 1990's  . TENDON REPAIR Left 03/14/2019   Procedure: TENDON REPAIR AND GRAFT  TRANSFER EXTENSOR INDICIS PROPRIUS LEFT THUMB;  Surgeon: [name], MD;  Location: [facility];  Service: Orthopedics;  Laterality: Left;  AXILLARY BLOCK  . THORACOTOMY Right 01/14/2017   Procedure: RIGHT THORACOSCOPYWITH WIDE WEDGE RESECTION,  PREOP BROCHOSCOPY;  Surgeon: [name], MD;  Location: [facility] ORS;  Service: General;  Laterality: Right;  . THROAT SURGERY  2010    SOCIAL HISTORY: Social History   Socioeconomic History  . Marital status: Married    Spouse name: Not on file  . Number of children: Not on file  . Years of education: Not on file  . Highest education level: Not on file  Occupational History  . Not on file  Tobacco Use  . Smoking status: Former Smoker    Years: 50.00    Quit date: 03/31/2007    Years since quitting: 13.1  . Smokeless tobacco: Never Used  Vaping Use  . Vaping Use: Never used  Substance and Sexual Activity  . Alcohol use: No  . Drug use: No  . Sexual activity: Yes    Birth control/protection: None    Comment: Married  Other Topics Concern  . Not on file  Social History Narrative  . Not on file   Social Determinants of Health   Financial Resource Strain: Not on file  Food Insecurity: Not on file  Transportation Needs: Not on file  Physical Activity: Not on file  Stress: Not on file  Social Connections: Not on file  Intimate Partner Violence: Not on file    FAMILY HISTORY: Family History  Problem Relation Age of Onset  . Alzheimer's disease Mother   . Alzheimer's disease Father   . Stroke Father   . Colon cancer Neg Hx     ALLERGIES:  has No Known  Allergies.  MEDICATIONS:  Current Outpatient Medications  Medication Sig Dispense Refill  . Ascorbic Acid (VITAMIN C) 1000 MG tablet Take 1,000 mg by mouth daily.    Curtis Andrade aspirin EC 81 MG tablet Take 81 mg by mouth daily.    . Calcium Carbonate-Vit D-Min (CALCIUM 1200 PO) Take 1 tablet by mouth daily.    Curtis Andrade dabrafenib mesylate (TAFINLAR) 75 MG capsule Take 2 capsules (150 mg total) by mouth 2 (two) times daily. Take on an empty stomach 1 hour before or 2 hours after meals. 782 capsule 3  . folic acid (FOLVITE) 956 MCG tablet Take 800 mcg by mouth daily.    . Glucosamine-Chondroitin (COSAMIN DS PO) Take 1 tablet by mouth daily.    . Multiple Vitamin (MULTIVITAMIN WITH MINERALS) TABS tablet Take 1 tablet by mouth daily. Senior Multivitamin.    . niacinamide 500 MG tablet Take 500 mg by mouth 2 (two) times daily with a meal.    . Omega-3 1000 MG CAPS Take 1 tablet by mouth 1 day or 1 dose.    Curtis Andrade omeprazole (PRILOSEC) 40 MG capsule Take 40 mg by mouth daily before supper.     . trametinib dimethyl sulfoxide (MEKINIST) 2 MG tablet Take 1 tablet (2  mg total) by mouth daily. Take 1 hour before or 2 hours after a meal. Store refrigerated in original container. 30 tablet 3   No current facility-administered medications for this visit.   Facility-Administered Medications Ordered in Other Visits  Medication Dose Route Frequency Provider Last Rate Last Admin  . 0.9 %  sodium chloride infusion   Intravenous Once Charlaine Dalton R, MD      . Zoledronic Acid (ZOMETA) IVPB 4 mg  4 mg Intravenous Once Cammie Sickle, MD          .  PHYSICAL EXAMINATION: ECOG PERFORMANCE STATUS: 0 - Asymptomatic  Vitals:   05/22/20 1036  BP: (!) 156/59  Pulse: 64  Resp: 16  Temp: (!) 96.9 F (36.1 C)  SpO2: 99%   Filed Weights   05/22/20 1036  Weight: 205 lb (93 kg)    Physical Exam Constitutional:      Comments: Ambulating by independently.  Accompanied by his wife.    HENT:     Head:  Normocephalic and atraumatic.     Mouth/Throat:     Pharynx: No oropharyngeal exudate.  Eyes:     Pupils: Pupils are equal, round, and reactive to light.  Cardiovascular:     Rate and Rhythm: Normal rate and regular rhythm.  Pulmonary:     Effort: Pulmonary effort is normal. No respiratory distress.     Breath sounds: Normal breath sounds. No wheezing.  Abdominal:     General: Bowel sounds are normal. There is no distension.     Palpations: Abdomen is soft. There is no mass.     Tenderness: There is no abdominal tenderness. There is no guarding or rebound.  Musculoskeletal:        General: No tenderness. Normal range of motion.     Cervical back: Normal range of motion and neck supple.  Skin:    General: Skin is warm.  Neurological:     Mental Status: He is alert and oriented to person, place, and time.  Psychiatric:        Mood and Affect: Affect normal.   ;  LABORATORY DATA:  I have reviewed the data as listed Lab Results  Component Value Date   WBC 5.3 05/22/2020   HGB 14.4 05/22/2020   HCT 44.7 05/22/2020   MCV 94.7 05/22/2020   PLT 182 05/22/2020   Recent Labs    04/09/20 1436 04/30/20 0937 05/22/20 1011  NA 137 134* 136  K 4.3 5.0 4.2  CL 102 97* 99  CO2 29 28 26   GLUCOSE 89 123* 111*  BUN 23 18 19   CREATININE 1.12 1.06 1.01  CALCIUM 8.9 9.3 8.7*  GFRNONAA >60 >60 >60  PROT 7.4 7.4 6.9  ALBUMIN 3.8 4.0 3.7  AST 21 35 31  ALT 21 22 24   ALKPHOS 66 122 71  BILITOT 0.3 0.8 0.8    RADIOGRAPHIC STUDIES: I have personally reviewed the radiological images as listed and agreed with the findings in the report. No results found.  ASSESSMENT & PLAN:   Primary cancer of right upper lobe of lung ([place]) # Stage IV/recurrent adenocarcinoma of the lung/ mediastinal lymphadenopathy/liver/  pancreatic head/left adrenal gland/ muscle and skeletal metastasis/Subcutaneous nodule/Brain- on B-RAF V600E- on dabrafenib plus Mekinist. STABLE.   # Tolerating well Dab+Mek  with mild to moderate side effects; clinical benefit noted. Will plan PET scan in 3 months- mid March 2022.  Will order at next visit.  # Fatigue-secondary malignancy/radiation- recommend continued exercise; await brain  MRI-with recommendation of driving/ [employer].   # Memory loss short-term-likely acute radiation side effect.  Hopefully to improve.  Monitor for now  # Brain metastases: s/p WBRT; overall improved.  Off steroids.  Repeat MRI-in 3 weeks ordered today.  # Bone lesions-mildly symptomatic; continue Zometa every 4-6 weeks [30 mins; tylenol]. with infusion premedicated with Tylenol at next visit.   # I spoke at length with the patient's wife- regarding the patient's clinical status/plan of care.  Family agreement.   # DISPOSITION:   # Zometa today over 30 mins # follow up in 3 weeks; MD- labs- cbc/cmp;MRI Brain Prior-Dr.B   All questions were answered. The patient knows to call the clinic with any problems, questions or concerns.    Cammie Sickle, MD 05/22/2020 11:17 AM

## 2020-05-22 NOTE — Assessment & Plan Note (Addendum)
#   Stage IV/recurrent adenocarcinoma of the lung/ mediastinal lymphadenopathy/liver/  pancreatic head/left adrenal gland/ muscle and skeletal metastasis/Subcutaneous nodule/Brain- on B-RAF V600E- on dabrafenib plus Mekinist. STABLE.   # Tolerating well Dab+Mek with mild to moderate side effects; clinical benefit noted. Will plan PET scan in 3 months- mid March 2022.  Will order at next visit.  # Fatigue-secondary malignancy/radiation- recommend continued exercise; await brain MRI-with recommendation of driving/ [employer].   # Memory loss short-term-likely acute radiation side effect.  Hopefully to improve.  Monitor for now  # Brain metastases: s/p WBRT; overall improved.  Off steroids.  Repeat MRI-in 3 weeks ordered today.  # Bone lesions-mildly symptomatic; continue Zometa every 4-6 weeks [30 mins; tylenol]. with infusion premedicated with Tylenol at next visit.   # I spoke at length with the patient's wife- regarding the patient's clinical status/plan of care.  Family agreement.   # DISPOSITION:   # Zometa today over 30 mins # follow up in 3 weeks; MD- labs- cbc/cmp;MRI Brain Prior-Dr.B

## 2020-06-06 ENCOUNTER — Ambulatory Visit
Admission: RE | Admit: 2020-06-06 | Discharge: 2020-06-06 | Disposition: A | Discharge status: Home / Self Care | Payer: PPO | Source: Ambulatory Visit | Attending: Internal Medicine | Admitting: Internal Medicine

## 2020-06-06 ENCOUNTER — Other Ambulatory Visit: Payer: Self-pay

## 2020-06-06 DIAGNOSIS — C3411 Malignant neoplasm of upper lobe, right bronchus or lung: Secondary | ICD-10-CM | POA: Insufficient documentation

## 2020-06-06 DIAGNOSIS — G319 Degenerative disease of nervous system, unspecified: Secondary | ICD-10-CM | POA: Diagnosis not present

## 2020-06-06 DIAGNOSIS — C7931 Secondary malignant neoplasm of brain: Secondary | ICD-10-CM | POA: Insufficient documentation

## 2020-06-06 DIAGNOSIS — C3490 Malignant neoplasm of unspecified part of unspecified bronchus or lung: Secondary | ICD-10-CM | POA: Diagnosis not present

## 2020-06-06 IMAGING — MR MR HEAD WO/W CM
9 series · 48 of 48 positions shown · IV contrast (gadavist)
Comparison: MRI head [DATE]

CLINICAL DATA: Non-small-cell lung cancer. Assess treatment
response for metastatic disease to brain.

EXAM:
MRI HEAD WITHOUT AND WITH CONTRAST
TECHNIQUE: Multiplanar, multiecho pulse sequences of the brain and surrounding
structures were obtained without and with intravenous contrast.
CONTRAST:  9mL GADAVIST GADOBUTROL 1 MMOL/ML IV SOLN

[Series 5: ax dwi_tracew · axial · 3.0mm · 0.65mm/px · z∈[-67,+81]mm · 4 of 48 slices shown]
[im 1/48]
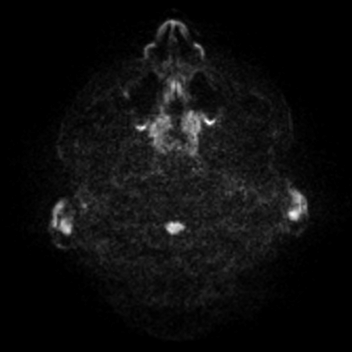
[im 16/48]
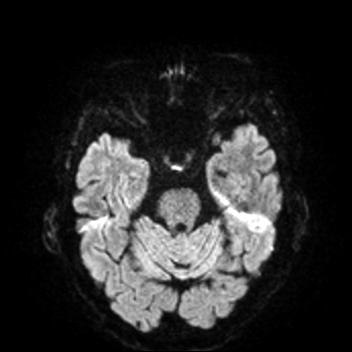
[im 32/48]
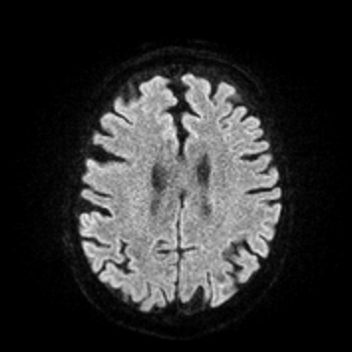
[im 48/48]
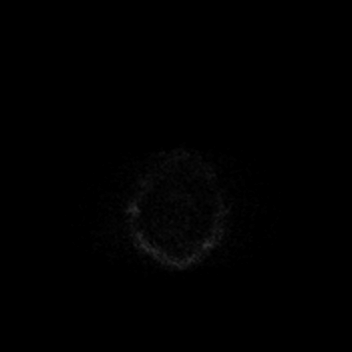

[Series 7: cor dwi_tracew · coronal · 5.0mm · 0.65mm/px · 3 of 40 slices shown]
[im 1/40]
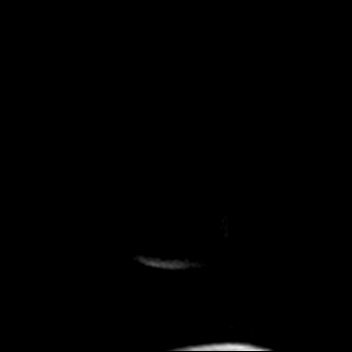
[im 20/40]
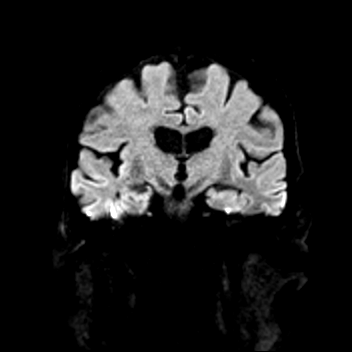
[im 40/40]
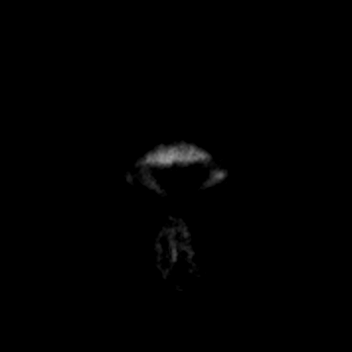

[Series 8: cor dwi_adc · coronal · 5.0mm · 0.65mm/px · 3 of 40 slices shown]
[im 1/40]
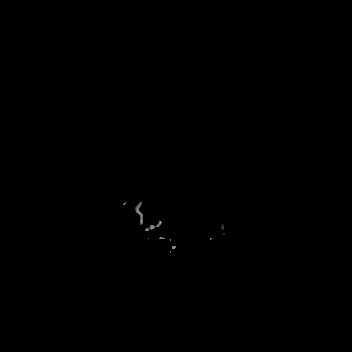
[im 20/40]
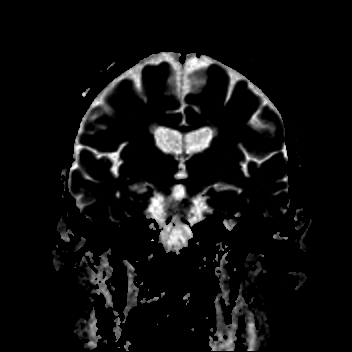
[im 40/40]
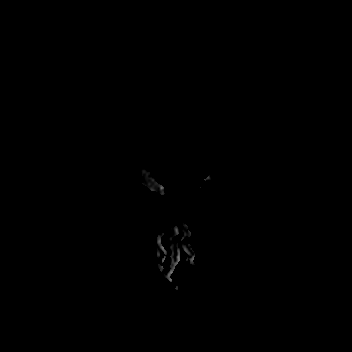

[Series 9: T1 · sagittal · 5.0mm · 0.62mm/px · 2 of 23 slices shown (1 of 2)]
[im 1/23]
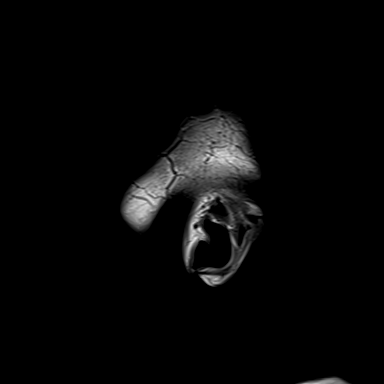
[im 23/23]
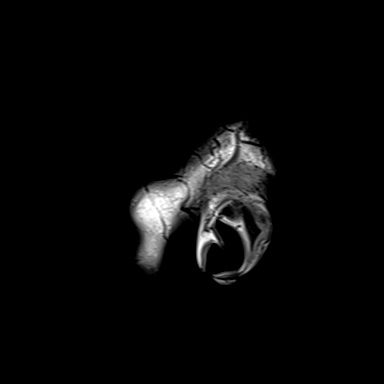

[Series 13: swi_images · axial · 3.0mm · 0.90mm/px · z∈[-77,+92]mm · 4 of 60 slices shown]
[im 1/60]
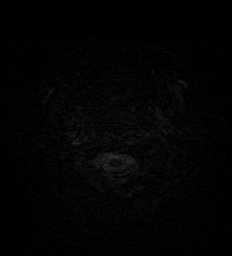
[im 20/60]
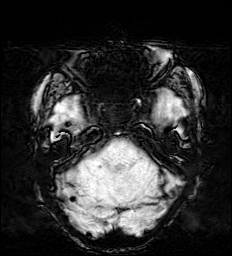
[im 40/60]
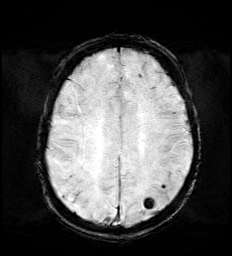
[im 60/60]
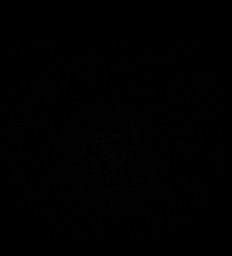

[Series 15: FLAIR · axial · 3.0mm · 0.53mm/px · z∈[-72,+83]mm · 4 of 55 slices shown]
[im 1/55]
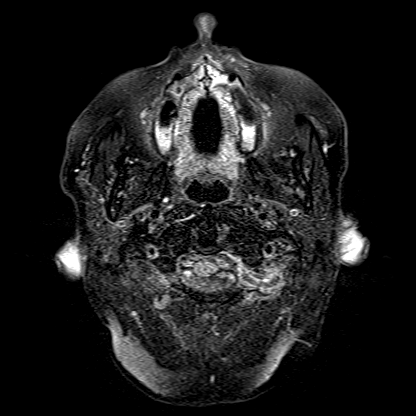
[im 19/55]
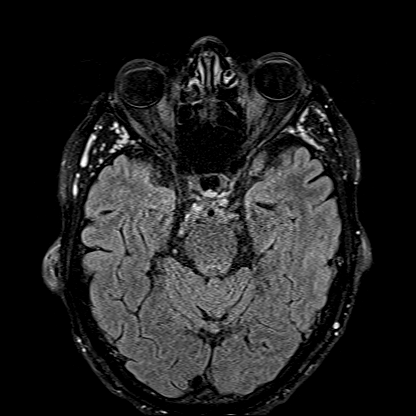
[im 37/55]
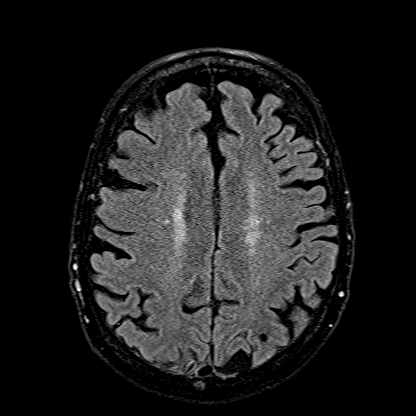
[im 55/55]
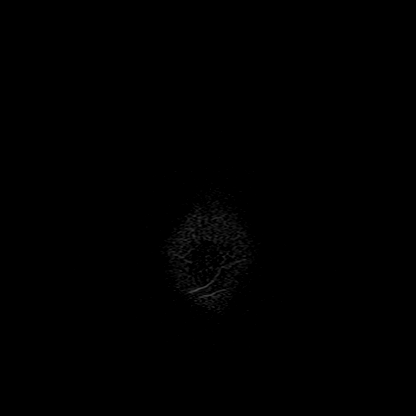

[Series 16: T1 · axial · 1.0mm · 0.98mm/px · z∈[-73,+94]mm · 13 of 176 slices shown (2 of 2)]
[im 1/176]
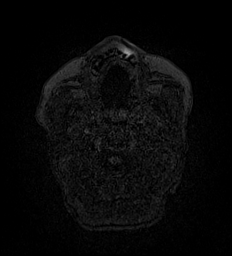
[im 15/176]
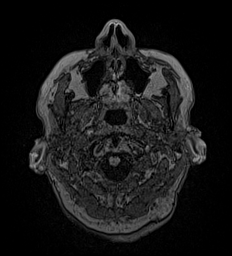
[im 30/176]
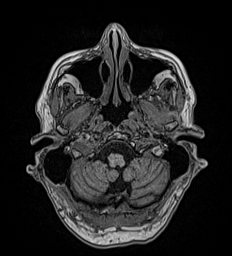
[im 44/176]
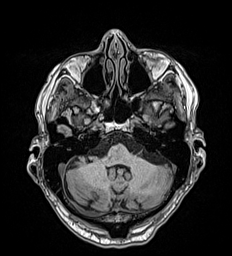
[im 59/176]
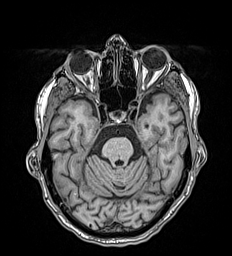
[im 73/176]
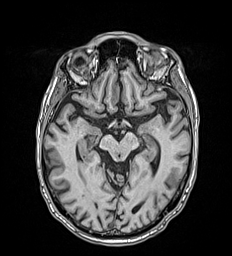
[im 88/176]
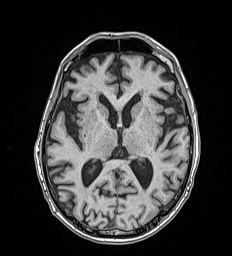
[im 103/176]
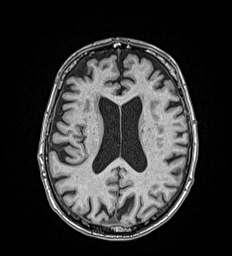
[im 117/176]
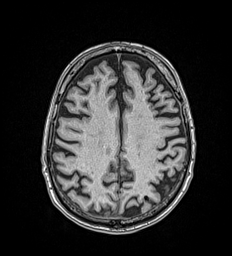
[im 132/176]
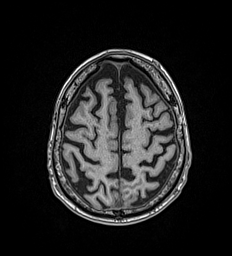
[im 146/176]
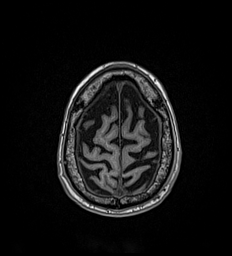
[im 161/176]
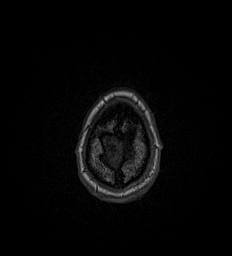
[im 176/176]
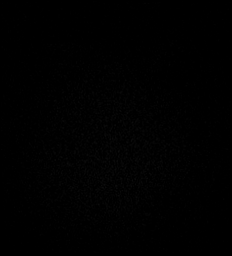

[Series 17: T2 post-contrast · coronal · 5.0mm · 0.57mm/px · 2 of 29 slices shown]
[im 1/29]
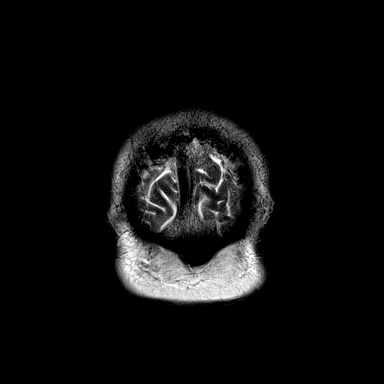
[im 29/29]
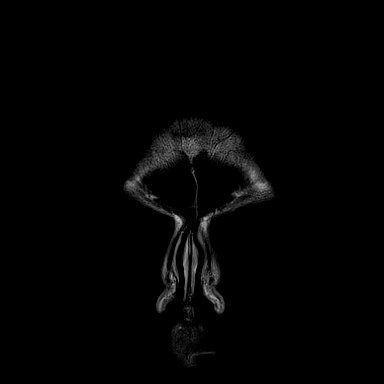

[Series 18: T1 post-contrast · axial · 1.0mm · 0.98mm/px · z∈[-73,+94]mm · 13 of 176 slices shown]
[im 1/176]
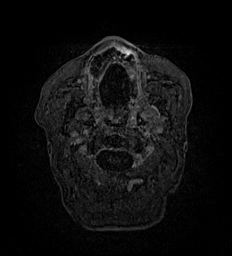
[im 15/176]
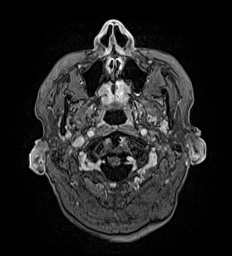
[im 30/176]
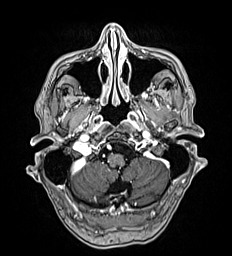
[im 44/176]
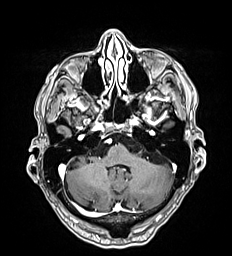
[im 59/176]
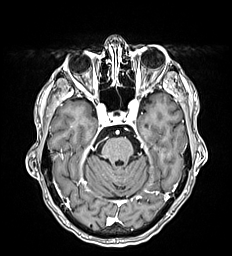
[im 73/176]
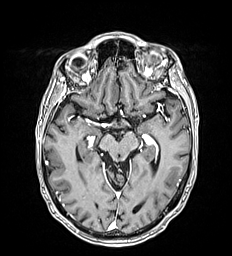
[im 88/176]
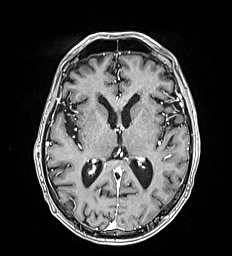
[im 103/176]
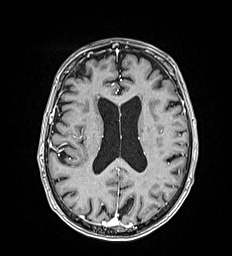
[im 117/176]
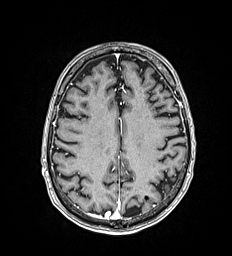
[im 132/176]
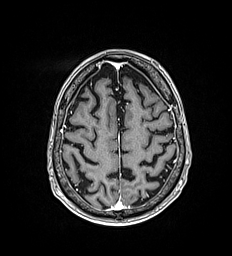
[im 146/176]
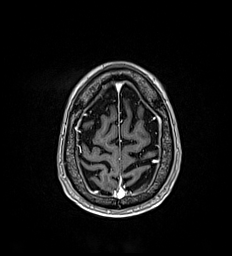
[im 161/176]
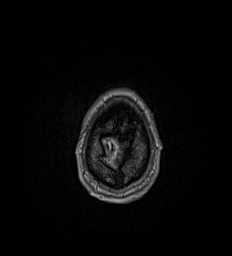
[im 176/176]
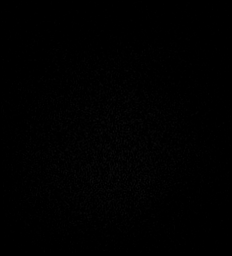

[48 of 48 positions shown; findings below may reference images not displayed]

FINDINGS: Brain: Prior study demonstrated multiple hemorrhagic metastatic
deposits in the brain. These appear improved. All lesions have
regressed or resolved. No new or enlarging lesions identified. As
noted previously, many of these lesions show evidence of prior
hemorrhage. No significant mass-effect or edema.

Generalized mild atrophy.  Ventricle size normal.  No acute infarct.

Vascular: Normal arterial flow voids.

Skull and upper cervical spine: No skeletal lesion identified.

Sinuses/Orbits: Paranasal sinuses clear. Bilateral cataract
extraction

Other: None
IMPRESSION: Interval improvement in numerous hemorrhagic metastasis to the
brain. Lesions have regressed since the prior study. No new lesions
identified.

## 2020-06-06 MED ORDER — GADOBUTROL 1 MMOL/ML IV SOLN
9.0000 mL | Freq: Once | INTRAVENOUS | Status: AC | PRN
  Administered 2020-06-06: 9 mL via INTRAVENOUS

## 2020-06-07 ENCOUNTER — Telehealth: Payer: Self-pay | Admitting: Internal Medicine

## 2020-06-07 NOTE — Telephone Encounter (Signed)
On 3/11-I spoke to patient regarding the results of the brain MRI negative-significant response noted post radiation; on current therapy.  Follow-up as planned.

## 2020-06-12 ENCOUNTER — Inpatient Hospital Stay: Payer: PPO | Admitting: Internal Medicine

## 2020-06-12 ENCOUNTER — Encounter: Payer: Self-pay | Admitting: Internal Medicine

## 2020-06-12 ENCOUNTER — Inpatient Hospital Stay: Payer: PPO | Attending: Internal Medicine

## 2020-06-12 DIAGNOSIS — Z87891 Personal history of nicotine dependence: Secondary | ICD-10-CM | POA: Diagnosis not present

## 2020-06-12 DIAGNOSIS — C7951 Secondary malignant neoplasm of bone: Secondary | ICD-10-CM | POA: Diagnosis not present

## 2020-06-12 DIAGNOSIS — C3411 Malignant neoplasm of upper lobe, right bronchus or lung: Secondary | ICD-10-CM | POA: Diagnosis not present

## 2020-06-12 DIAGNOSIS — C787 Secondary malignant neoplasm of liver and intrahepatic bile duct: Secondary | ICD-10-CM | POA: Insufficient documentation

## 2020-06-12 DIAGNOSIS — C7931 Secondary malignant neoplasm of brain: Secondary | ICD-10-CM | POA: Diagnosis not present

## 2020-06-12 LAB — CBC WITH DIFFERENTIAL/PLATELET
Abs Immature Granulocytes: 0.02 10*3/uL (ref 0.00–0.07)
Basophils Absolute: 0.1 10*3/uL (ref 0.0–0.1)
Basophils Relative: 1 %
Eosinophils Absolute: 0.2 10*3/uL (ref 0.0–0.5)
Eosinophils Relative: 3 %
HCT: 41.7 % (ref 39.0–52.0)
Hemoglobin: 13.9 g/dL (ref 13.0–17.0)
Immature Granulocytes: 0 %
Lymphocytes Relative: 29 %
Lymphs Abs: 1.7 10*3/uL (ref 0.7–4.0)
MCH: 31.8 pg (ref 26.0–34.0)
MCHC: 33.3 g/dL (ref 30.0–36.0)
MCV: 95.4 fL (ref 80.0–100.0)
Monocytes Absolute: 0.5 10*3/uL (ref 0.1–1.0)
Monocytes Relative: 9 %
Neutro Abs: 3.4 10*3/uL (ref 1.7–7.7)
Neutrophils Relative %: 58 %
Platelets: 228 10*3/uL (ref 150–400)
RBC: 4.37 MIL/uL (ref 4.22–5.81)
RDW: 17.9 % — ABNORMAL HIGH (ref 11.5–15.5)
WBC: 5.9 10*3/uL (ref 4.0–10.5)
nRBC: 0 % (ref 0.0–0.2)

## 2020-06-12 LAB — COMPREHENSIVE METABOLIC PANEL
ALT: 27 U/L (ref 0–44)
AST: 36 U/L (ref 15–41)
Albumin: 3.7 g/dL (ref 3.5–5.0)
Alkaline Phosphatase: 63 U/L (ref 38–126)
Anion gap: 10 (ref 5–15)
BUN: 17 mg/dL (ref 8–23)
CO2: 26 mmol/L (ref 22–32)
Calcium: 8.9 mg/dL (ref 8.9–10.3)
Chloride: 100 mmol/L (ref 98–111)
Creatinine, Ser: 1.06 mg/dL (ref 0.61–1.24)
GFR, Estimated: >60 mL/min (ref >60)
Glucose, Bld: 133 mg/dL — ABNORMAL HIGH (ref 70–99)
Potassium: 4.5 mmol/L (ref 3.5–5.1)
Sodium: 136 mmol/L (ref 135–145)
Total Bilirubin: 1.1 mg/dL (ref 0.3–1.2)
Total Protein: 7.3 g/dL (ref 6.5–8.1)

## 2020-06-12 NOTE — Progress Notes (Signed)
[name] NOTE  Patient Care Team: [name], MD as PCP - General (Family Medicine) [name], MD as Consulting Physician (Family Medicine) [name], [name], MD (General Surgery) Thornton Park, DO as Consulting Physician (Radiation Oncology)  CHIEF COMPLAINTS/PURPOSE OF CONSULTATION: Lung cancer  #  Oncology History Overview Note  # OCT 2018- STAGE I- A. 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  - PARENCHYMAL MARGIN APPEARS CLEAR; TUMOR WAS 1 CM FROM MARGIN BEFORE REMOVAL OF STAPLE LINE.; NO adjuvant therapy.   # DEC 2021-- METASTATIC CARCINOMA, COMPATIBLE WITH PULMONARY ADENOCARCINOMA [left shoulder Core Biopsy];  NOV 24th PET-right hilar and Hypermetabolic mediastinal lymphadenopathy; small pulmonary nodules; 2 metastatic lesions in the liver/  pancreatic head/left adrenal gland;  muscle and skeletal metastasis.  Subcutaneous nodule-positive for metastatic adeno lung.   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  Short course of Eliquis.smoking-quit 2008.  ---------------------------------------    DIAGNOSIS: LUNG CA  STAGE:  IV      ;GOALS:palliation  CURRENT/MOST RECENT THERAPY: DAB+TREM    Primary cancer of right upper lobe of lung ([place])  04/09/2020 Cancer Staging   Staging form: Lung, AJCC 8th Edition - Clinical: Stage IVB (cN1, cM1c) - Signed by Cammie Sickle, MD on 04/09/2020      HISTORY OF PRESENTING ILLNESS:  Curtis Andrade. 75 y.o.  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  Patient admits to mild fatigue.  Otherwise denies any  worsening joint pains; back pain.  Denies any new lumps or bumps.  Denies any headaches.  Review of Systems  Constitutional: Positive for malaise/fatigue. Negative for chills, diaphoresis, fever and weight loss.  HENT: Negative for nosebleeds and sore throat.   Eyes: Negative for double vision.  Respiratory: Negative for cough, hemoptysis, sputum production, shortness of breath and wheezing.   Cardiovascular: Negative for chest pain, palpitations, orthopnea and leg swelling.  Gastrointestinal: Negative for abdominal pain, blood in stool, constipation, diarrhea, heartburn, melena, nausea and vomiting.  Genitourinary: Negative for dysuria, frequency and urgency.  Musculoskeletal: Positive for back pain and joint pain.  Skin: Negative.  Negative for itching and rash.  Neurological: Positive for weakness. Negative for dizziness, tingling, focal weakness and headaches.  Endo/Heme/Allergies: Does not bruise/bleed easily.  Psychiatric/Behavioral: Positive for memory loss. Negative for depression. The patient is not nervous/anxious and does not have insomnia.      MEDICAL HISTORY:  Past Medical History:  Diagnosis Date  . 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 Colon polyp 2013  . Hemorrhoids    pt denies  . 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 Baruch Gouty and Dr. Nadeen Landau  . Phlebitis 1998  . Primary cancer of right upper lobe of lung ([place]) 01/14/2017   Dr. Genevive Bi performed a wedge resection of RUL.   . 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/27/2016    SURGICAL HISTORY: Past Surgical History:  Procedure Laterality Date  . COLONOSCOPY  2013   Dr Vira Agar  . COLONOSCOPY WITH PROPOFOL N/A 12/23/2016   Procedure:  COLONOSCOPY WITH PROPOFOL;  Surgeon: Robert Bellow, MD;  Location: [facility];  Service: Endoscopy;  Laterality: N/A;  . EYE SURGERY    . HERNIA REPAIR Right 1990's  . TENDON REPAIR Left 03/14/2019   Procedure: TENDON REPAIR AND GRAFT  TRANSFER EXTENSOR INDICIS PROPRIUS LEFT THUMB;  Surgeon: [name], MD;  Location: [facility];  Service: Orthopedics;  Laterality: Left;  AXILLARY BLOCK  . THORACOTOMY Right 01/14/2017   Procedure: RIGHT THORACOSCOPYWITH WIDE WEDGE RESECTION,  PREOP BROCHOSCOPY;  Surgeon: [name], MD;  Location: [facility] ORS;  Service: General;  Laterality: Right;  . THROAT SURGERY  2010    SOCIAL HISTORY: Social History   Socioeconomic History  . Marital status: Married    Spouse name: Not on file  . Number of children: Not on file  . Years of education: Not on file  . Highest education level: Not on file  Occupational History  . Not on file  Tobacco Use  . Smoking status: Former Smoker    Years: 50.00    Quit date: 03/31/2007    Years since quitting: 13.2  . Smokeless tobacco: Never Used  Vaping Use  . Vaping Use: Never used  Substance and Sexual Activity  . Alcohol use: No  . Drug use: No  . Sexual activity: Yes    Birth control/protection: None    Comment: Married  Other Topics Concern  . Not on file  Social History Narrative  . Not on file   Social Determinants of Health   Financial Resource Strain: Not on file  Food Insecurity: Not on file  Transportation Needs: Not on file  Physical Activity: Not on file  Stress: Not on file  Social Connections: Not on file  Intimate Partner Violence: Not on file    FAMILY HISTORY: Family History  Problem Relation Age of Onset  . Alzheimer's disease Mother   . Alzheimer's disease Father   . Stroke Father   . Colon cancer Neg Hx     ALLERGIES:  has No Known Allergies.  MEDICATIONS:  Current Outpatient Medications  Medication Sig Dispense Refill  . Ascorbic Acid (VITAMIN C)  1000 MG tablet Take 1,000 mg by mouth daily.    Marland Kitchen aspirin EC 81 MG tablet Take 81 mg by mouth daily.    . Calcium Carbonate-Vit D-Min (CALCIUM 1200 PO) Take 1 tablet by mouth daily.    Marland Kitchen dabrafenib mesylate (TAFINLAR) 75 MG capsule Take 2 capsules (150 mg total) by mouth 2 (two) times daily. Take on an empty stomach 1 hour before or 2 hours after meals. 678 capsule 3  . folic acid (FOLVITE) 938 MCG tablet Take 800 mcg by mouth daily.    . Glucosamine-Chondroitin (COSAMIN DS PO) Take 1 tablet by mouth daily.    . Multiple Vitamin (MULTIVITAMIN WITH MINERALS) TABS tablet Take 1 tablet by mouth daily. Senior Multivitamin.    . niacinamide 500 MG tablet Take 500 mg by mouth 2 (two) times daily with a meal.    . Omega-3 1000 MG CAPS Take 1 tablet by mouth 1 day or 1 dose.    Marland Kitchen omeprazole (PRILOSEC) 40 MG capsule Take 40 mg by mouth daily before supper.     . trametinib dimethyl sulfoxide (MEKINIST) 2 MG tablet Take 1 tablet (2 mg total) by mouth daily. Take 1 hour before or 2 hours after a meal. Store refrigerated in original  container. 30 tablet 3   No current facility-administered medications for this visit.      Marland Kitchen  PHYSICAL EXAMINATION: ECOG PERFORMANCE STATUS: 0 - Asymptomatic  Vitals:   06/12/20 1338  BP: 121/82  Pulse: 82  Resp: 16  Temp: (!) 97.3 F (36.3 C)  SpO2: 100%   Filed Weights   06/12/20 1338  Weight: 202 lb (91.6 kg)    Physical Exam Constitutional:      Comments: Ambulating by independently.  Accompanied by his wife.    HENT:     Head: Normocephalic and atraumatic.     Mouth/Throat:     Pharynx: No oropharyngeal exudate.  Eyes:     Pupils: Pupils are equal, round, and reactive to light.  Cardiovascular:     Rate and Rhythm: Normal rate and regular rhythm.  Pulmonary:     Effort: Pulmonary effort is normal. No respiratory distress.     Breath sounds: Normal breath sounds. No wheezing.  Abdominal:     General: Bowel sounds are normal. There is no  distension.     Palpations: Abdomen is soft. There is no mass.     Tenderness: There is no abdominal tenderness. There is no guarding or rebound.  Musculoskeletal:        General: No tenderness. Normal range of motion.     Cervical back: Normal range of motion and neck supple.  Skin:    General: Skin is warm.  Neurological:     Mental Status: He is alert and oriented to person, place, and time.  Psychiatric:        Mood and Affect: Affect normal.   ;  LABORATORY DATA:  I have reviewed the data as listed Lab Results  Component Value Date   WBC 5.9 06/12/2020   HGB 13.9 06/12/2020   HCT 41.7 06/12/2020   MCV 95.4 06/12/2020   PLT 228 06/12/2020   Recent Labs    04/30/20 0937 05/22/20 1011 06/12/20 1324  NA 134* 136 136  K 5.0 4.2 4.5  CL 97* 99 100  CO2 28 26 26   GLUCOSE 123* 111* 133*  BUN 18 19 17   CREATININE 1.06 1.01 1.06  CALCIUM 9.3 8.7* 8.9  GFRNONAA >60 >60 >60  PROT 7.4 6.9 7.3  ALBUMIN 4.0 3.7 3.7  AST 35 31 36  ALT 22 24 27   ALKPHOS 122 71 63  BILITOT 0.8 0.8 1.1    RADIOGRAPHIC STUDIES: I have personally reviewed the radiological images as listed and agreed with the findings in the report. MR Brain W Wo Contrast  Result Date: 06/07/2020 CLINICAL DATA:  Non-small-cell lung cancer. Assess treatment response for metastatic disease to brain. EXAM: MRI HEAD WITHOUT AND WITH CONTRAST TECHNIQUE: Multiplanar, multiecho pulse sequences of the brain and surrounding structures were obtained without and with intravenous contrast. CONTRAST:  77mL GADAVIST GADOBUTROL 1 MMOL/ML IV SOLN COMPARISON:  MRI head 03/17/2020 FINDINGS: Brain: Prior study demonstrated multiple hemorrhagic metastatic deposits in the brain. These appear improved. All lesions have regressed or resolved. No new or enlarging lesions identified. As noted previously, many of these lesions show evidence of prior hemorrhage. No significant mass-effect or edema. Generalized mild atrophy.  Ventricle size  normal.  No acute infarct. Vascular: Normal arterial flow voids. Skull and upper cervical spine: No skeletal lesion identified. Sinuses/Orbits: Paranasal sinuses clear. Bilateral cataract extraction Other: None IMPRESSION: Interval improvement in numerous hemorrhagic metastasis to the brain. Lesions have regressed since the prior study. No new lesions identified. Electronically Signed  By: Franchot Gallo M.D.   On: 06/07/2020 08:45    ASSESSMENT & PLAN:   Primary cancer of right upper lobe of lung ([place]) # Stage IV/recurrent adenocarcinoma of the lung/ mediastinal lymphadenopathy/liver/  pancreatic head/left adrenal gland/ muscle and skeletal metastasis/Subcutaneous nodule/Brain- on B-RAF V600E- on dabrafenib plus Mekinist. STABLE.   # Tolerating well Dab+Mek with mild to moderate side effects; clinical benefit noted.  Ordered PET scan today.  # Brain metastases: s/p WBRT; overall improved.  Off steroids; MRI brain showed significant improvement of metastatic lesions to the brain.  No new lesions noted.  Given the improved MRI brain I think is reasonable to try.  However patient was counseled that it would be important to watch out for symptoms for dizziness/or seizures/altered mental status that could adversely affect his driving.  # Bone lesions-mildly symptomatic; continue Zometa every 4-6 weeks [30 mins; tylenol]. with infusion premedicated with Tylenol at next visit.   # DISPOSITION:  # follow up in 4 weeks; MD- labs- cbc/cmp;PET scan prior- Zometa over 30 mins-Dr.B  # I reviewed the blood work- with the patient in detail; also reviewed the imaging independently [as summarized above]; and with the patient in detail.     All questions were answered. The patient knows to call the clinic with any problems, questions or concerns.    Cammie Sickle, MD 06/12/2020 7:45 PM

## 2020-06-12 NOTE — Assessment & Plan Note (Addendum)
#   Stage IV/recurrent adenocarcinoma of the lung/ mediastinal lymphadenopathy/liver/  pancreatic head/left adrenal gland/ muscle and skeletal metastasis/Subcutaneous nodule/Brain- on B-RAF V600E- on dabrafenib plus Mekinist. STABLE.   # Tolerating well Dab+Mek with mild to moderate side effects; clinical benefit noted.  Ordered PET scan today.  # Brain metastases: s/p WBRT; overall improved.  Off steroids; MRI brain showed significant improvement of metastatic lesions to the brain.  No new lesions noted.  Given the improved MRI brain I think is reasonable to try.  However patient was counseled that it would be important to watch out for symptoms for dizziness/or seizures/altered mental status that could adversely affect his driving.  # Bone lesions-mildly symptomatic; continue Zometa every 4-6 weeks [30 mins; tylenol]. with infusion premedicated with Tylenol at next visit.   # DISPOSITION:  # follow up in 4 weeks; MD- labs- cbc/cmp;PET scan prior- Zometa over 30 mins-Dr.B  # I reviewed the blood work- with the patient in detail; also reviewed the imaging independently [as summarized above]; and with the patient in detail.

## 2020-06-13 ENCOUNTER — Other Ambulatory Visit: Payer: Self-pay | Admitting: Pharmacist

## 2020-06-13 DIAGNOSIS — C3411 Malignant neoplasm of upper lobe, right bronchus or lung: Secondary | ICD-10-CM

## 2020-06-13 MED ORDER — TRAMETINIB DIMETHYL SULFOXIDE 2 MG PO TABS: 2.0000 mg | ORAL_TABLET | Freq: Every day | ORAL | 3 refills | Status: DC

## 2020-06-13 MED ORDER — DABRAFENIB MESYLATE 75 MG PO CAPS
150.0000 mg | ORAL_CAPSULE | Freq: Two times a day (BID) | ORAL | 3 refills | Status: DC
Start: 2020-06-13 — End: 2020-10-11

## 2020-06-25 ENCOUNTER — Ambulatory Visit
Admission: RE | Admit: 2020-06-25 | Discharge: 2020-06-25 | Disposition: A | Discharge status: Home / Self Care | Payer: PPO | Source: Ambulatory Visit | Attending: Internal Medicine | Admitting: Internal Medicine

## 2020-06-25 ENCOUNTER — Other Ambulatory Visit: Payer: Self-pay

## 2020-06-25 DIAGNOSIS — J439 Emphysema, unspecified: Secondary | ICD-10-CM | POA: Insufficient documentation

## 2020-06-25 DIAGNOSIS — I700 Atherosclerosis of aorta: Secondary | ICD-10-CM | POA: Diagnosis not present

## 2020-06-25 DIAGNOSIS — C3411 Malignant neoplasm of upper lobe, right bronchus or lung: Secondary | ICD-10-CM | POA: Diagnosis not present

## 2020-06-25 DIAGNOSIS — C3490 Malignant neoplasm of unspecified part of unspecified bronchus or lung: Secondary | ICD-10-CM | POA: Diagnosis not present

## 2020-06-25 LAB — GLUCOSE, CAPILLARY: Glucose-Capillary: 113 mg/dL — ABNORMAL HIGH (ref 70–99)

## 2020-06-25 IMAGING — CT NM PET TUM IMG RESTAG (PS) SKULL BASE T - THIGH
1 of 10 series · 1 of 25 positions shown · non-contrast
Comparison: [DATE]

CLINICAL DATA: Subsequent treatment strategy for non-small-cell
lung cancer.

EXAM:
NUCLEAR MEDICINE PET SKULL BASE TO THIGH
TECHNIQUE: 11.4 mCi F-18 FDG was injected intravenously. Full-ring PET imaging
was performed from the skull base to thigh after the radiotracer. CT
data was obtained and used for attenuation correction and anatomic
localization.
Fasting blood glucose: 113 mg/dl

[Series 3: ct wb 5.0 b30f · axial · 5.0mm · 0.98mm/px · 1 of 329 slices shown]
[im 329/329  brain]
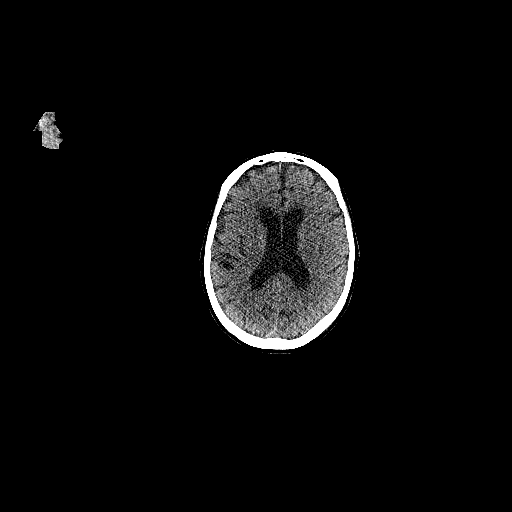

[1 of 25 positions shown; findings below may reference images not displayed]

FINDINGS: Mediastinal blood pool activity: SUV max

Liver activity: SUV max NA

NECK: No hypermetabolic lymph nodes in the neck.

Incidental CT findings: none

CHEST: Marked interval response to therapy of hypermetabolic
thoracic lymphadenopathy seen previously.

9 mm upper right paratracheal node identified previously has
resolved completely.

Right-sided precarinal node measured previously at 11 mm short axis
is 7 mm short axis today. SUV max = 3.3, decreased from
previously.

Index subcarinal node measured previously at 10 mm is now 6 mm
(107/3) with SUV max = 2.9, decreased from 7.0 previously.

The largest, most suspicious pulmonary nodule seen previously in the
medial right upper lobe and medial left upper lobe have resolved
completely in the interval. Tiny nodules seen previously in the
posterior right middle lobe and anterior right middle lobe (images
115 and 121 of series 3 today) are stable. No new suspicious
pulmonary nodule or mass on today's study.

Multiple hypermetabolic lesions within the musculature of the left
shoulder have resolved completely in the interval with no uptake
evident in this region above background muscle activity.

Incidental CT findings: Coronary artery calcification is evident.
Atherosclerotic calcification is noted in the wall of the thoracic
aorta. Surgical staple line noted medial right lung. Centrilobular
emphysema noted.

ABDOMEN/PELVIS: 15 mm low-density lesion in the lateral segment left
liver identified previously has decreased to 5 mm in the interval.
No residual hypermetabolism above background hepatic parenchymal
levels today. Similarly, the 8 mm low-density lesion in the extreme
inferior tip of the right liver seen to be hypermetabolic on the
previous study has decreased substantially with no lesion visible at
this location on CT imaging today and no hypermetabolism at this
location on PET imaging.

Similar diffuse thickening of the left adrenal gland on today's exam
with no hypermetabolic focus as noted on the prior study.

No hypermetabolic focus in the head of pancreas on today's study as
noted previously.

Hypermetabolic lesions in the left psoas muscle and left paraspinal
musculature have resolved.

Incidental CT findings: Small hypodensity in the anterior hepatic
dome is stable. This was not hypermetabolic on previous PET-CT.
Calcified gallstone evident. There is abdominal aortic
atherosclerosis with maximum diameter of the infrarenal abdominal
aorta measuring 3.3 cm. Diverticular disease noted left colon
without diverticulitis.

SKELETON: No hypermetabolic osseous lesions on today's study
although numerous new densely sclerotic bone lesions are evident
consistent with interval healing of metastatic disease. 15 mm
sclerotic index lesion noted in the T12 vertebral body on 161/3 with
no lesion visible at this location previously. Another example would
be 3 sclerotic lesions in the L2 vertebral body (184/3) with only
very subtle lesions evident on CT at this location previously.

Incidental CT findings: none
IMPRESSION: 1. Marked interval response to therapy with complete resolution of
the most suspicious pulmonary nodules. Hypermetabolic lesions in the
liver, left adrenal gland, head of pancreas region, muscle, and bone
have resolved completely in the interval. Bone lesions of become
densely sclerotic in the interval consistent with interval healing.
2. Previously identified mediastinal lymph nodes have decreased in
size substantially in show only low level FDG activity, near blood
pool uptake. Right precarinal node described previously although
substantially decreased in size does show low level hypermetabolism
today and attention on follow-up recommended.
3. Persistent tiny right pulmonary nodules, unchanged in the
interval. Given response to therapy of other disease, these are felt
to be unlikely related to metastatic involvement, but attention on
follow-up recommended.
4.  Aortic Atherosclerois ([OD]-170.0)
5.  Emphysema. ([OD]-[OD])

## 2020-06-25 MED ORDER — FLUDEOXYGLUCOSE F - 18 (FDG) INJECTION
11.4300 | Freq: Once | INTRAVENOUS | Status: AC | PRN
  Administered 2020-06-25: 11.43 via INTRAVENOUS

## 2020-06-27 ENCOUNTER — Telehealth: Payer: Self-pay | Admitting: Internal Medicine

## 2020-06-27 NOTE — Telephone Encounter (Signed)
On 3/30-I spoke to patient/wife regarding the loss of the PET scan; significant improvement noted on current therapy.  Recommend keep appointment as planned/next week.

## 2020-07-09 ENCOUNTER — Encounter: Payer: Self-pay | Admitting: Internal Medicine

## 2020-07-09 ENCOUNTER — Inpatient Hospital Stay: Payer: PPO

## 2020-07-09 ENCOUNTER — Inpatient Hospital Stay: Payer: PPO | Attending: Internal Medicine

## 2020-07-09 ENCOUNTER — Inpatient Hospital Stay: Payer: PPO | Admitting: Internal Medicine

## 2020-07-09 DIAGNOSIS — C3411 Malignant neoplasm of upper lobe, right bronchus or lung: Secondary | ICD-10-CM | POA: Insufficient documentation

## 2020-07-09 DIAGNOSIS — C7951 Secondary malignant neoplasm of bone: Secondary | ICD-10-CM | POA: Insufficient documentation

## 2020-07-09 DIAGNOSIS — C7931 Secondary malignant neoplasm of brain: Secondary | ICD-10-CM | POA: Insufficient documentation

## 2020-07-09 DIAGNOSIS — C787 Secondary malignant neoplasm of liver and intrahepatic bile duct: Secondary | ICD-10-CM | POA: Insufficient documentation

## 2020-07-09 DIAGNOSIS — K5900 Constipation, unspecified: Secondary | ICD-10-CM | POA: Diagnosis not present

## 2020-07-09 LAB — COMPREHENSIVE METABOLIC PANEL
ALT: 20 U/L (ref 0–44)
AST: 28 U/L (ref 15–41)
Albumin: 3.7 g/dL (ref 3.5–5.0)
Alkaline Phosphatase: 49 U/L (ref 38–126)
Anion gap: 8 (ref 5–15)
BUN: 18 mg/dL (ref 8–23)
CO2: 27 mmol/L (ref 22–32)
Calcium: 9 mg/dL (ref 8.9–10.3)
Chloride: 101 mmol/L (ref 98–111)
Creatinine, Ser: 0.94 mg/dL (ref 0.61–1.24)
GFR, Estimated: >60 mL/min (ref >60)
Glucose, Bld: 121 mg/dL — ABNORMAL HIGH (ref 70–99)
Potassium: 4.7 mmol/L (ref 3.5–5.1)
Sodium: 136 mmol/L (ref 135–145)
Total Bilirubin: 0.9 mg/dL (ref 0.3–1.2)
Total Protein: 6.7 g/dL (ref 6.5–8.1)

## 2020-07-09 LAB — CBC WITH DIFFERENTIAL/PLATELET
Abs Immature Granulocytes: 0.02 10*3/uL (ref 0.00–0.07)
Basophils Absolute: 0 10*3/uL (ref 0.0–0.1)
Basophils Relative: 1 %
Eosinophils Absolute: 0.2 10*3/uL (ref 0.0–0.5)
Eosinophils Relative: 3 %
HCT: 40.4 % (ref 39.0–52.0)
Hemoglobin: 13.7 g/dL (ref 13.0–17.0)
Immature Granulocytes: 0 %
Lymphocytes Relative: 30 %
Lymphs Abs: 1.7 10*3/uL (ref 0.7–4.0)
MCH: 33.5 pg (ref 26.0–34.0)
MCHC: 33.9 g/dL (ref 30.0–36.0)
MCV: 98.8 fL (ref 80.0–100.0)
Monocytes Absolute: 0.5 10*3/uL (ref 0.1–1.0)
Monocytes Relative: 10 %
Neutro Abs: 3.3 10*3/uL (ref 1.7–7.7)
Neutrophils Relative %: 56 %
Platelets: 159 10*3/uL (ref 150–400)
RBC: 4.09 MIL/uL — ABNORMAL LOW (ref 4.22–5.81)
RDW: 17.2 % — ABNORMAL HIGH (ref 11.5–15.5)
WBC: 5.7 10*3/uL (ref 4.0–10.5)
nRBC: 0 % (ref 0.0–0.2)

## 2020-07-09 MED ORDER — ZOLEDRONIC ACID 4 MG/100ML IV SOLN: 4.0000 mg | Freq: Once | INTRAVENOUS | Status: DC

## 2020-07-09 MED ORDER — ZOLEDRONIC ACID 4 MG/5ML IV CONC
4.0000 mg | Freq: Once | INTRAVENOUS | Status: DC
  Filled 2020-07-09: qty 5

## 2020-07-09 MED ORDER — ZOLEDRONIC ACID 4 MG/5ML IV CONC
4.0000 mg | Freq: Once | INTRAVENOUS | Status: AC
Start: 2020-07-09 — End: 2020-07-09
  Administered 2020-07-09: 4 mg via INTRAVENOUS
  Filled 2020-07-09: qty 5

## 2020-07-09 MED ORDER — SODIUM CHLORIDE 0.9 % IV SOLN
Freq: Once | INTRAVENOUS | Status: AC
  Filled 2020-07-09: qty 250

## 2020-07-09 NOTE — Assessment & Plan Note (Addendum)
#   Stage IV/recurrent adenocarcinoma of the lung/ mediastinal lymphadenopathy/liver/  pancreatic head/left adrenal gland/ muscle and skeletal metastasis/Subcutaneous nodule/Brain- on B-RAF V600E- on dabrafenib plus Mekinist. PET scan- MARCH 28th, 2022-near complete response of the lung nodules; liver lesions; adrenal gland lesion; head of pancreas. Bone lesions-more sclerotic  # Tolerating well Dab+Mek with mild to moderate side effects- esp fatigue.   # fatigue-multifactorial -post radiation/TKI.  Check TSH at next visit  # Brain metastases: s/p WBRT; overall improved.  Off steroids; MARCH 2022- MRI brain showed significant improvement of metastatic lesions to the brain.   # Bone lesions-mildly symptomatic; continue Zometa every 4-6 weeks [30 mins; tylenol]. with infusion premedicated with Tylenol at next visit.   # DISPOSITION:  # follow up in 4 weeks; MD- labs- cbc/cmp; Zometa over 30 mins-Dr.B  # I reviewed the blood work- with the patient in detail; also reviewed the imaging independently [as summarized above]; and with the patient in detail.   Marland Kitchen

## 2020-07-09 NOTE — Progress Notes (Signed)
Patient here for oncology follow-up appointment, expresses new concerns

## 2020-07-09 NOTE — Progress Notes (Signed)
[place] NOTE  Patient Care Team: [name], MD as PCP - General (Family Medicine) [name], MD as Consulting Physician (Family Medicine) [name], [name], MD (General Surgery) Thornton Park, DO as Consulting Physician (Radiation Oncology)  CHIEF COMPLAINTS/PURPOSE OF CONSULTATION: Lung cancer  #  Oncology History Overview Note  # OCT 2018- STAGE I- A. 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  - PARENCHYMAL MARGIN APPEARS CLEAR; TUMOR WAS 1 CM FROM MARGIN BEFORE REMOVAL OF STAPLE LINE.; NO adjuvant therapy.   # DEC 2021-- METASTATIC CARCINOMA, COMPATIBLE WITH PULMONARY ADENOCARCINOMA [left shoulder Core Biopsy];  NOV 24th PET-right hilar and Hypermetabolic mediastinal lymphadenopathy; small pulmonary nodules; 2 metastatic lesions in the liver/  pancreatic head/left adrenal gland;  muscle and skeletal metastasis.  Subcutaneous nodule-positive for metastatic adeno lung.   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  Short course of Eliquis.smoking-quit 2008.  ---------------------------------------    DIAGNOSIS: LUNG CA  STAGE:  IV      ;GOALS:palliation  CURRENT/MOST RECENT THERAPY: DAB+TREM    Primary cancer of right upper lobe of lung ([place])  04/09/2020 Cancer Staging   Staging form: Lung, AJCC 8th Edition - Clinical: Stage IVB (cN1, cM1c) - Signed by Cammie Sickle, MD on 04/09/2020      HISTORY OF PRESENTING ILLNESS:  Curtis Andrade. 75 y.o.  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  Patient denies any worsening joint pains or back pain.  Appetite is good.  Complains of fatigue.  No nausea no vomiting headaches.  Mild constipation using Senokot.  Review of Systems  Constitutional: Positive for malaise/fatigue. Negative for chills, diaphoresis, fever and weight loss.  HENT: Negative for nosebleeds and sore throat.   Eyes: Negative for double vision.  Respiratory: Negative for cough, hemoptysis, sputum production, shortness of breath and wheezing.   Cardiovascular: Negative for chest pain, palpitations, orthopnea and leg swelling.  Gastrointestinal: Negative for abdominal pain, blood in stool, constipation, diarrhea, heartburn, melena, nausea and vomiting.  Genitourinary: Negative for dysuria, frequency and urgency.  Musculoskeletal: Positive for back pain and joint pain.  Skin: Negative.  Negative for itching and rash.  Neurological: Positive for weakness. Negative for dizziness, tingling, focal weakness and headaches.  Endo/Heme/Allergies: Does not bruise/bleed easily.  Psychiatric/Behavioral: Positive for memory loss. Negative for depression. The patient is not nervous/anxious and does not have insomnia.      MEDICAL HISTORY:  Past Medical History:  Diagnosis Date  . 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 Colon polyp 2013  . Hemorrhoids    pt denies  . 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 Baruch Gouty and Dr. Nadeen Landau  . Phlebitis 1998  . Primary cancer of right upper lobe of lung ([place]) 01/14/2017   Dr. Genevive Bi performed a wedge resection of RUL.   . 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/27/2016    SURGICAL HISTORY: Past Surgical History:  Procedure Laterality Date  .  COLONOSCOPY  2013   Dr Vira Agar  . COLONOSCOPY WITH PROPOFOL N/A 12/23/2016    Procedure: COLONOSCOPY WITH PROPOFOL;  Surgeon: Robert Bellow, MD;  Location: [facility];  Service: Endoscopy;  Laterality: N/A;  . EYE SURGERY    . HERNIA REPAIR Right 1990's  . TENDON REPAIR Left 03/14/2019   Procedure: TENDON REPAIR AND GRAFT  TRANSFER EXTENSOR INDICIS PROPRIUS LEFT THUMB;  Surgeon: [name], MD;  Location: [facility];  Service: Orthopedics;  Laterality: Left;  AXILLARY BLOCK  . THORACOTOMY Right 01/14/2017   Procedure: RIGHT THORACOSCOPYWITH WIDE WEDGE RESECTION,  PREOP BROCHOSCOPY;  Surgeon: [name], MD;  Location: [facility] ORS;  Service: General;  Laterality: Right;  . THROAT SURGERY  2010    SOCIAL HISTORY: Social History   Socioeconomic History  . Marital status: Married    Spouse name: Not on file  . Number of children: Not on file  . Years of education: Not on file  . Highest education level: Not on file  Occupational History  . Not on file  Tobacco Use  . Smoking status: Former Smoker    Years: 50.00    Quit date: 03/31/2007    Years since quitting: 13.2  . Smokeless tobacco: Never Used  Vaping Use  . Vaping Use: Never used  Substance and Sexual Activity  . Alcohol use: No  . Drug use: No  . Sexual activity: Yes    Birth control/protection: None    Comment: Married  Other Topics Concern  . Not on file  Social History Narrative  . Not on file   Social Determinants of Health   Financial Resource Strain: Not on file  Food Insecurity: Not on file  Transportation Needs: Not on file  Physical Activity: Not on file  Stress: Not on file  Social Connections: Not on file  Intimate Partner Violence: Not on file    FAMILY HISTORY: Family History  Problem Relation Age of Onset  . Alzheimer's disease Mother   . Alzheimer's disease Father   . Stroke Father   . Colon cancer Neg Hx     ALLERGIES:  has No Known Allergies.  MEDICATIONS:  Current Outpatient Medications  Medication Sig Dispense Refill  . Ascorbic  Acid (VITAMIN C) 1000 MG tablet Take 1,000 mg by mouth daily.    Marland Kitchen aspirin EC 81 MG tablet Take 81 mg by mouth daily.    . Calcium Carbonate-Vit D-Min (CALCIUM 1200 PO) Take 1 tablet by mouth daily.    Marland Kitchen dabrafenib mesylate (TAFINLAR) 75 MG capsule Take 2 capsules (150 mg total) by mouth 2 (two) times daily. Take on an empty stomach 1 hour before or 2 hours after meals. 194 capsule 3  . folic acid (FOLVITE) 174 MCG tablet Take 800 mcg by mouth daily.    . Glucosamine-Chondroitin (COSAMIN DS PO) Take 1 tablet by mouth daily.    . Multiple Vitamin (MULTIVITAMIN WITH MINERALS) TABS tablet Take 1 tablet by mouth daily. Senior Multivitamin.    . niacinamide 500 MG tablet Take 500 mg by mouth 2 (two) times daily with a meal.    . Omega-3 1000 MG CAPS Take 1 tablet by mouth 1 day or 1 dose.    Marland Kitchen omeprazole (PRILOSEC) 40 MG capsule Take 40 mg by mouth daily before supper.     . senna (SENOKOT) 8.6 MG tablet Take 1 tablet by mouth daily.    . trametinib dimethyl sulfoxide (MEKINIST) 2 MG tablet Take 1 tablet (2 mg total)  by mouth daily. Take 1 hour before or 2 hours after a meal. Store refrigerated in original container. 30 tablet 3   No current facility-administered medications for this visit.   Facility-Administered Medications Ordered in Other Visits  Medication Dose Route Frequency Provider Last Rate Last Admin  . zolendronic acid (ZOMETA) 4 mg in sodium chloride 0.9 % 100 mL IVPB  4 mg Intravenous Once Cammie Sickle, MD 210 mL/hr at 07/09/20 1504 4 mg at 07/09/20 1504      .  PHYSICAL EXAMINATION: ECOG PERFORMANCE STATUS: 0 - Asymptomatic  Vitals:   07/09/20 1329  BP: (!) 157/80  Pulse: 77  Resp: 18  Temp: 97.8 F (36.6 C)  SpO2: 99%   Filed Weights   07/09/20 1329  Weight: 204 lb (92.5 kg)    Physical Exam Constitutional:      Comments: Ambulating by independently.  Alone.    HENT:     Head: Normocephalic and atraumatic.     Mouth/Throat:     Pharynx: No  oropharyngeal exudate.  Eyes:     Pupils: Pupils are equal, round, and reactive to light.  Cardiovascular:     Rate and Rhythm: Normal rate and regular rhythm.  Pulmonary:     Effort: Pulmonary effort is normal. No respiratory distress.     Breath sounds: Normal breath sounds. No wheezing.  Abdominal:     General: Bowel sounds are normal. There is no distension.     Palpations: Abdomen is soft. There is no mass.     Tenderness: There is no abdominal tenderness. There is no guarding or rebound.  Musculoskeletal:        General: No tenderness. Normal range of motion.     Cervical back: Normal range of motion and neck supple.  Skin:    General: Skin is warm.  Neurological:     Mental Status: He is alert and oriented to person, place, and time.  Psychiatric:        Mood and Affect: Affect normal.   ;  LABORATORY DATA:  I have reviewed the data as listed Lab Results  Component Value Date   WBC 5.7 07/09/2020   HGB 13.7 07/09/2020   HCT 40.4 07/09/2020   MCV 98.8 07/09/2020   PLT 159 07/09/2020   Recent Labs    05/22/20 1011 06/12/20 1324 07/09/20 1254  NA 136 136 136  K 4.2 4.5 4.7  CL 99 100 101  CO2 26 26 27   GLUCOSE 111* 133* 121*  BUN 19 17 18   CREATININE 1.01 1.06 0.94  CALCIUM 8.7* 8.9 9.0  GFRNONAA >60 >60 >60  PROT 6.9 7.3 6.7  ALBUMIN 3.7 3.7 3.7  AST 31 36 28  ALT 24 27 20   ALKPHOS 71 63 49  BILITOT 0.8 1.1 0.9    RADIOGRAPHIC STUDIES: I have personally reviewed the radiological images as listed and agreed with the findings in the report. NM PET Image Restag (PS) Skull Base To Thigh  Result Date: 06/26/2020 CLINICAL DATA:  Subsequent treatment strategy for non-small-cell lung cancer. EXAM: NUCLEAR MEDICINE PET SKULL BASE TO THIGH TECHNIQUE: 11.4 mCi F-18 FDG was injected intravenously. Full-ring PET imaging was performed from the skull base to thigh after the radiotracer. CT data was obtained and used for attenuation correction and anatomic  localization. Fasting blood glucose: 113 mg/dl COMPARISON:  02/20/2020 FINDINGS: Mediastinal blood pool activity: SUV max 2.5 Liver activity: SUV max NA NECK: No hypermetabolic lymph nodes in the neck. Incidental CT findings: none  CHEST: Marked interval response to therapy of hypermetabolic thoracic lymphadenopathy seen previously. 9 mm upper right paratracheal node identified previously has resolved completely. Right-sided precarinal node measured previously at 11 mm short axis is 7 mm short axis today. SUV max = 3.3, decreased from 6.1 previously. Index subcarinal node measured previously at 10 mm is now 6 mm (107/3) with SUV max = 2.9, decreased from 7.0 previously. The largest, most suspicious pulmonary nodule seen previously in the medial right upper lobe and medial left upper lobe have resolved completely in the interval. Tiny nodules seen previously in the posterior right middle lobe and anterior right middle lobe (images 115 and 121 of series 3 today) are stable. No new suspicious pulmonary nodule or mass on today's study. Multiple hypermetabolic lesions within the musculature of the left shoulder have resolved completely in the interval with no uptake evident in this region above background muscle activity. Incidental CT findings: Coronary artery calcification is evident. Atherosclerotic calcification is noted in the wall of the thoracic aorta. Surgical staple line noted medial right lung. Centrilobular emphysema noted. ABDOMEN/PELVIS: 15 mm low-density lesion in the lateral segment left liver identified previously has decreased to 5 mm in the interval. No residual hypermetabolism above background hepatic parenchymal levels today. Similarly, the 8 mm low-density lesion in the extreme inferior tip of the right liver seen to be hypermetabolic on the previous study has decreased substantially with no lesion visible at this location on CT imaging today and no hypermetabolism at this location on PET imaging.  Similar diffuse thickening of the left adrenal gland on today's exam with no hypermetabolic focus as noted on the prior study. No hypermetabolic focus in the head of pancreas on today's study as noted previously. Hypermetabolic lesions in the left psoas muscle and left paraspinal musculature have resolved. Incidental CT findings: Small hypodensity in the anterior hepatic dome is stable. This was not hypermetabolic on previous PET-CT. Calcified gallstone evident. There is abdominal aortic atherosclerosis with maximum diameter of the infrarenal abdominal aorta measuring 3.3 cm. Diverticular disease noted left colon without diverticulitis. SKELETON: No hypermetabolic osseous lesions on today's study although numerous new densely sclerotic bone lesions are evident consistent with interval healing of metastatic disease. 15 mm sclerotic index lesion noted in the T12 vertebral body on 161/3 with no lesion visible at this location previously. Another example would be 3 sclerotic lesions in the L2 vertebral body (184/3) with only very subtle lesions evident on CT at this location previously. Incidental CT findings: none IMPRESSION: 1. Marked interval response to therapy with complete resolution of the most suspicious pulmonary nodules. Hypermetabolic lesions in the liver, left adrenal gland, head of pancreas region, muscle, and bone have resolved completely in the interval. Bone lesions of become densely sclerotic in the interval consistent with interval healing. 2. Previously identified mediastinal lymph nodes have decreased in size substantially in show only low level FDG activity, near blood pool uptake. Right precarinal node described previously although substantially decreased in size does show low level hypermetabolism today and attention on follow-up recommended. 3. Persistent tiny right pulmonary nodules, unchanged in the interval. Given response to therapy of other disease, these are felt to be unlikely related to  metastatic involvement, but attention on follow-up recommended. 4.  Aortic Atherosclerois (ICD10-170.0) 5.  Emphysema. ([id number]) Electronically Signed   By: Misty Stanley M.D.   On: 06/26/2020 08:05    ASSESSMENT & PLAN:   Primary cancer of right upper lobe of lung ([place]) # Stage IV/recurrent adenocarcinoma of  the lung/ mediastinal lymphadenopathy/liver/  pancreatic head/left adrenal gland/ muscle and skeletal metastasis/Subcutaneous nodule/Brain- on B-RAF V600E- on dabrafenib plus Mekinist. PET scan- MARCH 28th, 2022-near complete response of the lung nodules; liver lesions; adrenal gland lesion; head of pancreas. Bone lesions-more sclerotic  # Tolerating well Dab+Mek with mild to moderate side effects- esp fatigue.   # fatigue-multifactorial -post radiation/TKI.  Check TSH at next visit  # Brain metastases: s/p WBRT; overall improved.  Off steroids; MARCH 2022- MRI brain showed significant improvement of metastatic lesions to the brain.   # Bone lesions-mildly symptomatic; continue Zometa every 4-6 weeks [30 mins; tylenol]. with infusion premedicated with Tylenol at next visit.   # DISPOSITION:  # follow up in 4 weeks; MD- labs- cbc/cmp; Zometa over 30 mins-Dr.B  # I reviewed the blood work- with the patient in detail; also reviewed the imaging independently [as summarized above]; and with the patient in detail.   .     All questions were answered. The patient knows to call the clinic with any problems, questions or concerns.    Cammie Sickle, MD 07/09/2020 3:07 PM

## 2020-08-05 ENCOUNTER — Encounter: Payer: Self-pay | Admitting: Internal Medicine

## 2020-08-05 ENCOUNTER — Inpatient Hospital Stay: Payer: PPO | Admitting: Internal Medicine

## 2020-08-05 ENCOUNTER — Inpatient Hospital Stay: Payer: PPO | Attending: Internal Medicine

## 2020-08-05 ENCOUNTER — Inpatient Hospital Stay: Payer: PPO

## 2020-08-05 DIAGNOSIS — C7951 Secondary malignant neoplasm of bone: Secondary | ICD-10-CM | POA: Insufficient documentation

## 2020-08-05 DIAGNOSIS — C3411 Malignant neoplasm of upper lobe, right bronchus or lung: Secondary | ICD-10-CM

## 2020-08-05 DIAGNOSIS — K5900 Constipation, unspecified: Secondary | ICD-10-CM | POA: Diagnosis not present

## 2020-08-05 DIAGNOSIS — C787 Secondary malignant neoplasm of liver and intrahepatic bile duct: Secondary | ICD-10-CM | POA: Insufficient documentation

## 2020-08-05 DIAGNOSIS — Z79899 Other long term (current) drug therapy: Secondary | ICD-10-CM | POA: Diagnosis not present

## 2020-08-05 DIAGNOSIS — C7931 Secondary malignant neoplasm of brain: Secondary | ICD-10-CM | POA: Diagnosis not present

## 2020-08-05 DIAGNOSIS — C250 Malignant neoplasm of head of pancreas: Secondary | ICD-10-CM | POA: Diagnosis not present

## 2020-08-05 DIAGNOSIS — Z923 Personal history of irradiation: Secondary | ICD-10-CM | POA: Insufficient documentation

## 2020-08-05 LAB — COMPREHENSIVE METABOLIC PANEL
ALT: 20 U/L (ref 0–44)
AST: 30 U/L (ref 15–41)
Albumin: 3.8 g/dL (ref 3.5–5.0)
Alkaline Phosphatase: 50 U/L (ref 38–126)
Anion gap: 9 (ref 5–15)
BUN: 17 mg/dL (ref 8–23)
CO2: 26 mmol/L (ref 22–32)
Calcium: 9.1 mg/dL (ref 8.9–10.3)
Chloride: 101 mmol/L (ref 98–111)
Creatinine, Ser: 1.1 mg/dL (ref 0.61–1.24)
GFR, Estimated: >60 mL/min (ref >60)
Glucose, Bld: 143 mg/dL — ABNORMAL HIGH (ref 70–99)
Potassium: 4.8 mmol/L (ref 3.5–5.1)
Sodium: 136 mmol/L (ref 135–145)
Total Bilirubin: 0.8 mg/dL (ref 0.3–1.2)
Total Protein: 7.2 g/dL (ref 6.5–8.1)

## 2020-08-05 LAB — CBC WITH DIFFERENTIAL/PLATELET
Abs Immature Granulocytes: 0.02 10*3/uL (ref 0.00–0.07)
Basophils Absolute: 0 10*3/uL (ref 0.0–0.1)
Basophils Relative: 1 %
Eosinophils Absolute: 0.2 10*3/uL (ref 0.0–0.5)
Eosinophils Relative: 3 %
HCT: 44.7 % (ref 39.0–52.0)
Hemoglobin: 14.9 g/dL (ref 13.0–17.0)
Immature Granulocytes: 0 %
Lymphocytes Relative: 32 %
Lymphs Abs: 1.7 10*3/uL (ref 0.7–4.0)
MCH: 33.6 pg (ref 26.0–34.0)
MCHC: 33.3 g/dL (ref 30.0–36.0)
MCV: 100.9 fL — ABNORMAL HIGH (ref 80.0–100.0)
Monocytes Absolute: 0.4 10*3/uL (ref 0.1–1.0)
Monocytes Relative: 8 %
Neutro Abs: 3 10*3/uL (ref 1.7–7.7)
Neutrophils Relative %: 56 %
Platelets: 169 10*3/uL (ref 150–400)
RBC: 4.43 MIL/uL (ref 4.22–5.81)
RDW: 14.7 % (ref 11.5–15.5)
WBC: 5.3 10*3/uL (ref 4.0–10.5)
nRBC: 0 % (ref 0.0–0.2)

## 2020-08-05 MED ORDER — SODIUM CHLORIDE 0.9 % IV SOLN
Freq: Once | INTRAVENOUS | Status: AC
Start: 2020-08-05 — End: 2020-08-05
  Filled 2020-08-05: qty 250

## 2020-08-05 MED ORDER — ZOLEDRONIC ACID 4 MG/100ML IV SOLN
4.0000 mg | Freq: Once | INTRAVENOUS | Status: AC
  Administered 2020-08-05: 4 mg via INTRAVENOUS
  Filled 2020-08-05: qty 100

## 2020-08-05 NOTE — Addendum Note (Signed)
Addended by: Delice Bison E on: 08/05/2020 03:45 PM   Modules accepted: Orders

## 2020-08-05 NOTE — Assessment & Plan Note (Signed)
#   Stage IV/recurrent adenocarcinoma of the lung/ mediastinal lymphadenopathy/liver/  pancreatic head/left adrenal gland/ muscle and skeletal metastasis/Subcutaneous nodule/Brain- on B-RAF V600E- on dabrafenib plus Mekinist. PET scan- MARCH 28th, 2022-near complete response of the lung nodules; liver lesions; adrenal gland lesion; head of pancreas. Bone lesions-more sclerotic. STABLE.   # Tolerating well Dab+Mek with mild fatigue.   # fatigue-multifactorial -post radiation/TKI. FEB 2022- TSH-WNL.   # Brain metastases: s/p WBRT; overall improved.  Off steroids; MARCH 2022- MRI brain showed significant improvement of metastatic lesions to the brain. STABLE.   # Bone lesions-mildly symptomatic; continue Zometa every 4-6 weeks [30 mins; tylenol]. with infusion premedicated with Tylenol at next visit.   # Constipation- continue miralax   # DISPOSITION: [mon/-wed appt] # follow up in 6 weeks; MD- labs- cbc/cmp; Zometa over 30 mins-Dr.B   .

## 2020-08-05 NOTE — Progress Notes (Signed)
[place] NOTE  Patient Care Team: [name], MD as PCP - General (Family Medicine) [name], MD as Consulting Physician (Family Medicine) [name], [name], MD (General Surgery) Thornton Park, DO as Consulting Physician (Radiation Oncology)  CHIEF COMPLAINTS/PURPOSE OF CONSULTATION: Lung cancer  #  Oncology History Overview Note  # OCT 2018- STAGE I- A. 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  - PARENCHYMAL MARGIN APPEARS CLEAR; TUMOR WAS 1 CM FROM MARGIN BEFORE REMOVAL OF STAPLE LINE.; NO adjuvant therapy.   # DEC 2021-- METASTATIC CARCINOMA, COMPATIBLE WITH PULMONARY ADENOCARCINOMA [left shoulder Core Biopsy];  NOV 24th PET-right hilar and Hypermetabolic mediastinal lymphadenopathy; small pulmonary nodules; 2 metastatic lesions in the liver/  pancreatic head/left adrenal gland;  muscle and skeletal metastasis.  Subcutaneous nodule-positive for metastatic adeno lung.   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  Short course of Eliquis.smoking-quit 2008.  ---------------------------------------    DIAGNOSIS: LUNG CA  STAGE:  IV      ;GOALS:palliation  CURRENT/MOST RECENT THERAPY: DAB+TREM    Primary cancer of right upper lobe of lung ([place])  04/09/2020 Cancer Staging   Staging form: Lung, AJCC 8th Edition - Clinical: Stage IVB (cN1, cM1c) - Signed by Cammie Sickle, MD on 04/09/2020      HISTORY OF PRESENTING ILLNESS:  Curtis Andrade. 75 y.o.  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  Patient denies any vision changes.  Denies any headaches.  Denies any swelling of the legs.  No nausea no vomiting.  Mild fatigue.  No diarrhea.  No skin rash.  Mild constipation in spite of using MiraLAX.  Review of Systems  Constitutional: Positive for malaise/fatigue. Negative for chills, diaphoresis, fever and weight loss.  HENT: Negative for nosebleeds and sore throat.   Eyes: Negative for double vision.  Respiratory: Negative for cough, hemoptysis, sputum production, shortness of breath and wheezing.   Cardiovascular: Negative for chest pain, palpitations, orthopnea and leg swelling.  Gastrointestinal: Negative for abdominal pain, blood in stool, constipation, diarrhea, heartburn, melena, nausea and vomiting.  Genitourinary: Negative for dysuria, frequency and urgency.  Musculoskeletal: Positive for back pain and joint pain.  Skin: Negative.  Negative for itching and rash.  Neurological: Positive for weakness. Negative for dizziness, tingling, focal weakness and headaches.  Endo/Heme/Allergies: Does not bruise/bleed easily.  Psychiatric/Behavioral: Positive for memory loss. Negative for depression. The patient is not nervous/anxious and does not have insomnia.      MEDICAL HISTORY:  Past Medical History:  Diagnosis Date  . 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 Colon polyp 2013  . Hemorrhoids    pt denies  . 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 Baruch Gouty and Dr. Nadeen Landau  . Phlebitis 1998  . Primary cancer of right upper lobe of lung ([place]) 01/14/2017   Dr. Genevive Bi performed a wedge resection of RUL.   . 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/27/2016    SURGICAL HISTORY: Past Surgical  History:  Procedure Laterality Date  . COLONOSCOPY  2013   Dr Vira Agar  . COLONOSCOPY WITH  PROPOFOL N/A 12/23/2016   Procedure: COLONOSCOPY WITH PROPOFOL;  Surgeon: Robert Bellow, MD;  Location: [facility] ENDOSCOPY;  Service: Endoscopy;  Laterality: N/A;  . EYE SURGERY    . HERNIA REPAIR Right 1990's  . TENDON REPAIR Left 03/14/2019   Procedure: TENDON REPAIR AND GRAFT  TRANSFER EXTENSOR INDICIS PROPRIUS LEFT THUMB;  Surgeon: [name], MD;  Location: [facility];  Service: Orthopedics;  Laterality: Left;  AXILLARY BLOCK  . THORACOTOMY Right 01/14/2017   Procedure: RIGHT THORACOSCOPYWITH WIDE WEDGE RESECTION,  PREOP BROCHOSCOPY;  Surgeon: [name], MD;  Location: [facility] ORS;  Service: General;  Laterality: Right;  . THROAT SURGERY  2010    SOCIAL HISTORY: Social History   Socioeconomic History  . Marital status: Married    Spouse name: Not on file  . Number of children: Not on file  . Years of education: Not on file  . Highest education level: Not on file  Occupational History  . Not on file  Tobacco Use  . Smoking status: Former Smoker    Years: 50.00    Quit date: 03/31/2007    Years since quitting: 13.3  . Smokeless tobacco: Never Used  Vaping Use  . Vaping Use: Never used  Substance and Sexual Activity  . Alcohol use: No  . Drug use: No  . Sexual activity: Yes    Birth control/protection: None    Comment: Married  Other Topics Concern  . Not on file  Social History Narrative  . Not on file   Social Determinants of Health   Financial Resource Strain: Not on file  Food Insecurity: Not on file  Transportation Needs: Not on file  Physical Activity: Not on file  Stress: Not on file  Social Connections: Not on file  Intimate Partner Violence: Not on file    FAMILY HISTORY: Family History  Problem Relation Age of Onset  . Alzheimer's disease Mother   . Alzheimer's disease Father   . Stroke Father   . Colon cancer Neg Hx     ALLERGIES:  has No Known Allergies.  MEDICATIONS:  Current Outpatient Medications  Medication Sig Dispense  Refill  . Ascorbic Acid (VITAMIN C) 1000 MG tablet Take 1,000 mg by mouth daily.    Marland Kitchen aspirin EC 81 MG tablet Take 81 mg by mouth daily.    . Calcium Carbonate-Vit D-Min (CALCIUM 1200 PO) Take 1 tablet by mouth daily.    Marland Kitchen dabrafenib mesylate (TAFINLAR) 75 MG capsule Take 2 capsules (150 mg total) by mouth 2 (two) times daily. Take on an empty stomach 1 hour before or 2 hours after meals. 676 capsule 3  . folic acid (FOLVITE) 195 MCG tablet Take 800 mcg by mouth daily.    . Glucosamine-Chondroitin (COSAMIN DS PO) Take 1 tablet by mouth daily.    . Multiple Vitamin (MULTIVITAMIN WITH MINERALS) TABS tablet Take 1 tablet by mouth daily. Senior Multivitamin.    . niacinamide 500 MG tablet Take 500 mg by mouth 2 (two) times daily with a meal.    . Omega-3 1000 MG CAPS Take 1 tablet by mouth 1 day or 1 dose.    Marland Kitchen omeprazole (PRILOSEC) 40 MG capsule Take 40 mg by mouth daily before supper.     . senna (SENOKOT) 8.6 MG tablet Take 1 tablet by mouth daily.    . trametinib dimethyl sulfoxide (MEKINIST) 2 MG  tablet Take 1 tablet (2 mg total) by mouth daily. Take 1 hour before or 2 hours after a meal. Store refrigerated in original container. 30 tablet 3   No current facility-administered medications for this visit.   Facility-Administered Medications Ordered in Other Visits  Medication Dose Route Frequency Provider Last Rate Last Admin  . 0.9 %  sodium chloride infusion   Intravenous Once Charlaine Dalton R, MD      . Zoledronic Acid (ZOMETA) IVPB 4 mg  4 mg Intravenous Once Cammie Sickle, MD          .  PHYSICAL EXAMINATION: ECOG PERFORMANCE STATUS: 0 - Asymptomatic  Vitals:   08/05/20 1306  BP: (!) 145/72  Pulse: 83  Resp: 16  Temp: (!) 97.4 F (36.3 C)  SpO2: 100%   Filed Weights   08/05/20 1306  Weight: 201 lb (91.2 kg)    Physical Exam Constitutional:      Comments: Ambulating by independently.  Alone.    HENT:     Head: Normocephalic and atraumatic.      Mouth/Throat:     Pharynx: No oropharyngeal exudate.  Eyes:     Pupils: Pupils are equal, round, and reactive to light.  Cardiovascular:     Rate and Rhythm: Normal rate and regular rhythm.  Pulmonary:     Effort: Pulmonary effort is normal. No respiratory distress.     Breath sounds: Normal breath sounds. No wheezing.  Abdominal:     General: Bowel sounds are normal. There is no distension.     Palpations: Abdomen is soft. There is no mass.     Tenderness: There is no abdominal tenderness. There is no guarding or rebound.  Musculoskeletal:        General: No tenderness. Normal range of motion.     Cervical back: Normal range of motion and neck supple.  Skin:    General: Skin is warm.  Neurological:     Mental Status: He is alert and oriented to person, place, and time.  Psychiatric:        Mood and Affect: Affect normal.   ;  LABORATORY DATA:  I have reviewed the data as listed Lab Results  Component Value Date   WBC 5.3 08/05/2020   HGB 14.9 08/05/2020   HCT 44.7 08/05/2020   MCV 100.9 (H) 08/05/2020   PLT 169 08/05/2020   Recent Labs    06/12/20 1324 07/09/20 1254 08/05/20 1257  NA 136 136 136  K 4.5 4.7 4.8  CL 100 101 101  CO2 26 27 26   GLUCOSE 133* 121* 143*  BUN 17 18 17   CREATININE 1.06 0.94 1.10  CALCIUM 8.9 9.0 9.1  GFRNONAA >60 >60 >60  PROT 7.3 6.7 7.2  ALBUMIN 3.7 3.7 3.8  AST 36 28 30  ALT 27 20 20   ALKPHOS 63 49 50  BILITOT 1.1 0.9 0.8    RADIOGRAPHIC STUDIES: I have personally reviewed the radiological images as listed and agreed with the findings in the report. No results found.  ASSESSMENT & PLAN:   Primary cancer of right upper lobe of lung ([place]) # Stage IV/recurrent adenocarcinoma of the lung/ mediastinal lymphadenopathy/liver/  pancreatic head/left adrenal gland/ muscle and skeletal metastasis/Subcutaneous nodule/Brain- on B-RAF V600E- on dabrafenib plus Mekinist. PET scan- MARCH 28th, 2022-near complete response of the lung  nodules; liver lesions; adrenal gland lesion; head of pancreas. Bone lesions-more sclerotic. STABLE.   # Tolerating well Dab+Mek with mild fatigue.   # fatigue-multifactorial -post radiation/TKI.  FEB 2022- TSH-WNL.   # Brain metastases: s/p WBRT; overall improved.  Off steroids; MARCH 2022- MRI brain showed significant improvement of metastatic lesions to the brain. STABLE.   # Bone lesions-mildly symptomatic; continue Zometa every 4-6 weeks [30 mins; tylenol]. with infusion premedicated with Tylenol at next visit.   # Constipation- continue miralax   # DISPOSITION: [mon/-wed appt] # follow up in 6 weeks; MD- labs- cbc/cmp; Zometa over 30 mins-Dr.B   .     All questions were answered. The patient knows to call the clinic with any problems, questions or concerns.    Cammie Sickle, MD 08/05/2020 1:39 PM

## 2020-09-16 ENCOUNTER — Inpatient Hospital Stay: Payer: PPO | Admitting: Internal Medicine

## 2020-09-16 ENCOUNTER — Encounter: Payer: Self-pay | Admitting: Internal Medicine

## 2020-09-16 ENCOUNTER — Inpatient Hospital Stay: Payer: PPO

## 2020-09-16 ENCOUNTER — Inpatient Hospital Stay: Payer: PPO | Attending: Internal Medicine

## 2020-09-16 DIAGNOSIS — C7931 Secondary malignant neoplasm of brain: Secondary | ICD-10-CM | POA: Diagnosis not present

## 2020-09-16 DIAGNOSIS — C250 Malignant neoplasm of head of pancreas: Secondary | ICD-10-CM | POA: Insufficient documentation

## 2020-09-16 DIAGNOSIS — C7951 Secondary malignant neoplasm of bone: Secondary | ICD-10-CM | POA: Diagnosis not present

## 2020-09-16 DIAGNOSIS — C787 Secondary malignant neoplasm of liver and intrahepatic bile duct: Secondary | ICD-10-CM | POA: Insufficient documentation

## 2020-09-16 DIAGNOSIS — C3411 Malignant neoplasm of upper lobe, right bronchus or lung: Secondary | ICD-10-CM

## 2020-09-16 DIAGNOSIS — Z923 Personal history of irradiation: Secondary | ICD-10-CM | POA: Insufficient documentation

## 2020-09-16 DIAGNOSIS — K5900 Constipation, unspecified: Secondary | ICD-10-CM | POA: Insufficient documentation

## 2020-09-16 DIAGNOSIS — Z79899 Other long term (current) drug therapy: Secondary | ICD-10-CM | POA: Diagnosis not present

## 2020-09-16 LAB — COMPREHENSIVE METABOLIC PANEL
ALT: 20 U/L (ref 0–44)
AST: 28 U/L (ref 15–41)
Albumin: 3.6 g/dL (ref 3.5–5.0)
Alkaline Phosphatase: 58 U/L (ref 38–126)
Anion gap: 8 (ref 5–15)
BUN: 19 mg/dL (ref 8–23)
CO2: 29 mmol/L (ref 22–32)
Calcium: 9 mg/dL (ref 8.9–10.3)
Chloride: 98 mmol/L (ref 98–111)
Creatinine, Ser: 1.09 mg/dL (ref 0.61–1.24)
GFR, Estimated: >60 mL/min (ref >60)
Glucose, Bld: 116 mg/dL — ABNORMAL HIGH (ref 70–99)
Potassium: 5 mmol/L (ref 3.5–5.1)
Sodium: 135 mmol/L (ref 135–145)
Total Bilirubin: 0.9 mg/dL (ref 0.3–1.2)
Total Protein: 6.7 g/dL (ref 6.5–8.1)

## 2020-09-16 LAB — CBC WITH DIFFERENTIAL/PLATELET
Abs Immature Granulocytes: 0.02 10*3/uL (ref 0.00–0.07)
Basophils Absolute: 0.1 10*3/uL (ref 0.0–0.1)
Basophils Relative: 1 %
Eosinophils Absolute: 0.2 10*3/uL (ref 0.0–0.5)
Eosinophils Relative: 3 %
HCT: 43.1 % (ref 39.0–52.0)
Hemoglobin: 14.4 g/dL (ref 13.0–17.0)
Immature Granulocytes: 0 %
Lymphocytes Relative: 25 %
Lymphs Abs: 1.7 10*3/uL (ref 0.7–4.0)
MCH: 33.6 pg (ref 26.0–34.0)
MCHC: 33.4 g/dL (ref 30.0–36.0)
MCV: 100.5 fL — ABNORMAL HIGH (ref 80.0–100.0)
Monocytes Absolute: 0.6 10*3/uL (ref 0.1–1.0)
Monocytes Relative: 9 %
Neutro Abs: 4.1 10*3/uL (ref 1.7–7.7)
Neutrophils Relative %: 62 %
Platelets: 179 10*3/uL (ref 150–400)
RBC: 4.29 MIL/uL (ref 4.22–5.81)
RDW: 14.1 % (ref 11.5–15.5)
WBC: 6.7 10*3/uL (ref 4.0–10.5)
nRBC: 0 % (ref 0.0–0.2)

## 2020-09-16 MED ORDER — SODIUM CHLORIDE 0.9 % IV SOLN
Freq: Once | INTRAVENOUS | Status: AC
Start: 2020-09-16 — End: 2020-09-16
  Filled 2020-09-16: qty 250

## 2020-09-16 MED ORDER — ZOLEDRONIC ACID 4 MG/100ML IV SOLN
4.0000 mg | Freq: Once | INTRAVENOUS | Status: AC
  Administered 2020-09-16: 4 mg via INTRAVENOUS
  Filled 2020-09-16: qty 100

## 2020-09-16 NOTE — Assessment & Plan Note (Addendum)
#   Stage IV/recurrent adenocarcinoma of the lung/ mediastinal lymphadenopathy/liver/  pancreatic head/left adrenal gland/ muscle and skeletal metastasis/Subcutaneous nodule/Brain- on B-RAF V600E- on dabrafenib plus Mekinist. PET scan- MARCH 28th, 2022-near complete response of the lung nodules; liver lesions; adrenal gland lesion; head of pancreas. Bone lesions-more sclerotic. STABLE.   # Tolerating well Dab+Mek except for mild fatigue.  No rash.  No fevers.  # fatigue-multifactorial -post radiation/TKI. FEB 2022- TSH-WNL.  We will plan imaging prior to next visit.  # Brain metastases: s/p WBRT; overall improved.  Off steroids; MARCH 2022- MRI brain showed significant improvement of metastatic lesions to the brain.  Stable  # Bone lesions-mildly symptomatic; continue Zometa every 4-6 weeks [30 mins; tylenol]. with infusion premedicated with Tylenol at home.   # Constipation- continue miralax prn; stable  # DISPOSITION: [mon/-wed appt] # follow up in 6 weeks; MD- labs- cbc/cmp; Zometa over 30 mins; CT Chest/A/P- prior-Dr.B.

## 2020-09-16 NOTE — Progress Notes (Signed)
[name] NOTE  Patient Care Team: [name], MD as PCP - General (Family Medicine) [name], MD as Consulting Physician (Family Medicine) [name], [name], MD (General Surgery) Thornton Park, DO as Consulting Physician (Radiation Oncology)  CHIEF COMPLAINTS/PURPOSE OF CONSULTATION: Lung cancer  #  Oncology History Overview Note  # OCT 2018- STAGE I- A. 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  - PARENCHYMAL MARGIN APPEARS CLEAR; TUMOR WAS 1 CM FROM MARGIN BEFORE REMOVAL OF STAPLE LINE.; NO adjuvant therapy.   # DEC 2021-- METASTATIC CARCINOMA, COMPATIBLE WITH PULMONARY ADENOCARCINOMA [left shoulder Core Biopsy];  NOV 24th PET-right hilar and Hypermetabolic mediastinal lymphadenopathy; small pulmonary nodules; 2 metastatic lesions in the liver/  pancreatic head/left adrenal gland;  muscle and skeletal metastasis.  Subcutaneous nodule-positive for metastatic adeno lung.   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  Short course of Eliquis.smoking-quit 2008.  ---------------------------------------    DIAGNOSIS: LUNG CA  STAGE:  IV      ;GOALS:palliation  CURRENT/MOST RECENT THERAPY: DAB+TREM    Primary cancer of right upper lobe of lung ([place])  04/09/2020 Cancer Staging   Staging form: Lung, AJCC 8th Edition - Clinical: Stage IVB (cN1, cM1c) - Signed by Cammie Sickle, MD on 04/09/2020       HISTORY OF PRESENTING ILLNESS:  Curtis Andrade. 75 y.o.  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  Patient complains of mild fatigue.  Otherwise denies any chest pain or shortness of breath cough.  Denies any new lumps or bumps.  No skin rash.  No fevers.  No diarrhea.  Mild constipation.  Review of Systems  Constitutional:  Positive for malaise/fatigue. Negative for chills, diaphoresis, fever and weight loss.  HENT:  Negative for nosebleeds and sore throat.   Eyes:  Negative for double vision.  Respiratory:  Negative for cough, hemoptysis, sputum production, shortness of breath and wheezing.   Cardiovascular:  Negative for chest pain, palpitations, orthopnea and leg swelling.  Gastrointestinal:  Negative for abdominal pain, blood in stool, constipation, diarrhea, heartburn, melena, nausea and vomiting.  Genitourinary:  Negative for dysuria, frequency and urgency.  Musculoskeletal:  Positive for back pain and joint pain.  Skin: Negative.  Negative for itching and rash.  Neurological:  Positive for weakness. Negative for dizziness, tingling, focal weakness and headaches.  Endo/Heme/Allergies:  Does not bruise/bleed easily.  Psychiatric/Behavioral:  Positive for memory loss. Negative for depression. The patient is not nervous/anxious and does not have insomnia.     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 Baruch Gouty and Dr. Nadeen Landau   Phlebitis 1998   Primary cancer of right upper lobe of lung ([place]) 01/14/2017   Dr. Genevive Bi performed a wedge resection of RUL.    Psoriasis, unspecified  11/27/2016    SURGICAL HISTORY: Past Surgical History:  Procedure Laterality Date   COLONOSCOPY  2013   Dr Vira Agar   COLONOSCOPY WITH PROPOFOL N/A 12/23/2016    Procedure: COLONOSCOPY WITH PROPOFOL;  Surgeon: Robert Bellow, MD;  Location: [facility] ENDOSCOPY;  Service: Endoscopy;  Laterality: N/A;   EYE SURGERY     HERNIA REPAIR Right 1990's   TENDON REPAIR Left 03/14/2019   Procedure: TENDON REPAIR AND GRAFT  TRANSFER EXTENSOR INDICIS PROPRIUS LEFT THUMB;  Surgeon: [name], MD;  Location: [facility];  Service: Orthopedics;  Laterality: Left;  AXILLARY BLOCK   THORACOTOMY Right 01/14/2017   Procedure: RIGHT THORACOSCOPYWITH WIDE WEDGE RESECTION,  PREOP BROCHOSCOPY;  Surgeon: [name], MD;  Location: [facility] ORS;  Service: General;  Laterality: Right;   THROAT SURGERY  2010    SOCIAL HISTORY: Social History   Socioeconomic History   Marital status: Married    Spouse name: Not on file   Number of children: Not on file   Years of education: Not on file   Highest education level: Not on file  Occupational History   Not on file  Tobacco Use   Smoking status: Former    Years: 50.00    Pack years: 0.00    Types: Cigarettes    Quit date: 03/31/2007    Years since quitting: 13.4   Smokeless tobacco: Never  Vaping Use   Vaping Use: Never used  Substance and Sexual Activity   Alcohol use: No   Drug use: No   Sexual activity: Yes    Birth control/protection: None    Comment: Married  Other Topics Concern   Not on file  Social History Narrative   Not on file   Social Determinants of Health   Financial Resource Strain: Not on file  Food Insecurity: Not on file  Transportation Needs: Not on file  Physical Activity: Not on file  Stress: Not on file  Social Connections: Not on file  Intimate Partner Violence: Not on file    FAMILY HISTORY: Family History  Problem Relation Age of Onset   Alzheimer's disease Mother    Alzheimer's disease Father    Stroke Father    Colon cancer Neg Hx     ALLERGIES:  has No Known Allergies.  MEDICATIONS:  Current Outpatient Medications  Medication Sig Dispense Refill    Ascorbic Acid (VITAMIN C) 1000 MG tablet Take 1,000 mg by mouth daily.     aspirin EC 81 MG tablet Take 81 mg by mouth daily.     Calcium Carbonate-Vit D-Min (CALCIUM 1200 PO) Take 1 tablet by mouth daily.     dabrafenib mesylate (TAFINLAR) 75 MG capsule Take 2 capsules (150 mg total) by mouth 2 (two) times daily. Take on an empty stomach 1 hour before or 2 hours after meals. 144 capsule 3   folic acid (FOLVITE) 315 MCG tablet Take 800 mcg by mouth daily.     Glucosamine-Chondroitin (COSAMIN DS PO) Take 1 tablet by mouth daily.     Multiple Vitamin (MULTIVITAMIN WITH MINERALS) TABS tablet Take 1 tablet by mouth daily. Senior Multivitamin.     niacinamide 500 MG tablet Take 500 mg by mouth 2 (two) times daily with a meal.     Omega-3 1000 MG CAPS Take 1 tablet by mouth 1 day or 1 dose.     omeprazole (PRILOSEC) 40 MG capsule Take 40 mg by mouth daily before supper.      senna (SENOKOT) 8.6 MG  tablet Take 1 tablet by mouth daily. (Patient not taking: Reported on 09/16/2020)     trametinib dimethyl sulfoxide (MEKINIST) 2 MG tablet Take 1 tablet (2 mg total) by mouth daily. Take 1 hour before or 2 hours after a meal. Store refrigerated in original container. 30 tablet 3   No current facility-administered medications for this visit.      Marland Kitchen  PHYSICAL EXAMINATION: ECOG PERFORMANCE STATUS: 0 - Asymptomatic  Vitals:   09/16/20 1423  BP: (!) 158/85  Pulse: 79  Resp: 20  Temp: 97.8 F (36.6 C)  SpO2: 100%   Filed Weights   09/16/20 1423  Weight: 200 lb 12.8 oz (91.1 kg)    Physical Exam Constitutional:      Comments: Ambulating by independently.  Alone.    HENT:     Head: Normocephalic and atraumatic.     Mouth/Throat:     Pharynx: No oropharyngeal exudate.  Eyes:     Pupils: Pupils are equal, round, and reactive to light.  Cardiovascular:     Rate and Rhythm: Normal rate and regular rhythm.  Pulmonary:     Effort: Pulmonary effort is normal. No respiratory distress.     Breath  sounds: Normal breath sounds. No wheezing.  Abdominal:     General: Bowel sounds are normal. There is no distension.     Palpations: Abdomen is soft. There is no mass.     Tenderness: no abdominal tenderness There is no guarding or rebound.  Musculoskeletal:        General: No tenderness. Normal range of motion.     Cervical back: Normal range of motion and neck supple.  Skin:    General: Skin is warm.  Neurological:     Mental Status: He is alert and oriented to person, place, and time.  Psychiatric:        Mood and Affect: Affect normal.  ;  LABORATORY DATA:  I have reviewed the data as listed Lab Results  Component Value Date   WBC 6.7 09/16/2020   HGB 14.4 09/16/2020   HCT 43.1 09/16/2020   MCV 100.5 (H) 09/16/2020   PLT 179 09/16/2020   Recent Labs    07/09/20 1254 08/05/20 1257 09/16/20 1340  NA 136 136 135  K 4.7 4.8 5.0  CL 101 101 98  CO2 27 26 29   GLUCOSE 121* 143* 116*  BUN 18 17 19   CREATININE 0.94 1.10 1.09  CALCIUM 9.0 9.1 9.0  GFRNONAA >60 >60 >60  PROT 6.7 7.2 6.7  ALBUMIN 3.7 3.8 3.6  AST 28 30 28   ALT 20 20 20   ALKPHOS 49 50 58  BILITOT 0.9 0.8 0.9    RADIOGRAPHIC STUDIES: I have personally reviewed the radiological images as listed and agreed with the findings in the report. No results found.  ASSESSMENT & PLAN:   Primary cancer of right upper lobe of lung ([place]) # Stage IV/recurrent adenocarcinoma of the lung/ mediastinal lymphadenopathy/liver/  pancreatic head/left adrenal gland/ muscle and skeletal metastasis/Subcutaneous nodule/Brain- on B-RAF V600E- on dabrafenib plus Mekinist. PET scan- MARCH 28th, 2022-near complete response of the lung nodules; liver lesions; adrenal gland lesion; head of pancreas. Bone lesions-more sclerotic. STABLE.   # Tolerating well Dab+Mek except for mild fatigue.  No rash.  No fevers.  # fatigue-multifactorial -post radiation/TKI. FEB 2022- TSH-WNL.  We will plan imaging prior to next visit.  # Brain  metastases: s/p WBRT; overall improved.  Off steroids; MARCH 2022- MRI brain showed significant improvement of metastatic  lesions to the brain.  Stable  # Bone lesions-mildly symptomatic; continue Zometa every 4-6 weeks [30 mins; tylenol]. with infusion premedicated with Tylenol at home.   # Constipation- continue miralax prn; stable  # DISPOSITION: [mon/-wed appt] # follow up in 6 weeks; MD- labs- cbc/cmp; Zometa over 30 mins; CT Chest/A/P- prior-Dr.B.   All questions were answered. The patient knows to call the clinic with any problems, questions or concerns.    Cammie Sickle, MD 09/22/2020 9:38 PM

## 2020-09-22 ENCOUNTER — Encounter: Payer: Self-pay | Admitting: Internal Medicine

## 2020-10-07 DIAGNOSIS — D2271 Melanocytic nevi of right lower limb, including hip: Secondary | ICD-10-CM | POA: Diagnosis not present

## 2020-10-07 DIAGNOSIS — L821 Other seborrheic keratosis: Secondary | ICD-10-CM | POA: Diagnosis not present

## 2020-10-07 DIAGNOSIS — D2261 Melanocytic nevi of right upper limb, including shoulder: Secondary | ICD-10-CM | POA: Diagnosis not present

## 2020-10-07 DIAGNOSIS — L570 Actinic keratosis: Secondary | ICD-10-CM | POA: Diagnosis not present

## 2020-10-07 DIAGNOSIS — Z85828 Personal history of other malignant neoplasm of skin: Secondary | ICD-10-CM | POA: Diagnosis not present

## 2020-10-07 DIAGNOSIS — D2262 Melanocytic nevi of left upper limb, including shoulder: Secondary | ICD-10-CM | POA: Diagnosis not present

## 2020-10-11 ENCOUNTER — Telehealth: Payer: Self-pay | Admitting: *Deleted

## 2020-10-11 DIAGNOSIS — C3411 Malignant neoplasm of upper lobe, right bronchus or lung: Secondary | ICD-10-CM

## 2020-10-11 MED ORDER — DABRAFENIB MESYLATE 75 MG PO CAPS: 150.0000 mg | ORAL_CAPSULE | Freq: Two times a day (BID) | ORAL | 3 refills | Status: DC

## 2020-10-11 MED ORDER — TRAMETINIB DIMETHYL SULFOXIDE 2 MG PO TABS: 2.0000 mg | ORAL_TABLET | Freq: Every day | ORAL | 3 refills | Status: DC

## 2020-10-11 NOTE — Telephone Encounter (Signed)
RF request submitted

## 2020-10-22 ENCOUNTER — Telehealth: Payer: Self-pay | Admitting: *Deleted

## 2020-10-22 ENCOUNTER — Telehealth: Payer: Self-pay | Admitting: Internal Medicine

## 2020-10-22 ENCOUNTER — Inpatient Hospital Stay: Payer: PPO | Attending: Hospice and Palliative Medicine | Admitting: Hospice and Palliative Medicine

## 2020-10-22 ENCOUNTER — Encounter: Payer: Self-pay | Admitting: Hospice and Palliative Medicine

## 2020-10-22 ENCOUNTER — Other Ambulatory Visit: Payer: Self-pay

## 2020-10-22 DIAGNOSIS — U071 COVID-19: Secondary | ICD-10-CM | POA: Diagnosis not present

## 2020-10-22 DIAGNOSIS — C7951 Secondary malignant neoplasm of bone: Secondary | ICD-10-CM

## 2020-10-22 MED ORDER — NIRMATRELVIR/RITONAVIR (PAXLOVID)TABLET: 3.0000 | ORAL_TABLET | Freq: Two times a day (BID) | ORAL | 0 refills | Status: AC

## 2020-10-22 NOTE — Progress Notes (Signed)
Virtual Visit via Video Note  I connected with Curtis Andrade. on 10/22/20 at  1:00 PM EDT by a video enabled telemedicine application and verified that I am speaking with the correct person using two identifiers.  Location: Patient: Home Provider: Clinic   I discussed the limitations of evaluation and management by telemedicine and the availability of in person appointments. The patient expressed understanding and agreed to proceed.  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    Observations/Objective: Spoke with patient and his wife.  Patient started having fatigue and malaise yesterday with runny nose/productive cough today.  He denies shortness of breath, fever, chills, myalgias, sinus pain/pressure, or sore throat.  No nausea, vomiting, or diarrhea.  Patient reports that he feels slightly better today than he did yesterday.  Patient is fully vaccinated and last received a booster in October 2021.  Assessment and Plan: Stage IV recurrent adenocarcinoma of the lung -patient is pending CT scan tomorrow and then had planned follow-up with Dr. Rogue Bussing next week.  Given COVID + will need to reschedule appointments.  Message sent to schedulers.  COVID -patient is high risk due to chemotherapy/active treatment.  Will start treatment with Paxlovid. Renal function has been stable. Discussed with Dr. Rogue Bussing and Nuala Alpha, PharmD. Medications reviewed for interaction. Patient instructed to hold Dab+Mek while taking Paxlovid. He took both Dab/Mek today, so he was instructed to start Paxlovid tomorrow.  Discussed symptomatic care for COVID including OTC antitussive/guaifenesin.  Discussed quarantining  parameters.  Encouraged fluids and rest.  Recommended monitoring SaO2.  Discussed triggers for urgent care/ER evaluation including worsening symptoms or shortness of breath  Follow Up Instructions: As needed   I discussed the assessment and treatment plan with the patient. The patient was provided an opportunity to ask questions and all were answered. The patient agreed with the plan and demonstrated an understanding of the instructions.   The patient was advised to call back or seek an in-person evaluation if the symptoms worsen or if the condition fails to improve as anticipated.  I provided 15 minutes of non-face-to-face time during this encounter.   Irean Hong, NP

## 2020-10-22 NOTE — Telephone Encounter (Signed)
+   covid- please advise.

## 2020-10-22 NOTE — Telephone Encounter (Signed)
Patient tested positive for COVID this morning, needs to reschedule imaging and follow up appointments.  Routing to team for follow up.

## 2020-10-22 NOTE — Telephone Encounter (Signed)
Mychart Apt set up for Curtis Andrade- NP at 1pm today to discuss covid-19 +  Alyson/Curtis Andrade/Dr B - pt is still taking his oral chemo pills-last taken this morning.

## 2020-10-22 NOTE — Telephone Encounter (Addendum)
Patient called reporting that he awoke this morning feeling rough, congested, coughing and tested for COVID and is positive. He has a CT scheduled for tomorrow and follow up appointment Monday with physician. I transferred patient to scheduling to reschedule his appointments. Will he need further treatment for his COVID?

## 2020-10-22 NOTE — Telephone Encounter (Signed)
Curtis Andrade -see if patient needs virtual visit with NP

## 2020-10-22 NOTE — Telephone Encounter (Signed)
Pt also called triage - earlier (see phone note). Waiting NP's to determine if he needs tx for covid.

## 2020-10-23 ENCOUNTER — Ambulatory Visit: Admission: RE | Admit: 2020-10-23 | Payer: PPO | Source: Ambulatory Visit

## 2020-10-28 ENCOUNTER — Inpatient Hospital Stay: Payer: PPO

## 2020-10-28 ENCOUNTER — Inpatient Hospital Stay: Payer: PPO | Admitting: Internal Medicine

## 2020-11-08 ENCOUNTER — Ambulatory Visit
Admission: RE | Admit: 2020-11-08 | Discharge: 2020-11-08 | Disposition: A | Discharge status: Home / Self Care | Payer: PPO | Source: Ambulatory Visit | Attending: Internal Medicine | Admitting: Internal Medicine

## 2020-11-08 ENCOUNTER — Other Ambulatory Visit: Payer: Self-pay

## 2020-11-08 ENCOUNTER — Other Ambulatory Visit: Payer: Self-pay | Admitting: *Deleted

## 2020-11-08 DIAGNOSIS — C3411 Malignant neoplasm of upper lobe, right bronchus or lung: Secondary | ICD-10-CM | POA: Insufficient documentation

## 2020-11-08 DIAGNOSIS — C7951 Secondary malignant neoplasm of bone: Secondary | ICD-10-CM

## 2020-11-08 DIAGNOSIS — N2889 Other specified disorders of kidney and ureter: Secondary | ICD-10-CM | POA: Diagnosis not present

## 2020-11-08 DIAGNOSIS — K8020 Calculus of gallbladder without cholecystitis without obstruction: Secondary | ICD-10-CM | POA: Diagnosis not present

## 2020-11-08 DIAGNOSIS — C3490 Malignant neoplasm of unspecified part of unspecified bronchus or lung: Secondary | ICD-10-CM | POA: Diagnosis not present

## 2020-11-08 DIAGNOSIS — I2510 Atherosclerotic heart disease of native coronary artery without angina pectoris: Secondary | ICD-10-CM | POA: Diagnosis not present

## 2020-11-08 DIAGNOSIS — G35 Multiple sclerosis: Secondary | ICD-10-CM | POA: Diagnosis not present

## 2020-11-08 DIAGNOSIS — K7689 Other specified diseases of liver: Secondary | ICD-10-CM | POA: Diagnosis not present

## 2020-11-08 LAB — POCT I-STAT CREATININE: Creatinine, Ser: 0.9 mg/dL (ref 0.61–1.24)

## 2020-11-08 IMAGING — CT CT CHEST-ABD-PELV W/ CM
3 of 6 series · 9 of 36 positions shown, 15 images · IV contrast (omnipaque)
Comparison: PET-CT [DATE], CT [DATE]

CLINICAL DATA: Non-small cell lung cancer. Assess treatment
response.

EXAM:
CT CHEST, ABDOMEN, AND PELVIS WITH CONTRAST
TECHNIQUE: Multidetector CT imaging of the chest, abdomen and pelvis was
performed following the standard protocol during bolus
administration of intravenous contrast.
CONTRAST:  100mL OMNIPAQUE IOHEXOL 350 MG/ML SOLN

[Series 3: bone windows cap 2.00 · axial · 0.77mm/px · z∈[-1541,-1429]mm · 2 of 366 slices shown]
[im 29/366  bone]
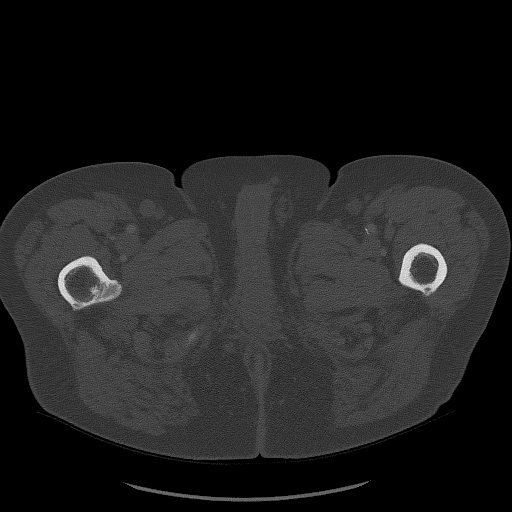
[im 85/366  bone]
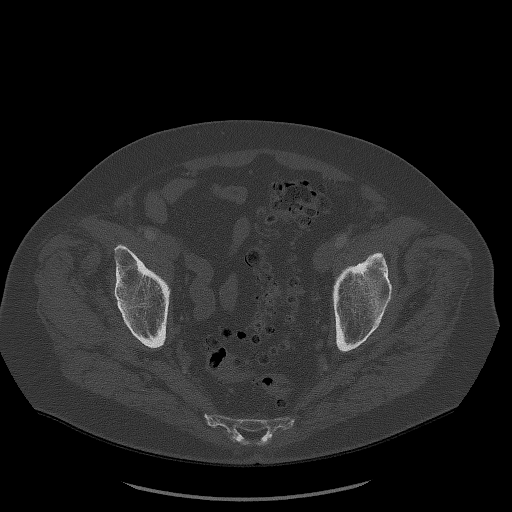

[Series 4: axials cap 5.00 · axial · 0.78mm/px · z∈[-1449,-1014]mm · 4 of 146 slices shown, 9 images]
[im 30/146  mediastinal]
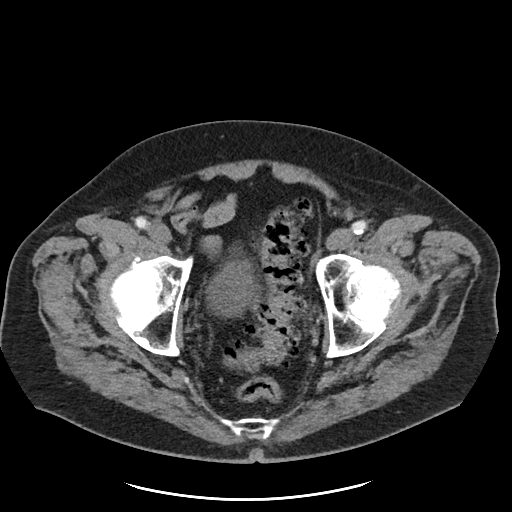
[im 30/146  lung]
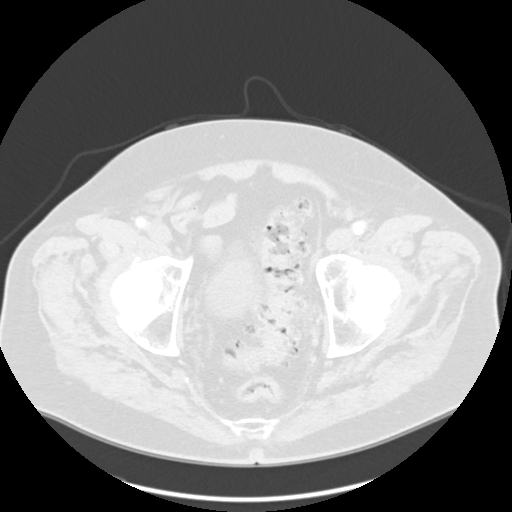
[im 30/146  bone]
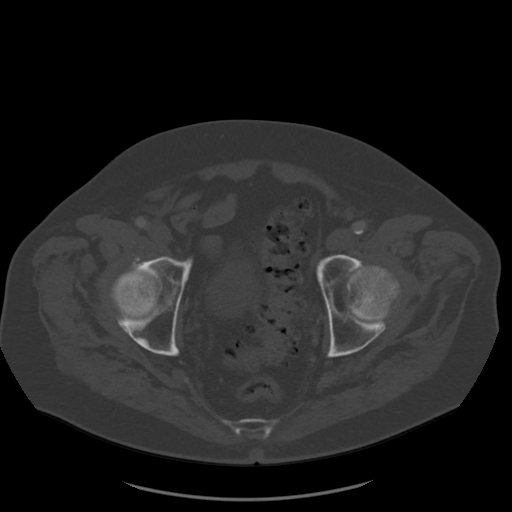
[im 59/146  mediastinal]
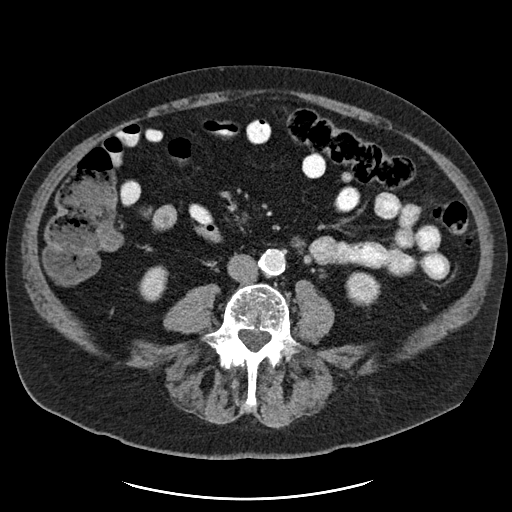
[im 59/146  lung]
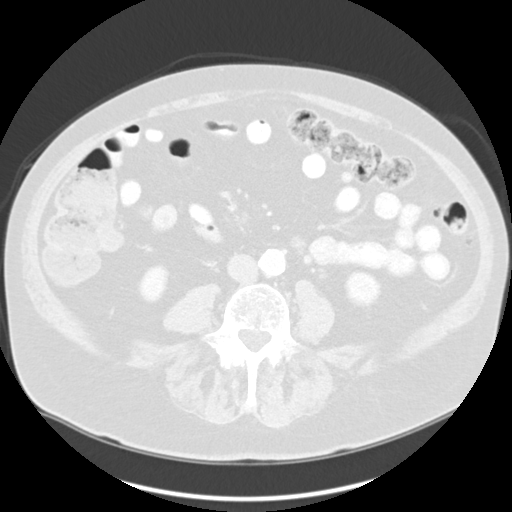
[im 88/146  mediastinal]
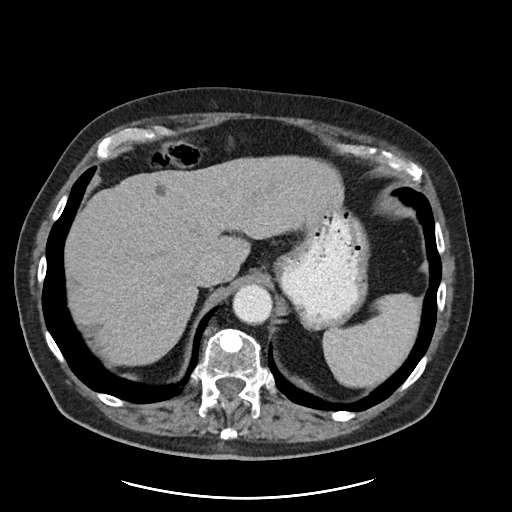
[im 88/146  lung]
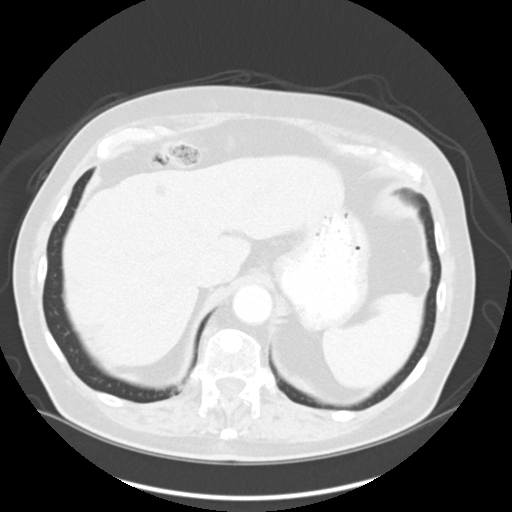
[im 117/146  mediastinal]
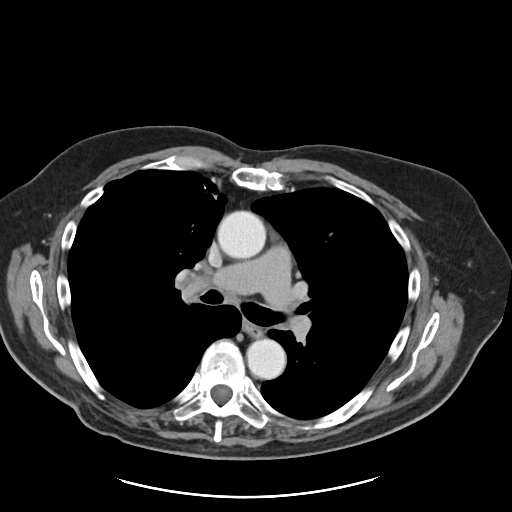
[im 117/146  lung]
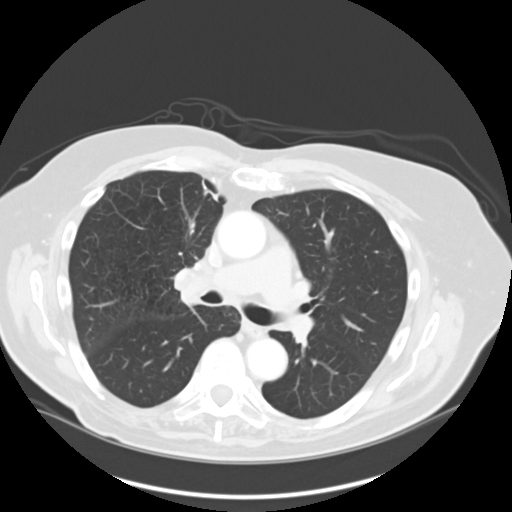

[Series 5: coronals cap 2.00 cor · coronal · 0.80mm/px · 3 of 157 slices shown, 4 images]
[im 32/157  mediastinal]
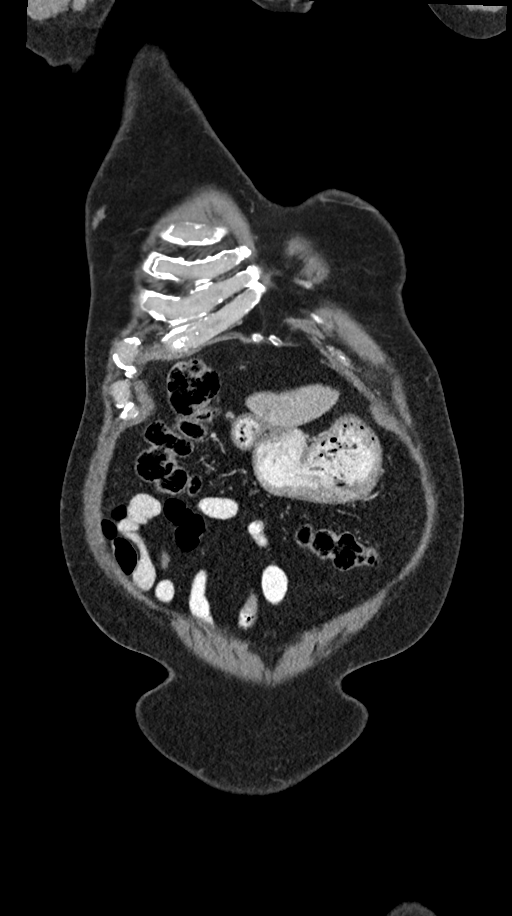
[im 63/157  mediastinal]
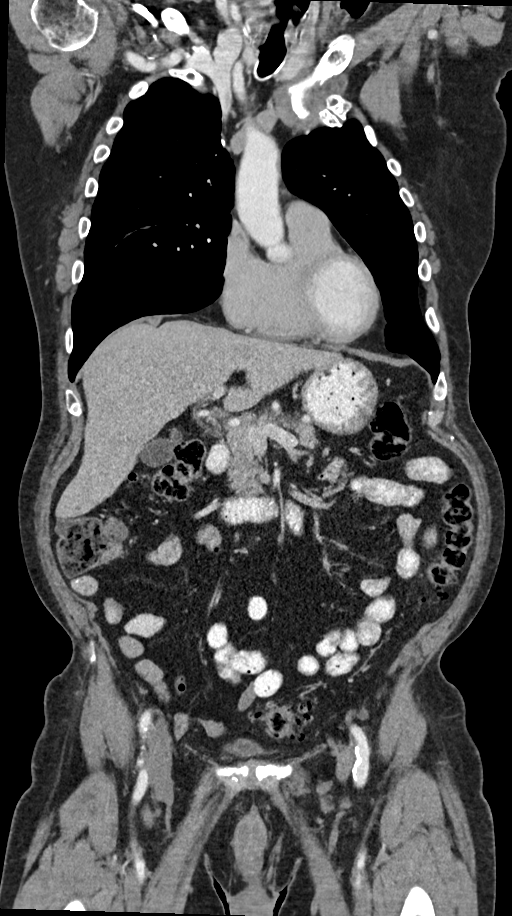
[im 63/157  bone]
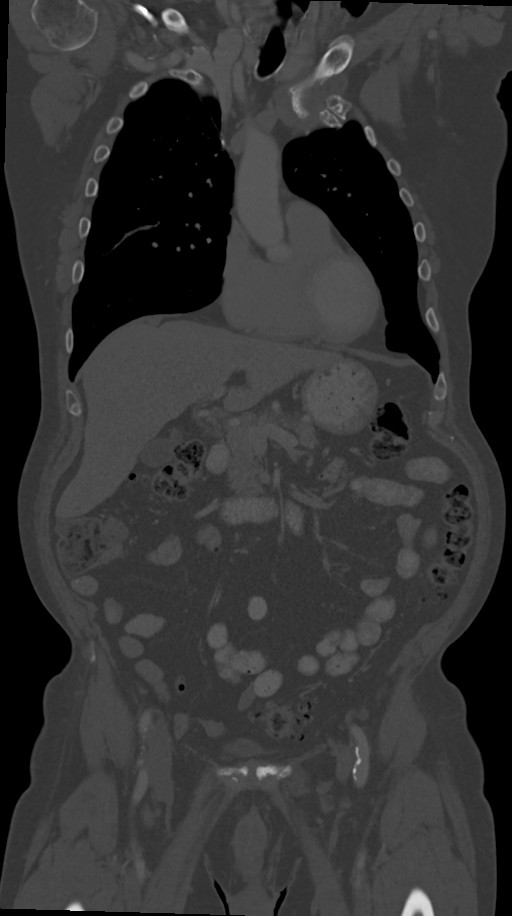
[im 94/157  mediastinal]
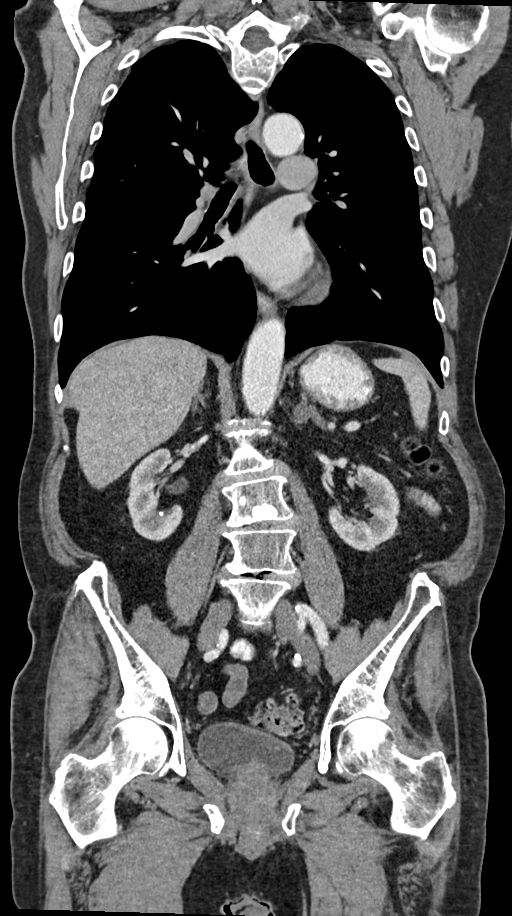

[9 of 36 positions shown; findings below may reference images not displayed]

FINDINGS: CT CHEST FINDINGS

Cardiovascular: Coronary artery calcification and aortic
atherosclerotic calcification.

Mediastinum/Nodes: No axillary supraclavicular adenopathy. No
mediastinal adenopathy. RIGHT hilar node measures 13 mm ([DATE])
decreased from 20 mm on CT [FP]. Difficult to compare on noncontrast
PET-CT scans. No metabolic activity on recent PET-CT scan.

Normal esophagus.

Lungs/Pleura: Several small nodules in the RIGHT lung again
demonstrated not changed from prior CT exam PET-CT exam. Example 3
mm nodule in the RIGHT middle lobe (image 85/2) compares to 3 mm on
CT [DATE]. Middle lobe nodule measuring 5 mm (image 96) compares
with 5 mm. No new nodules are present.

Musculoskeletal: Multiple round sclerotic lesions in the spine not
changed from comparison PET-CT scan. Example lesion at T12 measuring
16 mm compares to 15 mm.

CT ABDOMEN AND PELVIS FINDINGS

Hepatobiliary: Low-density lesion in the central liver has simple
fluid attenuation. Previously seen metastatic lesions are not
demonstrated. Small gallstone noted

Pancreas: Pancreas is normal. No ductal dilatation. No pancreatic
inflammation.

Spleen: Normal spleen

Adrenals/urinary tract: Thickening LEFT adrenal gland is unchanged.
Kidneys, ureters and bladder normal. Small calcification within the
RIGHT kidney.

Stomach/Bowel: Stomach, small bowel, appendix, and cecum are
normal. Multiple diverticula of the descending colon and sigmoid
colon without acute inflammation.

Vascular/Lymphatic: Abdominal aorta is normal caliber. There is no
retroperitoneal or periportal lymphadenopathy. No pelvic
lymphadenopathy.

Reproductive: Prostate normal

Other: No free fluid.

Musculoskeletal: Multiple small round sclerotic lesions in the
pelvis and spine are unchanged. For example rounded lumbar spine
lesion measuring 17 mm ( 83/4) compares to 18 mm.
IMPRESSION: Chest Impression:

1. No evidence of lung cancer recurrence or progression.
2. Stable small nodules in the RIGHT middle lobe favored benign.
Recommend continued attention on follow-up.
3. Stable enlarged RIGHT hilar lymph node is not metabolic on
comparison FDG PET exam

Abdomen / Pelvis Impression:

1. Hepatic metastasis are not identified.
2. No adenopathy in the abdomen pelvis.
3. Stable round sclerotic metastatic lesions in the pelvis and
spine.

## 2020-11-08 MED ORDER — IOHEXOL 350 MG/ML SOLN
100.0000 mL | Freq: Once | INTRAVENOUS | Status: AC | PRN
  Administered 2020-11-08: 100 mL via INTRAVENOUS

## 2020-11-11 ENCOUNTER — Inpatient Hospital Stay: Payer: PPO | Admitting: Internal Medicine

## 2020-11-11 ENCOUNTER — Inpatient Hospital Stay: Payer: PPO | Attending: Internal Medicine

## 2020-11-11 ENCOUNTER — Encounter: Payer: Self-pay | Admitting: Internal Medicine

## 2020-11-11 ENCOUNTER — Inpatient Hospital Stay: Payer: PPO

## 2020-11-11 VITALS — BP 155/79 | HR 65 | Temp 97.1°F | Resp 16 | Ht 71.0 in | Wt 202.0 lb

## 2020-11-11 DIAGNOSIS — C787 Secondary malignant neoplasm of liver and intrahepatic bile duct: Secondary | ICD-10-CM | POA: Diagnosis not present

## 2020-11-11 DIAGNOSIS — C7931 Secondary malignant neoplasm of brain: Secondary | ICD-10-CM | POA: Insufficient documentation

## 2020-11-11 DIAGNOSIS — Z923 Personal history of irradiation: Secondary | ICD-10-CM | POA: Insufficient documentation

## 2020-11-11 DIAGNOSIS — C3411 Malignant neoplasm of upper lobe, right bronchus or lung: Secondary | ICD-10-CM | POA: Diagnosis not present

## 2020-11-11 DIAGNOSIS — C7951 Secondary malignant neoplasm of bone: Secondary | ICD-10-CM | POA: Insufficient documentation

## 2020-11-11 DIAGNOSIS — Z8616 Personal history of COVID-19: Secondary | ICD-10-CM | POA: Diagnosis not present

## 2020-11-11 DIAGNOSIS — Z8521 Personal history of malignant neoplasm of larynx: Secondary | ICD-10-CM | POA: Diagnosis not present

## 2020-11-11 LAB — CBC WITH DIFFERENTIAL/PLATELET
Abs Immature Granulocytes: 0.01 10*3/uL (ref 0.00–0.07)
Basophils Absolute: 0 10*3/uL (ref 0.0–0.1)
Basophils Relative: 1 %
Eosinophils Absolute: 0.2 10*3/uL (ref 0.0–0.5)
Eosinophils Relative: 3 %
HCT: 41.7 % (ref 39.0–52.0)
Hemoglobin: 13.9 g/dL (ref 13.0–17.0)
Immature Granulocytes: 0 %
Lymphocytes Relative: 22 %
Lymphs Abs: 1.3 10*3/uL (ref 0.7–4.0)
MCH: 33.7 pg (ref 26.0–34.0)
MCHC: 33.3 g/dL (ref 30.0–36.0)
MCV: 101.2 fL — ABNORMAL HIGH (ref 80.0–100.0)
Monocytes Absolute: 0.7 10*3/uL (ref 0.1–1.0)
Monocytes Relative: 11 %
Neutro Abs: 3.7 10*3/uL (ref 1.7–7.7)
Neutrophils Relative %: 63 %
Platelets: 179 10*3/uL (ref 150–400)
RBC: 4.12 MIL/uL — ABNORMAL LOW (ref 4.22–5.81)
RDW: 14.9 % (ref 11.5–15.5)
WBC: 5.8 10*3/uL (ref 4.0–10.5)
nRBC: 0 % (ref 0.0–0.2)

## 2020-11-11 LAB — COMPREHENSIVE METABOLIC PANEL
ALT: 24 U/L (ref 0–44)
AST: 32 U/L (ref 15–41)
Albumin: 3.6 g/dL (ref 3.5–5.0)
Alkaline Phosphatase: 55 U/L (ref 38–126)
Anion gap: 8 (ref 5–15)
BUN: 13 mg/dL (ref 8–23)
CO2: 28 mmol/L (ref 22–32)
Calcium: 9 mg/dL (ref 8.9–10.3)
Chloride: 100 mmol/L (ref 98–111)
Creatinine, Ser: 0.94 mg/dL (ref 0.61–1.24)
GFR, Estimated: >60 mL/min (ref >60)
Glucose, Bld: 120 mg/dL — ABNORMAL HIGH (ref 70–99)
Potassium: 4.1 mmol/L (ref 3.5–5.1)
Sodium: 136 mmol/L (ref 135–145)
Total Bilirubin: 0.7 mg/dL (ref 0.3–1.2)
Total Protein: 7.3 g/dL (ref 6.5–8.1)

## 2020-11-11 MED ORDER — ACETAMINOPHEN 325 MG PO TABS
650.0000 mg | ORAL_TABLET | Freq: Once | ORAL | Status: AC
  Administered 2020-11-11: 650 mg via ORAL

## 2020-11-11 MED ORDER — ZOLEDRONIC ACID 4 MG/100ML IV SOLN: 4.0000 mg | Freq: Once | INTRAVENOUS | Status: DC

## 2020-11-11 MED ORDER — SODIUM CHLORIDE 0.9 % IV SOLN
Freq: Once | INTRAVENOUS | Status: AC
  Filled 2020-11-11: qty 250

## 2020-11-11 MED ORDER — ZOLEDRONIC ACID 4 MG/100ML IV SOLN
4.0000 mg | Freq: Once | INTRAVENOUS | Status: AC
  Administered 2020-11-11: 4 mg via INTRAVENOUS
  Filled 2020-11-11: qty 100

## 2020-11-11 MED ORDER — ACETAMINOPHEN 325 MG PO TABS
ORAL_TABLET | ORAL | Status: AC
  Filled 2020-11-11: qty 2

## 2020-11-11 NOTE — Patient Instructions (Signed)
[facility]  Discharge Instructions: Thank you for choosing [facility] to provide your oncology and hematology care.  If you have a lab appointment with the Cancer Center, please go directly to the Cancer Center and check in at the registration area.  Wear comfortable clothing and clothing appropriate for easy access to any Portacath or PICC line.   We strive to give you quality time with your provider. You may need to reschedule your appointment if you arrive late (15 or more minutes).  Arriving late affects you and other patients whose appointments are after yours.  Also, if you miss three or more appointments without notifying the office, you may be dismissed from the clinic at the provider's discretion.      For prescription refill requests, have your pharmacy contact our office and allow 72 hours for refills to be completed.    Today you received the following chemotherapy and/or immunotherapy agents ZOMETA      To help prevent nausea and vomiting after your treatment, we encourage you to take your nausea medication as directed.  BELOW ARE SYMPTOMS THAT SHOULD BE REPORTED IMMEDIATELY: *FEVER GREATER THAN 100.4 F (38 C) OR HIGHER *CHILLS OR SWEATING *NAUSEA AND VOMITING THAT IS NOT CONTROLLED WITH YOUR NAUSEA MEDICATION *UNUSUAL SHORTNESS OF BREATH *UNUSUAL BRUISING OR BLEEDING *URINARY PROBLEMS (pain or burning when urinating, or frequent urination) *BOWEL PROBLEMS (unusual diarrhea, constipation, pain near the anus) TENDERNESS IN MOUTH AND THROAT WITH OR WITHOUT PRESENCE OF ULCERS (sore throat, sores in mouth, or a toothache) UNUSUAL RASH, SWELLING OR PAIN  UNUSUAL VAGINAL DISCHARGE OR ITCHING   Items with * indicate a potential emergency and should be followed up as soon as possible or go to the Emergency Department if any problems should occur.  Please show the CHEMOTHERAPY ALERT CARD or IMMUNOTHERAPY ALERT CARD at check-in to  the Emergency Department and triage nurse.  Should you have questions after your visit or need to cancel or reschedule your appointment, please contact [facility]  [phone] and follow the prompts.  Office hours are 8:00 a.m. to 4:30 p.m. Monday - Friday. Please note that voicemails left after 4:00 p.m. may not be returned until the following business day.  We are closed weekends and major holidays. You have access to a nurse at all times for urgent questions. Please call the main number to the clinic [phone] and follow the prompts.  For any non-urgent questions, you may also contact your provider using MyChart. We now offer e-Visits for anyone 18 and older to request care online for non-urgent symptoms. For details visit [email or web address].   Also download the MyChart app! Go to the app store, search "MyChart", open the app, select [facility], and log in with your MyChart username and password.  Due to Covid, a mask is required upon entering the hospital/clinic. If you do not have a mask, one will be given to you upon arrival. For doctor visits, patients may have 1 support person aged 18 or older with them. For treatment visits, patients cannot have anyone with them due to current Covid guidelines and our immunocompromised population.   Zoledronic Acid Injection (Hypercalcemia, Oncology) What is this medication? ZOLEDRONIC ACID (ZOE le dron ik AS id) slows calcium loss from bones. It high calcium levels in the blood from some kinds of cancer. It may be used in other people at risk for bone loss. This medicine may be used for other purposes; ask   your health care provider or pharmacist if you have questions. COMMON BRAND NAME(S): Zometa What should I tell my care team before I take this medication? They need to know if you have any of these conditions: cancer dehydration dental disease kidney disease liver disease low levels of calcium in the  blood lung or breathing disease (asthma) receiving steroids like dexamethasone or prednisone an unusual or allergic reaction to zoledronic acid, other medicines, foods, dyes, or preservatives pregnant or trying to get pregnant breast-feeding How should I use this medication? This drug is injected into a vein. It is given by a health care provider in a hospital or clinic setting. Talk to your health care provider about the use of this drug in children. Special care may be needed. Overdosage: If you think you have taken too much of this medicine contact a poison control center or emergency room at once. NOTE: This medicine is only for you. Do not share this medicine with others. What if I miss a dose? Keep appointments for follow-up doses. It is important not to miss your dose. Call your health care provider if you are unable to keep an appointment. What may interact with this medication? certain antibiotics given by injection NSAIDs, medicines for pain and inflammation, like ibuprofen or naproxen some diuretics like bumetanide, furosemide teriparatide thalidomide This list may not describe all possible interactions. Give your health care provider a list of all the medicines, herbs, non-prescription drugs, or dietary supplements you use. Also tell them if you smoke, drink alcohol, or use illegal drugs. Some items may interact with your medicine. What should I watch for while using this medication? Visit your health care provider for regular checks on your progress. It may be some time before you see the benefit from this drug. Some people who take this drug have severe bone, joint, or muscle pain. This drug may also increase your risk for jaw problems or a broken thigh bone. Tell your health care provider right away if you have severe pain in your jaw, bones, joints, or muscles. Tell you health care provider if you have any pain that does not go away or that gets worse. Tell your dentist and  dental surgeon that you are taking this drug. You should not have major dental surgery while on this drug. See your dentist to have a dental exam and fix any dental problems before starting this drug. Take good care of your teeth while on this drug. Make sure you see your dentist for regular follow-up appointments. You should make sure you get enough calcium and vitamin D while you are taking this drug. Discuss the foods you eat and the vitamins you take with your health care provider. Check with your health care provider if you have severe diarrhea, nausea, and vomiting, or if you sweat a lot. The loss of too much body fluid may make it dangerous for you to take this drug. You may need blood work done while you are taking this drug. Do not become pregnant while taking this drug. Women should inform their health care provider if they wish to become pregnant or think they might be pregnant. There is potential for serious harm to an unborn child. Talk to your health care provider for more information. What side effects may I notice from receiving this medication? Side effects that you should report to your doctor or health care provider as soon as possible: allergic reactions (skin rash, itching or hives; swelling of the face, lips, or   tongue) bone pain infection (fever, chills, cough, sore throat, pain or trouble passing urine) jaw pain, especially after dental work joint pain kidney injury (trouble passing urine or change in the amount of urine) low blood pressure (dizziness; feeling faint or lightheaded, falls; unusually weak or tired) low calcium levels (fast heartbeat; muscle cramps or pain; pain, tingling, or numbness in the hands or feet; seizures) low magnesium levels (fast, irregular heartbeat; muscle cramp or pain; muscle weakness; tremors; seizures) low red blood cell counts (trouble breathing; feeling faint; lightheaded, falls; unusually weak or tired) muscle pain redness, blistering,  peeling, or loosening of the skin, including inside the mouth severe diarrhea swelling of the ankles, feet, hands trouble breathing Side effects that usually do not require medical attention (report to your doctor or health care provider if they continue or are bothersome): anxious constipation coughing depressed mood eye irritation, itching, or pain fever general ill feeling or flu-like symptoms nausea pain, redness, or irritation at site where injected trouble sleeping This list may not describe all possible side effects. Call your doctor for medical advice about side effects. You may report side effects to FDA at 1-800-FDA-1088. Where should I keep my medication? This drug is given in a hospital or clinic. It will not be stored at home. NOTE: This sheet is a summary. It may not cover all possible information. If you have questions about this medicine, talk to your doctor, pharmacist, or health care provider.  2022 Elsevier/Gold Standard (2018-12-29 09:13:00)  

## 2020-11-11 NOTE — Assessment & Plan Note (Addendum)
#   Stage IV/recurrent adenocarcinoma of the lung/ mediastinal lymphadenopathy/liver/  pancreatic head/left adrenal gland/ muscle and skeletal metastasis/Subcutaneous nodule/Brain- on B-RAF [id number]- on dabrafenib plus Mekinist. AUG 12th, 2022- No evidence of lung cancer recurrence or progression; stable right hilar lymph node; stable sclerotic metastatic disease in the pelvis/spine.  # Tolerating well Dab+Mek except for mild fatigue.  No rash.  No fevers.  # fatigue-multifactorial -post radiation/TKI. FEB 2022- TSH-WNL.  We will plan imaging prior to next visit.  # Brain metastases: s/p WBRT; overall improved.  Off steroids; MARCH 2022- MRI brain showed significant improvement of metastatic lesions to the brain-STABLE.   # Bone lesions-mildly symptomatic; continue Zometa every 4-6 weeks [30 mins; tylenol]. with infusion premedicated with Tylenol at home.   # COVID s/p paxlovid-symptoms currently resolved.  # DISPOSITION: [mon/-wed appt] # give tylenol 650 mg toady prior to infusion # follow up in 6 weeks; MD- labs- cbc/cmp; Zometa over 30 mins;prior-Dr.B.  # I reviewed the blood work- with the patient in detail; also reviewed the imaging independently [as summarized above]; and with the patient in detail.

## 2020-11-11 NOTE — Progress Notes (Signed)
[place] NOTE  Patient Care Team: [name], MD as PCP - General (Family Medicine) [name], MD as Consulting Physician (Family Medicine) [name], [name], MD (General Surgery) Thornton Park, DO as Consulting Physician (Radiation Oncology)  CHIEF COMPLAINTS/PURPOSE OF CONSULTATION: Lung cancer  #  Oncology History Overview Note  # OCT 2018- STAGE I- A. 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  - PARENCHYMAL MARGIN APPEARS CLEAR; TUMOR WAS 1 CM FROM MARGIN BEFORE REMOVAL OF STAPLE LINE.; NO adjuvant therapy.   # DEC 2021-- METASTATIC CARCINOMA, COMPATIBLE WITH PULMONARY ADENOCARCINOMA [left shoulder Core Biopsy];  NOV 24th PET-right hilar and Hypermetabolic mediastinal lymphadenopathy; small pulmonary nodules; 2 metastatic lesions in the liver/  pancreatic head/left adrenal gland;  muscle and skeletal metastasis.  Subcutaneous nodule-positive for metastatic adeno lung.   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  Short course of Eliquis.smoking-quit 2008.  ---------------------------------------    DIAGNOSIS: LUNG CA  STAGE:  IV      ;GOALS:palliation  CURRENT/MOST RECENT THERAPY: DAB+TREM    Primary cancer of right upper lobe of lung ([place])  04/09/2020 Cancer Staging   Staging form: Lung, AJCC 8th Edition - Clinical: Stage IVB (cN1, cM1c) - Signed by Cammie Sickle, MD on 04/09/2020      HISTORY OF PRESENTING ILLNESS: Ambulating by independently.  Accompanied by his wife. [name] 75 y.o.  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   In the interim patient had COVID-status post paxlovid.  No complications noted.  Resolved with outpatient treatment.  Patient continues to complain of mild fatigue.  However he has been going to the [employer]/gym.  Advised no nausea no vomiting.  No skin rash.  No fevers.  No new lumps or bumps.  No vision changes.  Review of Systems  Constitutional:  Positive for malaise/fatigue. Negative for chills, diaphoresis, fever and weight loss.  HENT:  Negative for nosebleeds and sore throat.   Eyes:  Negative for double vision.  Respiratory:  Negative for cough, hemoptysis, sputum production, shortness of breath and wheezing.   Cardiovascular:  Negative for chest pain, palpitations, orthopnea and leg swelling.  Gastrointestinal:  Negative for abdominal pain, blood in stool, constipation, diarrhea, heartburn, melena, nausea and vomiting.  Genitourinary:  Negative for dysuria, frequency and urgency.  Musculoskeletal:  Positive for back pain and joint pain.  Skin: Negative.  Negative for itching and rash.  Neurological:  Positive for weakness. Negative for dizziness, tingling, focal weakness and headaches.  Endo/Heme/Allergies:  Does not bruise/bleed easily.  Psychiatric/Behavioral:  Positive for memory loss. Negative for depression. The patient is not nervous/anxious and does not have insomnia.     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 Baruch Gouty  and Dr. Nadeen Landau   Phlebitis 1998   Primary cancer of right upper lobe of lung ([place]) 01/14/2017   Dr. Genevive Bi  performed a wedge resection of RUL.    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/27/2016    SURGICAL HISTORY: Past Surgical History:  Procedure Laterality Date   COLONOSCOPY  2013   Dr Vira Agar   COLONOSCOPY WITH PROPOFOL N/A 12/23/2016   Procedure: COLONOSCOPY WITH PROPOFOL;  Surgeon: Robert Bellow, MD;  Location: [facility] ENDOSCOPY;  Service: Endoscopy;  Laterality: N/A;   EYE SURGERY     HERNIA REPAIR Right 1990's   TENDON REPAIR Left 03/14/2019   Procedure: TENDON REPAIR AND GRAFT  TRANSFER EXTENSOR INDICIS PROPRIUS LEFT THUMB;  Surgeon: [name], MD;  Location: [facility];  Service: Orthopedics;  Laterality: Left;  AXILLARY BLOCK   THORACOTOMY Right 01/14/2017   Procedure: RIGHT THORACOSCOPYWITH WIDE WEDGE RESECTION,  PREOP BROCHOSCOPY;  Surgeon: [name], MD;  Location: [facility] ORS;  Service: General;  Laterality: Right;   THROAT SURGERY  2010    SOCIAL HISTORY: Social History   Socioeconomic History   Marital status: Married    Spouse name: Not on file   Number of children: Not on file   Years of education: Not on file   Highest education level: Not on file  Occupational History   Not on file  Tobacco Use   Smoking status: Former    Years: 50.00    Types: Cigarettes    Quit date: 03/31/2007    Years since quitting: 13.6   Smokeless tobacco: Never  Vaping Use   Vaping Use: Never used  Substance and Sexual Activity   Alcohol use: No   Drug use: No   Sexual activity: Yes    Birth control/protection: None    Comment: Married  Other Topics Concern   Not on file  Social History Narrative   Not on file   Social Determinants of Health   Financial Resource Strain: Not on file  Food Insecurity: Not on file  Transportation Needs: Not on file  Physical Activity: Not on file  Stress: Not on file  Social Connections: Not on file  Intimate Partner Violence: Not on file    FAMILY HISTORY: Family History  Problem Relation Age of Onset   Alzheimer's disease  Mother    Alzheimer's disease Father    Stroke Father    Colon cancer Neg Hx     ALLERGIES:  has No Known Allergies.  MEDICATIONS:  Current Outpatient Medications  Medication Sig Dispense Refill   Ascorbic Acid (VITAMIN C) 1000 MG tablet Take 1,000 mg by mouth daily.     aspirin EC 81 MG tablet Take 81 mg by mouth daily.     Calcium Carbonate-Vit D-Min (CALCIUM 1200 PO) Take 1 tablet by mouth daily.     dabrafenib mesylate (TAFINLAR) 75 MG capsule Take 2 capsules (150 mg total) by mouth 2 (two) times daily. Take on an empty stomach 1 hour before or 2 hours after meals. 063 capsule 3   folic acid (FOLVITE) 016 MCG tablet Take 800 mcg by mouth daily.     Glucosamine-Chondroitin (COSAMIN DS PO) Take 1 tablet by mouth daily.     Multiple Vitamin (MULTIVITAMIN WITH MINERALS) TABS tablet Take 1 tablet by mouth daily. Senior Multivitamin.     Multiple Vitamins-Minerals (EMERGEN-C IMMUNE PO) Take 1 tablet by mouth 2 (two) times daily.     niacinamide 500 MG tablet Take 500 mg by mouth 2 (two) times daily  with a meal.     Omega-3 1000 MG CAPS Take 1 tablet by mouth 1 day or 1 dose.     omeprazole (PRILOSEC) 40 MG capsule Take 40 mg by mouth daily before supper.      senna (SENOKOT) 8.6 MG tablet Take 1 tablet by mouth daily.     trametinib dimethyl sulfoxide (MEKINIST) 2 MG tablet Take 1 tablet (2 mg total) by mouth daily. Take 1 hour before or 2 hours after a meal. Store refrigerated in original container. 30 tablet 3   No current facility-administered medications for this visit.      Marland Kitchen  PHYSICAL EXAMINATION: ECOG PERFORMANCE STATUS: 0 - Asymptomatic  Vitals:   11/11/20 1348  BP: (!) 155/79  Pulse: 65  Resp: 16  Temp: (!) 97.1 F (36.2 C)  SpO2: 96%   Filed Weights   11/11/20 1348  Weight: 202 lb (91.6 kg)    Physical Exam HENT:     Head: Normocephalic and atraumatic.     Mouth/Throat:     Pharynx: No oropharyngeal exudate.  Eyes:     Pupils: Pupils are equal, round, and  reactive to light.  Cardiovascular:     Rate and Rhythm: Normal rate and regular rhythm.  Pulmonary:     Effort: Pulmonary effort is normal. No respiratory distress.     Breath sounds: Normal breath sounds. No wheezing.  Abdominal:     General: Bowel sounds are normal. There is no distension.     Palpations: Abdomen is soft. There is no mass.     Tenderness: There is no abdominal tenderness. There is no guarding or rebound.  Musculoskeletal:        General: No tenderness. Normal range of motion.     Cervical back: Normal range of motion and neck supple.  Skin:    General: Skin is warm.  Neurological:     Mental Status: He is alert and oriented to person, place, and time.  Psychiatric:        Mood and Affect: Affect normal.  ;  LABORATORY DATA:  I have reviewed the data as listed Lab Results  Component Value Date   WBC 5.8 11/11/2020   HGB 13.9 11/11/2020   HCT 41.7 11/11/2020   MCV 101.2 (H) 11/11/2020   PLT 179 11/11/2020   Recent Labs    08/05/20 1257 09/16/20 1340 11/08/20 1031 11/11/20 1333  NA 136 135  --  136  K 4.8 5.0  --  4.1  CL 101 98  --  100  CO2 26 29  --  28  GLUCOSE 143* 116*  --  120*  BUN 17 19  --  13  CREATININE 1.10 1.09 0.90 0.94  CALCIUM 9.1 9.0  --  9.0  GFRNONAA >60 >60  --  >60  PROT 7.2 6.7  --  7.3  ALBUMIN 3.8 3.6  --  3.6  AST 30 28  --  32  ALT 20 20  --  24  ALKPHOS 50 58  --  55  BILITOT 0.8 0.9  --  0.7    RADIOGRAPHIC STUDIES: I have personally reviewed the radiological images as listed and agreed with the findings in the report. CT CHEST ABDOMEN PELVIS W CONTRAST  Result Date: 11/10/2020 CLINICAL DATA:  Non-small cell lung cancer. Assess treatment response. EXAM: CT CHEST, ABDOMEN, AND PELVIS WITH CONTRAST TECHNIQUE: Multidetector CT imaging of the chest, abdomen and pelvis was performed following the standard protocol during bolus administration of intravenous  contrast. CONTRAST:  138mL OMNIPAQUE IOHEXOL 350 MG/ML SOLN  COMPARISON:  PET-CT 06/25/2020, CT 02/09/2020 FINDINGS: CT CHEST FINDINGS Cardiovascular: Coronary artery calcification and aortic atherosclerotic calcification. Mediastinum/Nodes: No axillary supraclavicular adenopathy. No mediastinal adenopathy. RIGHT hilar node measures 13 mm (29/4) decreased from 20 mm on CT 2021. Difficult to compare on noncontrast PET-CT scans. No metabolic activity on recent PET-CT scan. Normal esophagus. Lungs/Pleura: Several small nodules in the RIGHT lung again demonstrated not changed from prior CT exam PET-CT exam. Example 3 mm nodule in the RIGHT middle lobe (image 85/2) compares to 3 mm on CT 02/09/2020. Middle lobe nodule measuring 5 mm (image 96) compares with 5 mm. No new nodules are present. Musculoskeletal: Multiple round sclerotic lesions in the spine not changed from comparison PET-CT scan. Example lesion at T12 measuring 16 mm compares to 15 mm. CT ABDOMEN AND PELVIS FINDINGS Hepatobiliary: Low-density lesion in the central liver has simple fluid attenuation. Previously seen metastatic lesions are not demonstrated. Small gallstone noted Pancreas: Pancreas is normal. No ductal dilatation. No pancreatic inflammation. Spleen: Normal spleen Adrenals/urinary tract: Thickening LEFT adrenal gland is unchanged. Kidneys, ureters and bladder normal. Small calcification within the RIGHT kidney. Stomach/Bowel: Stomach, small bowel, appendix, and cecum are normal. Multiple diverticula of the descending colon and sigmoid colon without acute inflammation. Vascular/Lymphatic: Abdominal aorta is normal caliber. There is no retroperitoneal or periportal lymphadenopathy. No pelvic lymphadenopathy. Reproductive: Prostate normal Other: No free fluid. Musculoskeletal: Multiple small round sclerotic lesions in the pelvis and spine are unchanged. For example rounded lumbar spine lesion measuring 17 mm ( 83/4) compares to 18 mm. IMPRESSION: Chest Impression: 1. No evidence of lung cancer recurrence or  progression. 2. Stable small nodules in the RIGHT middle lobe favored benign. Recommend continued attention on follow-up. 3. Stable enlarged RIGHT hilar lymph node is not metabolic on comparison FDG PET exam Abdomen / Pelvis Impression: 1. Hepatic metastasis are not identified. 2. No adenopathy in the abdomen pelvis. 3. Stable round sclerotic metastatic lesions in the pelvis and spine. Electronically Signed   By: Suzy Bouchard M.D.   On: 11/10/2020 07:03    ASSESSMENT & PLAN:   Primary cancer of right upper lobe of lung ([place]) # Stage IV/recurrent adenocarcinoma of the lung/ mediastinal lymphadenopathy/liver/  pancreatic head/left adrenal gland/ muscle and skeletal metastasis/Subcutaneous nodule/Brain- on B-RAF V600E- on dabrafenib plus Mekinist. AUG 12th, 2022- No evidence of lung cancer recurrence or progression; stable right hilar lymph node; stable sclerotic metastatic disease in the pelvis/spine.  # Tolerating well Dab+Mek except for mild fatigue.  No rash.  No fevers.  # fatigue-multifactorial -post radiation/TKI. FEB 2022- TSH-WNL.  We will plan imaging prior to next visit.  # Brain metastases: s/p WBRT; overall improved.  Off steroids; MARCH 2022- MRI brain showed significant improvement of metastatic lesions to the brain-STABLE.   # Bone lesions-mildly symptomatic; continue Zometa every 4-6 weeks [30 mins; tylenol]. with infusion premedicated with Tylenol at home.   # COVID s/p paxlovid-symptoms currently resolved.  # DISPOSITION: [mon/-wed appt] # give tylenol 650 mg toady prior to infusion # follow up in 6 weeks; MD- labs- cbc/cmp; Zometa over 30 mins;prior-Dr.B.  # I reviewed the blood work- with the patient in detail; also reviewed the imaging independently [as summarized above]; and with the patient in detail.     All questions were answered. The patient knows to call the clinic with any problems, questions or concerns.    Cammie Sickle, MD 11/24/2020 5:15 PM

## 2020-11-12 DIAGNOSIS — Z125 Encounter for screening for malignant neoplasm of prostate: Secondary | ICD-10-CM | POA: Diagnosis not present

## 2020-11-12 DIAGNOSIS — I10 Essential (primary) hypertension: Secondary | ICD-10-CM | POA: Diagnosis not present

## 2020-11-20 DIAGNOSIS — K219 Gastro-esophageal reflux disease without esophagitis: Secondary | ICD-10-CM | POA: Diagnosis not present

## 2020-11-20 DIAGNOSIS — C3490 Malignant neoplasm of unspecified part of unspecified bronchus or lung: Secondary | ICD-10-CM | POA: Diagnosis not present

## 2020-11-20 DIAGNOSIS — R972 Elevated prostate specific antigen [PSA]: Secondary | ICD-10-CM | POA: Diagnosis not present

## 2020-11-20 DIAGNOSIS — Z125 Encounter for screening for malignant neoplasm of prostate: Secondary | ICD-10-CM | POA: Diagnosis not present

## 2020-11-20 DIAGNOSIS — Z0000 Encounter for general adult medical examination without abnormal findings: Secondary | ICD-10-CM | POA: Diagnosis not present

## 2020-11-24 ENCOUNTER — Encounter: Payer: Self-pay | Admitting: Internal Medicine

## 2020-12-16 ENCOUNTER — Telehealth: Payer: Self-pay | Admitting: *Deleted

## 2020-12-16 NOTE — Telephone Encounter (Signed)
Spoke with patient. He sees Dr. Mike Craze at [facility]. I explained to patient that MD is ok with a routine filling replacement. However, he should avoid any tooth extractions. I gave the patient my personal clinical line to have the dental office call our office.

## 2020-12-16 NOTE — Telephone Encounter (Signed)
Patient called stating that he lost a filling last night and has an appointment at 11 to have it filled again. He is asking if that is alright. When asked, he states that he is getting home immuno and chemotherapy as well as Zometa infusions in office. I asked that he postpone the 11:00 appointment until I can get in touch with Dr B, he agreed to do so. Please advise

## 2020-12-23 ENCOUNTER — Other Ambulatory Visit: Payer: Self-pay

## 2020-12-23 ENCOUNTER — Inpatient Hospital Stay: Payer: PPO

## 2020-12-23 ENCOUNTER — Inpatient Hospital Stay: Payer: PPO | Attending: Internal Medicine

## 2020-12-23 ENCOUNTER — Inpatient Hospital Stay: Payer: PPO | Admitting: Internal Medicine

## 2020-12-23 VITALS — BP 144/91 | HR 75 | Temp 97.6°F | Resp 20 | Ht 71.0 in | Wt 201.0 lb

## 2020-12-23 DIAGNOSIS — R972 Elevated prostate specific antigen [PSA]: Secondary | ICD-10-CM | POA: Insufficient documentation

## 2020-12-23 DIAGNOSIS — Z79899 Other long term (current) drug therapy: Secondary | ICD-10-CM | POA: Diagnosis not present

## 2020-12-23 DIAGNOSIS — C7931 Secondary malignant neoplasm of brain: Secondary | ICD-10-CM | POA: Insufficient documentation

## 2020-12-23 DIAGNOSIS — Z7982 Long term (current) use of aspirin: Secondary | ICD-10-CM | POA: Insufficient documentation

## 2020-12-23 DIAGNOSIS — Z8521 Personal history of malignant neoplasm of larynx: Secondary | ICD-10-CM | POA: Diagnosis not present

## 2020-12-23 DIAGNOSIS — Z923 Personal history of irradiation: Secondary | ICD-10-CM | POA: Diagnosis not present

## 2020-12-23 DIAGNOSIS — R351 Nocturia: Secondary | ICD-10-CM

## 2020-12-23 DIAGNOSIS — C7951 Secondary malignant neoplasm of bone: Secondary | ICD-10-CM

## 2020-12-23 DIAGNOSIS — Z87891 Personal history of nicotine dependence: Secondary | ICD-10-CM | POA: Insufficient documentation

## 2020-12-23 DIAGNOSIS — C787 Secondary malignant neoplasm of liver and intrahepatic bile duct: Secondary | ICD-10-CM | POA: Insufficient documentation

## 2020-12-23 DIAGNOSIS — C3411 Malignant neoplasm of upper lobe, right bronchus or lung: Secondary | ICD-10-CM

## 2020-12-23 DIAGNOSIS — Z9221 Personal history of antineoplastic chemotherapy: Secondary | ICD-10-CM | POA: Insufficient documentation

## 2020-12-23 LAB — COMPREHENSIVE METABOLIC PANEL
ALT: 22 U/L (ref 0–44)
AST: 30 U/L (ref 15–41)
Albumin: 3.4 g/dL — ABNORMAL LOW (ref 3.5–5.0)
Alkaline Phosphatase: 63 U/L (ref 38–126)
Anion gap: 7 (ref 5–15)
BUN: 17 mg/dL (ref 8–23)
CO2: 29 mmol/L (ref 22–32)
Calcium: 8.8 mg/dL — ABNORMAL LOW (ref 8.9–10.3)
Chloride: 99 mmol/L (ref 98–111)
Creatinine, Ser: 1.04 mg/dL (ref 0.61–1.24)
GFR, Estimated: >60 mL/min (ref >60)
Glucose, Bld: 113 mg/dL — ABNORMAL HIGH (ref 70–99)
Potassium: 4.4 mmol/L (ref 3.5–5.1)
Sodium: 135 mmol/L (ref 135–145)
Total Bilirubin: 0.5 mg/dL (ref 0.3–1.2)
Total Protein: 6.8 g/dL (ref 6.5–8.1)

## 2020-12-23 LAB — CBC WITH DIFFERENTIAL/PLATELET
Abs Immature Granulocytes: 0.02 10*3/uL (ref 0.00–0.07)
Basophils Absolute: 0.1 10*3/uL (ref 0.0–0.1)
Basophils Relative: 1 %
Eosinophils Absolute: 0.2 10*3/uL (ref 0.0–0.5)
Eosinophils Relative: 3 %
HCT: 42 % (ref 39.0–52.0)
Hemoglobin: 13.7 g/dL (ref 13.0–17.0)
Immature Granulocytes: 0 %
Lymphocytes Relative: 30 %
Lymphs Abs: 2 10*3/uL (ref 0.7–4.0)
MCH: 33.2 pg (ref 26.0–34.0)
MCHC: 32.6 g/dL (ref 30.0–36.0)
MCV: 101.7 fL — ABNORMAL HIGH (ref 80.0–100.0)
Monocytes Absolute: 0.5 10*3/uL (ref 0.1–1.0)
Monocytes Relative: 7 %
Neutro Abs: 3.9 10*3/uL (ref 1.7–7.7)
Neutrophils Relative %: 59 %
Platelets: 181 10*3/uL (ref 150–400)
RBC: 4.13 MIL/uL — ABNORMAL LOW (ref 4.22–5.81)
RDW: 14.6 % (ref 11.5–15.5)
WBC: 6.7 10*3/uL (ref 4.0–10.5)
nRBC: 0 % (ref 0.0–0.2)

## 2020-12-23 NOTE — Progress Notes (Signed)
[facility] NOTE  Patient Care Team: [name], MD as PCP - General (Family Medicine) [name], MD as Consulting Physician (Family Medicine) [name], [name], MD (General Surgery) [place] Park, DO as Consulting Physician (Radiation Oncology)  CHIEF COMPLAINTS/PURPOSE OF CONSULTATION: Lung cancer  #  Oncology History Overview Note  # OCT 2018- STAGE I- A. 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  - PARENCHYMAL MARGIN APPEARS CLEAR; TUMOR WAS 1 CM FROM MARGIN BEFORE REMOVAL OF STAPLE LINE.; NO adjuvant therapy.   # DEC 2021-- METASTATIC CARCINOMA, COMPATIBLE WITH PULMONARY ADENOCARCINOMA [left shoulder Core Biopsy];  NOV 24th PET-right hilar and Hypermetabolic mediastinal lymphadenopathy; small pulmonary nodules; 2 metastatic lesions in the liver/  pancreatic head/left adrenal gland;  muscle and skeletal metastasis.  Subcutaneous nodule-positive for metastatic adeno lung.   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  Short course of Eliquis.smoking-quit 2008.  ---------------------------------------    DIAGNOSIS: LUNG CA  STAGE:  IV      ;GOALS:palliation  CURRENT/MOST RECENT THERAPY: DAB+TREM    Primary cancer of right upper lobe of lung ([place])  04/09/2020 Cancer Staging   Staging form: Lung, AJCC 8th Edition - Clinical: Stage IVB (cN1, cM1c) - Signed by Cammie Sickle, MD on 04/09/2020      HISTORY OF PRESENTING ILLNESS: Ambulating by independently.  Accompanied by his wife.  Curtis Andrade 75 y.o.  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  Worsening fatigue especially after zometa infusion.  No nausea no vomiting.  No skin rash.  No fevers.  No new lumps or bumps.  No vision changes.  However he has been going to the [employer]/gym.  Also wife noted to have some memory changes.  Review of Systems  Constitutional:  Positive for malaise/fatigue. Negative for chills, diaphoresis, fever and weight loss.  HENT:  Negative for nosebleeds and sore throat.   Eyes:  Negative for double vision.  Respiratory:  Negative for cough, hemoptysis, sputum production, shortness of breath and wheezing.   Cardiovascular:  Negative for chest pain, palpitations, orthopnea and leg swelling.  Gastrointestinal:  Negative for abdominal pain, blood in stool, constipation, diarrhea, heartburn, melena, nausea and vomiting.  Genitourinary:  Negative for dysuria, frequency and urgency.  Musculoskeletal:  Positive for back pain and joint pain.  Skin: Negative.  Negative for itching and rash.  Neurological:  Positive for weakness. Negative for dizziness, tingling, focal weakness and headaches.  Endo/Heme/Allergies:  Does not bruise/bleed easily.  Psychiatric/Behavioral:  Positive for memory loss. Negative for depression. The patient is not nervous/anxious and does not have insomnia.     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 Baruch Gouty and Dr. Nadeen Landau   Phlebitis 1998   Primary cancer of right upper lobe of lung (  [place]) 01/14/2017   Dr. Genevive Bi performed a wedge resection of RUL.    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/27/2016    SURGICAL HISTORY: Past Surgical History:   Procedure Laterality Date   COLONOSCOPY  2013   Dr Vira Agar   COLONOSCOPY WITH PROPOFOL N/A 12/23/2016   Procedure: COLONOSCOPY WITH PROPOFOL;  Surgeon: Robert Bellow, MD;  Location: [facility] ENDOSCOPY;  Service: Endoscopy;  Laterality: N/A;   EYE SURGERY     HERNIA REPAIR Right 1990's   TENDON REPAIR Left 03/14/2019   Procedure: TENDON REPAIR AND GRAFT  TRANSFER EXTENSOR INDICIS PROPRIUS LEFT THUMB;  Surgeon: [name], MD;  Location: [place];  Service: Orthopedics;  Laterality: Left;  AXILLARY BLOCK   THORACOTOMY Right 01/14/2017   Procedure: RIGHT THORACOSCOPYWITH WIDE WEDGE RESECTION,  PREOP BROCHOSCOPY;  Surgeon: [name], MD;  Location: [facility] ORS;  Service: General;  Laterality: Right;   THROAT SURGERY  2010    SOCIAL HISTORY: Social History   Socioeconomic History   Marital status: Married    Spouse name: Not on file   Number of children: Not on file   Years of education: Not on file   Highest education level: Not on file  Occupational History   Not on file  Tobacco Use   Smoking status: Former    Years: 50.00    Types: Cigarettes    Quit date: 03/31/2007    Years since quitting: 13.7   Smokeless tobacco: Never  Vaping Use   Vaping Use: Never used  Substance and Sexual Activity   Alcohol use: No   Drug use: No   Sexual activity: Yes    Birth control/protection: None    Comment: Married  Other Topics Concern   Not on file  Social History Narrative   Not on file   Social Determinants of Health   Financial Resource Strain: Not on file  Food Insecurity: Not on file  Transportation Needs: Not on file  Physical Activity: Not on file  Stress: Not on file  Social Connections: Not on file  Intimate Partner Violence: Not on file    FAMILY HISTORY: Family History  Problem Relation Age of Onset   Alzheimer's disease Mother    Alzheimer's disease Father    Stroke Father    Colon cancer Neg Hx     ALLERGIES:  has No Known  Allergies.  MEDICATIONS:  Current Outpatient Medications  Medication Sig Dispense Refill   Ascorbic Acid (VITAMIN C) 1000 MG tablet Take 1,000 mg by mouth daily.     aspirin EC 81 MG tablet Take 81 mg by mouth daily.     Calcium Carbonate-Vit D-Min (CALCIUM 1200 PO) Take 1 tablet by mouth daily.     dabrafenib mesylate (TAFINLAR) 75 MG capsule Take 2 capsules (150 mg total) by mouth 2 (two) times daily. Take on an empty stomach 1 hour before or 2 hours after meals. 825 capsule 3   folic acid (FOLVITE) 053 MCG tablet Take 800 mcg by mouth daily.     Glucosamine-Chondroitin (COSAMIN DS PO) Take 1 tablet by mouth daily.     Multiple Vitamin (MULTIVITAMIN WITH MINERALS) TABS tablet Take 1 tablet by mouth daily. Senior Multivitamin.     Multiple Vitamins-Minerals (EMERGEN-C IMMUNE PO) Take 1 tablet by mouth 2 (two) times daily.     niacinamide 500 MG tablet Take 500 mg by mouth 2 (two) times daily with a meal.     Omega-3 1000 MG CAPS Take 1 tablet by mouth 1 day  or 1 dose.     omeprazole (PRILOSEC) 40 MG capsule Take 40 mg by mouth daily before supper.      senna (SENOKOT) 8.6 MG tablet Take 1 tablet by mouth daily.     trametinib dimethyl sulfoxide (MEKINIST) 2 MG tablet Take 1 tablet (2 mg total) by mouth daily. Take 1 hour before or 2 hours after a meal. Store refrigerated in original container. 30 tablet 3   No current facility-administered medications for this visit.      Marland Kitchen  PHYSICAL EXAMINATION: ECOG PERFORMANCE STATUS: 0 - Asymptomatic  Vitals:   12/23/20 1326  BP: (!) 144/91  Pulse: 75  Resp: 20  Temp: 97.6 F (36.4 C)   Filed Weights   12/23/20 1326  Weight: 201 lb (91.2 kg)    Physical Exam HENT:     Head: Normocephalic and atraumatic.     Mouth/Throat:     Pharynx: No oropharyngeal exudate.  Eyes:     Pupils: Pupils are equal, round, and reactive to light.  Cardiovascular:     Rate and Rhythm: Normal rate and regular rhythm.  Pulmonary:     Effort: Pulmonary  effort is normal. No respiratory distress.     Breath sounds: Normal breath sounds. No wheezing.  Abdominal:     General: Bowel sounds are normal. There is no distension.     Palpations: Abdomen is soft. There is no mass.     Tenderness: There is no abdominal tenderness. There is no guarding or rebound.  Musculoskeletal:        General: No tenderness. Normal range of motion.     Cervical back: Normal range of motion and neck supple.  Skin:    General: Skin is warm.  Neurological:     Mental Status: He is alert and oriented to person, place, and time.  Psychiatric:        Mood and Affect: Affect normal.  ;  LABORATORY DATA:  I have reviewed the data as listed Lab Results  Component Value Date   WBC 6.7 12/23/2020   HGB 13.7 12/23/2020   HCT 42.0 12/23/2020   MCV 101.7 (H) 12/23/2020   PLT 181 12/23/2020   Recent Labs    09/16/20 1340 11/08/20 1031 11/11/20 1333 12/23/20 1302  NA 135  --  136 135  K 5.0  --  4.1 4.4  CL 98  --  100 99  CO2 29  --  28 29  GLUCOSE 116*  --  120* 113*  BUN 19  --  13 17  CREATININE 1.09 0.90 0.94 1.04  CALCIUM 9.0  --  9.0 8.8*  GFRNONAA >60  --  >60 >60  PROT 6.7  --  7.3 6.8  ALBUMIN 3.6  --  3.6 3.4*  AST 28  --  32 30  ALT 20  --  24 22  ALKPHOS 58  --  55 63  BILITOT 0.9  --  0.7 0.5    RADIOGRAPHIC STUDIES: I have personally reviewed the radiological images as listed and agreed with the findings in the report. No results found.  ASSESSMENT & PLAN:   Primary cancer of right upper lobe of lung ([place]) # Stage IV/recurrent adenocarcinoma of the lung/ mediastinal lymphadenopathy/liver/  pancreatic head/left adrenal gland/ muscle and skeletal metastasis/Subcutaneous nodule/Brain- on B-RAF V600E- on dabrafenib plus Mekinist. AUG 12th, 2022- No evidence of lung cancer recurrence or progression; stable right hilar lymph node; stable sclerotic metastatic disease in the pelvis/spine.  # Tolerating well  Dab+Mek except for moderate  fatigue/see below.  No rash.  No fevers.  # Brain metastases: s/p WBRT; overall improved.  Off steroids; MARCH 2022- MRI brain showed significant improvement of metastatic lesions to the brain- STABLE.  Will order scans of brain.   # Bone lesions-mildly symptomatic; continue Zometa every 4-6 weeks [30 mins; tylenol]. with infusion premedicated with Tylenol at home.; HOLD today sec to fatigu see below  #Fatigue appears multifactorial-s/p whole brain radiation; on chemotherapy; ? Zometa; ? Nocturia [see below]  # PSA elevation: As per PCP-PSA- 4.47.  Question BPH causing nocturia /causing difficulty sleeping/fatigue.  will refer to Urology.   # DISPOSITION: [mon/-wed appt] # refer to Urology.  PSA elevation: 4.47/nocturia  # HOLD Zometa today # follow up in 4 weeks; MD- labs- cbc/cmp; Zometa over 30 mins;prior-MRI brain Dr.B.   All questions were answered. The patient knows to call the clinic with any problems, questions or concerns.    Cammie Sickle, MD 12/23/2020 2:37 PM

## 2020-12-23 NOTE — Assessment & Plan Note (Addendum)
#   Stage IV/recurrent adenocarcinoma of the lung/ mediastinal lymphadenopathy/liver/  pancreatic head/left adrenal gland/ muscle and skeletal metastasis/Subcutaneous nodule/Brain- on B-RAF V600E- on dabrafenib plus Mekinist. AUG 12th, 2022- No evidence of lung cancer recurrence or progression; stable right hilar lymph node; stable sclerotic metastatic disease in the pelvis/spine.  # Tolerating well Dab+Mek except for moderate fatigue/see below.  No rash.  No fevers.  # Brain metastases: s/p WBRT; overall improved.  Off steroids; MARCH 2022- MRI brain showed significant improvement of metastatic lesions to the brain- STABLE.  Will order scans of brain.   # Bone lesions-mildly symptomatic; continue Zometa every 4-6 weeks [30 mins; tylenol]. with infusion premedicated with Tylenol at home.; HOLD today sec to fatigu see below  #Fatigue appears multifactorial-s/p whole brain radiation; on chemotherapy; ? Zometa; ? Nocturia [see below]  # PSA elevation: As per PCP-PSA- 4.47.  Question BPH causing nocturia /causing difficulty sleeping/fatigue.  will refer to Urology.   # DISPOSITION: [mon/-wed appt] # refer to Urology.  PSA elevation: 4.47/nocturia  # HOLD Zometa today # follow up in 4 weeks; MD- labs- cbc/cmp; Zometa over 30 mins;prior-MRI brain Dr.B.

## 2020-12-23 NOTE — Progress Notes (Signed)
Pt c/o Memory changes over the last 6 weeks. C/o Hearing loss, fatigue, napping frequently, visual acuity changes.  Also would like to discuss elevated psa of 4.47

## 2021-01-07 ENCOUNTER — Telehealth: Payer: Self-pay | Admitting: *Deleted

## 2021-01-07 DIAGNOSIS — K859 Acute pancreatitis without necrosis or infection, unspecified: Secondary | ICD-10-CM | POA: Diagnosis not present

## 2021-01-07 DIAGNOSIS — R1011 Right upper quadrant pain: Secondary | ICD-10-CM | POA: Diagnosis not present

## 2021-01-07 DIAGNOSIS — C7889 Secondary malignant neoplasm of other digestive organs: Secondary | ICD-10-CM | POA: Diagnosis not present

## 2021-01-07 DIAGNOSIS — R1013 Epigastric pain: Secondary | ICD-10-CM | POA: Diagnosis not present

## 2021-01-07 DIAGNOSIS — C7951 Secondary malignant neoplasm of bone: Secondary | ICD-10-CM | POA: Diagnosis not present

## 2021-01-07 DIAGNOSIS — C3490 Malignant neoplasm of unspecified part of unspecified bronchus or lung: Secondary | ICD-10-CM | POA: Diagnosis not present

## 2021-01-07 DIAGNOSIS — C7931 Secondary malignant neoplasm of brain: Secondary | ICD-10-CM | POA: Diagnosis not present

## 2021-01-07 DIAGNOSIS — Z79899 Other long term (current) drug therapy: Secondary | ICD-10-CM | POA: Diagnosis not present

## 2021-01-07 DIAGNOSIS — I7143 Infrarenal abdominal aortic aneurysm, without rupture: Secondary | ICD-10-CM | POA: Diagnosis not present

## 2021-01-07 NOTE — Telephone Encounter (Signed)
1355-Patient's wife called and left a vm for Jonel Weldon, RN to call her back.  I called wife back at 54- pt went to the [facility] urgent care for elevated bp. Belching and back and arm pain. Per wife, Urgent care doctor believes pt has an inflamed gallbladder and needs to go to ER. Wife/pt agrees to go to [facility]- pt's preference. She said to let Dr. B know all this and she will keep [place] updated.

## 2021-01-08 ENCOUNTER — Ambulatory Visit: Payer: PPO | Admitting: Urology

## 2021-01-08 ENCOUNTER — Encounter: Payer: Self-pay | Admitting: Internal Medicine

## 2021-01-08 DIAGNOSIS — C7951 Secondary malignant neoplasm of bone: Secondary | ICD-10-CM

## 2021-01-08 NOTE — Telephone Encounter (Signed)
Called patient on 10/12- per wife, pt d/c from [facility]. Was not interested in surgical referral at [facility] and wanted care at [facility]. Dr. B stated that symptoms may be also r/t oral chemo. Pt/wife- instructed to have patient- hold his oral chemotherapy and to be seen in [facility] tomorrow to reevaluate his symptoms.

## 2021-01-08 NOTE — Progress Notes (Deleted)
01/08/2021 7:24 AM   Curtis Andrade. Jan 07, 1946 [id number]  Referring provider: Cammie Sickle, MD [place],  Curtis Andrade [id number]  No chief complaint on file.   HPI: Curtis Andrade. is a 75 y.o. male referred for evaluation of an elevated PSA.  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 Baruch Gouty and Dr. Nadeen Landau   Phlebitis 1998   Primary cancer of right upper lobe of lung ([place]) 01/14/2017   Dr. Genevive Bi performed a wedge resection of RUL.    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/27/2016    Surgical History: Past Surgical History:  Procedure Laterality Date   COLONOSCOPY  2013   Dr Vira Agar   COLONOSCOPY WITH PROPOFOL N/A 12/23/2016   Procedure: COLONOSCOPY WITH PROPOFOL;  Surgeon: Robert Bellow, MD;  Location: [facility] ENDOSCOPY;  Service: Endoscopy;  Laterality: N/A;   EYE SURGERY     HERNIA REPAIR Right 1990's   TENDON REPAIR Left 03/14/2019   Procedure: TENDON REPAIR AND GRAFT  TRANSFER EXTENSOR INDICIS PROPRIUS LEFT THUMB;  Surgeon: [name], MD;  Location: [place];  Service: Orthopedics;  Laterality: Left;  AXILLARY BLOCK   THORACOTOMY Right 01/14/2017   Procedure: RIGHT THORACOSCOPYWITH WIDE WEDGE RESECTION,  PREOP BROCHOSCOPY;  Surgeon: [name], MD;  Location: [facility] ORS;  Service: General;  Laterality: Right;   THROAT SURGERY  2010    Home Medications:  Allergies as of 01/08/2021   No Known Allergies       Medication List        Accurate as of January 08, 2021  7:24 AM. If you have any questions, ask your nurse or doctor.          aspirin EC 81 MG tablet Take 81 mg by mouth daily.   CALCIUM 1200 PO Take 1 tablet by mouth daily.   COSAMIN DS PO Take 1 tablet by mouth daily.   dabrafenib mesylate 75 MG capsule Commonly known as: TAFINLAR Take 2 capsules (150 mg total) by mouth 2 (two) times daily. Take on an empty stomach 1 hour before or 2 hours after meals.   EMERGEN-C IMMUNE PO Take 1 tablet by mouth 2 (two) times daily.   folic acid 010 MCG tablet Commonly known as: FOLVITE Take 800 mcg by mouth daily.   multivitamin with minerals Tabs tablet Take 1 tablet by mouth daily. Senior Multivitamin.   niacinamide 500 MG tablet Take 500 mg by mouth 2 (two) times daily with a meal.   Omega-3 1000 MG Caps Take 1 tablet by mouth 1 day or 1 dose.   omeprazole 40 MG capsule Commonly known as: PRILOSEC Take 40 mg by mouth daily before supper.   senna 8.6 MG tablet Commonly known as: SENOKOT Take 1 tablet by mouth daily.   trametinib dimethyl sulfoxide 2 MG tablet Commonly known as: MEKINIST Take 1 tablet (2 mg total) by mouth daily. Take 1 hour before or 2 hours after a meal. Store refrigerated  in original container.   vitamin C 1000 MG tablet Take 1,000 mg by mouth daily.        Allergies: No Known Allergies  Family History: Family History  Problem Relation Age of Onset   Alzheimer's disease Mother    Alzheimer's disease Father    Stroke Father    Colon cancer Neg Hx     Social History:  reports that he quit smoking about 13 years ago. His smoking use included cigarettes. He has never used smokeless tobacco. He reports that he does not drink alcohol and does not use drugs.   Physical Exam: There were no vitals taken for this visit.  Constitutional:  Alert and oriented, No acute distress. HEENT: [place] AT, moist mucus membranes.  Trachea midline, no  masses. Cardiovascular: No clubbing, cyanosis, or edema. Respiratory: Normal respiratory effort, no increased work of breathing. GI: Abdomen is soft, nontender, nondistended, no abdominal masses GU: No CVA tenderness Lymph: No cervical or inguinal lymphadenopathy. Skin: No rashes, bruises or suspicious lesions. Neurologic: Grossly intact, no focal deficits, moving all 4 extremities. Psychiatric: Normal mood and affect.  Laboratory Data: Lab Results  Component Value Date   WBC 6.7 12/23/2020   HGB 13.7 12/23/2020   HCT 42.0 12/23/2020   MCV 101.7 (H) 12/23/2020   PLT 181 12/23/2020    Lab Results  Component Value Date   CREATININE 1.04 12/23/2020    No results found for: PSA  No results found for: TESTOSTERONE  No results found for: HGBA1C  Urinalysis No results found for: COLORURINE, APPEARANCEUR, LABSPEC, PHURINE, GLUCOSEU, HGBUR, BILIRUBINUR, KETONESUR, PROTEINUR, UROBILINOGEN, NITRITE, LEUKOCYTESUR  No results found for: LABMICR, [place], RBCUA, LABEPIT, MUCUS, BACTERIA  Pertinent Imaging: *** No results found for this or any previous visit.  No results found for this or any previous visit.  No results found for this or any previous visit.  No results found for this or any previous visit.  No results found for this or any previous visit.  No results found for this or any previous visit.  No results found for this or any previous visit.  No results found for this or any previous visit.   Assessment & Plan:    There are no diagnoses linked to this encounter.  No follow-ups on file.  Abbie Sons, [facility] [address] [place],  [address] [phone]

## 2021-01-08 NOTE — Progress Notes (Signed)
I called to check on patient given his recent admission at [facility] emergency room for acute pancreatitis. Question related to chemotherapy medication. Recommend holding the chemotherapeutic medication.  Recommend continued fluids Oral intake. Follow up with [place] in the morning tomorrow.   However if patient as worsening abdominal pain or distention or Abnormal vital signs patient should reach/go to the emergency room tonight. Disc uss vacations wife in detail. She's an agreement.

## 2021-01-09 ENCOUNTER — Other Ambulatory Visit: Payer: Self-pay

## 2021-01-09 ENCOUNTER — Inpatient Hospital Stay: Payer: PPO

## 2021-01-09 ENCOUNTER — Inpatient Hospital Stay: Payer: PPO | Attending: Hospice and Palliative Medicine | Admitting: Nurse Practitioner

## 2021-01-09 ENCOUNTER — Other Ambulatory Visit: Payer: Self-pay | Admitting: *Deleted

## 2021-01-09 ENCOUNTER — Encounter: Payer: Self-pay | Admitting: Nurse Practitioner

## 2021-01-09 DIAGNOSIS — Z87891 Personal history of nicotine dependence: Secondary | ICD-10-CM | POA: Insufficient documentation

## 2021-01-09 DIAGNOSIS — Z8521 Personal history of malignant neoplasm of larynx: Secondary | ICD-10-CM | POA: Diagnosis not present

## 2021-01-09 DIAGNOSIS — C7889 Secondary malignant neoplasm of other digestive organs: Secondary | ICD-10-CM | POA: Diagnosis not present

## 2021-01-09 DIAGNOSIS — K769 Liver disease, unspecified: Secondary | ICD-10-CM

## 2021-01-09 DIAGNOSIS — K859 Acute pancreatitis without necrosis or infection, unspecified: Secondary | ICD-10-CM | POA: Insufficient documentation

## 2021-01-09 DIAGNOSIS — K5900 Constipation, unspecified: Secondary | ICD-10-CM | POA: Diagnosis not present

## 2021-01-09 DIAGNOSIS — Z823 Family history of stroke: Secondary | ICD-10-CM | POA: Insufficient documentation

## 2021-01-09 DIAGNOSIS — C7931 Secondary malignant neoplasm of brain: Secondary | ICD-10-CM | POA: Insufficient documentation

## 2021-01-09 DIAGNOSIS — Z8719 Personal history of other diseases of the digestive system: Secondary | ICD-10-CM | POA: Insufficient documentation

## 2021-01-09 DIAGNOSIS — E860 Dehydration: Secondary | ICD-10-CM

## 2021-01-09 DIAGNOSIS — C7951 Secondary malignant neoplasm of bone: Secondary | ICD-10-CM | POA: Diagnosis not present

## 2021-01-09 DIAGNOSIS — C7972 Secondary malignant neoplasm of left adrenal gland: Secondary | ICD-10-CM | POA: Insufficient documentation

## 2021-01-09 DIAGNOSIS — C3411 Malignant neoplasm of upper lobe, right bronchus or lung: Secondary | ICD-10-CM

## 2021-01-09 DIAGNOSIS — C787 Secondary malignant neoplasm of liver and intrahepatic bile duct: Secondary | ICD-10-CM | POA: Insufficient documentation

## 2021-01-09 DIAGNOSIS — R531 Weakness: Secondary | ICD-10-CM | POA: Diagnosis not present

## 2021-01-09 DIAGNOSIS — R5383 Other fatigue: Secondary | ICD-10-CM | POA: Diagnosis not present

## 2021-01-09 DIAGNOSIS — K853 Drug induced acute pancreatitis without necrosis or infection: Secondary | ICD-10-CM

## 2021-01-09 DIAGNOSIS — R066 Hiccough: Secondary | ICD-10-CM | POA: Diagnosis not present

## 2021-01-09 DIAGNOSIS — Z818 Family history of other mental and behavioral disorders: Secondary | ICD-10-CM | POA: Diagnosis not present

## 2021-01-09 DIAGNOSIS — R109 Unspecified abdominal pain: Secondary | ICD-10-CM | POA: Diagnosis not present

## 2021-01-09 LAB — CBC WITH DIFFERENTIAL/PLATELET
Abs Immature Granulocytes: 0.07 10*3/uL (ref 0.00–0.07)
Basophils Absolute: 0 10*3/uL (ref 0.0–0.1)
Basophils Relative: 0 %
Eosinophils Absolute: 0 10*3/uL (ref 0.0–0.5)
Eosinophils Relative: 0 %
HCT: 48.1 % (ref 39.0–52.0)
Hemoglobin: 16.1 g/dL (ref 13.0–17.0)
Immature Granulocytes: 1 %
Lymphocytes Relative: 10 %
Lymphs Abs: 1.5 10*3/uL (ref 0.7–4.0)
MCH: 33.3 pg (ref 26.0–34.0)
MCHC: 33.5 g/dL (ref 30.0–36.0)
MCV: 99.4 fL (ref 80.0–100.0)
Monocytes Absolute: 1 10*3/uL (ref 0.1–1.0)
Monocytes Relative: 7 %
Neutro Abs: 12.7 10*3/uL — ABNORMAL HIGH (ref 1.7–7.7)
Neutrophils Relative %: 82 %
Platelets: 227 10*3/uL (ref 150–400)
RBC: 4.84 MIL/uL (ref 4.22–5.81)
RDW: 14.1 % (ref 11.5–15.5)
WBC: 15.3 10*3/uL — ABNORMAL HIGH (ref 4.0–10.5)
nRBC: 0 % (ref 0.0–0.2)

## 2021-01-09 LAB — COMPREHENSIVE METABOLIC PANEL
ALT: 22 U/L (ref 0–44)
AST: 32 U/L (ref 15–41)
Albumin: 3.7 g/dL (ref 3.5–5.0)
Alkaline Phosphatase: 52 U/L (ref 38–126)
Anion gap: 9 (ref 5–15)
BUN: 18 mg/dL (ref 8–23)
CO2: 27 mmol/L (ref 22–32)
Calcium: 8.3 mg/dL — ABNORMAL LOW (ref 8.9–10.3)
Chloride: 96 mmol/L — ABNORMAL LOW (ref 98–111)
Creatinine, Ser: 1.12 mg/dL (ref 0.61–1.24)
GFR, Estimated: >60 mL/min (ref >60)
Glucose, Bld: 134 mg/dL — ABNORMAL HIGH (ref 70–99)
Potassium: 4.2 mmol/L (ref 3.5–5.1)
Sodium: 132 mmol/L — ABNORMAL LOW (ref 135–145)
Total Bilirubin: 1 mg/dL (ref 0.3–1.2)
Total Protein: 7.6 g/dL (ref 6.5–8.1)

## 2021-01-09 LAB — AMYLASE: Amylase: 289 U/L — ABNORMAL HIGH (ref 28–100)

## 2021-01-09 LAB — LIPASE, BLOOD: Lipase: 73 U/L — ABNORMAL HIGH (ref 11–51)

## 2021-01-09 MED ORDER — SODIUM CHLORIDE 0.9 % IV SOLN
INTRAVENOUS | Status: DC
  Filled 2021-01-09 (×2): qty 250

## 2021-01-09 NOTE — Progress Notes (Signed)
Symptom Management [place]  [phone] [phone]  Patient Care Team: [name], MD as PCP - General (Family Medicine) [name], MD as Consulting Physician (Family Medicine) [name], [name], MD (General Surgery) Thornton Park, DO as Consulting Physician (Radiation Oncology)   Name of the patient: Curtis Andrade Born  [id number]  12-25-1945   Date of visit: 01/09/21  Diagnosis- Lung Cancer  Chief complaint/ Reason for visit- Pancreatitis  Heme/Onc history:  Oncology History Overview Note  # OCT 2018- STAGE I- A. 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  - PARENCHYMAL MARGIN APPEARS CLEAR; TUMOR WAS 1 CM FROM MARGIN BEFORE REMOVAL OF STAPLE LINE.; NO adjuvant therapy.   # DEC 2021-- METASTATIC CARCINOMA, COMPATIBLE WITH PULMONARY ADENOCARCINOMA [left shoulder Core Biopsy];  NOV 24th PET-right hilar and Hypermetabolic mediastinal lymphadenopathy; small pulmonary nodules; 2 metastatic lesions in the liver/  pancreatic head/left adrenal gland;  muscle and skeletal metastasis.  Subcutaneous nodule-positive for metastatic adeno lung.   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  Short course of Eliquis.smoking-quit 2008.  ---------------------------------------    DIAGNOSIS: LUNG CA  STAGE:  IV      ;GOALS:palliation  CURRENT/MOST RECENT THERAPY: DAB+TREM    Primary cancer of right upper lobe of lung ([place])  04/09/2020 Cancer Staging   Staging form: Lung, AJCC 8th Edition - Clinical: Stage IVB  (cN1, cM1c) - Signed by Cammie Sickle, MD on 04/09/2020     Interval history- patient is 75 year old male diagnosed with lung cancer currently receiving dab&trem who presents to symptom management clinic for pancreatitis.  He developed right upper quadrant pain on 01/07/2021 and was seen in [facility] ER.  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  Hospitalization was offered for pain control and consideration of stent however, patient preferred to go home.  He presents today for further management and evaluation.  Pain has improved but persists.  Eating soft foods.  No vomiting.  Complains of constipation that is mild.  No chest pain, shortness of breath.  No urinary complaints.  No further specific complaints today.  No fevers or chills.  Review of systems- Review of Systems  Constitutional:  Positive for malaise/fatigue and weight loss. Negative for chills and fever.  HENT:  Negative for hearing loss, nosebleeds, sore throat and tinnitus.   Eyes:  Negative for blurred vision and double vision.  Respiratory:  Negative for cough, hemoptysis, shortness of breath and wheezing.   Cardiovascular:  Negative for chest pain, palpitations and leg swelling.  Gastrointestinal:  Positive for abdominal pain and constipation. Negative for blood in stool, diarrhea, melena, nausea and vomiting.  Genitourinary:  Negative for dysuria and urgency.  Musculoskeletal:  Negative for back pain, falls, joint pain and myalgias.  Skin:  Negative for itching and rash.  Neurological:  Positive for weakness. Negative for dizziness, tingling, sensory change, loss of consciousness and headaches.  Endo/Heme/Allergies:  Negative for environmental allergies. Does not bruise/bleed easily.  Psychiatric/Behavioral:  Negative for depression. The patient is not  nervous/anxious and does not have insomnia.     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 Baruch Gouty and Dr. Nadeen Landau   Phlebitis 1998   Primary cancer of right upper lobe of lung ([place]) 01/14/2017   Dr. Genevive Bi performed a wedge resection of RUL.    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/27/2016    Past Surgical History:  Procedure Laterality Date   COLONOSCOPY  2013   Dr Vira Agar   COLONOSCOPY WITH PROPOFOL N/A 12/23/2016   Procedure: COLONOSCOPY WITH PROPOFOL;  Surgeon: Robert Bellow, MD;  Location: [facility] ENDOSCOPY;  Service: Endoscopy;  Laterality: N/A;   EYE SURGERY     HERNIA REPAIR Right 1990's   TENDON REPAIR Left 03/14/2019   Procedure: TENDON REPAIR AND GRAFT  TRANSFER EXTENSOR INDICIS PROPRIUS LEFT THUMB;  Surgeon: [name], MD;  Location: [place];  Service: Orthopedics;  Laterality: Left;  AXILLARY BLOCK   THORACOTOMY Right 01/14/2017   Procedure: RIGHT THORACOSCOPYWITH WIDE WEDGE RESECTION,  PREOP BROCHOSCOPY;  Surgeon: [name], MD;  Location: [facility] ORS;  Service: General;  Laterality: Right;   THROAT SURGERY  2010    Social History   Socioeconomic History   Marital status: Married    Spouse name: Not on file   Number of children: Not on file   Years of education: Not on file   Highest education level: Not on file  Occupational History   Not on file  Tobacco Use   Smoking status: Former    Years: 50.00    Types: Cigarettes    Quit date: 03/31/2007    Years since quitting: 13.7   Smokeless tobacco: Never  Vaping Use   Vaping Use: Never used  Substance and Sexual Activity   Alcohol use: No   Drug use: No   Sexual activity: Yes    Birth control/protection: None    Comment: Married  Other Topics  Concern   Not on file  Social History Narrative   Not on file   Social Determinants of Health   Financial Resource Strain: Not on file  Food Insecurity: Not on file  Transportation Needs: Not on file  Physical Activity: Not on file  Stress: Not on file  Social Connections: Not on file  Intimate Partner Violence: Not on file    Family History  Problem Relation Age of Onset   Alzheimer's disease Mother    Alzheimer's disease Father    Stroke Father    Colon cancer Neg Hx      Current Outpatient Medications:    Ascorbic Acid (VITAMIN C) 1000 MG tablet, Take 1,000 mg by mouth daily., Disp: , Rfl:    aspirin EC 81 MG tablet, Take 81 mg by mouth daily., Disp: , Rfl:    Calcium Carbonate-Vit D-Min (CALCIUM 1200 PO), Take 1 tablet by mouth daily., Disp: , Rfl:    dabrafenib mesylate (TAFINLAR) 75 MG capsule, Take 2 capsules (150 mg total) by mouth 2 (two) times daily. Take on an empty stomach 1 hour before or 2 hours after meals., Disp: 120 capsule, Rfl: 3   folic acid (FOLVITE) 762 MCG tablet, Take 800 mcg by mouth daily., Disp: , Rfl:    Glucosamine-Chondroitin (COSAMIN DS PO), Take 1 tablet  by mouth daily., Disp: , Rfl:    Multiple Vitamin (MULTIVITAMIN WITH MINERALS) TABS tablet, Take 1 tablet by mouth daily. Senior Multivitamin., Disp: , Rfl:    Multiple Vitamins-Minerals (EMERGEN-C IMMUNE PO), Take 1 tablet by mouth 2 (two) times daily., Disp: , Rfl:    niacinamide 500 MG tablet, Take 500 mg by mouth 2 (two) times daily with a meal., Disp: , Rfl:    Omega-3 1000 MG CAPS, Take 1 tablet by mouth 1 day or 1 dose., Disp: , Rfl:    omeprazole (PRILOSEC) 40 MG capsule, Take 40 mg by mouth daily before supper. , Disp: , Rfl:    senna (SENOKOT) 8.6 MG tablet, Take 1 tablet by mouth daily., Disp: , Rfl:    trametinib dimethyl sulfoxide (MEKINIST) 2 MG tablet, Take 1 tablet (2 mg total) by mouth daily. Take 1 hour before or 2 hours after a meal. Store refrigerated in original container.,  Disp: 30 tablet, Rfl: 3  Physical exam:  Vitals:   01/09/21 0934  BP: 123/81  Pulse: 91  Resp: 18  SpO2: 99%  Weight: 197 lb (89.4 kg)   Physical Exam Constitutional:      General: He is not in acute distress.    Appearance: He is well-developed. He is not ill-appearing.  HENT:     Head: Normocephalic and atraumatic.     Nose: Nose normal.     Mouth/Throat:     Pharynx: No oropharyngeal exudate.  Eyes:     General: No scleral icterus.    Conjunctiva/sclera: Conjunctivae normal.  Cardiovascular:     Rate and Rhythm: Normal rate and regular rhythm.     Heart sounds: Normal heart sounds.  Pulmonary:     Effort: Pulmonary effort is normal.     Breath sounds: Normal breath sounds. No wheezing.  Abdominal:     General: Bowel sounds are normal. There is no distension.     Palpations: Abdomen is soft. There is no mass.     Tenderness: There is abdominal tenderness. There is no guarding or rebound.  Musculoskeletal:        General: Normal range of motion.     Cervical back: Normal range of motion and neck supple.  Skin:    General: Skin is warm and dry.     Coloration: Skin is not jaundiced or pale.  Neurological:     Mental Status: He is alert and oriented to person, place, and time.     Motor: No weakness.  Psychiatric:        Mood and Affect: Mood normal.        Behavior: Behavior normal.     CMP Latest Ref Rng & Units 01/09/2021  Glucose 70 - 99 mg/dL 134(H)  BUN 8 - 23 mg/dL 18  Creatinine 0.61 - 1.24 mg/dL 1.12  Sodium 135 - 145 mmol/L 132  Potassium 3.5 - 5.1 mmol/L 4.2  Chloride 98 - 111 mmol/L 96 (L)  CO2 22 - 32 mmol/L 27  Calcium 8.9 - 10.3 mg/dL 8.3 (L)  Total Protein 6.5 - 8.1 g/dL 7.6  Total Bilirubin 0.3 - 1.2 mg/dL 1.0  Alkaline Phos 38 - 126 U/L 52  AST 15 - 41 U/L 32  ALT 0 - 44 U/L 22   CBC Latest Ref Rng & Units 01/09/2021  WBC 4.0 - 10.5 K/uL 15.3(H)  Hemoglobin 13.0 - 17.0 g/dL 16.1  Hematocrit 39.0 - 52.0 % 48.1  Platelets 150 - 400 K/uL  227   Amylase-  289 (high) Lipase- 72 (high)  I reviewed report of imaging from [facility] ER and have requested images to be powershared.   No results found.  Assessment and plan- Patient is a 75 y.o. male currently on dabrafenib and trametinib for lung cancer who presents for ER follow up for acute pancreatitis. Hold treatment as this can be an uncommon side effect of dabrafenib (< 10%). IV fluids today, pain medication as needed, and rest bowels. I've recommended clear liquid diet. Clinically he is improving. ER precautions reviewed. Dr. Rogue Bussing independently evaluated this patient and contributed to plan of care as well.  Will plan to see him back first of next week with recheck of lipase and amylase, cbc, cmp, at that time. If pain worsens, I've recommended he go to ER.    Visit Diagnosis No diagnosis found.  Patient expressed understanding and was in agreement with this plan. He also understands that He can call clinic at any time with any questions, concerns, or complaints.   Thank you for allowing me to participate in the care of this very pleasant patient.   Beckey Rutter, DNP, AGNP-C [place] at [place]

## 2021-01-09 NOTE — Progress Notes (Signed)
Patient discharge after 1 liter of IV fluids. States that he feels much better. Pt given apts for next Monday to see Lauren.  Per NP- she will call him with lab results- since lab results are still pending.

## 2021-01-09 NOTE — Progress Notes (Signed)
Patient here for oncology follow-up appointment, expresses concerns of decreased appetite, constipation, fatigue, and dizziness

## 2021-01-11 ENCOUNTER — Telehealth: Payer: Self-pay | Admitting: Oncology

## 2021-01-11 ENCOUNTER — Other Ambulatory Visit: Payer: Self-pay | Admitting: Oncology

## 2021-01-11 MED ORDER — CHLORPROMAZINE HCL 10 MG PO TABS: 10.0000 mg | ORAL_TABLET | Freq: Three times a day (TID) | ORAL | 0 refills | Status: DC

## 2021-01-11 NOTE — Telephone Encounter (Signed)
Intractable hiccups for 3 days.  Thorazine 10mg  TID Rx was sent to pharmacy. Side effects were discussed.

## 2021-01-13 ENCOUNTER — Inpatient Hospital Stay: Payer: PPO | Admitting: Nurse Practitioner

## 2021-01-13 ENCOUNTER — Inpatient Hospital Stay: Payer: PPO

## 2021-01-13 ENCOUNTER — Other Ambulatory Visit: Payer: Self-pay

## 2021-01-13 ENCOUNTER — Inpatient Hospital Stay (HOSPITAL_BASED_OUTPATIENT_CLINIC_OR_DEPARTMENT_OTHER): Payer: PPO | Admitting: Nurse Practitioner

## 2021-01-13 VITALS — BP 121/68 | HR 73 | Temp 98.6°F | Resp 18 | Wt 196.0 lb

## 2021-01-13 DIAGNOSIS — C3411 Malignant neoplasm of upper lobe, right bronchus or lung: Secondary | ICD-10-CM

## 2021-01-13 DIAGNOSIS — K5900 Constipation, unspecified: Secondary | ICD-10-CM | POA: Diagnosis not present

## 2021-01-13 DIAGNOSIS — K853 Drug induced acute pancreatitis without necrosis or infection: Secondary | ICD-10-CM | POA: Diagnosis not present

## 2021-01-13 DIAGNOSIS — K769 Liver disease, unspecified: Secondary | ICD-10-CM

## 2021-01-13 DIAGNOSIS — R066 Hiccough: Secondary | ICD-10-CM

## 2021-01-13 DIAGNOSIS — E860 Dehydration: Secondary | ICD-10-CM

## 2021-01-13 LAB — LIPASE, BLOOD: Lipase: 28 U/L (ref 11–51)

## 2021-01-13 LAB — CBC WITH DIFFERENTIAL/PLATELET
Abs Immature Granulocytes: 0.07 10*3/uL (ref 0.00–0.07)
Basophils Absolute: 0 10*3/uL (ref 0.0–0.1)
Basophils Relative: 0 %
Eosinophils Absolute: 0.1 10*3/uL (ref 0.0–0.5)
Eosinophils Relative: 1 %
HCT: 40.1 % (ref 39.0–52.0)
Hemoglobin: 13.7 g/dL (ref 13.0–17.0)
Immature Granulocytes: 1 %
Lymphocytes Relative: 10 %
Lymphs Abs: 1.1 10*3/uL (ref 0.7–4.0)
MCH: 33.2 pg (ref 26.0–34.0)
MCHC: 34.2 g/dL (ref 30.0–36.0)
MCV: 97.1 fL (ref 80.0–100.0)
Monocytes Absolute: 0.9 10*3/uL (ref 0.1–1.0)
Monocytes Relative: 9 %
Neutro Abs: 8.5 10*3/uL — ABNORMAL HIGH (ref 1.7–7.7)
Neutrophils Relative %: 79 %
Platelets: 210 10*3/uL (ref 150–400)
RBC: 4.13 MIL/uL — ABNORMAL LOW (ref 4.22–5.81)
RDW: 14.1 % (ref 11.5–15.5)
WBC: 10.7 10*3/uL — ABNORMAL HIGH (ref 4.0–10.5)
nRBC: 0 % (ref 0.0–0.2)

## 2021-01-13 LAB — COMPREHENSIVE METABOLIC PANEL
ALT: 15 U/L (ref 0–44)
AST: 21 U/L (ref 15–41)
Albumin: 3.3 g/dL — ABNORMAL LOW (ref 3.5–5.0)
Alkaline Phosphatase: 84 U/L (ref 38–126)
Anion gap: 9 (ref 5–15)
BUN: 11 mg/dL (ref 8–23)
CO2: 25 mmol/L (ref 22–32)
Calcium: 7.2 mg/dL — ABNORMAL LOW (ref 8.9–10.3)
Chloride: 97 mmol/L — ABNORMAL LOW (ref 98–111)
Creatinine, Ser: 0.96 mg/dL (ref 0.61–1.24)
GFR, Estimated: >60 mL/min (ref >60)
Glucose, Bld: 120 mg/dL — ABNORMAL HIGH (ref 70–99)
Potassium: 3.4 mmol/L — ABNORMAL LOW (ref 3.5–5.1)
Sodium: 131 mmol/L — ABNORMAL LOW (ref 135–145)
Total Bilirubin: 0.9 mg/dL (ref 0.3–1.2)
Total Protein: 7.2 g/dL (ref 6.5–8.1)

## 2021-01-13 LAB — AMYLASE: Amylase: 44 U/L (ref 28–100)

## 2021-01-13 MED ORDER — SODIUM CHLORIDE 0.9 % IV SOLN
Freq: Once | INTRAVENOUS | Status: AC
  Filled 2021-01-13: qty 250

## 2021-01-13 MED ORDER — GABAPENTIN 100 MG PO CAPS: 100.0000 mg | ORAL_CAPSULE | Freq: Three times a day (TID) | ORAL | 0 refills | Status: DC | PRN

## 2021-01-13 NOTE — Progress Notes (Signed)
Symptom Management [place]  [phone] [phone]  Patient Care Team: [name], MD as PCP - General (Family Medicine) [name], MD as Consulting Physician (Family Medicine) [name], [name], MD (General Surgery) Thornton Park, DO as Consulting Physician (Radiation Oncology)   Name of the patient: Curtis Andrade  [id number]  June 13, 1945   Date of visit: 01/13/21  Diagnosis- Lung Cancer  Chief complaint/ Reason for visit- hiccups  Heme/Onc history:  Oncology History Overview Note  # OCT 2018- STAGE I- A. 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  - PARENCHYMAL MARGIN APPEARS CLEAR; TUMOR WAS 1 CM FROM MARGIN BEFORE REMOVAL OF STAPLE LINE.; NO adjuvant therapy.   # DEC 2021-- METASTATIC CARCINOMA, COMPATIBLE WITH PULMONARY ADENOCARCINOMA [left shoulder Core Biopsy];  NOV 24th PET-right hilar and Hypermetabolic mediastinal lymphadenopathy; small pulmonary nodules; 2 metastatic lesions in the liver/  pancreatic head/left adrenal gland;  muscle and skeletal metastasis.  Subcutaneous nodule-positive for metastatic adeno lung.   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  Short course of Eliquis.smoking-quit 2008.  ---------------------------------------    DIAGNOSIS: LUNG CA  STAGE:  IV      ;GOALS:palliation  CURRENT/MOST RECENT THERAPY: DAB+TREM    Primary cancer of right upper lobe of lung ([place])  04/09/2020 Cancer Staging   Staging form: Lung, AJCC 8th Edition - Clinical: Stage IVB (cN1,  cM1c) - Signed by Cammie Sickle, MD on 04/09/2020    Interval history- Patient is 75 year old male receiving dab + trem for lung cancer metastatic to bone, subcutaneous nodules, liver with brain metastases, who returns to clinic for follow up for pancreatitis and new hiccups. He has been holding chemo pill. Abdominal pain has resolved. He's been sipping clear liquids. Took a dose of miralax yesterday without effect. Has tried thorazine for hiccups without improvement. Overall feels better. Fatigued since he is not sleeping with hiccups.   Review of systems- Review of Systems  Constitutional:  Positive for malaise/fatigue and weight loss. Negative for chills and fever.  HENT:  Negative for hearing loss, nosebleeds, sore throat and tinnitus.   Eyes:  Negative for blurred vision and double vision.  Respiratory:  Negative for cough, hemoptysis, shortness of breath and wheezing.   Cardiovascular:  Negative for chest pain, palpitations and leg swelling.  Gastrointestinal:  Negative for abdominal pain, blood in stool, constipation, diarrhea, melena, nausea and vomiting.  Genitourinary:  Negative for dysuria and urgency.  Musculoskeletal:  Negative for back pain, falls, joint pain and myalgias.  Skin:  Negative for itching and rash.  Neurological:  Negative for dizziness, tingling, sensory change, loss of consciousness, weakness and headaches.  Endo/Heme/Allergies:  Negative for environmental allergies. Does not bruise/bleed easily.  Psychiatric/Behavioral:  Negative for depression. The patient is not nervous/anxious and does not have insomnia.     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 Baruch Gouty and Dr. Nadeen Landau   Phlebitis 1998   Primary  cancer of right upper lobe of lung ([place]) 01/14/2017   Dr. Genevive Bi performed a wedge resection of RUL.    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/27/2016    Past Surgical History:  Procedure Laterality Date   COLONOSCOPY  2013   Dr Vira Agar   COLONOSCOPY WITH PROPOFOL N/A 12/23/2016   Procedure: COLONOSCOPY WITH PROPOFOL;  Surgeon: Robert Bellow, MD;  Location: [facility] ENDOSCOPY;  Service: Endoscopy;  Laterality: N/A;   EYE SURGERY     HERNIA REPAIR Right 1990's   TENDON REPAIR Left 03/14/2019   Procedure: TENDON REPAIR AND GRAFT  TRANSFER EXTENSOR INDICIS PROPRIUS LEFT THUMB;  Surgeon: [name], MD;  Location: [place];  Service: Orthopedics;  Laterality: Left;  AXILLARY BLOCK   THORACOTOMY Right 01/14/2017   Procedure: RIGHT THORACOSCOPYWITH WIDE WEDGE RESECTION,  PREOP BROCHOSCOPY;  Surgeon: [name], MD;  Location: [facility] ORS;  Service: General;  Laterality: Right;   THROAT SURGERY  2010    Social History   Socioeconomic History   Marital status: Married    Spouse name: Not on file   Number of children: Not on file   Years of education: Not on file   Highest education level: Not on file  Occupational History   Not on file  Tobacco Use   Smoking status: Former    Years: 50.00    Types: Cigarettes    Quit date: 03/31/2007    Years since quitting: 13.8   Smokeless tobacco: Never  Vaping Use   Vaping Use: Never used  Substance and Sexual Activity   Alcohol use: No   Drug use: No   Sexual activity: Yes    Birth control/protection: None    Comment: Married  Other Topics Concern   Not on file  Social History Narrative   Not on file   Social Determinants of Health   Financial Resource Strain: Not on file  Food Insecurity: Not on file  Transportation Needs: Not on file  Physical Activity: Not on file  Stress: Not on file  Social Connections: Not on file  Intimate Partner Violence: Not on file    Family History  Problem Relation Age of Onset   Alzheimer's  disease Mother    Alzheimer's disease Father    Stroke Father    Colon cancer Neg Hx     Current Outpatient Medications:    chlorproMAZINE (THORAZINE) 10 MG tablet, Take 1 tablet (10 mg total) by mouth 3 (three) times daily., Disp: 15 tablet, Rfl: 0   omeprazole (PRILOSEC) 40 MG capsule, Take by mouth., Disp: , Rfl:    Ascorbic Acid (VITAMIN C) 1000 MG tablet, Take 1,000 mg by mouth daily. (Patient not taking: Reported on 01/13/2021), Disp: , Rfl:    aspirin EC 81 MG tablet, Take 81 mg by mouth daily. (Patient not taking: Reported on 01/13/2021), Disp: , Rfl:    Calcium Carbonate-Vit D-Min (CALCIUM 1200 PO), Take 1 tablet by mouth daily. (Patient not taking: Reported on 01/13/2021), Disp: , Rfl:    dabrafenib mesylate (TAFINLAR) 75 MG capsule, Take 2 capsules (150 mg total) by mouth 2 (two) times daily. Take on an empty stomach 1 hour before or 2 hours after meals. (Patient not taking: Reported on 01/13/2021), Disp: 120 capsule, Rfl: 3   folic acid (FOLVITE) 889 MCG tablet, Take 800 mcg by mouth daily. (Patient not taking: Reported on 01/13/2021), Disp: , Rfl:    Glucosamine-Chondroitin (COSAMIN  DS PO), Take 1 tablet by mouth daily. (Patient not taking: Reported on 01/13/2021), Disp: , Rfl:    Multiple Vitamin (MULTIVITAMIN WITH MINERALS) TABS tablet, Take 1 tablet by mouth daily. Senior Multivitamin. (Patient not taking: Reported on 01/13/2021), Disp: , Rfl:    Multiple Vitamins-Minerals (EMERGEN-C IMMUNE PO), Take 1 tablet by mouth 2 (two) times daily. (Patient not taking: Reported on 01/13/2021), Disp: , Rfl:    niacinamide 500 MG tablet, Take 500 mg by mouth 2 (two) times daily with a meal. (Patient not taking: Reported on 01/13/2021), Disp: , Rfl:    Omega-3 1000 MG CAPS, Take 1 tablet by mouth 1 day or 1 dose. (Patient not taking: Reported on 01/13/2021), Disp: , Rfl:    senna (SENOKOT) 8.6 MG tablet, Take 1 tablet by mouth daily. (Patient not taking: Reported on 01/13/2021), Disp: , Rfl:     trametinib dimethyl sulfoxide (MEKINIST) 2 MG tablet, Take 1 tablet (2 mg total) by mouth daily. Take 1 hour before or 2 hours after a meal. Store refrigerated in original container. (Patient not taking: Reported on 01/13/2021), Disp: 30 tablet, Rfl: 3 No current facility-administered medications for this visit.  Facility-Administered Medications Ordered in Other Visits:    0.9 %  sodium chloride infusion, , Intravenous, Once, Verlon Au, NP, Last Rate: 999 mL/hr at 01/13/21 1356, New Bag at 01/13/21 1356  Physical exam:  Vitals:   01/13/21 1320  BP: 121/68  Pulse: 73  Resp: 18  Temp: 98.6 F (37 C)  TempSrc: Tympanic  SpO2: 95%  Weight: 196 lb (88.9 kg)   Physical Exam Constitutional:      General: He is not in acute distress.    Appearance: He is well-developed. He is not ill-appearing.     Comments: Accompanied by wife  HENT:     Head: Normocephalic and atraumatic.     Nose: Nose normal.     Mouth/Throat:     Pharynx: No oropharyngeal exudate.     Comments: hiccups Eyes:     General: No scleral icterus.    Conjunctiva/sclera: Conjunctivae normal.  Cardiovascular:     Rate and Rhythm: Normal rate and regular rhythm.     Heart sounds: Normal heart sounds.  Pulmonary:     Effort: Pulmonary effort is normal.     Breath sounds: Normal breath sounds. No wheezing.  Abdominal:     General: Bowel sounds are normal. There is no distension.     Palpations: Abdomen is soft.     Tenderness: There is no abdominal tenderness. There is no guarding or rebound.  Musculoskeletal:        General: Normal range of motion.     Cervical back: Normal range of motion and neck supple.  Skin:    General: Skin is warm and dry.  Neurological:     Mental Status: He is alert and oriented to person, place, and time.     Motor: No weakness.  Psychiatric:        Behavior: Behavior normal.     CMP Latest Ref Rng & Units 01/13/2021  Glucose 70 - 99 mg/dL 120(H)  BUN 8 - 23 mg/dL 11   Creatinine 0.61 - 1.24 mg/dL 0.96  Sodium 135 - 145 mmol/L 131(L)  Potassium 3.5 - 5.1 mmol/L 3.4(L)  Chloride 98 - 111 mmol/L 97(L)  CO2 22 - 32 mmol/L 25  Calcium 8.9 - 10.3 mg/dL 7.2(L)  Total Protein 6.5 - 8.1 g/dL 7.2  Total Bilirubin 0.3 - 1.2 mg/dL  0.9  Alkaline Phos 38 - 126 U/L 84  AST 15 - 41 U/L 21  ALT 0 - 44 U/L 15   CBC Latest Ref Rng & Units 01/13/2021  WBC 4.0 - 10.5 K/uL 10.7(H)  Hemoglobin 13.0 - 17.0 g/dL 13.7  Hematocrit 39.0 - 52.0 % 40.1  Platelets 150 - 400 K/uL 210    No images are attached to the encounter.  No results found.  Assessment and plan- Patient is a 75 y.o. 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 Currently held d/t recent pancreatitis. Pancreatitis has now resolved. Ok to progress diet. Labs improving. Clinically resolved. Bland foods, soft foods, as tolerated. Can continue to increase over the week. No IV fluids today. He will see Dr. Rogue Bussing to discuss other treatment options on 10/26 with albs at that time.   Hiccups- refractory to thorazine. Likely related to pancreatitis. Start gabapentin for possible nerve component. 100 mg as needed. May up titrate if needed/tolerated. Continue thorazine.   Constipation- increase miralax to 1-4 times a day to achieve effect. Add senna 1-2 tablets a day 1-3 times a day as needed. Encouraged fiber and fluids.   I updated Dr. Rogue Bussing and he agreed with plan of care.    Visit Diagnosis 1. Drug-induced acute pancreatitis without infection or necrosis   2. Hiccups   3. Constipation, unspecified constipation type     Patient expressed understanding and was in agreement with this plan. He also understands that He can call clinic at any time with any questions, concerns, or complaints.   Thank you for allowing me to participate in the care of this very pleasant patient.   Beckey Rutter, DNP, AGNP-C [place] at [place]

## 2021-01-13 NOTE — Progress Notes (Signed)
Having esophageal spasming  and hiccups from pancreatitis. Eating soups and broths. No N/V. Denies actual pain only epigastric discomfort. Has constipation. Took a stool softener the other day with no effect.

## 2021-01-14 ENCOUNTER — Encounter: Payer: Self-pay | Admitting: *Deleted

## 2021-01-16 ENCOUNTER — Other Ambulatory Visit: Payer: Self-pay | Admitting: *Deleted

## 2021-01-16 DIAGNOSIS — E860 Dehydration: Secondary | ICD-10-CM

## 2021-01-16 DIAGNOSIS — K859 Acute pancreatitis without necrosis or infection, unspecified: Secondary | ICD-10-CM

## 2021-01-16 DIAGNOSIS — C3411 Malignant neoplasm of upper lobe, right bronchus or lung: Secondary | ICD-10-CM

## 2021-01-22 ENCOUNTER — Inpatient Hospital Stay (HOSPITAL_BASED_OUTPATIENT_CLINIC_OR_DEPARTMENT_OTHER): Payer: PPO | Admitting: Internal Medicine

## 2021-01-22 ENCOUNTER — Inpatient Hospital Stay: Payer: PPO | Admitting: Pharmacist

## 2021-01-22 ENCOUNTER — Other Ambulatory Visit: Payer: Self-pay

## 2021-01-22 ENCOUNTER — Inpatient Hospital Stay: Payer: PPO

## 2021-01-22 ENCOUNTER — Telehealth: Payer: Self-pay | Admitting: Pharmacy Technician

## 2021-01-22 ENCOUNTER — Other Ambulatory Visit (HOSPITAL_COMMUNITY): Payer: Self-pay

## 2021-01-22 VITALS — BP 150/73 | HR 66 | Temp 97.8°F | Resp 18 | Wt 194.9 lb

## 2021-01-22 DIAGNOSIS — C3411 Malignant neoplasm of upper lobe, right bronchus or lung: Secondary | ICD-10-CM

## 2021-01-22 DIAGNOSIS — K859 Acute pancreatitis without necrosis or infection, unspecified: Secondary | ICD-10-CM

## 2021-01-22 DIAGNOSIS — E860 Dehydration: Secondary | ICD-10-CM

## 2021-01-22 LAB — COMPREHENSIVE METABOLIC PANEL
ALT: 15 U/L (ref 0–44)
AST: 24 U/L (ref 15–41)
Albumin: 3.4 g/dL — ABNORMAL LOW (ref 3.5–5.0)
Alkaline Phosphatase: 65 U/L (ref 38–126)
Anion gap: 7 (ref 5–15)
BUN: 8 mg/dL (ref 8–23)
CO2: 29 mmol/L (ref 22–32)
Calcium: 9 mg/dL (ref 8.9–10.3)
Chloride: 100 mmol/L (ref 98–111)
Creatinine, Ser: 1.15 mg/dL (ref 0.61–1.24)
GFR, Estimated: >60 mL/min (ref >60)
Glucose, Bld: 118 mg/dL — ABNORMAL HIGH (ref 70–99)
Potassium: 4.9 mmol/L (ref 3.5–5.1)
Sodium: 136 mmol/L (ref 135–145)
Total Bilirubin: 0.5 mg/dL (ref 0.3–1.2)
Total Protein: 7.2 g/dL (ref 6.5–8.1)

## 2021-01-22 LAB — CBC WITH DIFFERENTIAL/PLATELET
Abs Immature Granulocytes: 0.04 10*3/uL (ref 0.00–0.07)
Basophils Absolute: 0.1 10*3/uL (ref 0.0–0.1)
Basophils Relative: 1 %
Eosinophils Absolute: 0.2 10*3/uL (ref 0.0–0.5)
Eosinophils Relative: 3 %
HCT: 41.2 % (ref 39.0–52.0)
Hemoglobin: 13.4 g/dL (ref 13.0–17.0)
Immature Granulocytes: 1 %
Lymphocytes Relative: 24 %
Lymphs Abs: 1.3 10*3/uL (ref 0.7–4.0)
MCH: 33.1 pg (ref 26.0–34.0)
MCHC: 32.5 g/dL (ref 30.0–36.0)
MCV: 101.7 fL — ABNORMAL HIGH (ref 80.0–100.0)
Monocytes Absolute: 0.5 10*3/uL (ref 0.1–1.0)
Monocytes Relative: 8 %
Neutro Abs: 3.6 10*3/uL (ref 1.7–7.7)
Neutrophils Relative %: 63 %
Platelets: 305 10*3/uL (ref 150–400)
RBC: 4.05 MIL/uL — ABNORMAL LOW (ref 4.22–5.81)
RDW: 14 % (ref 11.5–15.5)
WBC: 5.7 10*3/uL (ref 4.0–10.5)
nRBC: 0 % (ref 0.0–0.2)

## 2021-01-22 LAB — LIPASE, BLOOD: Lipase: 28 U/L (ref 11–51)

## 2021-01-22 LAB — AMYLASE: Amylase: 46 U/L (ref 28–100)

## 2021-01-22 NOTE — Progress Notes (Signed)
[place] NOTE  Patient Care Team: [name], MD as PCP - General (Family Medicine) [name], MD as Consulting Physician (Family Medicine) [name], [name], MD (General Surgery) [place] Park, DO as Consulting Physician (Radiation Oncology)  CHIEF COMPLAINTS/PURPOSE OF CONSULTATION: Lung cancer  #  Oncology History Overview Note  # OCT 2018- STAGE I- A. 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  - PARENCHYMAL MARGIN APPEARS CLEAR; TUMOR WAS 1 CM FROM MARGIN BEFORE REMOVAL OF STAPLE LINE.; NO adjuvant therapy.   # DEC 2021-- METASTATIC CARCINOMA, COMPATIBLE WITH PULMONARY ADENOCARCINOMA [left shoulder Core Biopsy];  NOV 24th PET-right hilar and Hypermetabolic mediastinal lymphadenopathy; small pulmonary nodules; 2 metastatic lesions in the liver/  pancreatic head/left adrenal gland;  muscle and skeletal metastasis.  Subcutaneous nodule-positive for metastatic adeno lung.   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  Short course of Eliquis.smoking-quit 2008.  ---------------------------------------    DIAGNOSIS: LUNG CA  STAGE:  IV      ;GOALS:palliation  CURRENT/MOST RECENT THERAPY: DAB+TREM    Primary cancer of right upper lobe of lung ([place])  04/09/2020 Cancer Staging   Staging form: Lung, AJCC 8th Edition - Clinical: Stage IVB (cN1, cM1c) - Signed by Cammie Sickle, MD on 04/09/2020       HISTORY OF PRESENTING ILLNESS: Ambulating by independently.  Accompanied by his wife.  [name] 75 y.o.  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  In the interim patient was evaluated at [facility] for acute pancreatitis.  Imaging consistent with pancreatic inflammation; no biliary dilatation.  Patient was subsequently evaluated [facility]-discontinued dab+Trem.   Patient needed IV fluids; currently on clear liquid diet.   Currently patient is back to baseline.  Worsening fatigue especially after zometa infusion.  Review of Systems  Constitutional:  Positive for malaise/fatigue. Negative for chills, diaphoresis, fever and weight loss.  HENT:  Negative for nosebleeds and sore throat.   Eyes:  Negative for double vision.  Respiratory:  Negative for cough, hemoptysis, sputum production, shortness of breath and wheezing.   Cardiovascular:  Negative for chest pain, palpitations, orthopnea and leg swelling.  Gastrointestinal:  Negative for abdominal pain, blood in stool, constipation, diarrhea, heartburn, melena, nausea and vomiting.  Genitourinary:  Negative for dysuria, frequency and urgency.  Musculoskeletal:  Positive for back pain and joint pain.  Skin: Negative.  Negative for itching and rash.  Neurological:  Positive for weakness. Negative for dizziness, tingling, focal weakness and headaches.  Endo/Heme/Allergies:  Does not bruise/bleed easily.  Psychiatric/Behavioral:  Positive for memory loss. Negative for depression. The patient is not nervous/anxious and does not have insomnia.     MEDICAL HISTORY:  Past Medical History:  Diagnosis Date  . 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 Colon polyp 2013  . Hemorrhoids    pt denies  . Hiccoughs   . 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 Baruch Gouty and Dr. Nadeen Landau  .  Phlebitis 1998  . Primary cancer of right upper  lobe of lung ([place]) 01/14/2017   Dr. Genevive Bi performed a wedge resection of RUL.   . 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/27/2016    SURGICAL HISTORY: Past Surgical History:  Procedure Laterality Date  . COLONOSCOPY  2013   Dr Vira Agar  . COLONOSCOPY WITH PROPOFOL N/A 12/23/2016   Procedure: COLONOSCOPY WITH PROPOFOL;  Surgeon: Robert Bellow, MD;  Location: [facility];  Service: Endoscopy;  Laterality: N/A;  . EYE SURGERY    . HERNIA REPAIR Right 1990's  . TENDON REPAIR Left 03/14/2019   Procedure: TENDON REPAIR AND GRAFT  TRANSFER EXTENSOR INDICIS PROPRIUS LEFT THUMB;  Surgeon: [name], MD;  Location: [facility];  Service: Orthopedics;  Laterality: Left;  AXILLARY BLOCK  . THORACOTOMY Right 01/14/2017   Procedure: RIGHT THORACOSCOPYWITH WIDE WEDGE RESECTION,  PREOP BROCHOSCOPY;  Surgeon: [name], MD;  Location: [facility] ORS;  Service: General;  Laterality: Right;  . THROAT SURGERY  2010    SOCIAL HISTORY: Social History   Socioeconomic History  . Marital status: Married    Spouse name: Not on file  . Number of children: Not on file  . Years of education: Not on file  . Highest education level: Not on file  Occupational History  . Not on file  Tobacco Use  . Smoking status: Former    Years: 50.00    Types: Cigarettes    Quit date: 03/31/2007    Years since quitting: 13.8  . Smokeless tobacco: Never  Vaping Use  . Vaping Use: Never used  Substance and Sexual Activity  . Alcohol use: No  . Drug use: No  . Sexual activity: Yes    Birth control/protection: None    Comment: Married  Other Topics Concern  . Not on file  Social History Narrative  . Not on file   Social Determinants of Health   Financial Resource Strain: Not on file  Food Insecurity: Not on file  Transportation Needs: Not on file  Physical Activity: Not on file  Stress: Not on file  Social Connections: Not on file  Intimate Partner Violence: Not on file    FAMILY HISTORY: Family  History  Problem Relation Age of Onset  . Alzheimer's disease Mother   . Alzheimer's disease Father   . Stroke Father   . Colon cancer Neg Hx     ALLERGIES:  has No Known Allergies.  MEDICATIONS:  Current Outpatient Medications  Medication Sig Dispense Refill  . Ascorbic Acid (VITAMIN C) 1000 MG tablet Take 1,000 mg by mouth daily.    Marland Kitchen aspirin EC 81 MG tablet Take 81 mg by mouth daily.    . Calcium Carbonate-Vit D-Min (CALCIUM 1200 PO) Take 1 tablet by mouth daily.    . folic acid (FOLVITE) 160 MCG tablet Take 800 mcg by mouth daily.    . Multiple Vitamin (MULTIVITAMIN WITH MINERALS) TABS tablet Take 1 tablet by mouth daily. Senior Multivitamin.    . Multiple Vitamins-Minerals (EMERGEN-C IMMUNE PO) Take 1 tablet by mouth 2 (two) times daily.    . Omega-3 1000 MG CAPS Take 1 tablet by mouth 1 day or 1 dose.    Marland Kitchen omeprazole (PRILOSEC) 40 MG capsule Take by mouth.    . chlorproMAZINE (THORAZINE) 10 MG tablet Take 1 tablet (10 mg total) by mouth 3 (three) times daily. (Patient not taking: Reported on 01/22/2021) 15 tablet 0  . dabrafenib mesylate (TAFINLAR) 75 MG capsule Take 2 capsules (150 mg  total) by mouth 2 (two) times daily. Take on an empty stomach 1 hour before or 2 hours after meals. (Patient not taking: No sig reported) 120 capsule 3  . gabapentin (NEURONTIN) 100 MG capsule Take 1 capsule (100 mg total) by mouth 3 (three) times daily as needed. For hiccups (Patient not taking: Reported on 01/22/2021) 90 capsule 0  . Glucosamine-Chondroitin (COSAMIN DS PO) Take 1 tablet by mouth daily. (Patient not taking: No sig reported)    . niacinamide 500 MG tablet Take 500 mg by mouth 2 (two) times daily with a meal. (Patient not taking: No sig reported)    . senna (SENOKOT) 8.6 MG tablet Take 1 tablet by mouth daily. (Patient not taking: No sig reported)    . trametinib dimethyl sulfoxide (MEKINIST) 2 MG tablet Take 1 tablet (2 mg total) by mouth daily. Take 1 hour before or 2 hours after a  meal. Store refrigerated in original container. (Patient not taking: No sig reported) 30 tablet 3   No current facility-administered medications for this visit.      Marland Kitchen  PHYSICAL EXAMINATION: ECOG PERFORMANCE STATUS: 0 - Asymptomatic  Vitals:   01/22/21 1042  BP: (!) 150/73  Pulse: 66  Resp: 18  Temp: 97.8 F (36.6 C)  SpO2: 98%    Filed Weights   01/22/21 1042  Weight: 194 lb 14.4 oz (88.4 kg)     Physical Exam HENT:     Head: Normocephalic and atraumatic.     Mouth/Throat:     Pharynx: No oropharyngeal exudate.  Eyes:     Pupils: Pupils are equal, round, and reactive to light.  Cardiovascular:     Rate and Rhythm: Normal rate and regular rhythm.  Pulmonary:     Effort: Pulmonary effort is normal. No respiratory distress.     Breath sounds: Normal breath sounds. No wheezing.  Abdominal:     General: Bowel sounds are normal. There is no distension.     Palpations: Abdomen is soft. There is no mass.     Tenderness: There is no abdominal tenderness. There is no guarding or rebound.  Musculoskeletal:        General: No tenderness. Normal range of motion.     Cervical back: Normal range of motion and neck supple.  Skin:    General: Skin is warm.  Neurological:     Mental Status: He is alert and oriented to person, place, and time.  Psychiatric:        Mood and Affect: Affect normal.  ;  LABORATORY DATA:  I have reviewed the data as listed Lab Results  Component Value Date   WBC 5.7 01/22/2021   HGB 13.4 01/22/2021   HCT 41.2 01/22/2021   MCV 101.7 (H) 01/22/2021   PLT 305 01/22/2021   Recent Labs    01/09/21 0910 01/13/21 1303 01/22/21 1018  NA 132* 131* 136  K 4.2 3.4* 4.9  CL 96* 97* 100  CO2 27 25 29   GLUCOSE 134* 120* 118*  BUN 18 11 8   CREATININE 1.12 0.96 1.15  CALCIUM 8.3* 7.2* 9.0  GFRNONAA >60 >60 >60  PROT 7.6 7.2 7.2  ALBUMIN 3.7 3.3* 3.4*  AST 32 21 24  ALT 22 15 15   ALKPHOS 52 84 65  BILITOT 1.0 0.9 0.5    RADIOGRAPHIC  STUDIES: I have personally reviewed the radiological images as listed and agreed with the findings in the report. No results found.  ASSESSMENT & PLAN:   Primary cancer of  right upper lobe of lung ([place]) # Stage IV/recurrent adenocarcinoma of the lung/ mediastinal lymphadenopathy/liver/  pancreatic head/left adrenal gland/ muscle and skeletal metastasis/Subcutaneous nodule/Brain- on B-RAF V600E- on dabrafenib plus Mekinist. AUG 12th, 2022- No evidence of lung cancer recurrence or progression; stable right hilar lymph node; stable sclerotic metastatic disease in the pelvis/spine.  ] # Patient was recently on dab+Mek ; currently on hold because of pancreatitis.  Consider using Enco+Bini.  Again discussed the potential therapies of diarrhea/rash and pancreatitis[< 10%].  Discussed with pharmacy.  # acute pancreatitis-likely sec to B-raf inhibitor/Dabrafenib.   # Brain metastases: s/p WBRT; overall improved.  Off steroids; MARCH 2022- MRI brain showed significant improvement of metastatic lesions to the brain- STABLE; proceed with MRI brain.  # Bone lesions-mildly symptomatic; continue Zometa every 4-6 weeks [30 mins; tylenol]. with infusion premedicated with Tylenol at home.;HOLD Zometa for now/above acute issues.  #Fatigue appears multifactorial-s/p whole brain radiation; on chemotherapy; ? Zometa; ? Nocturia [see below]  # PSA elevation: As per PCP-PSA- 4.47.  Question BPH causing nocturia /causing difficulty sleeping/fatigue. Awaiting Urology Nov 2nd, Urology.   # DISPOSITION: [mon/-wed appt] # cancel zometa for nov 1st # follow up  As planned on Nov 1st-; MD- labs- cbc/cmp;prior-MRI brain Dr.B.    All questions were answered. The patient knows to call the clinic with any problems, questions or concerns.    Cammie Sickle, MD 01/22/2021 2:17 PM

## 2021-01-22 NOTE — Assessment & Plan Note (Addendum)
#   Stage IV/recurrent adenocarcinoma of the lung/ mediastinal lymphadenopathy/liver/  pancreatic head/left adrenal gland/ muscle and skeletal metastasis/Subcutaneous nodule/Brain- on B-RAF V600E- on dabrafenib plus Mekinist. AUG 12th, 2022- No evidence of lung cancer recurrence or progression; stable right hilar lymph node; stable sclerotic metastatic disease in the pelvis/spine.  ] # Patient was recently on dab+Mek ; currently on hold because of pancreatitis.  Consider using Enco+Bini.  Again discussed the potential therapies of diarrhea/rash and pancreatitis[< 10%].  Discussed with pharmacy.  # acute pancreatitis-likely sec to B-raf inhibitor/Dabrafenib.   # Brain metastases: s/p WBRT; overall improved.  Off steroids; MARCH 2022- MRI brain showed significant improvement of metastatic lesions to the brain- STABLE; proceed with MRI brain.  # Bone lesions-mildly symptomatic; continue Zometa every 4-6 weeks [30 mins; tylenol]. with infusion premedicated with Tylenol at home.;HOLD Zometa for now/above acute issues.  #Fatigue appears multifactorial-s/p whole brain radiation; on chemotherapy; ? Zometa; ? Nocturia [see below]  # PSA elevation: As per PCP-PSA- 4.47.  Question BPH causing nocturia /causing difficulty sleeping/fatigue. Awaiting Urology Nov 2nd, Urology.   # DISPOSITION: [mon/-wed appt] # cancel zometa for nov 1st # follow up  As planned on Nov 1st-; MD- labs- cbc/cmp;prior-MRI brain Dr.B.

## 2021-01-22 NOTE — Telephone Encounter (Signed)
[name] Encounter   Received notification from [facility] that prior authorization for [facility] and Kathi Der is required.   Mektovi PA submitted on CoverMyMeds Key [id number] Status is pending    Braftovi PA submitted on CoverMyMeds Key [id number] Status is pending   Oral Oncology Clinic will continue to follow.  [place] Patient [place] Phone [phone] Fax [phone] 01/22/2021 12:24 PM

## 2021-01-22 NOTE — Progress Notes (Signed)
[place]  Telephone:([phone] Fax:(336) [phone]  Patient Care Team: [name], MD as PCP - General (Family Medicine) [name], MD as Consulting Physician (Family Medicine) [name], [name], MD (General Surgery) [place] Park, DO as Consulting Physician (Radiation Oncology)   Name of the patient: Curtis Andrade  [id number]  [birth year]   Date of visit: 01/22/21  HPI: Patient is a 75 y.o. 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 He was treated with Mekinist (trametinib) and Tafinlar (dabrafenib), but presented with pancreatitis. Treatment with dabrafenib and trametinib was stopped. The plan is to switch him to treatment with Mektovi (binimetinib) and Braftovi (encorafenib) (off label use, actively being studied in [place]).  Studies for encorafenib and binimetinib in NSCLC: ENCO-BRAF ([id number]) PHAROS ([id number])  Reason for Consult: Mektovi (binimetinib) and Braftovi (encorafenib) oral chemotherapy education.   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 Baruch Gouty and Dr. Nadeen Landau   Phlebitis 1998   Primary cancer of right upper lobe of lung ([place]) 01/14/2017   Dr. Genevive Bi performed a wedge resection of RUL.    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/27/2016    HEMATOLOGY/ONCOLOGY HISTORY:  Oncology History Overview Note  # OCT 2018- STAGE I- A. 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  - PARENCHYMAL MARGIN APPEARS CLEAR; TUMOR WAS 1 CM FROM MARGIN BEFORE REMOVAL OF STAPLE LINE.; NO adjuvant therapy.   # DEC 2021-- METASTATIC CARCINOMA,  COMPATIBLE WITH PULMONARY ADENOCARCINOMA [left shoulder Core Biopsy];  NOV 24th PET-right hilar and Hypermetabolic mediastinal lymphadenopathy; small pulmonary nodules; 2 metastatic lesions in the liver/  pancreatic head/left adrenal gland;  muscle and skeletal metastasis.  Subcutaneous nodule-positive for metastatic adeno lung.   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  Short course of Eliquis.smoking-quit 2008.  ---------------------------------------    DIAGNOSIS: LUNG CA  STAGE:  IV      ;GOALS:palliation  CURRENT/MOST RECENT THERAPY: DAB+TREM    Primary cancer of right upper lobe of lung ([place])  04/09/2020 Cancer Staging   Staging form: Lung, AJCC 8th Edition - Clinical: Stage IVB (cN1, cM1c) - Signed by Cammie Sickle, MD on 04/09/2020      ALLERGIES:  has No Known Allergies.  MEDICATIONS:  Current Outpatient Medications  Medication Sig Dispense Refill   Ascorbic Acid (VITAMIN C) 1000 MG tablet Take 1,000 mg by mouth daily.     aspirin EC 81 MG tablet Take 81 mg by mouth daily.     Calcium Carbonate-Vit D-Min (CALCIUM 1200 PO) Take 1 tablet by mouth daily.     chlorproMAZINE (THORAZINE) 10 MG tablet Take 1 tablet (10 mg total) by mouth 3 (three) times daily. (Patient not taking: Reported on 01/22/2021) 15 tablet 0   dabrafenib mesylate (TAFINLAR) 75 MG capsule Take 2 capsules (150 mg total) by mouth 2 (two) times daily. Take on an empty stomach  1 hour before or 2 hours after meals. (Patient not taking: No sig reported) 161 capsule 3   folic acid (FOLVITE) 096 MCG tablet Take 800 mcg by mouth daily.     gabapentin (NEURONTIN) 100 MG capsule Take 1  capsule (100 mg total) by mouth 3 (three) times daily as needed. For hiccups (Patient not taking: Reported on 01/22/2021) 90 capsule 0   Glucosamine-Chondroitin (COSAMIN DS PO) Take 1 tablet by mouth daily. (Patient not taking: No sig reported)     Multiple Vitamin (MULTIVITAMIN WITH MINERALS) TABS tablet Take 1 tablet by mouth daily. Senior Multivitamin.     Multiple Vitamins-Minerals (EMERGEN-C IMMUNE PO) Take 1 tablet by mouth 2 (two) times daily.     niacinamide 500 MG tablet Take 500 mg by mouth 2 (two) times daily with a meal. (Patient not taking: No sig reported)     Omega-3 1000 MG CAPS Take 1 tablet by mouth 1 day or 1 dose.     omeprazole (PRILOSEC) 40 MG capsule Take by mouth.     senna (SENOKOT) 8.6 MG tablet Take 1 tablet by mouth daily. (Patient not taking: No sig reported)     trametinib dimethyl sulfoxide (MEKINIST) 2 MG tablet Take 1 tablet (2 mg total) by mouth daily. Take 1 hour before or 2 hours after a meal. Store refrigerated in original container. (Patient not taking: No sig reported) 30 tablet 3   No current facility-administered medications for this visit.    VITAL SIGNS: There were no vitals taken for this visit. There were no vitals filed for this visit.  Estimated body mass index is 27.18 kg/m as calculated from the following:   Height as of 12/23/20: '5\' 11"'  (1.803 m).   Weight as of an earlier encounter on 01/22/21: 88.4 kg (194 lb 14.4 oz).  LABS: CBC:    Component Value Date/Time   WBC 5.7 01/22/2021 1018   HGB 13.4 01/22/2021 1018   HCT 41.2 01/22/2021 1018   PLT 305 01/22/2021 1018   MCV 101.7 (H) 01/22/2021 1018   NEUTROABS 3.6 01/22/2021 1018   LYMPHSABS 1.3 01/22/2021 1018   MONOABS 0.5 01/22/2021 1018   EOSABS 0.2 01/22/2021 1018   BASOSABS 0.1 01/22/2021 1018   Comprehensive Metabolic Panel:    Component Value Date/Time   NA 136 01/22/2021 1018   K 4.9 01/22/2021 1018   CL 100 01/22/2021 1018   CO2 29 01/22/2021 1018   BUN 8 01/22/2021  1018   CREATININE 1.15 01/22/2021 1018   GLUCOSE 118 (H) 01/22/2021 1018   CALCIUM 9.0 01/22/2021 1018   AST 24 01/22/2021 1018   ALT 15 01/22/2021 1018   ALKPHOS 65 01/22/2021 1018   BILITOT 0.5 01/22/2021 1018   PROT 7.2 01/22/2021 1018   ALBUMIN 3.4 (L) 01/22/2021 1018     Present during today's visit: patient and wife Hassan Rowan  Start plan: Encorafenib and binimetinib treatment start dependent on recovery from pancreatitis  Treatment Assessment: CMP from 01/22/21 assessed, no relevant lab abnormalities. Prescription dose and frequency assessed. Planned duration until disease progression or unacceptable drug toxicity.  Mr. Rihn will need ECHOs before initiating treatment, 1 month after initiating treatment, then every 2 to 3 months during treatment (due to binimetinib)  Current medication list in Epic reviewed, no DDIs with encorafenib and binimetinib identified.  Patient Education I spoke with patient for overview of new oral chemotherapy medication: encorafenib and binimetinib   Administration: Counseled patient on administration, dosing, side effects, monitoring, drug-food interactions, safe handling,  storage, and disposal. Patient will take: Encorafenib: Take 450 mg (six 75 mg capsules) by mouth once daily Binimetinib: Take 45 mg (three 15 mg tablets) by mouth twice daily  Side Effects: Side effects include but not limited to: changes in kidney function, fatigue, N/V, diarrhea, and hgb.    Adherence: After discussion with patient no patient barriers to medication adherence identified.  Reviewed with patient importance of keeping a medication schedule and plan for any missed doses.  The Chapmans voiced understanding and appreciation. All questions answered. Medication handout provided.  Provided patient with [facility] phone number. Patient knows to call the office with questions or concerns. Oral Chemotherapy Navigation Clinic will continue to  follow.  Patient expressed understanding and was in agreement with this plan. He also understands that He can call clinic at any time with any questions, concerns, or complaints.   Medication Access Issues: Working on medication access, patient will likely need manufacturer assistance for a high copy, he signed application while in the office today  Follow-up plan: RTC on 02/03/21  Thank you for allowing me to participate in the care of this patient.   Time Total: 20 mins  Visit consisted of counseling and education on dealing with issues of symptom management in the setting of serious and potentially life-threatening illness.Greater than 50%  of this time was spent counseling and coordinating care related to the above assessment and plan.  Signed by: Darl Pikes, PharmD, BCPS, Salley Slaughter, CPP Hematology/Oncology Clinical Pharmacist Practitioner [facility]/DB/AP [facility] [phone]  01/22/2021 2:27 PM

## 2021-01-24 ENCOUNTER — Other Ambulatory Visit (HOSPITAL_COMMUNITY): Payer: Self-pay

## 2021-01-24 ENCOUNTER — Encounter: Payer: Self-pay | Admitting: Internal Medicine

## 2021-01-24 NOTE — Telephone Encounter (Signed)
[name] Encounter  Prior Authorization for Curtis Andrade has been approved.    PA# [id number] Effective dates: 01/22/21 through 01/22/22  Patients co-pay is $3373.97. Off label use.  Grants will not cover the drug for this disease state.  Applying for manufacturer assistance.  Oral Oncology Clinic will continue to follow.   [place] Patient [place] Phone [phone] Fax [phone] 01/24/2021 9:12 AM

## 2021-01-27 ENCOUNTER — Telehealth: Payer: Self-pay | Admitting: Pharmacy Technician

## 2021-01-27 ENCOUNTER — Other Ambulatory Visit (HOSPITAL_COMMUNITY): Payer: Self-pay

## 2021-01-27 NOTE — Telephone Encounter (Signed)
[name] Encounter  Received notification from [employer] that the PA denial for Irean Hong has been overturned and is now approved through the patients insurance.    PA# [id number] Effective dates: 01/25/21 through 01/25/22  Patients co-pay is $1948.31.  Oral Oncology Clinic will continue to follow.   [place] Patient [place] Phone [phone] Fax [phone] 01/27/2021 9:59 AM

## 2021-01-27 NOTE — Telephone Encounter (Signed)
[name] Encounter  Received notification from [employer] that the request for prior authorization for [facility] has been denied due to the diagnosis of NSCLC is unapproved for the medication.     This determination was appealed.  The appeal packet was faxed to [phone] on 01/24/21.   [place] Patient [place] Phone [phone] Fax [phone] 01/27/2021 9:46 AM

## 2021-01-28 NOTE — Telephone Encounter (Signed)
[name] Encounter  Met patient in after appt on 90/38 to complete application for [employer] Patient Assistance in an effort to reduce patient's out of pocket expense for [facility] and Braftovi to $0.    Application completed and faxed to [phone] on 01/27/21.   [employer] patient assistance phone number for follow up is [phone].   This encounter will be updated until final determination.   [place] Patient [place] Phone [phone] Fax [phone] 01/28/2021 11:08 AM

## 2021-01-29 ENCOUNTER — Encounter: Payer: Self-pay | Admitting: Urology

## 2021-01-29 ENCOUNTER — Other Ambulatory Visit: Payer: Self-pay

## 2021-01-29 ENCOUNTER — Ambulatory Visit: Payer: PPO | Admitting: Urology

## 2021-01-29 VITALS — BP 162/83 | HR 97 | Ht 71.0 in | Wt 196.0 lb

## 2021-01-29 DIAGNOSIS — R351 Nocturia: Secondary | ICD-10-CM

## 2021-01-29 DIAGNOSIS — R972 Elevated prostate specific antigen [PSA]: Secondary | ICD-10-CM

## 2021-01-29 NOTE — Progress Notes (Signed)
01/29/2021 2:00 PM   Nikash Mortensen. Jan 17, 1946 [id number]  Referring provider: Cammie Sickle, MD [place],  [place] [address]  Chief Complaint  Patient presents with   Elevated PSA     HPI: Curtis Andrade. is a 75 y.o. male referred for evaluation of an elevated PSA.  Followed by medical oncology for stage IV lung cancer; palliative therapy PSA checked by PCP which was 4.47; baseline PSA low 2-low 3 range Urinalysis was normal His only morning complaint is nocturia x3-5 which he states started after his most recent chemo regimen that has been discontinued secondary to pancreatitis.  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 Baruch Gouty and Dr. Nadeen Landau   Phlebitis 1998   Primary cancer of right upper lobe of lung ([place]) 01/14/2017   Dr. Genevive Bi performed a wedge resection of RUL.    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/27/2016    Surgical History: Past Surgical History:  Procedure Laterality Date   COLONOSCOPY  2013   Dr Vira Agar   COLONOSCOPY WITH PROPOFOL N/A 12/23/2016   Procedure: COLONOSCOPY WITH PROPOFOL;  Surgeon: Robert Bellow, MD;  Location[facility] ENDOSCOPY;  Service: Endoscopy;  Laterality: N/A;   EYE SURGERY     HERNIA REPAIR Right 1990's   TENDON REPAIR Left 03/14/2019   Procedure: TENDON REPAIR AND GRAFT  TRANSFER EXTENSOR INDICIS PROPRIUS LEFT THUMB;  Surgeon: [name], MD;  Location: [place];  Service: Orthopedics;  Laterality: Left;  AXILLARY BLOCK   THORACOTOMY Right 01/14/2017   Procedure: RIGHT THORACOSCOPYWITH  WIDE WEDGE RESECTION,  PREOP BROCHOSCOPY;  Surgeon: [name], MD;  Location: [facility] ORS;  Service: General;  Laterality: Right;   THROAT SURGERY  2010    Home Medications:  Allergies as of 01/29/2021   No Known Allergies      Medication List        Accurate as of January 29, 2021  2:00 PM. If you have any questions, ask your nurse or doctor.          STOP taking these medications    dabrafenib mesylate 75 MG capsule Commonly known as: TAFINLAR Stopped by: Abbie Sons, MD   gabapentin 100 MG capsule Commonly known as: Neurontin Stopped by: Abbie Sons, MD   trametinib dimethyl sulfoxide 2 MG tablet Commonly known as: MEKINIST Stopped by: Abbie Sons, MD       TAKE these medications    aspirin EC 81 MG tablet Take 81 mg by mouth daily.   CALCIUM 1200 PO Take 1 tablet by mouth daily.   EMERGEN-C IMMUNE PO Take 1 tablet by mouth 2 (two) times daily.   folic acid 378 MCG tablet Commonly known as: FOLVITE Take 800 mcg by mouth daily.   multivitamin with minerals Tabs tablet Take 1 tablet by mouth daily. Senior Multivitamin.   Omega-3 1000 MG Caps Take 1 tablet by mouth 1 day or 1 dose.   omeprazole 40 MG capsule Commonly known as: PRILOSEC Take by mouth.   vitamin C 1000 MG tablet Take  1,000 mg by mouth daily.        Allergies: No Known Allergies  Family History: Family History  Problem Relation Age of Onset   Alzheimer's disease Mother    Alzheimer's disease Father    Stroke Father    Colon cancer Neg Hx     Social History:  reports that he quit smoking about 13 years ago. His smoking use included cigarettes. He has never used smokeless tobacco. He reports that he does not drink alcohol and does not use drugs.   Physical Exam: BP (!) 162/83   Pulse 97   Ht 5\' 11"  (1.803 m)   Wt 196 lb (88.9 kg)   BMI 27.34 kg/m   Constitutional:  Alert and oriented, No acute distress. HEENT: [place] AT, moist mucus membranes.  Trachea  midline, no masses. Cardiovascular: No clubbing, cyanosis, or edema. Respiratory: Normal respiratory effort, no increased work of breathing. GU: Prostate 50 g, smooth without nodules Psychiatric: Normal mood and affect.   Assessment & Plan:    1.  Elevated PSA Minimal elevated PSA and slightly above baseline PSA We discussed current recommendations for prostate cancer screening which includes a PSA/DRE between the ages of 22-69 His DRE is benign He has metastatic lung cancer and states Dr. Rogue Bussing has not discussed an estimated life expectancy.  If his life expectancy is <10 years would not recommend further evaluation of his PSA We did discuss potential options including observation and prostate biopsy.  Based on his age, PSA level would not recommend biopsy Would check PSA in 1 year which he states he will have done with Dr. Kary Kos and return here for any increasing PSA  2.  Nocturia We discussed the multiple potential etiologies of nocturia including nocturnal polyuria, sleep apnea.  Unlikely BPH as he has no daytime symptoms.  He states his nocturia does not interfere with his sleep pattern and he does not desire any further evaluation or management at this time    Abbie Sons, [place] [address] [place], [place] [address] [phone]

## 2021-01-31 ENCOUNTER — Other Ambulatory Visit: Payer: Self-pay

## 2021-01-31 ENCOUNTER — Ambulatory Visit
Admission: RE | Admit: 2021-01-31 | Discharge: 2021-01-31 | Disposition: A | Discharge status: Home / Self Care | Payer: PPO | Source: Ambulatory Visit | Attending: Internal Medicine | Admitting: Internal Medicine

## 2021-01-31 DIAGNOSIS — C7931 Secondary malignant neoplasm of brain: Secondary | ICD-10-CM | POA: Diagnosis not present

## 2021-01-31 DIAGNOSIS — R5383 Other fatigue: Secondary | ICD-10-CM | POA: Diagnosis not present

## 2021-01-31 DIAGNOSIS — C3411 Malignant neoplasm of upper lobe, right bronchus or lung: Secondary | ICD-10-CM | POA: Diagnosis not present

## 2021-01-31 IMAGING — MR MR HEAD WO/W CM
14 series · 48 of 48 positions shown · IV contrast (gadavist)
Comparison: [DATE]

CLINICAL DATA: Lung cancer, evaluate for brain metastases, fatigue

EXAM:
MRI HEAD WITHOUT AND WITH CONTRAST
TECHNIQUE: Multiplanar, multiecho pulse sequences of the brain and surrounding
structures were obtained without and with intravenous contrast.
CONTRAST:  7.5mL GADAVIST GADOBUTROL 1 MMOL/ML IV SOLN

[Series 5: ax dwi_tracew · axial · 3.0mm · 0.65mm/px · z∈[-70,+88]mm · 4 of 50 slices shown]
[im 1/50]
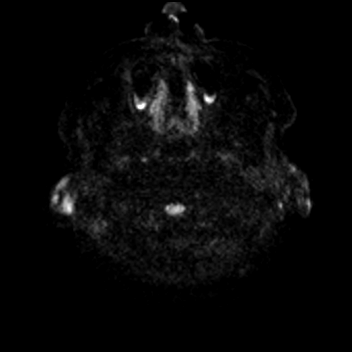
[im 17/50]
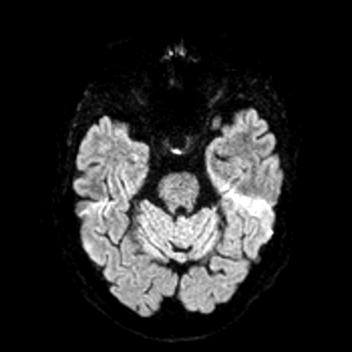
[im 33/50]
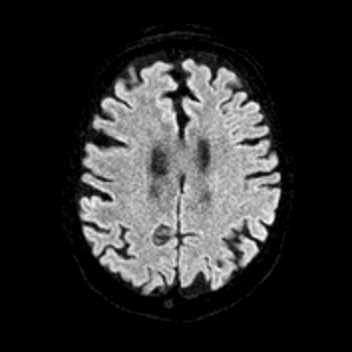
[im 50/50]
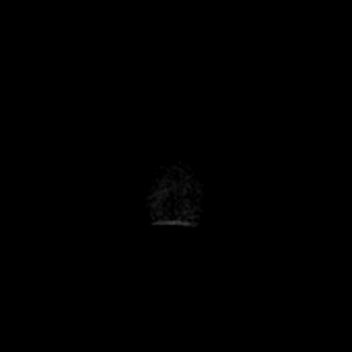

[Series 6: ax dwi_adc · axial · 3.0mm · 0.65mm/px · z∈[-70,+88]mm · 3 of 50 slices shown]
[im 1/50]
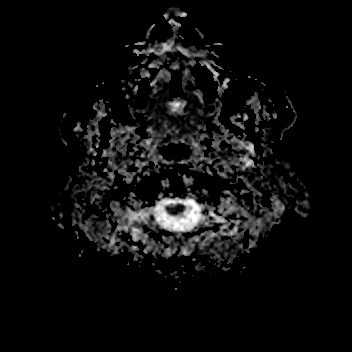
[im 25/50]
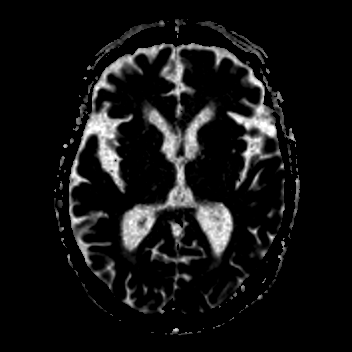
[im 50/50]
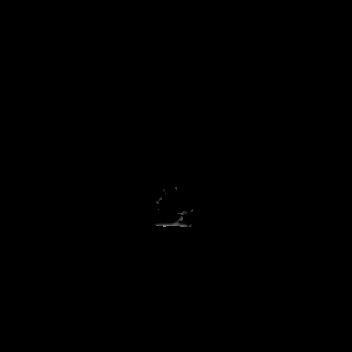

[Series 7: cor dwi_tracew · coronal · 5.0mm · 0.60mm/px · 2 of 40 slices shown]
[im 1/40]
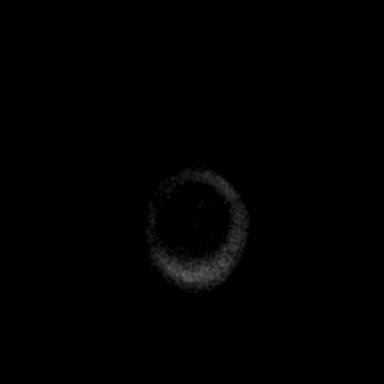
[im 40/40]
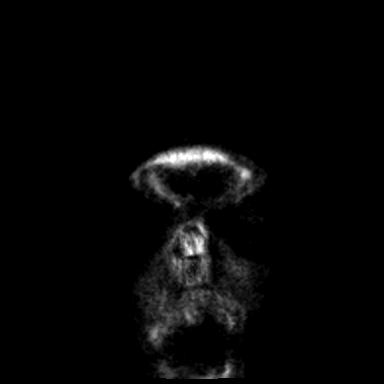

[Series 8: cor dwi_adc · coronal · 5.0mm · 0.60mm/px · 2 of 40 slices shown]
[im 1/40]
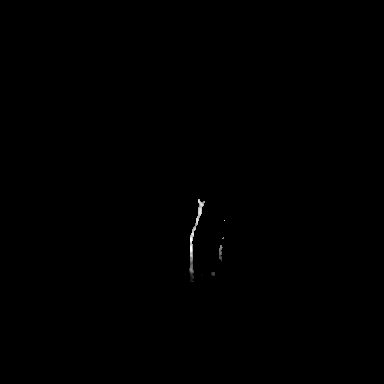
[im 40/40]
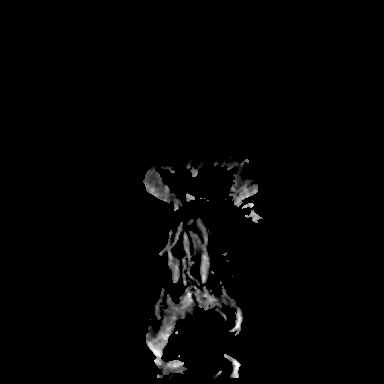

[Series 9: T1 · sagittal · 5.0mm · 0.62mm/px · 1 of 23 slices shown (1 of 2)]
[im 1/23]
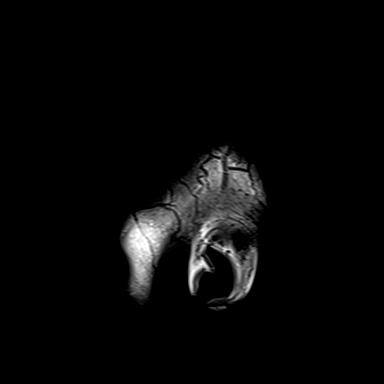

[Series 10: T2 · axial · 5.0mm · 0.53mm/px · z∈[-71,+87]mm · 2 of 28 slices shown]
[im 1/28]
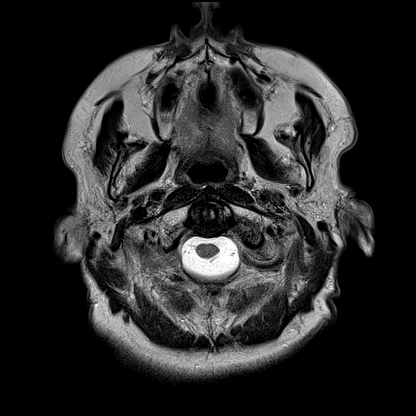
[im 28/28]
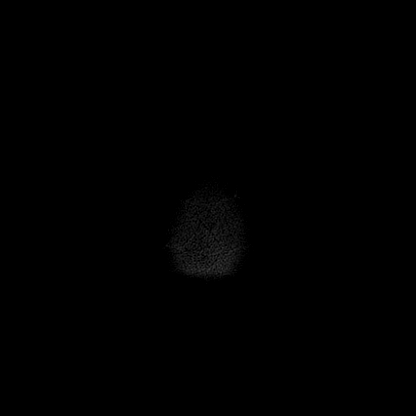

[Series 12: pha_images · axial · 3.0mm · 0.90mm/px · z∈[-77,+96]mm · 3 of 60 slices shown]
[im 1/60]
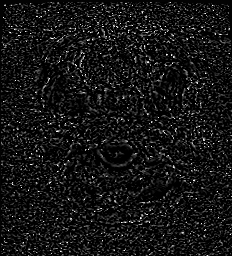
[im 30/60]
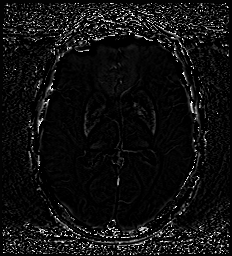
[im 60/60]
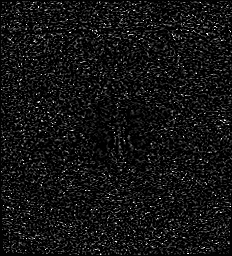

[Series 13: swi_images · axial · 3.0mm · 0.90mm/px · z∈[-77,+96]mm · 3 of 60 slices shown]
[im 1/60]
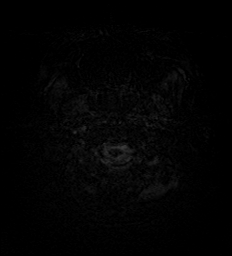
[im 30/60]
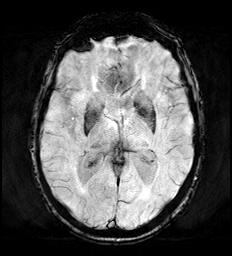
[im 60/60]
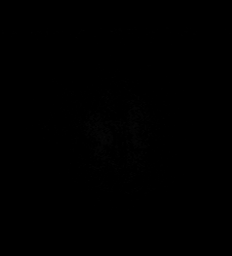

[Series 15: FLAIR · axial · 3.0mm · 0.53mm/px · z∈[-71,+87]mm · 3 of 55 slices shown]
[im 1/55]
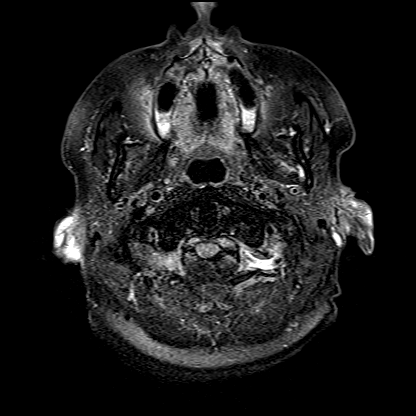
[im 28/55]
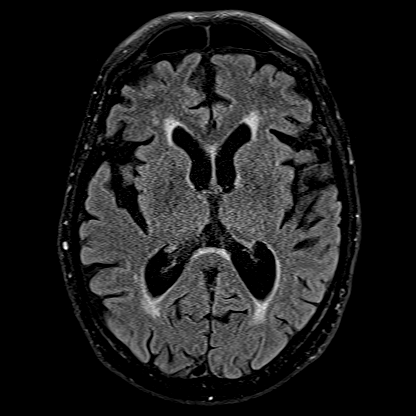
[im 55/55]
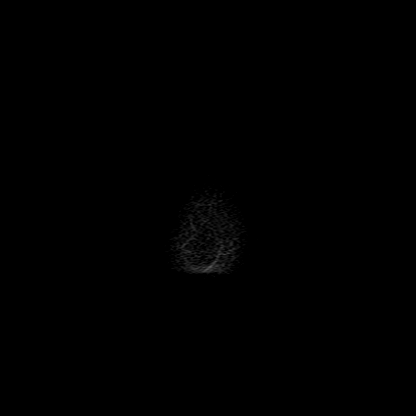

[Series 16: T1 · axial · 1.0mm · 0.98mm/px · z∈[-75,+96]mm · 10 of 176 slices shown (2 of 2)]
[im 1/176]
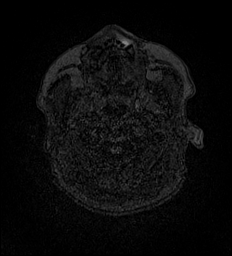
[im 20/176]
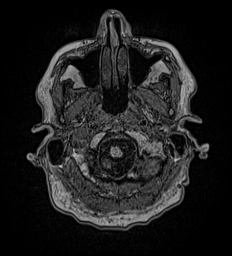
[im 39/176]
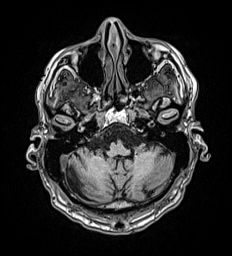
[im 59/176]
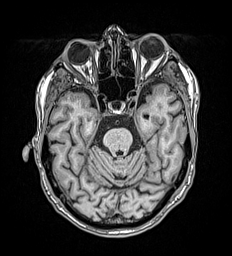
[im 78/176]
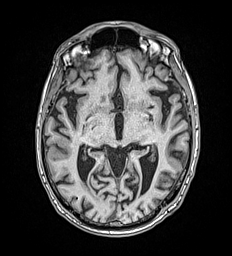
[im 98/176]
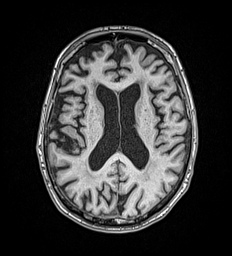
[im 117/176]
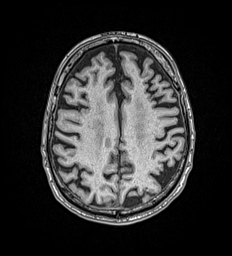
[im 137/176]
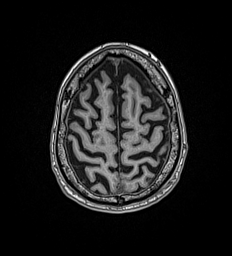
[im 156/176]
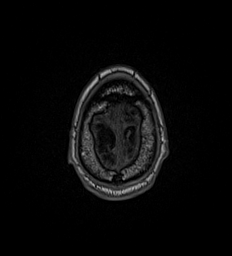
[im 176/176]
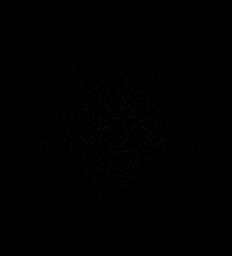

[Series 17: T2 post-contrast · coronal · 5.0mm · 0.57mm/px · 2 of 29 slices shown]
[im 1/29]
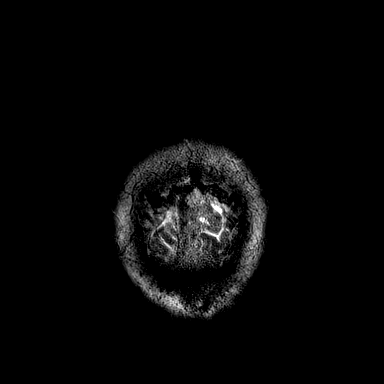
[im 29/29]
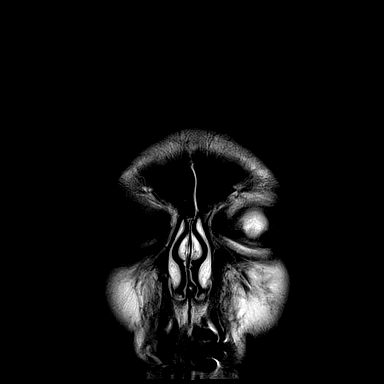

[Series 18: T1 post-contrast · axial · 1.0mm · 0.98mm/px · z∈[-75,+96]mm · 10 of 176 slices shown (1 of 3)]
[im 1/176]
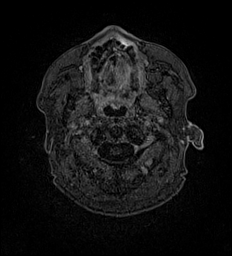
[im 20/176]
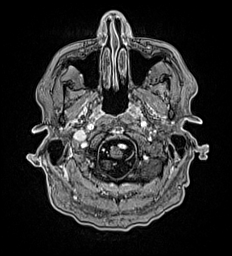
[im 39/176]
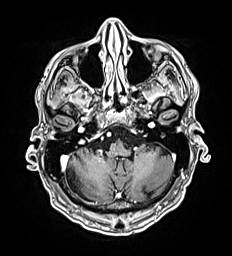
[im 59/176]
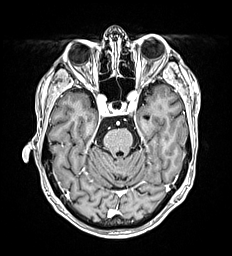
[im 78/176]
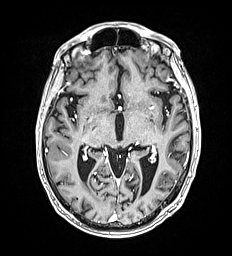
[im 98/176]
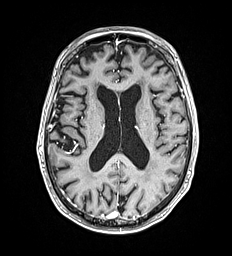
[im 117/176]
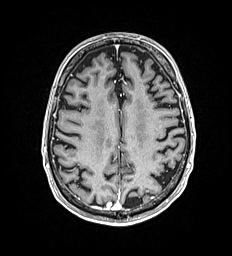
[im 137/176]
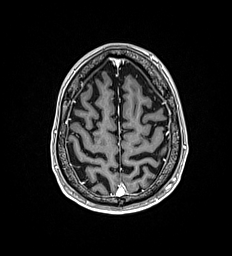
[im 156/176]
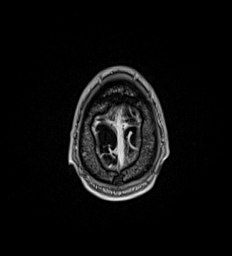
[im 176/176]
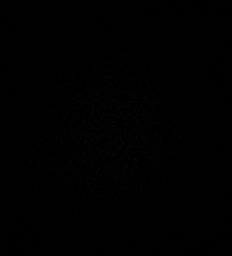

[Series 19: T1 post-contrast · coronal · 5.0mm · 0.57mm/px · 2 of 29 slices shown (2 of 3)]
[im 1/29]
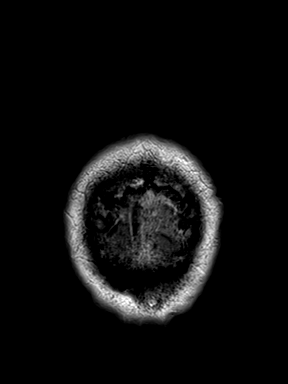
[im 29/29]
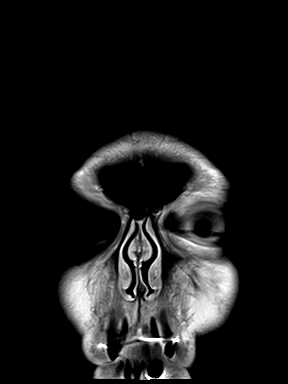

[Series 20: T1 post-contrast · sagittal · 5.0mm · 0.62mm/px · 1 of 23 slices shown (3 of 3)]
[im 1/23]
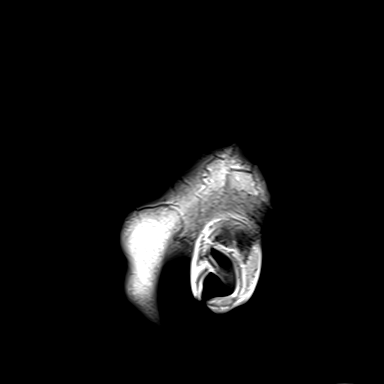

[48 of 48 positions shown; findings below may reference images not displayed]

FINDINGS: Brain: No restricted diffusion to suggest acute infarct or
nonenhancing lesion. No abnormal enhancement. Previously noted
hemorrhagic metastatic foci in the brain are further decreased in
conspicuity. Some of these demonstrate minimal surrounding intrinsic
T1 hyperintense signal but no definite enhancement. No new or
enlarging lesions. No significant mass effect or edema. No
extra-axial collection or hydrocephalus.

Vascular: Normal flow voids.

Skull and upper cervical spine: No osseous lesions identified.
Normal marrow signal.

Sinuses/Orbits: Negative.  Status post bilateral lens replacements.

Other: Fluid in the right-greater-than-left mastoid air cells.
IMPRESSION: Further interval improvement in numerous hemorrhagic metastases in
the brain, which have decreased in conspicuity. No new lesions
identified. No acute intracranial process.

## 2021-01-31 MED ORDER — GADOBUTROL 1 MMOL/ML IV SOLN
7.5000 mL | Freq: Once | INTRAVENOUS | Status: AC | PRN
  Administered 2021-01-31: 7.5 mL via INTRAVENOUS

## 2021-01-31 MED ORDER — GADOBUTROL 1 MMOL/ML IV SOLN: 9.0000 mL | Freq: Once | INTRAVENOUS | Status: DC | PRN

## 2021-02-01 ENCOUNTER — Encounter: Payer: Self-pay | Admitting: Internal Medicine

## 2021-02-03 ENCOUNTER — Ambulatory Visit: Payer: PPO

## 2021-02-03 ENCOUNTER — Telehealth: Payer: Self-pay | Admitting: Pharmacy Technician

## 2021-02-03 ENCOUNTER — Inpatient Hospital Stay: Payer: PPO

## 2021-02-03 ENCOUNTER — Inpatient Hospital Stay: Payer: PPO | Attending: Internal Medicine

## 2021-02-03 ENCOUNTER — Encounter: Payer: Self-pay | Admitting: Internal Medicine

## 2021-02-03 ENCOUNTER — Other Ambulatory Visit: Payer: Self-pay

## 2021-02-03 ENCOUNTER — Inpatient Hospital Stay: Payer: PPO | Admitting: Pharmacist

## 2021-02-03 ENCOUNTER — Inpatient Hospital Stay: Payer: PPO | Admitting: Internal Medicine

## 2021-02-03 VITALS — BP 139/75 | HR 80 | Temp 97.8°F | Resp 19 | Wt 198.2 lb

## 2021-02-03 DIAGNOSIS — Z823 Family history of stroke: Secondary | ICD-10-CM | POA: Insufficient documentation

## 2021-02-03 DIAGNOSIS — Z7189 Other specified counseling: Secondary | ICD-10-CM | POA: Diagnosis not present

## 2021-02-03 DIAGNOSIS — M549 Dorsalgia, unspecified: Secondary | ICD-10-CM | POA: Insufficient documentation

## 2021-02-03 DIAGNOSIS — C7972 Secondary malignant neoplasm of left adrenal gland: Secondary | ICD-10-CM | POA: Diagnosis not present

## 2021-02-03 DIAGNOSIS — M2550 Pain in unspecified joint: Secondary | ICD-10-CM | POA: Insufficient documentation

## 2021-02-03 DIAGNOSIS — R531 Weakness: Secondary | ICD-10-CM | POA: Insufficient documentation

## 2021-02-03 DIAGNOSIS — Z923 Personal history of irradiation: Secondary | ICD-10-CM | POA: Insufficient documentation

## 2021-02-03 DIAGNOSIS — C3411 Malignant neoplasm of upper lobe, right bronchus or lung: Secondary | ICD-10-CM | POA: Diagnosis not present

## 2021-02-03 DIAGNOSIS — Z8521 Personal history of malignant neoplasm of larynx: Secondary | ICD-10-CM | POA: Diagnosis not present

## 2021-02-03 DIAGNOSIS — C7889 Secondary malignant neoplasm of other digestive organs: Secondary | ICD-10-CM | POA: Diagnosis not present

## 2021-02-03 DIAGNOSIS — C7951 Secondary malignant neoplasm of bone: Secondary | ICD-10-CM | POA: Diagnosis not present

## 2021-02-03 DIAGNOSIS — R5383 Other fatigue: Secondary | ICD-10-CM | POA: Diagnosis not present

## 2021-02-03 DIAGNOSIS — C787 Secondary malignant neoplasm of liver and intrahepatic bile duct: Secondary | ICD-10-CM | POA: Insufficient documentation

## 2021-02-03 DIAGNOSIS — K859 Acute pancreatitis without necrosis or infection, unspecified: Secondary | ICD-10-CM | POA: Diagnosis not present

## 2021-02-03 DIAGNOSIS — Z23 Encounter for immunization: Secondary | ICD-10-CM | POA: Insufficient documentation

## 2021-02-03 DIAGNOSIS — R413 Other amnesia: Secondary | ICD-10-CM | POA: Insufficient documentation

## 2021-02-03 DIAGNOSIS — Z818 Family history of other mental and behavioral disorders: Secondary | ICD-10-CM | POA: Insufficient documentation

## 2021-02-03 DIAGNOSIS — Z8719 Personal history of other diseases of the digestive system: Secondary | ICD-10-CM | POA: Insufficient documentation

## 2021-02-03 DIAGNOSIS — Z79899 Other long term (current) drug therapy: Secondary | ICD-10-CM | POA: Diagnosis not present

## 2021-02-03 DIAGNOSIS — Z87891 Personal history of nicotine dependence: Secondary | ICD-10-CM | POA: Diagnosis not present

## 2021-02-03 LAB — COMPREHENSIVE METABOLIC PANEL
ALT: 19 U/L (ref 0–44)
AST: 26 U/L (ref 15–41)
Albumin: 3.7 g/dL (ref 3.5–5.0)
Alkaline Phosphatase: 54 U/L (ref 38–126)
Anion gap: 8 (ref 5–15)
BUN: 15 mg/dL (ref 8–23)
CO2: 28 mmol/L (ref 22–32)
Calcium: 8.8 mg/dL — ABNORMAL LOW (ref 8.9–10.3)
Chloride: 102 mmol/L (ref 98–111)
Creatinine, Ser: 0.94 mg/dL (ref 0.61–1.24)
GFR, Estimated: >60 mL/min (ref >60)
Glucose, Bld: 117 mg/dL — ABNORMAL HIGH (ref 70–99)
Potassium: 5 mmol/L (ref 3.5–5.1)
Sodium: 138 mmol/L (ref 135–145)
Total Bilirubin: 0.8 mg/dL (ref 0.3–1.2)
Total Protein: 7.1 g/dL (ref 6.5–8.1)

## 2021-02-03 LAB — CBC WITH DIFFERENTIAL/PLATELET
Abs Immature Granulocytes: 0.02 10*3/uL (ref 0.00–0.07)
Basophils Absolute: 0.1 10*3/uL (ref 0.0–0.1)
Basophils Relative: 1 %
Eosinophils Absolute: 0.3 10*3/uL (ref 0.0–0.5)
Eosinophils Relative: 5 %
HCT: 43.5 % (ref 39.0–52.0)
Hemoglobin: 14 g/dL (ref 13.0–17.0)
Immature Granulocytes: 0 %
Lymphocytes Relative: 25 %
Lymphs Abs: 1.5 10*3/uL (ref 0.7–4.0)
MCH: 32.6 pg (ref 26.0–34.0)
MCHC: 32.2 g/dL (ref 30.0–36.0)
MCV: 101.2 fL — ABNORMAL HIGH (ref 80.0–100.0)
Monocytes Absolute: 0.7 10*3/uL (ref 0.1–1.0)
Monocytes Relative: 11 %
Neutro Abs: 3.6 10*3/uL (ref 1.7–7.7)
Neutrophils Relative %: 58 %
Platelets: 203 10*3/uL (ref 150–400)
RBC: 4.3 MIL/uL (ref 4.22–5.81)
RDW: 14.5 % (ref 11.5–15.5)
WBC: 6.2 10*3/uL (ref 4.0–10.5)
nRBC: 0 % (ref 0.0–0.2)

## 2021-02-03 MED ORDER — INFLUENZA VAC A&B SA ADJ QUAD 0.5 ML IM PRSY: 0.5000 mL | PREFILLED_SYRINGE | Freq: Once | INTRAMUSCULAR | Status: DC

## 2021-02-03 MED ORDER — INFLUENZA VAC A&B SA ADJ QUAD 0.5 ML IM PRSY
0.5000 mL | PREFILLED_SYRINGE | Freq: Once | INTRAMUSCULAR | Status: AC
  Administered 2021-02-03: 0.5 mL via INTRAMUSCULAR
  Filled 2021-02-03: qty 0.5

## 2021-02-03 NOTE — Telephone Encounter (Signed)
[name] Encounter  Received notification from [place] Patient Assistance program that patient has been successfully enrolled into their program to receive Mektovi and Braftovi from the manufacturer at $0 out of pocket until 03/29/21.    Patient received approval and knows we will have to re-apply.   Specialty Pharmacy that will dispense medication is [facility].  Patient knows to call the office with questions or concerns.   Oral Oncology Clinic will continue to follow.  [place] Patient [place] Phone [phone] Fax [phone] 02/03/2021 11:07 AM

## 2021-02-03 NOTE — Telephone Encounter (Signed)
Patient spoke to [employer] rep on 11/2 about approval and set up shipment to be delivered today, 02/03/21.   [place] Patient [place] Phone [phone] Fax [phone] 02/03/2021 12:38 PM

## 2021-02-03 NOTE — Telephone Encounter (Signed)
[name] Encounter  Met with patient and wife at appt to talk about Medicare Extra Help application requirement and re-enrolling for assistance for next year.   Patient's wife, Hassan Rowan, will call back with information to complete Extra Help application.  Will fax completed application to [place] once the HCP form is signed.   [place] Patient [place] Phone [phone] Fax [phone] 02/03/2021 12:34 PM

## 2021-02-03 NOTE — Progress Notes (Signed)
[place]  [phone] Fax:([phone]) [phone]  Patient Care Team: [name], MD as PCP - General (Family Medicine) [name], MD as Consulting Physician (Family Medicine) [name], [name], MD (General Surgery) [place], DO as Consulting Physician (Radiation Oncology)   Name of the patient: Curtis Andrade  [id number]  [birth year]   Date of visit: 02/03/21  HPI: Patient is a 75 y.o. 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 He was treated with Mekinist (trametinib) and Tafinlar (dabrafenib), but presented with pancreatitis. Treatment with dabrafenib and trametinib was stopped. The plan is to switch him to treatment with Mektovi (binimetinib) and Braftovi (encorafenib) (off label use, actively being studied in [place]).  Studies for encorafenib and binimetinib in NSCLC: ENCO-BRAF ([id number]) PHAROS ([id number])  Reason for Consult: Mektovi (binimetinib) and Braftovi (encorafenib) oral chemotherapy education.   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 Baruch Gouty and Dr. Nadeen Landau   Phlebitis 1998   Primary cancer of right upper lobe of lung ([place]) 01/14/2017   Dr. Genevive Bi performed a wedge resection of RUL.    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/27/2016    HEMATOLOGY/ONCOLOGY HISTORY:  Oncology History Overview Note  # OCT 2018- STAGE I- A. 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  - PARENCHYMAL MARGIN APPEARS CLEAR; TUMOR WAS 1 CM FROM MARGIN BEFORE REMOVAL OF STAPLE LINE.; NO adjuvant therapy.   # DEC 2021-- METASTATIC CARCINOMA,  COMPATIBLE WITH PULMONARY ADENOCARCINOMA [left shoulder Core Biopsy];  NOV 24th PET-right hilar and Hypermetabolic mediastinal lymphadenopathy; small pulmonary nodules; 2 metastatic lesions in the liver/  pancreatic head/left adrenal gland;  muscle and skeletal metastasis.  Subcutaneous nodule-positive for metastatic adeno lung.   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  Short course of Eliquis.smoking-quit 2008.  ---------------------------------------    DIAGNOSIS: LUNG CA  STAGE:  IV      ;GOALS:palliation  CURRENT/MOST RECENT THERAPY: DAB+TREM    Primary cancer of right upper lobe of lung ([place])  04/09/2020 Cancer Staging   Staging form: Lung, AJCC 8th Edition - Clinical: Stage IVB (cN1, cM1c) - Signed by Cammie Sickle, MD on 04/09/2020      ALLERGIES:  has No Known Allergies.  MEDICATIONS:  Current Outpatient Medications  Medication Sig Dispense Refill   Ascorbic Acid (VITAMIN C) 1000 MG tablet Take 1,000 mg by mouth daily.     aspirin EC 81 MG tablet Take 81 mg by mouth daily.     Calcium Carbonate-Vit D-Min (CALCIUM 1200 PO) Take 1 tablet by mouth daily.     folic acid (FOLVITE) 099 MCG tablet Take 800 mcg by mouth daily.     Multiple Vitamin (MULTIVITAMIN WITH MINERALS) TABS tablet Take 1 tablet by mouth daily. Senior Multivitamin.     Multiple Vitamins-Minerals (EMERGEN-C IMMUNE PO) Take 1 tablet by mouth 2 (two) times daily.  Omega-3 1000 MG CAPS Take 1 tablet by mouth 1 day or 1 dose.     omeprazole (PRILOSEC) 40 MG capsule Take by mouth.     No current facility-administered medications for this visit.    VITAL SIGNS: There were no  vitals taken for this visit. There were no vitals filed for this visit.  Estimated body mass index is 27.64 kg/m as calculated from the following:   Height as of 01/29/21: '5\' 11"'  (1.803 m).   Weight as of an earlier encounter on 02/03/21: 89.9 kg (198 lb 3.2 oz).  LABS: CBC:    Component Value Date/Time   WBC 6.2 02/03/2021 1031   HGB 14.0 02/03/2021 1031   HCT 43.5 02/03/2021 1031   PLT 203 02/03/2021 1031   MCV 101.2 (H) 02/03/2021 1031   NEUTROABS 3.6 02/03/2021 1031   LYMPHSABS 1.5 02/03/2021 1031   MONOABS 0.7 02/03/2021 1031   EOSABS 0.3 02/03/2021 1031   BASOSABS 0.1 02/03/2021 1031   Comprehensive Metabolic Panel:    Component Value Date/Time   NA 138 02/03/2021 1031   K 5.0 02/03/2021 1031   CL 102 02/03/2021 1031   CO2 28 02/03/2021 1031   BUN 15 02/03/2021 1031   CREATININE 0.94 02/03/2021 1031   GLUCOSE 117 (H) 02/03/2021 1031   CALCIUM 8.8 (L) 02/03/2021 1031   AST 26 02/03/2021 1031   ALT 19 02/03/2021 1031   ALKPHOS 54 02/03/2021 1031   BILITOT 0.8 02/03/2021 1031   PROT 7.1 02/03/2021 1031   ALBUMIN 3.7 02/03/2021 1031     Present during today's visit: patient and wife Curtis Andrade  Start plan: Encorafenib and binimetinib will be started tomorrow 02/04/21  Patient Education Reviewed again with [facility] administration, dosing, side effects, monitoring, drug-food interactions, safe handling, storage, and disposal. Patient will take: Encorafenib: Take 450 mg (six 75 mg capsules) by mouth once daily Binimetinib: Take 45 mg (three 15 mg tablets) by mouth twice daily  Side Effects: Side effects include but not limited to: changes in kidney function, fatigue, N/V, diarrhea, and hgb.    [facility] voiced understanding and appreciation. All questions answered. They feel ready to get started on this new treatment.   Patient expressed understanding and was in agreement with this plan. He also understands that He can call clinic at any time with any  questions, concerns, or complaints.   Medication Access Issues: Patient was approved for manufacturer assistance, his medications will be delivered today  Follow-up plan: RTC in 2 weeks  Thank you for allowing me to participate in the care of this patient.   Time Total: 15 mins  Visit consisted of counseling and education on dealing with issues of symptom management in the setting of serious and potentially life-threatening illness.Greater than 50%  of this time was spent counseling and coordinating care related to the above assessment and plan.  Signed by: Darl Pikes, PharmD, BCPS, Salley Slaughter, CPP Hematology/Oncology Clinical Pharmacist Practitioner [facility]/DB/AP Oral [facility] [phone]  02/03/2021 2:53 PM

## 2021-02-03 NOTE — Assessment & Plan Note (Addendum)
#   Stage IV/recurrent adenocarcinoma of the lung/ mediastinal lymphadenopathy/liver/  pancreatic head/left adrenal gland/ muscle and skeletal metastasis/Subcutaneous nodule/Brain- on B-RAF V600E- on dabrafenib plus Mekinist. AUG 12th, 2022- No evidence of lung cancer recurrence or progression; stable right hilar lymph node; stable sclerotic metastatic disease in the pelvis/spine.  However acute pancreatitis-likely secondary to DAB+MEK.  Discontinue DAB+MEK.  # plan starting using Enco+Bini.  Again discussed the potential therapies of diarrhea/rash and pancreatitis[< 10%].  Discussed with pharmacy.  Patient awaiting to start treatment today.  # acute pancreatitis-likely sec to B-raf inhibitor/Dabrafenib.  Monitor closely given plan to start on different but B-raf + MEK-I.   # Brain metastases: s/p WBRT [dec 2021];NOV 2022-interval improvement in numerous hemorrhagic metastases in the brain, which have decreased in conspicuity. No new lesions identified. No acute intracranial process.  # Bone lesions-mildly symptomatic; continue Zometa every 4-6 weeks [30 mins; tylenol]. with infusion premedicated with Tylenol at home.;HOLD Zometa for now/above acute issues.  # Fatigue appears multifactorial-s/p whole brain radiation; on chemotherapy; ? Zometa; ? Nocturia [see below]- STABLE  # PSA elevation: As per PCP-PSA- 4.47.  Question BPH causing nocturia /causing difficulty sleeping/fatigue. S/p evaluation with Urology.   # Vaccination: s/p COVID; HOLD booster; proceed with flu shot today.   # DISPOSITION: [mon/-wed appt] # HOLD Zometa;  # Flu shot today # follow up in 2 weeks  MD- labs- cbc/cmp;lipase/amylase- Dr.B.

## 2021-02-03 NOTE — Progress Notes (Signed)
[place] NOTE  Patient Care Team: [name], MD as PCP - General (Family Medicine) [name], MD as Consulting Physician (Family Medicine) [name], [name], MD (General Surgery) Thornton Park, DO as Consulting Physician (Radiation Oncology)  CHIEF COMPLAINTS/PURPOSE OF CONSULTATION: Lung cancer  #  Oncology History Overview Note  # OCT 2018- STAGE I- A. 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  - PARENCHYMAL MARGIN APPEARS CLEAR; TUMOR WAS 1 CM FROM MARGIN BEFORE REMOVAL OF STAPLE LINE.; NO adjuvant therapy.   # DEC 2021-- METASTATIC CARCINOMA, COMPATIBLE WITH PULMONARY ADENOCARCINOMA [left shoulder Core Biopsy];  NOV 24th PET-right hilar and Hypermetabolic mediastinal lymphadenopathy; small pulmonary nodules; 2 metastatic lesions in the liver/  pancreatic head/left adrenal gland;  muscle and skeletal metastasis.  Subcutaneous nodule-positive for metastatic adeno lung.   # DEC 2021-Brain MRI multiple subcentimeter enhancing brain mets no significant edema - NO SYMPTOMS;s/p RT eval [Dr.Blackburn/Crystal-s/p WBRT- JAN 11th, 2022]  # JAN 14th, 2021-START DABRFENIB +MEKINIST [name] 2022- MUGA scan-EF-58%]; STOPPED MID OCT 2022- sec to acute pancreatitis  # NOV 7th, 2022- Enco+Bini.   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  Short course of Eliquis.smoking-quit 2008.  ---------------------------------------    DIAGNOSIS: LUNG CA  STAGE:  IV      ;GOALS:palliation  CURRENT/MOST RECENT THERAPY: DAB+TREM    Primary cancer of right upper lobe of lung ([place])  04/09/2020 Cancer Staging   Staging form: Lung, AJCC 8th Edition - Clinical: Stage IVB (cN1, cM1c) - Signed by Cammie Sickle, MD on 04/09/2020       HISTORY OF PRESENTING ILLNESS: Ambulating  by independently.  Accompanied by his wife.  Curtis Andrade 75 y.o.  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  Patient currently feels significantly improved.  Is currently back on regular diet.  Is gaining weight.  No nausea no vomiting.  Abdominal pain.  Chronic mild fatigue.  Review of Systems  Constitutional:  Positive for malaise/fatigue. Negative for chills, diaphoresis, fever and weight loss.  HENT:  Negative for nosebleeds and sore throat.   Eyes:  Negative for double vision.  Respiratory:  Negative for cough, hemoptysis, sputum production, shortness of breath and wheezing.   Cardiovascular:  Negative for chest pain, palpitations, orthopnea and leg swelling.  Gastrointestinal:  Negative for abdominal pain, blood in stool, constipation, diarrhea, heartburn, melena, nausea and vomiting.  Genitourinary:  Negative for dysuria, frequency and urgency.  Musculoskeletal:  Positive for back pain and joint pain.  Skin: Negative.  Negative for itching and rash.  Neurological:  Positive for weakness. Negative for dizziness, tingling, focal weakness and headaches.  Endo/Heme/Allergies:  Does not bruise/bleed easily.  Psychiatric/Behavioral:  Positive for memory loss. Negative for depression. The patient is not nervous/anxious and does not have insomnia.     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 Baruch Gouty  and Dr. Nadeen Landau   Phlebitis 1998   Primary cancer of right upper lobe of lung ([place]) 01/14/2017   Dr. Genevive Bi  performed a wedge resection of RUL.    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/27/2016    SURGICAL HISTORY: Past Surgical History:  Procedure Laterality Date   COLONOSCOPY  2013   Dr Vira Agar   COLONOSCOPY WITH PROPOFOL N/A 12/23/2016   Procedure: COLONOSCOPY WITH PROPOFOL;  Surgeon: Robert Bellow, MD;  Location: [facility] ENDOSCOPY;  Service: Endoscopy;  Laterality: N/A;   EYE SURGERY     HERNIA REPAIR Right 1990's   TENDON REPAIR Left 03/14/2019   Procedure: TENDON REPAIR AND GRAFT  TRANSFER EXTENSOR INDICIS PROPRIUS LEFT THUMB;  Surgeon: [name], MD;  Location: [place];  Service: Orthopedics;  Laterality: Left;  AXILLARY BLOCK   THORACOTOMY Right 01/14/2017   Procedure: RIGHT THORACOSCOPYWITH WIDE WEDGE RESECTION,  PREOP BROCHOSCOPY;  Surgeon: [name], MD;  Location: [facility] ORS;  Service: General;  Laterality: Right;   THROAT SURGERY  2010    SOCIAL HISTORY: Social History   Socioeconomic History   Marital status: Married    Spouse name: Not on file   Number of children: Not on file   Years of education: Not on file   Highest education level: Not on file  Occupational History   Not on file  Tobacco Use   Smoking status: Former    Years: 50.00    Types: Cigarettes    Quit date: 03/31/2007    Years since quitting: 13.8   Smokeless tobacco: Never  Vaping Use   Vaping Use: Never used  Substance and Sexual Activity   Alcohol use: No   Drug use: No   Sexual activity: Yes    Birth control/protection: None    Comment: Married  Other Topics Concern   Not on file  Social History Narrative   Not on file   Social Determinants of Health   Financial Resource Strain: Not on file  Food Insecurity: Not on file  Transportation Needs: Not on file  Physical Activity: Not on file  Stress: Not on file  Social Connections: Not on file  Intimate Partner Violence: Not on file    FAMILY HISTORY: Family History  Problem Relation Age of Onset   Alzheimer's disease  Mother    Alzheimer's disease Father    Stroke Father    Colon cancer Neg Hx     ALLERGIES:  has No Known Allergies.  MEDICATIONS:  Current Outpatient Medications  Medication Sig Dispense Refill   Ascorbic Acid (VITAMIN C) 1000 MG tablet Take 1,000 mg by mouth daily.     aspirin EC 81 MG tablet Take 81 mg by mouth daily.     Calcium Carbonate-Vit D-Min (CALCIUM 1200 PO) Take 1 tablet by mouth daily.     folic acid (FOLVITE) 676 MCG tablet Take 800 mcg by mouth daily.     Multiple Vitamin (MULTIVITAMIN WITH MINERALS) TABS tablet Take 1 tablet by mouth daily. Senior Multivitamin.     Multiple Vitamins-Minerals (EMERGEN-C IMMUNE PO) Take 1 tablet by mouth 2 (two) times daily.     Omega-3 1000 MG CAPS Take 1 tablet by mouth 1 day or 1 dose.     omeprazole (PRILOSEC) 40 MG capsule Take by mouth.     binimetinib (MEKTOVI) 15 MG tablet Take 45 mg by mouth 2 (two) times daily.     encorafenib (BRAFTOVI) 75 MG capsule Take 450 mg by mouth daily.  No current facility-administered medications for this visit.      Marland Kitchen  PHYSICAL EXAMINATION: ECOG PERFORMANCE STATUS: 0 - Asymptomatic  Vitals:   02/03/21 1115  BP: 139/75  Pulse: 80  Resp: 19  Temp: 97.8 F (36.6 C)  SpO2: 99%    Filed Weights   02/03/21 1115  Weight: 198 lb 3.2 oz (89.9 kg)     Physical Exam HENT:     Head: Normocephalic and atraumatic.     Mouth/Throat:     Pharynx: No oropharyngeal exudate.  Eyes:     Pupils: Pupils are equal, round, and reactive to light.  Cardiovascular:     Rate and Rhythm: Normal rate and regular rhythm.  Pulmonary:     Effort: Pulmonary effort is normal. No respiratory distress.     Breath sounds: Normal breath sounds. No wheezing.  Abdominal:     General: Bowel sounds are normal. There is no distension.     Palpations: Abdomen is soft. There is no mass.     Tenderness: There is no abdominal tenderness. There is no guarding or rebound.  Musculoskeletal:        General: No  tenderness. Normal range of motion.     Cervical back: Normal range of motion and neck supple.  Skin:    General: Skin is warm.  Neurological:     Mental Status: He is alert and oriented to person, place, and time.  Psychiatric:        Mood and Affect: Affect normal.  ;  LABORATORY DATA:  I have reviewed the data as listed Lab Results  Component Value Date   WBC 6.2 02/03/2021   HGB 14.0 02/03/2021   HCT 43.5 02/03/2021   MCV 101.2 (H) 02/03/2021   PLT 203 02/03/2021   Recent Labs    01/13/21 1303 01/22/21 1018 02/03/21 1031  NA 131* 136 138  K 3.4* 4.9 5.0  CL 97* 100 102  CO2 25 29 28   GLUCOSE 120* 118* 117*  BUN 11 8 15   CREATININE 0.96 1.15 0.94  CALCIUM 7.2* 9.0 8.8*  GFRNONAA >60 >60 >60  PROT 7.2 7.2 7.1  ALBUMIN 3.3* 3.4* 3.7  AST 21 24 26   ALT 15 15 19   ALKPHOS 84 65 54  BILITOT 0.9 0.5 0.8    RADIOGRAPHIC STUDIES: I have personally reviewed the radiological images as listed and agreed with the findings in the report. MR BRAIN W WO CONTRAST  Result Date: 02/01/2021 CLINICAL DATA:  Lung cancer, evaluate for brain metastases, fatigue EXAM: MRI HEAD WITHOUT AND WITH CONTRAST TECHNIQUE: Multiplanar, multiecho pulse sequences of the brain and surrounding structures were obtained without and with intravenous contrast. CONTRAST:  7.49mL GADAVIST GADOBUTROL 1 MMOL/ML IV SOLN COMPARISON:  06/06/2020 FINDINGS: Brain: No restricted diffusion to suggest acute infarct or nonenhancing lesion. No abnormal enhancement. Previously noted hemorrhagic metastatic foci in the brain are further decreased in conspicuity. Some of these demonstrate minimal surrounding intrinsic T1 hyperintense signal but no definite enhancement. No new or enlarging lesions. No significant mass effect or edema. No extra-axial collection or hydrocephalus. Vascular: Normal flow voids. Skull and upper cervical spine: No osseous lesions identified. Normal marrow signal. Sinuses/Orbits: Negative.  Status post  bilateral lens replacements. Other: Fluid in the right-greater-than-left mastoid air cells. IMPRESSION: Further interval improvement in numerous hemorrhagic metastases in the brain, which have decreased in conspicuity. No new lesions identified. No acute intracranial process. Electronically Signed   By: Merilyn Baba M.D.   On: 02/01/2021 12:16  ASSESSMENT & PLAN:   Primary cancer of right upper lobe of lung ([place]) # Stage IV/recurrent adenocarcinoma of the lung/ mediastinal lymphadenopathy/liver/  pancreatic head/left adrenal gland/ muscle and skeletal metastasis/Subcutaneous nodule/Brain- on B-RAF V600E- on dabrafenib plus Mekinist. AUG 12th, 2022- No evidence of lung cancer recurrence or progression; stable right hilar lymph node; stable sclerotic metastatic disease in the pelvis/spine.  However acute pancreatitis-likely secondary to DAB+MEK.  Discontinue DAB+MEK.  # plan starting using Enco+Bini.  Again discussed the potential therapies of diarrhea/rash and pancreatitis[< 10%].  Discussed with pharmacy.  Patient awaiting to start treatment today.  # acute pancreatitis-likely sec to B-raf inhibitor/Dabrafenib.  Monitor closely given plan to start on different but B-raf + MEK-I.   # Brain metastases: s/p WBRT [dec 2021];NOV 2022-interval improvement in numerous hemorrhagic metastases in the brain, which have decreased in conspicuity. No new lesions identified. No acute intracranial process.  # Bone lesions-mildly symptomatic; continue Zometa every 4-6 weeks [30 mins; tylenol]. with infusion premedicated with Tylenol at home.;HOLD Zometa for now/above acute issues.  # Fatigue appears multifactorial-s/p whole brain radiation; on chemotherapy; ? Zometa; ? Nocturia [see below]- STABLE  # PSA elevation: As per PCP-PSA- 4.47.  Question BPH causing nocturia /causing difficulty sleeping/fatigue. S/p evaluation with Urology.   # Vaccination: s/p COVID; HOLD booster; proceed with flu shot today.   #  DISPOSITION: [mon/-wed appt] # HOLD Zometa;  # Flu shot today # follow up in 2 weeks  MD- labs- cbc/cmp;lipase/amylase- Dr.B.    All questions were answered. The patient knows to call the clinic with any problems, questions or concerns.    Cammie Sickle, MD 02/04/2021 8:38 AM

## 2021-02-04 ENCOUNTER — Encounter: Payer: Self-pay | Admitting: Internal Medicine

## 2021-02-07 ENCOUNTER — Telehealth: Payer: Self-pay | Admitting: *Deleted

## 2021-02-07 ENCOUNTER — Encounter: Payer: Self-pay | Admitting: Pharmacy Technician

## 2021-02-07 ENCOUNTER — Other Ambulatory Visit: Payer: Self-pay | Admitting: Internal Medicine

## 2021-02-07 DIAGNOSIS — C3411 Malignant neoplasm of upper lobe, right bronchus or lung: Secondary | ICD-10-CM

## 2021-02-07 MED ORDER — BINIMETINIB 15 MG PO TABS: 45.0000 mg | ORAL_TABLET | Freq: Two times a day (BID) | ORAL | 2 refills | Status: DC

## 2021-02-07 NOTE — Telephone Encounter (Signed)
Per Bethena Roys- pt only rcd 15 days supply of mektovi. New script sent to [facility] pharmacy

## 2021-02-07 NOTE — Progress Notes (Signed)
Prescription sent as requested by pharmacy.

## 2021-02-17 NOTE — Telephone Encounter (Signed)
Spoke with Mr Denherder over the phone on 02/07/21 to complete the Medicare Extra Help form online.  Patient will notify me when they receive the determination letter.  [place] Patient [place] Phone [phone] Fax [phone] 02/17/2021 9:23 AM

## 2021-02-19 ENCOUNTER — Inpatient Hospital Stay: Payer: PPO | Admitting: Internal Medicine

## 2021-02-19 ENCOUNTER — Inpatient Hospital Stay: Payer: PPO

## 2021-02-19 ENCOUNTER — Other Ambulatory Visit: Payer: Self-pay

## 2021-02-19 DIAGNOSIS — C3411 Malignant neoplasm of upper lobe, right bronchus or lung: Secondary | ICD-10-CM | POA: Diagnosis not present

## 2021-02-19 DIAGNOSIS — Z7189 Other specified counseling: Secondary | ICD-10-CM

## 2021-02-19 LAB — CBC WITH DIFFERENTIAL/PLATELET
Abs Immature Granulocytes: 0.02 10*3/uL (ref 0.00–0.07)
Basophils Absolute: 0 10*3/uL (ref 0.0–0.1)
Basophils Relative: 1 %
Eosinophils Absolute: 0.3 10*3/uL (ref 0.0–0.5)
Eosinophils Relative: 4 %
HCT: 44.7 % (ref 39.0–52.0)
Hemoglobin: 14.6 g/dL (ref 13.0–17.0)
Immature Granulocytes: 0 %
Lymphocytes Relative: 24 %
Lymphs Abs: 1.5 10*3/uL (ref 0.7–4.0)
MCH: 32.5 pg (ref 26.0–34.0)
MCHC: 32.7 g/dL (ref 30.0–36.0)
MCV: 99.6 fL (ref 80.0–100.0)
Monocytes Absolute: 0.5 10*3/uL (ref 0.1–1.0)
Monocytes Relative: 8 %
Neutro Abs: 3.8 10*3/uL (ref 1.7–7.7)
Neutrophils Relative %: 63 %
Platelets: 186 10*3/uL (ref 150–400)
RBC: 4.49 MIL/uL (ref 4.22–5.81)
RDW: 13.8 % (ref 11.5–15.5)
WBC: 6.2 10*3/uL (ref 4.0–10.5)
nRBC: 0 % (ref 0.0–0.2)

## 2021-02-19 LAB — COMPREHENSIVE METABOLIC PANEL
ALT: 29 U/L (ref 0–44)
AST: 34 U/L (ref 15–41)
Albumin: 4.2 g/dL (ref 3.5–5.0)
Alkaline Phosphatase: 40 U/L (ref 38–126)
Anion gap: 8 (ref 5–15)
BUN: 16 mg/dL (ref 8–23)
CO2: 28 mmol/L (ref 22–32)
Calcium: 9.1 mg/dL (ref 8.9–10.3)
Chloride: 101 mmol/L (ref 98–111)
Creatinine, Ser: 1.13 mg/dL (ref 0.61–1.24)
GFR, Estimated: >60 mL/min (ref >60)
Glucose, Bld: 128 mg/dL — ABNORMAL HIGH (ref 70–99)
Potassium: 4.6 mmol/L (ref 3.5–5.1)
Sodium: 137 mmol/L (ref 135–145)
Total Bilirubin: 0.4 mg/dL (ref 0.3–1.2)
Total Protein: 7.3 g/dL (ref 6.5–8.1)

## 2021-02-19 NOTE — Progress Notes (Signed)
[place] NOTE  Patient Care Team: [name], MD as PCP - General (Family Medicine) [name], MD as Consulting Physician (Family Medicine) [name], [name], MD (General Surgery) Thornton Park, DO as Consulting Physician (Radiation Oncology)  CHIEF COMPLAINTS/PURPOSE OF CONSULTATION: Lung cancer  #  Oncology History Overview Note  # OCT 2018- STAGE I- A. 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  - PARENCHYMAL MARGIN APPEARS CLEAR; TUMOR WAS 1 CM FROM MARGIN BEFORE REMOVAL OF STAPLE LINE.; NO adjuvant therapy.   # DEC 2021-- METASTATIC CARCINOMA, COMPATIBLE WITH PULMONARY ADENOCARCINOMA [left shoulder Core Biopsy];  NOV 24th PET-right hilar and Hypermetabolic mediastinal lymphadenopathy; small pulmonary nodules; 2 metastatic lesions in the liver/  pancreatic head/left adrenal gland;  muscle and skeletal metastasis.  Subcutaneous nodule-positive for metastatic adeno lung.   # DEC 2021-Brain MRI multiple subcentimeter enhancing brain mets no significant edema - NO SYMPTOMS;s/p RT eval [Dr.Blackburn/Crystal-s/p WBRT- JAN 11th, 2022]  # JAN 14th, 2021-START DABRFENIB +MEKINIST [name] 2022- MUGA scan-EF-58%]; STOPPED MID OCT 2022- sec to acute pancreatitis  # NOV 7th, 2022- Enco+Bini.   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  Short course of Eliquis.smoking-quit 2008.  ---------------------------------------    DIAGNOSIS: LUNG CA  STAGE:  IV      ;GOALS:palliation  CURRENT/MOST RECENT THERAPY: DAB+TREM    Primary cancer of right upper lobe of lung ([place])  04/09/2020 Cancer Staging   Staging form: Lung, AJCC 8th Edition - Clinical: Stage IVB (cN1, cM1c) - Signed by Cammie Sickle, MD on 04/09/2020       HISTORY OF PRESENTING ILLNESS: Ambulating  by independently.  Alone.   Curtis Andrade 75 y.o.  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  DAB+MEK was discontinued about 5 weeks ago because of acute pancreatitis. Patient denies abdominal pain or nausea vomiting on the new regimen.   Complains of mild to moderate fatigue.  Otherwise no worsening joint pains fevers or chills.  Denies any headache.  Review of Systems  Constitutional:  Positive for malaise/fatigue. Negative for chills, diaphoresis, fever and weight loss.  HENT:  Negative for nosebleeds and sore throat.   Eyes:  Negative for double vision.  Respiratory:  Negative for cough, hemoptysis, sputum production, shortness of breath and wheezing.   Cardiovascular:  Negative for chest pain, palpitations, orthopnea and leg swelling.  Gastrointestinal:  Negative for abdominal pain, blood in stool, constipation, diarrhea, heartburn, melena, nausea and vomiting.  Genitourinary:  Negative for dysuria, frequency and urgency.  Musculoskeletal:  Positive for back pain and joint pain.  Skin: Negative.  Negative for itching and rash.  Neurological:  Positive for weakness. Negative for dizziness, tingling, focal weakness and headaches.  Endo/Heme/Allergies:  Does not bruise/bleed easily.  Psychiatric/Behavioral:  Positive for memory loss. Negative for depression. The patient is not nervous/anxious and does not have insomnia.     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 Baruch Gouty and Dr.  [place]   Phlebitis 1998   Primary cancer of right upper lobe of lung ([place]) 01/14/2017   Dr. Genevive Bi performed a wedge  resection of RUL.    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/27/2016    SURGICAL HISTORY: Past Surgical History:  Procedure Laterality Date   COLONOSCOPY  2013   Dr Vira Agar   COLONOSCOPY WITH PROPOFOL N/A 12/23/2016   Procedure: COLONOSCOPY WITH PROPOFOL;  Surgeon: Robert Bellow, MD;  [facility] ENDOSCOPY;  Service: Endoscopy;  Laterality: N/A;   EYE SURGERY     HERNIA REPAIR Right 1990's   TENDON REPAIR Left 03/14/2019   Procedure: TENDON REPAIR AND GRAFT  TRANSFER EXTENSOR INDICIS PROPRIUS LEFT THUMB;  Surgeon: [name], MD;  Location: [facility];  Service: Orthopedics;  Laterality: Left;  AXILLARY BLOCK   THORACOTOMY Right 01/14/2017   Procedure: RIGHT THORACOSCOPYWITH WIDE WEDGE RESECTION,  PREOP BROCHOSCOPY;  Surgeon: [name], MD;  Location: [facility] ORS;  Service: General;  Laterality: Right;   THROAT SURGERY  2010    SOCIAL HISTORY: Social History   Socioeconomic History   Marital status: Married    Spouse name: Not on file   Number of children: Not on file   Years of education: Not on file   Highest education level: Not on file  Occupational History   Not on file  Tobacco Use   Smoking status: Former    Years: 50.00    Types: Cigarettes    Quit date: 03/31/2007    Years since quitting: 13.9   Smokeless tobacco: Never  Vaping Use   Vaping Use: Never used  Substance and Sexual Activity   Alcohol use: No   Drug use: No   Sexual activity: Yes    Birth control/protection: None    Comment: Married  Other Topics Concern   Not on file  Social History Narrative   Not on file   Social Determinants of Health   Financial Resource Strain: Not on file  Food Insecurity: Not on file  Transportation Needs: Not on file  Physical Activity: Not on file  Stress: Not on file  Social Connections: Not on file  Intimate Partner Violence: Not on file    FAMILY HISTORY: Family History  Problem Relation Age of Onset   Alzheimer's disease Mother    Alzheimer's  disease Father    Stroke Father    Colon cancer Neg Hx     ALLERGIES:  has No Known Allergies.  MEDICATIONS:  Current Outpatient Medications  Medication Sig Dispense Refill   Ascorbic Acid (VITAMIN C) 1000 MG tablet Take 1,000 mg by mouth daily.     aspirin EC 81 MG tablet Take 81 mg by mouth daily.     binimetinib (MEKTOVI) 15 MG tablet Take 3 tablets (45 mg total) by mouth 2 (two) times daily. 180 tablet 2   Calcium Carbonate-Vit D-Min (CALCIUM 1200 PO) Take 1 tablet by mouth daily.     encorafenib (BRAFTOVI) 75 MG capsule Take 450 mg by mouth daily.     folic acid (FOLVITE) 150 MCG tablet Take 800 mcg by mouth daily.     Multiple Vitamin (MULTIVITAMIN WITH MINERALS) TABS tablet Take 1 tablet by mouth daily. Senior Multivitamin.     Multiple Vitamins-Minerals (EMERGEN-C IMMUNE PO) Take 1 tablet by mouth 2 (two) times daily.     Omega-3 1000 MG CAPS Take 1 tablet by mouth 1 day or 1 dose.     omeprazole (PRILOSEC) 40 MG capsule Take by mouth.  No current facility-administered medications for this visit.      Marland Kitchen  PHYSICAL EXAMINATION: ECOG PERFORMANCE STATUS: 0 - Asymptomatic  Vitals:   02/19/21 1103  BP: (!) 142/80  Pulse: 89  Resp: 18  Temp: 98.2 F (36.8 C)  SpO2: 100%    Filed Weights   02/19/21 1103  Weight: 198 lb (89.8 kg)     Physical Exam HENT:     Head: Normocephalic and atraumatic.     Mouth/Throat:     Pharynx: No oropharyngeal exudate.  Eyes:     Pupils: Pupils are equal, round, and reactive to light.  Cardiovascular:     Rate and Rhythm: Normal rate and regular rhythm.  Pulmonary:     Effort: Pulmonary effort is normal. No respiratory distress.     Breath sounds: Normal breath sounds. No wheezing.  Abdominal:     General: Bowel sounds are normal. There is no distension.     Palpations: Abdomen is soft. There is no mass.     Tenderness: There is no abdominal tenderness. There is no guarding or rebound.  Musculoskeletal:        General: No  tenderness. Normal range of motion.     Cervical back: Normal range of motion and neck supple.  Skin:    General: Skin is warm.  Neurological:     Mental Status: He is alert and oriented to person, place, and time.  Psychiatric:        Mood and Affect: Affect normal.  ;  LABORATORY DATA:  I have reviewed the data as listed Lab Results  Component Value Date   WBC 6.2 02/19/2021   HGB 14.6 02/19/2021   HCT 44.7 02/19/2021   MCV 99.6 02/19/2021   PLT 186 02/19/2021   Recent Labs    01/22/21 1018 02/03/21 1031 02/19/21 1052  NA 136 138 137  K 4.9 5.0 4.6  CL 100 102 101  CO2 _0 GLUCOSE 118* 117* 128*  BUN _1 CREATININE 1.15 0.94 1.13  CALCIUM 9.0 8.8* 9.1  GFRNONAA >60 >60 >60  PROT 7.2 7.1 7.3  ALBUMIN 3.4* 3.7 4.2  AST 24 26 34  ALT _2 ALKPHOS 65 54 40  BILITOT 0.5 0.8 0.4    RADIOGRAPHIC STUDIES: I have personally reviewed the radiological images as listed and agreed with the findings in the report. MR BRAIN W WO CONTRAST  Result Date: 02/01/2021 CLINICAL DATA:  Lung cancer, evaluate for brain metastases, fatigue EXAM: MRI HEAD WITHOUT AND WITH CONTRAST TECHNIQUE: Multiplanar, multiecho pulse sequences of the brain and surrounding structures were obtained without and with intravenous contrast. CONTRAST:  7.43m GADAVIST GADOBUTROL 1 MMOL/ML IV SOLN COMPARISON:  06/06/2020 FINDINGS: Brain: No restricted diffusion to suggest acute infarct or nonenhancing lesion. No abnormal enhancement. Previously noted hemorrhagic metastatic foci in the brain are further decreased in conspicuity. Some of these demonstrate minimal surrounding intrinsic T1 hyperintense signal but no definite enhancement. No new or enlarging lesions. No significant mass effect or edema. No extra-axial collection or hydrocephalus. Vascular: Normal flow voids. Skull and upper cervical spine: No osseous lesions identified. Normal marrow signal. Sinuses/Orbits: Negative.  Status post bilateral  lens replacements. Other: Fluid in the right-greater-than-left mastoid air cells. IMPRESSION: Further interval improvement in numerous hemorrhagic metastases in the brain, which have decreased in conspicuity. No new lesions identified. No acute intracranial process. Electronically Signed   By: AMerilyn BabaM.D.   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  AUG 12th, 2022- No evidence of lung cancer recurrence or progression; stable right hilar lymph node; stable sclerotic metastatic disease in the pelvis/spine. Currently on B-RAF V600E inhibitor [discon-dabrafenib plus Mekinist- in Oct, 2022- sec to pancreatitis] Started on 7th NOV, 2022- Enco+Bini].  # Currently on Enco+Bini.  Tolerating well without any significant side effects of fever rash or diarrhea.  No clinical signs of acute pancreatitis.  We will plan to order imaging at next visit.  # acute pancreatitis-likely sec to B-raf inhibitor/Dabrafenib.  Monitor closely  On Enco+Bini.  # Brain metastases: s/p WBRT [dec 2021];NOV 2022-interval improvement in numerous hemorrhagic metastases in the brain, which have decreased in conspicuity. No new lesions identified. No acute intracranial process.  # Bone lesions-mildly symptomatic; continue Zometa every 4-6 weeks [30 mins; tylenol]. with infusion premedicated with Tylenol at home.;HOLD Zometa for now/above acute issues.  # Fatigue appears multifactorial-s/p whole brain radiation; on chemotherapy; ? Zometa; ? Nocturia [see below]- STABLE  # PSA elevation: As per PCP-PSA- 4.47.  Question BPH causing nocturia /causing difficulty sleeping/fatigue. S/p evaluation with Urology.   # Vaccination: s/p COVID; HOLD booster; proceed with flu shot today.   # DISPOSITION: [mon/-wed appt]  # follow up in week of Dec 26th, 2022- - labs-  cbc/cmp;lipase/amylase- Dr.B.     All questions were answered. The patient knows to call the clinic with any problems, questions or concerns.    Cammie Sickle, MD 02/19/2021 1:00 PM

## 2021-02-19 NOTE — Assessment & Plan Note (Addendum)
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  AUG 12th, 2022- No evidence of lung cancer recurrence or progression; stable right hilar lymph node; stable sclerotic metastatic disease in the pelvis/spine. Currently on B-RAF V600E inhibitor [discon-dabrafenib plus Mekinist- in Oct, 2022- sec to pancreatitis] Started on 7th NOV, 2022- Enco+Bini].  # Currently on Enco+Bini.  Tolerating well without any significant side effects of fever rash or diarrhea.  No clinical signs of acute pancreatitis.  We will plan to order imaging at next visit.  # acute pancreatitis-likely sec to B-raf inhibitor/Dabrafenib.  Monitor closely  On Enco+Bini.  # Brain metastases: s/p WBRT [dec 2021];NOV 2022-interval improvement in numerous hemorrhagic metastases in the brain, which have decreased in conspicuity. No new lesions identified. No acute intracranial process.  # Bone lesions-mildly symptomatic; continue Zometa every 4-6 weeks [30 mins; tylenol]. with infusion premedicated with Tylenol at home.;HOLD Zometa for now/above acute issues.  # Fatigue appears multifactorial-s/p whole brain radiation; on chemotherapy; ? Zometa; ? Nocturia [see below]- STABLE  # PSA elevation: As per PCP-PSA- 4.47.  Question BPH causing nocturia /causing difficulty sleeping/fatigue. S/p evaluation with Urology.   # Vaccination: s/p COVID; HOLD booster; proceed with flu shot today.   # DISPOSITION: [mon/-wed appt]  # follow up in week of Dec 26th, 2022- - labs- cbc/cmp;lipase/amylase- Dr.B.

## 2021-02-19 NOTE — Progress Notes (Signed)
Patient has not concerns or questions today

## 2021-02-21 LAB — AMYLASE: Amylase: 35 U/L (ref 28–100)

## 2021-02-21 LAB — LIPASE, BLOOD: Lipase: 27 U/L (ref 11–51)

## 2021-03-10 ENCOUNTER — Encounter: Payer: Self-pay | Admitting: Internal Medicine

## 2021-03-26 ENCOUNTER — Inpatient Hospital Stay: Payer: PPO

## 2021-03-26 ENCOUNTER — Encounter: Payer: Self-pay | Admitting: Internal Medicine

## 2021-03-26 ENCOUNTER — Other Ambulatory Visit: Payer: Self-pay

## 2021-03-26 ENCOUNTER — Inpatient Hospital Stay: Payer: PPO | Attending: Internal Medicine | Admitting: Internal Medicine

## 2021-03-26 VITALS — BP 145/86 | HR 78 | Temp 97.5°F | Ht 71.0 in | Wt 201.2 lb

## 2021-03-26 DIAGNOSIS — C7951 Secondary malignant neoplasm of bone: Secondary | ICD-10-CM | POA: Diagnosis not present

## 2021-03-26 DIAGNOSIS — Z8521 Personal history of malignant neoplasm of larynx: Secondary | ICD-10-CM | POA: Diagnosis not present

## 2021-03-26 DIAGNOSIS — Z79899 Other long term (current) drug therapy: Secondary | ICD-10-CM | POA: Insufficient documentation

## 2021-03-26 DIAGNOSIS — C3411 Malignant neoplasm of upper lobe, right bronchus or lung: Secondary | ICD-10-CM

## 2021-03-26 DIAGNOSIS — C7931 Secondary malignant neoplasm of brain: Secondary | ICD-10-CM | POA: Diagnosis not present

## 2021-03-26 DIAGNOSIS — Z923 Personal history of irradiation: Secondary | ICD-10-CM | POA: Diagnosis not present

## 2021-03-26 LAB — COMPREHENSIVE METABOLIC PANEL
ALT: 22 U/L (ref 0–44)
AST: 28 U/L (ref 15–41)
Albumin: 3.8 g/dL (ref 3.5–5.0)
Alkaline Phosphatase: 41 U/L (ref 38–126)
Anion gap: 8 (ref 5–15)
BUN: 14 mg/dL (ref 8–23)
CO2: 30 mmol/L (ref 22–32)
Calcium: 9.3 mg/dL (ref 8.9–10.3)
Chloride: 101 mmol/L (ref 98–111)
Creatinine, Ser: 1.06 mg/dL (ref 0.61–1.24)
GFR, Estimated: >60 mL/min (ref >60)
Glucose, Bld: 108 mg/dL — ABNORMAL HIGH (ref 70–99)
Potassium: 4.3 mmol/L (ref 3.5–5.1)
Sodium: 139 mmol/L (ref 135–145)
Total Bilirubin: 0.5 mg/dL (ref 0.3–1.2)
Total Protein: 7 g/dL (ref 6.5–8.1)

## 2021-03-26 LAB — CBC WITH DIFFERENTIAL/PLATELET
Abs Immature Granulocytes: 0.01 10*3/uL (ref 0.00–0.07)
Basophils Absolute: 0.1 10*3/uL (ref 0.0–0.1)
Basophils Relative: 1 %
Eosinophils Absolute: 0.2 10*3/uL (ref 0.0–0.5)
Eosinophils Relative: 5 %
HCT: 42.9 % (ref 39.0–52.0)
Hemoglobin: 13.9 g/dL (ref 13.0–17.0)
Immature Granulocytes: 0 %
Lymphocytes Relative: 28 %
Lymphs Abs: 1.4 10*3/uL (ref 0.7–4.0)
MCH: 32 pg (ref 26.0–34.0)
MCHC: 32.4 g/dL (ref 30.0–36.0)
MCV: 98.6 fL (ref 80.0–100.0)
Monocytes Absolute: 0.5 10*3/uL (ref 0.1–1.0)
Monocytes Relative: 10 %
Neutro Abs: 2.8 10*3/uL (ref 1.7–7.7)
Neutrophils Relative %: 56 %
Platelets: 191 10*3/uL (ref 150–400)
RBC: 4.35 MIL/uL (ref 4.22–5.81)
RDW: 14.3 % (ref 11.5–15.5)
WBC: 5 10*3/uL (ref 4.0–10.5)
nRBC: 0 % (ref 0.0–0.2)

## 2021-03-26 LAB — AMYLASE: Amylase: 33 U/L (ref 28–100)

## 2021-03-26 LAB — LIPASE, BLOOD: Lipase: 25 U/L (ref 11–51)

## 2021-03-26 NOTE — Assessment & Plan Note (Addendum)
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  AUG 12th, 2022- No evidence of lung cancer recurrence or progression; stable right hilar lymph node; stable sclerotic metastatic disease in the pelvis/spine. Currently on B-RAF V600E inhibitor [discon-dabrafenib plus Mekinist- in Oct, 2022- sec to pancreatitis] currently on started on 7th NOV, 2022- Enco+Bini]. ° °# Currently on Enco+Bini.  Tolerating well without any significant side effects of fever rash or diarrhea.  No clinical signs of acute pancreatitis.  We will plan to order imaging at next visit. ° °#Questionable episode of seizure-manifested by chills/rigors [no fevers]/Nausea/vomitting/ loss of memory [x 24 hours]-recommend stat MRI brain.  Will refer to Dr. Vaslow/neurooncology.  Also discussed regarding considering starting antiepileptic medication ° °# acute pancreatitis-likely sec to B-raf inhibitor/Dabrafenib.  Monitor closely  On Enco+Bini. ° °# Brain metastases: s/p WBRT [dec 2021];NOV 2022-interval improvement in numerous hemorrhagic metastases in the brain, which have decreased in conspicuity. No new lesions identified. No acute intracranial process- however, see above.  ° °# Bone lesions-mildly symptomatic; continue Zometa every 4-6 weeks [30 mins; tylenol]. with infusion premedicated with Tylenol at home.;HOLD Zometa for now/above acute issues. ° °# PSA elevation: As per PCP-PSA- 4.47.  Question BPH causing nocturia /causing difficulty sleeping/fatigue. S/p evaluation with Urology.  ° °# Vaccination: s/p COVID; HOLD booster; proceed with flu shot today.  ° °# DISPOSITION: [mon/-wed appt] °# STAT MRI Brain °# referral to Dr.Vaslow/ re: hx of brain metastases/? Seizures-This Friday if possible.  °# follow up in 3 weeks - labs- cbc/cmp; CT CAP- Dr.B. ° ° ° °

## 2021-03-26 NOTE — Progress Notes (Signed)
[facility] NOTE  Patient Care Team: [name], MD as PCP - General (Family Medicine) [name], MD as Consulting Physician (Family Medicine) [name], [name], MD (General Surgery) Thornton Park, DO as Consulting Physician (Radiation Oncology)  CHIEF COMPLAINTS/PURPOSE OF CONSULTATION: Lung cancer  #  Oncology History Overview Note  # OCT 2018- STAGE I- A. 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  - PARENCHYMAL MARGIN APPEARS CLEAR; TUMOR WAS 1 CM FROM MARGIN BEFORE REMOVAL OF STAPLE LINE.; NO adjuvant therapy.   # DEC 2021-- METASTATIC CARCINOMA, COMPATIBLE WITH PULMONARY ADENOCARCINOMA [left shoulder Core Biopsy];  NOV 24th PET-right hilar and Hypermetabolic mediastinal lymphadenopathy; small pulmonary nodules; 2 metastatic lesions in the liver/  pancreatic head/left adrenal gland;  muscle and skeletal metastasis.  Subcutaneous nodule-positive for metastatic adeno lung.   # DEC 2021-Brain MRI multiple subcentimeter enhancing brain mets no significant edema - NO SYMPTOMS;s/p RT eval [Dr.Blackburn/Crystal-s/p WBRT- JAN 11th, 2022]  # JAN 14th, 2021-START DABRFENIB +MEKINIST [name] 2022- MUGA scan-EF-58%]; STOPPED MID OCT 2022- sec to acute pancreatitis  # NOV 7th, 2022- Enco+Bini.   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  Short course of Eliquis.smoking-quit 2008.  ---------------------------------------    DIAGNOSIS: LUNG CA  STAGE:  IV      ;GOALS:palliation     Primary cancer of right upper lobe of lung ([place])  04/09/2020 Cancer Staging   Staging form: Lung, AJCC 8th Edition - Clinical: Stage IVB (cN1, cM1c) - Signed by Cammie Sickle, MD on 04/09/2020       HISTORY OF PRESENTING ILLNESS: Ambulating by independently.  Accompanied by his  wife.  Daughter on the phone with  Eliam Snapp. 75 y.o.  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  Dec 4th- had episode of chills/rigors/ nausea/vomiting/ cognitive loss/altered mental status x 24 hours-  resolved spontaneously.  Currently patient denies any headache.  Denies any further episodes.  Back to his baseline.  Patient denies abdominal pain or nausea vomiting on the new regimen.   Review of Systems  Constitutional:  Positive for malaise/fatigue. Negative for chills, diaphoresis, fever and weight loss.  HENT:  Negative for nosebleeds and sore throat.   Eyes:  Negative for double vision.  Respiratory:  Negative for cough, hemoptysis, sputum production, shortness of breath and wheezing.   Cardiovascular:  Negative for chest pain, palpitations, orthopnea and leg swelling.  Gastrointestinal:  Negative for abdominal pain, blood in stool, constipation, diarrhea, heartburn, melena, nausea and vomiting.  Genitourinary:  Negative for dysuria, frequency and urgency.  Musculoskeletal:  Positive for back pain and joint pain.  Skin: Negative.  Negative for itching and rash.  Neurological:  Positive for weakness. Negative for dizziness, tingling, focal weakness and headaches.  Endo/Heme/Allergies:  Does not bruise/bleed easily.  Psychiatric/Behavioral:  Positive for memory loss. Negative for depression. The patient is not nervous/anxious and does not have insomnia.     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 Baruch Gouty and Dr. Nadeen Landau   Phlebitis 1998   Primary cancer of right upper lobe of lung ([place])  01/14/2017   Dr. Genevive Bi performed a wedge resection of RUL.    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/27/2016    SURGICAL HISTORY: Past Surgical History:  Procedure Laterality Date   COLONOSCOPY  2013   Dr Vira Agar   COLONOSCOPY WITH PROPOFOL N/A 12/23/2016   Procedure: COLONOSCOPY WITH PROPOFOL;  Surgeon: Robert Bellow, MD;  Location: [facility] ENDOSCOPY;  Service: Endoscopy;  Laterality: N/A;   EYE SURGERY     HERNIA REPAIR Right 1990's   TENDON REPAIR Left 03/14/2019   Procedure: TENDON REPAIR AND GRAFT  TRANSFER EXTENSOR INDICIS PROPRIUS LEFT THUMB;  Surgeon: [name], MD;  Location: [facility];  Service: Orthopedics;  Laterality: Left;  AXILLARY BLOCK   THORACOTOMY Right 01/14/2017   Procedure: RIGHT THORACOSCOPYWITH WIDE WEDGE RESECTION,  PREOP BROCHOSCOPY;  Surgeon: [name], MD;  Location: [facility] ORS;  Service: General;  Laterality: Right;   THROAT SURGERY  2010    SOCIAL HISTORY: Social History   Socioeconomic History   Marital status: Married    Spouse name: Not on file   Number of children: Not on file   Years of education: Not on file   Highest education level: Not on file  Occupational History   Not on file  Tobacco Use   Smoking status: Former    Years: 50.00    Types: Cigarettes    Quit date: 03/31/2007    Years since quitting: 13.9   Smokeless tobacco: Never  Vaping Use   Vaping Use: Never used  Substance and Sexual Activity   Alcohol use: No   Drug use: No   Sexual activity: Yes    Birth control/protection: None    Comment: Married  Other Topics Concern   Not on file  Social History Narrative   Not on file   Social Determinants of Health   Financial Resource Strain: Not on file  Food Insecurity: Not on file  Transportation Needs: Not on file  Physical Activity: Not on file  Stress: Not on file  Social Connections: Not on file  Intimate Partner Violence: Not on file    FAMILY HISTORY: Family History  Problem Relation Age of Onset    Alzheimer's disease Mother    Alzheimer's disease Father    Stroke Father    Colon cancer Neg Hx     ALLERGIES:  has No Known Allergies.  MEDICATIONS:  Current Outpatient Medications  Medication Sig Dispense Refill   Ascorbic Acid (VITAMIN C) 1000 MG tablet Take 1,000 mg by mouth daily.     aspirin EC 81 MG tablet Take 81 mg by mouth daily.     binimetinib (MEKTOVI) 15 MG tablet Take 3 tablets (45 mg total) by mouth 2 (two) times daily. 180 tablet 2   Calcium Carbonate-Vit D-Min (CALCIUM 1200 PO) Take 1 tablet by mouth daily.     encorafenib (BRAFTOVI) 75 MG capsule Take 450 mg by mouth daily.     folic acid (FOLVITE) 898 MCG tablet Take 800 mcg by mouth daily.     Multiple Vitamin (MULTIVITAMIN WITH MINERALS) TABS tablet Take 1 tablet by mouth daily. Senior Multivitamin.     Multiple Vitamins-Minerals (EMERGEN-C IMMUNE PO) Take 1 tablet by mouth 2 (two) times daily.     Omega-3 1000 MG CAPS Take 1 tablet by mouth 1 day or 1 dose.     omeprazole (PRILOSEC) 40  MG capsule Take by mouth.     No current facility-administered medications for this visit.      Marland Kitchen  PHYSICAL EXAMINATION: ECOG PERFORMANCE STATUS: 0 - Asymptomatic  Vitals:   03/26/21 1048  BP: (!) 145/86  Pulse: 78  Temp: (!) 97.5 F (36.4 C)  SpO2: 99%    Filed Weights   03/26/21 1048  Weight: 201 lb 3.2 oz (91.3 kg)     Physical Exam HENT:     Head: Normocephalic and atraumatic.     Mouth/Throat:     Pharynx: No oropharyngeal exudate.  Eyes:     Pupils: Pupils are equal, round, and reactive to light.  Cardiovascular:     Rate and Rhythm: Normal rate and regular rhythm.  Pulmonary:     Effort: Pulmonary effort is normal. No respiratory distress.     Breath sounds: Normal breath sounds. No wheezing.  Abdominal:     General: Bowel sounds are normal. There is no distension.     Palpations: Abdomen is soft. There is no mass.     Tenderness: There is no abdominal tenderness. There is no guarding or  rebound.  Musculoskeletal:        General: No tenderness. Normal range of motion.     Cervical back: Normal range of motion and neck supple.  Skin:    General: Skin is warm.  Neurological:     Mental Status: He is alert and oriented to person, place, and time.  Psychiatric:        Mood and Affect: Affect normal.  ;  LABORATORY DATA:  I have reviewed the data as listed Lab Results  Component Value Date   WBC 5.0 03/26/2021   HGB 13.9 03/26/2021   HCT 42.9 03/26/2021   MCV 98.6 03/26/2021   PLT 191 03/26/2021   Recent Labs    02/03/21 1031 02/19/21 1052 03/26/21 1022  NA 138 137 139  K 5.0 4.6 4.3  CL 102 101 101  CO2 _0 GLUCOSE 117* 128* 108*  BUN _1 CREATININE 0.94 1.13 1.06  CALCIUM 8.8* 9.1 9.3  GFRNONAA >60 >60 >60  PROT 7.1 7.3 7.0  ALBUMIN 3.7 4.2 3.8  AST 26 34 28  ALT _2 ALKPHOS 54 40 41  BILITOT 0.8 0.4 0.5    RADIOGRAPHIC STUDIES: I have personally reviewed the radiological images as listed and agreed with the findings in the report. No results found.  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  AUG 12th, 2022- No evidence of lung cancer recurrence or progression; stable right hilar lymph node; stable sclerotic metastatic disease in the pelvis/spine. Currently on B-RAF V600E inhibitor [discon-dabrafenib plus Mekinist- in Oct, 2022- sec to pancreatitis] currently on started on 7th NOV, 2022- Enco+Bini].  # Currently on Enco+Bini.  Tolerating well without any significant side effects of fever rash or diarrhea.  No clinical signs of acute pancreatitis.  We will plan to order imaging at next visit.  #Questionable episode of seizure-manifested by chills/rigors [no fevers]/Nausea/vomitting/ loss of memory [x 24 hours]-recommend stat MRI brain.  Will refer to Dr. Vaslow/neurooncology.  Also  discussed regarding considering starting antiepileptic medication  # acute pancreatitis-likely sec to B-raf inhibitor/Dabrafenib.  Monitor closely  On Enco+Bini.  # Brain metastases: s/p WBRT [dec 2021];NOV 2022-interval improvement in numerous hemorrhagic metastases in the brain, which  have decreased in conspicuity. No new lesions identified. No acute intracranial process- however, see above.   # Bone lesions-mildly symptomatic; continue Zometa every 4-6 weeks [30 mins; tylenol]. with infusion premedicated with Tylenol at home.;HOLD Zometa for now/above acute issues.  # PSA elevation: As per PCP-PSA- 4.47.  Question BPH causing nocturia /causing difficulty sleeping/fatigue. S/p evaluation with Urology.   # Vaccination: s/p COVID; HOLD booster; proceed with flu shot today.   # DISPOSITION: [mon/-wed appt] # STAT MRI Brain # referral to Dr.Vaslow/ re: hx of brain metastases/? Seizures-This Friday if possible.  # follow up in 3 weeks - labs- cbc/cmp; CT CAP- Dr.B.      All questions were answered. The patient knows to call the clinic with any problems, questions or concerns.    Cammie Sickle, MD 03/26/2021 12:28 PM

## 2021-03-26 NOTE — Addendum Note (Signed)
Addended by: Vanice Sarah on: 03/26/2021 12:48 PM   Modules accepted: Orders

## 2021-03-27 ENCOUNTER — Ambulatory Visit
Admission: RE | Admit: 2021-03-27 | Discharge: 2021-03-27 | Disposition: A | Discharge status: Home / Self Care | Payer: PPO | Source: Ambulatory Visit | Attending: Internal Medicine | Admitting: Internal Medicine

## 2021-03-27 DIAGNOSIS — C7931 Secondary malignant neoplasm of brain: Secondary | ICD-10-CM | POA: Insufficient documentation

## 2021-03-27 DIAGNOSIS — C3411 Malignant neoplasm of upper lobe, right bronchus or lung: Secondary | ICD-10-CM | POA: Diagnosis not present

## 2021-03-27 DIAGNOSIS — G9389 Other specified disorders of brain: Secondary | ICD-10-CM | POA: Diagnosis not present

## 2021-03-27 IMAGING — MR MR HEAD WO/W CM
16 series · 48 of 48 positions shown · IV contrast (9ml Gadavist)
Comparison: [DATE], [DATE], [DATE]

CLINICAL DATA: Lung cancer with brain metastases, assess treatment
response

EXAM:
MRI HEAD WITHOUT AND WITH CONTRAST
TECHNIQUE: Multiplanar, multiecho pulse sequences of the brain and surrounding
structures were obtained without and with intravenous contrast.
CONTRAST:  9mL GADAVIST GADOBUTROL 1 MMOL/ML IV SOLN

[Series 5: ax dwi_tracew · axial · 3.0mm · 0.65mm/px · z∈[-75,+80]mm · 3 of 48 slices shown]
[im 1/48]
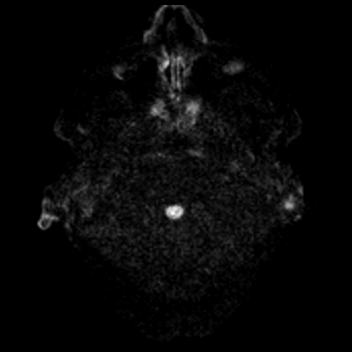
[im 24/48]
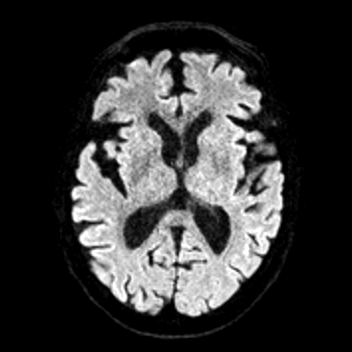
[im 48/48]
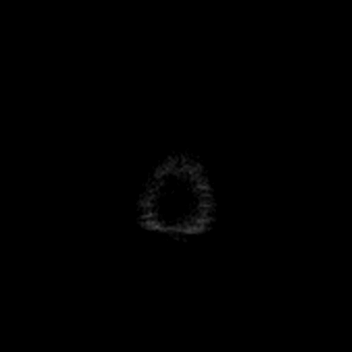

[Series 6: ax dwi_adc · axial · 3.0mm · 0.65mm/px · z∈[-75,+80]mm · 3 of 48 slices shown]
[im 1/48]
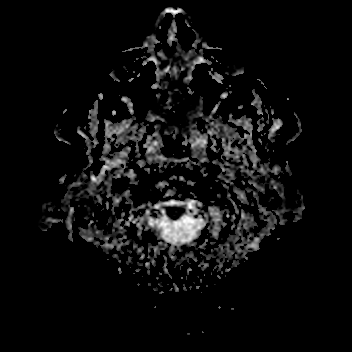
[im 24/48]
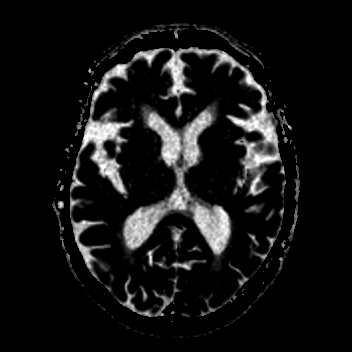
[im 48/48]
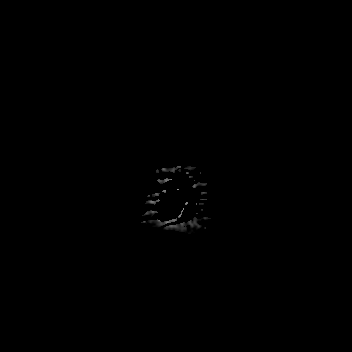

[Series 7: cor dwi_tracew · coronal · 5.0mm · 0.65mm/px · 2 of 40 slices shown]
[im 1/40]
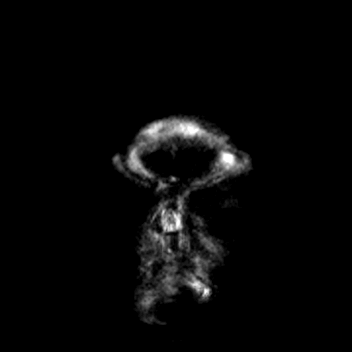
[im 40/40]
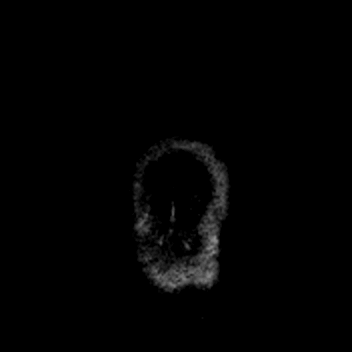

[Series 8: cor dwi_adc · coronal · 5.0mm · 0.65mm/px · 2 of 40 slices shown]
[im 1/40]
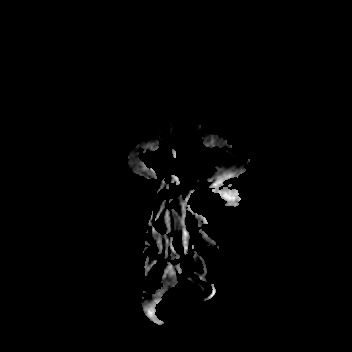
[im 40/40]
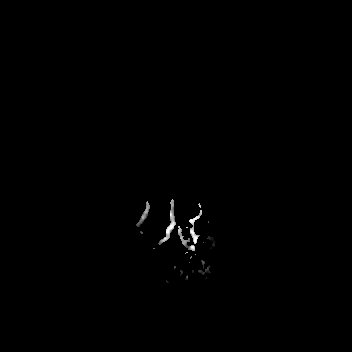

[Series 9: T1 · sagittal · 5.0mm · 0.62mm/px · 1 of 23 slices shown (1 of 3)]
[im 1/23]
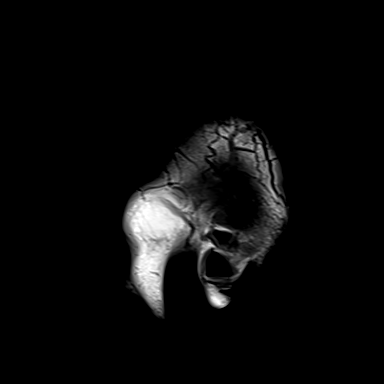

[Series 10: T2 · axial · 5.0mm · 0.53mm/px · 1 of 27 slices shown]
[im 1/27]
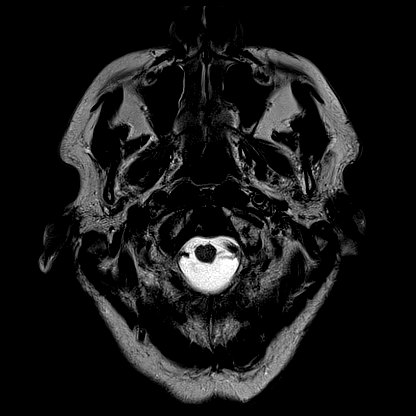

[Series 11: mag_images · axial · 3.0mm · 0.90mm/px · z∈[-86,+91]mm · 3 of 60 slices shown]
[im 1/60]
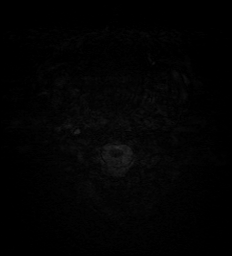
[im 30/60]
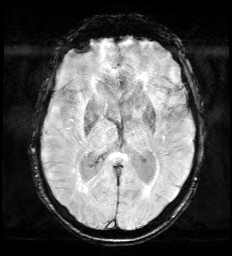
[im 60/60]
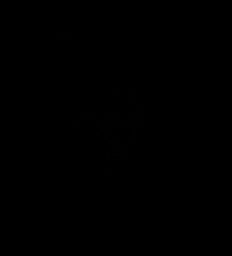

[Series 12: pha_images · axial · 3.0mm · 0.90mm/px · z∈[-86,+88]mm · 3 of 56 slices shown]
[im 1/56]
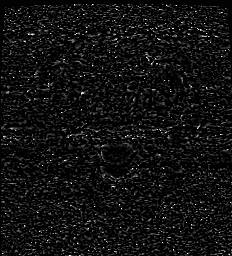
[im 28/56]
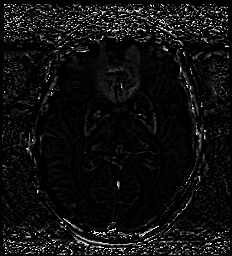
[im 56/56]
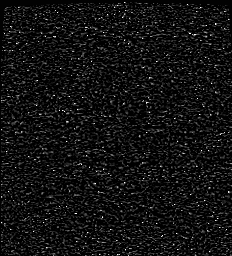

[Series 13: swi_images · axial · 3.0mm · 0.90mm/px · z∈[-86,+91]mm · 3 of 60 slices shown]
[im 1/60]
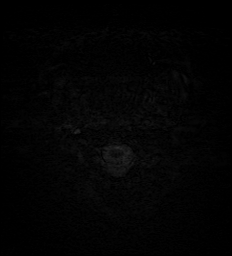
[im 30/60]
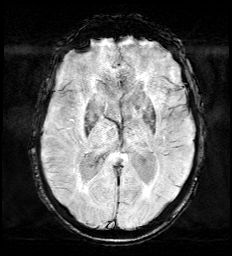
[im 60/60]
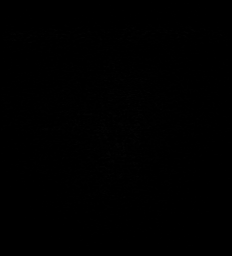

[Series 15: FLAIR · axial · 3.0mm · 0.53mm/px · z∈[-79,+83]mm · 3 of 55 slices shown]
[im 1/55]
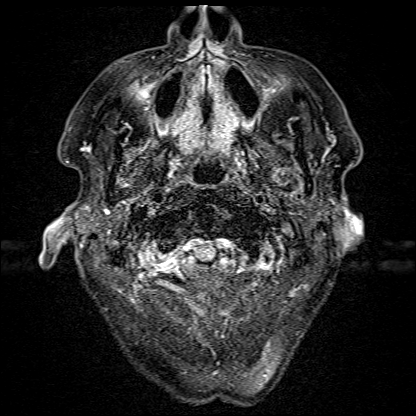
[im 28/55]
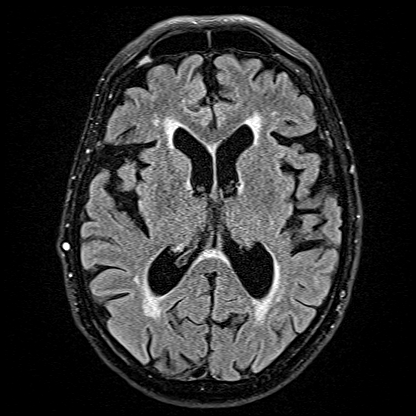
[im 55/55]
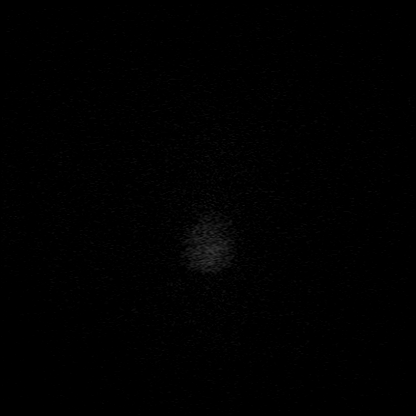

[Series 16: T1 · axial · 1.0mm · 0.98mm/px · z∈[-84,+90]mm · 9 of 176 slices shown (2 of 3)]
[im 1/176]
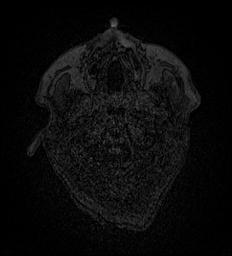
[im 22/176]
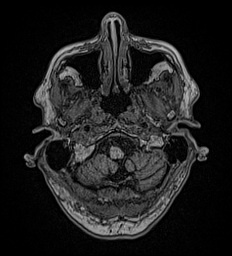
[im 44/176]
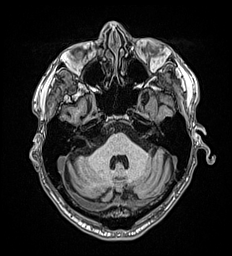
[im 66/176]
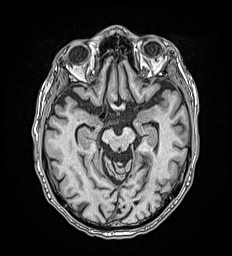
[im 88/176]
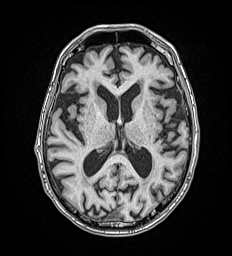
[im 110/176]
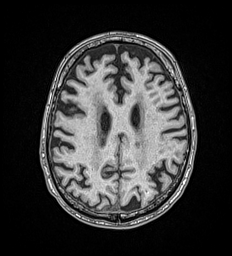
[im 132/176]
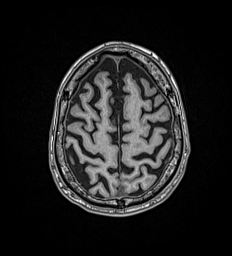
[im 154/176]
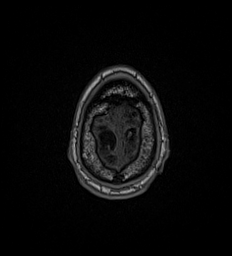
[im 176/176]
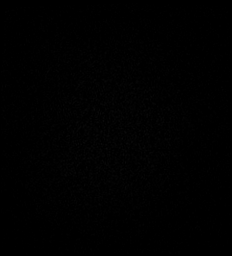

[Series 17: T1 · sagittal · 5.0mm · 0.62mm/px · 1 of 23 slices shown (3 of 3)]
[im 1/23]
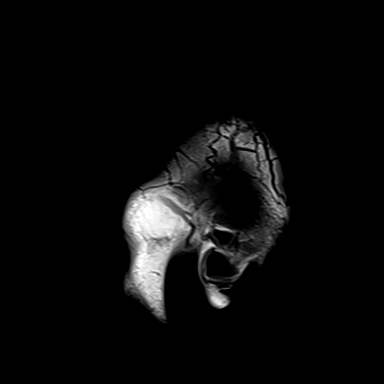

[Series 18: T2 post-contrast · coronal · 5.0mm · 0.57mm/px · 2 of 29 slices shown]
[im 1/29]
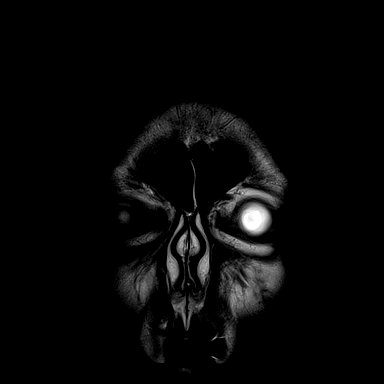
[im 29/29]
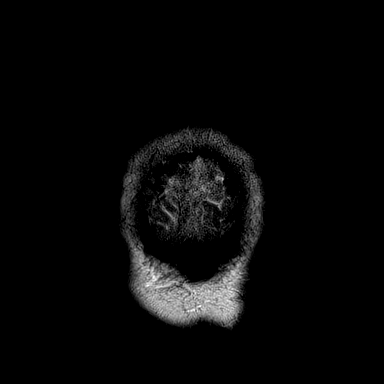

[Series 19: T1 post-contrast · axial · 1.0mm · 0.98mm/px · z∈[-84,+90]mm · 9 of 176 slices shown (1 of 3)]
[im 1/176]
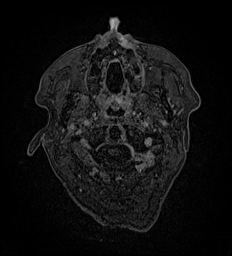
[im 22/176]
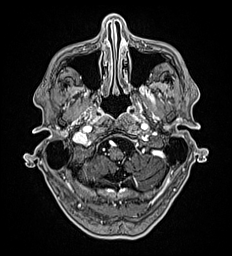
[im 44/176]
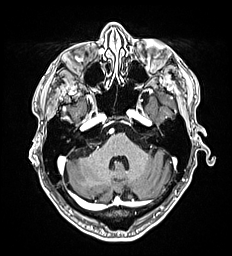
[im 66/176]
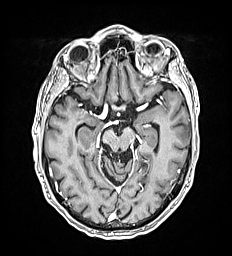
[im 88/176]
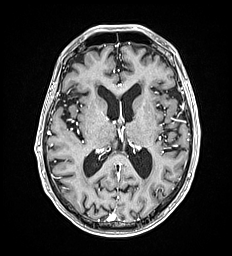
[im 110/176]
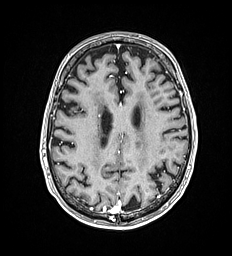
[im 132/176]
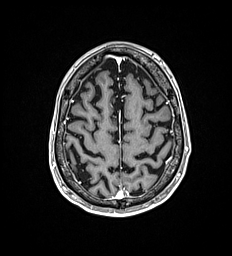
[im 154/176]
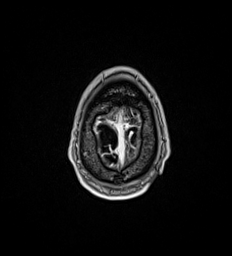
[im 176/176]
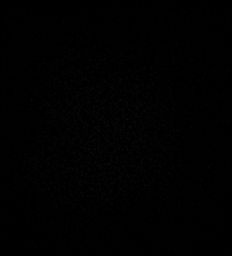

[Series 20: T1 post-contrast · coronal · 5.0mm · 0.57mm/px · 2 of 29 slices shown (2 of 3)]
[im 1/29]
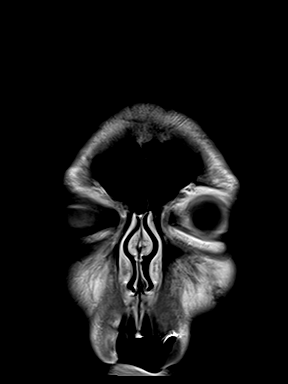
[im 29/29]
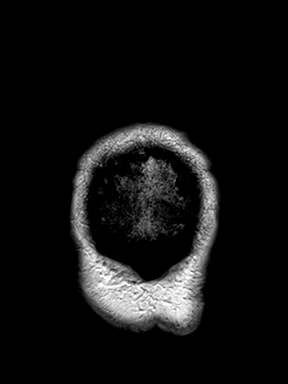

[Series 21: T1 post-contrast · sagittal · 5.0mm · 0.62mm/px · 1 of 23 slices shown (3 of 3)]
[im 1/23]
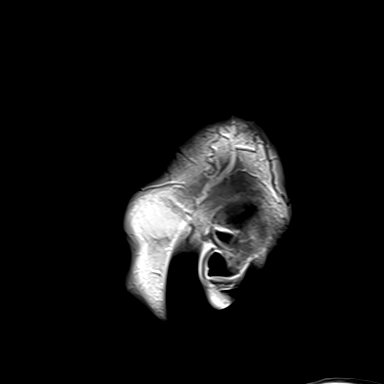

[48 of 48 positions shown; findings below may reference images not displayed]

FINDINGS: Brain: No restricted diffusion to suggest acute infarct, subacute
infarct, or nonenhancing metastatic lesion. No new areas of abnormal
enhancement or enlarging lesions. Previously noted hemorrhagic
metastatic foci are either no longer seen or are represented by
small areas of intrinsic T1 hyperintense signal surrounding a focus
of hemosiderin deposition (for example series 19, images 37, 69,
112).

No acute hemorrhage, cerebral edema, mass effect, or midline shift.
No hydrocephalus or extra-axial collection. T2 hyperintense signal
in the periventricular white matter, likely the sequela of chronic
small vessel ischemic disease and/or radiation treatment.

Vascular: Normal flow voids.

Skull and upper cervical spine: Normal marrow signal.

Sinuses/Orbits: No acute finding. Status post bilateral lens
replacements.

Other: Trace fluid in bilateral mastoid air cells.
IMPRESSION: No acute intracranial process or evidence of new metastatic disease
in the brain. Previously noted metastatic lesions are either no
longer seen or are unchanged foci of hemosiderin deposition.

## 2021-03-27 MED ORDER — GADOBUTROL 1 MMOL/ML IV SOLN
9.0000 mL | Freq: Once | INTRAVENOUS | Status: AC | PRN
  Administered 2021-03-27: 12:00:00 9 mL via INTRAVENOUS

## 2021-03-28 ENCOUNTER — Encounter: Payer: Self-pay | Admitting: Internal Medicine

## 2021-03-28 NOTE — Progress Notes (Signed)
I spoke to patient regarding results of MRI- no evidence of progression or new disease.   However if patient has a similar episode/seizure he should call his emergently or go to the ER.  Recommend follow up with Dr. Mickeal Skinner as planned

## 2021-04-02 NOTE — Telephone Encounter (Signed)
Patient called to inform me that he was denied Medicare Extra Help.  He will bring in his denial letter next week for me to forward to [employer].  [place] [facility] [place] Phone [phone] Fax [phone] 04/02/2021 2:24 PM

## 2021-04-11 ENCOUNTER — Inpatient Hospital Stay: Payer: PPO | Attending: Internal Medicine | Admitting: Internal Medicine

## 2021-04-11 ENCOUNTER — Other Ambulatory Visit: Payer: Self-pay

## 2021-04-11 DIAGNOSIS — Z8521 Personal history of malignant neoplasm of larynx: Secondary | ICD-10-CM | POA: Diagnosis not present

## 2021-04-11 DIAGNOSIS — Z923 Personal history of irradiation: Secondary | ICD-10-CM | POA: Diagnosis not present

## 2021-04-11 DIAGNOSIS — K5730 Diverticulosis of large intestine without perforation or abscess without bleeding: Secondary | ICD-10-CM | POA: Insufficient documentation

## 2021-04-11 DIAGNOSIS — Z818 Family history of other mental and behavioral disorders: Secondary | ICD-10-CM | POA: Diagnosis not present

## 2021-04-11 DIAGNOSIS — R413 Other amnesia: Secondary | ICD-10-CM | POA: Diagnosis not present

## 2021-04-11 DIAGNOSIS — C7931 Secondary malignant neoplasm of brain: Secondary | ICD-10-CM | POA: Insufficient documentation

## 2021-04-11 DIAGNOSIS — Z87891 Personal history of nicotine dependence: Secondary | ICD-10-CM | POA: Insufficient documentation

## 2021-04-11 DIAGNOSIS — K8020 Calculus of gallbladder without cholecystitis without obstruction: Secondary | ICD-10-CM | POA: Diagnosis not present

## 2021-04-11 DIAGNOSIS — K859 Acute pancreatitis without necrosis or infection, unspecified: Secondary | ICD-10-CM | POA: Diagnosis not present

## 2021-04-11 DIAGNOSIS — I700 Atherosclerosis of aorta: Secondary | ICD-10-CM | POA: Diagnosis not present

## 2021-04-11 DIAGNOSIS — Z8719 Personal history of other diseases of the digestive system: Secondary | ICD-10-CM | POA: Insufficient documentation

## 2021-04-11 DIAGNOSIS — C3411 Malignant neoplasm of upper lobe, right bronchus or lung: Secondary | ICD-10-CM | POA: Diagnosis not present

## 2021-04-11 DIAGNOSIS — R531 Weakness: Secondary | ICD-10-CM | POA: Diagnosis not present

## 2021-04-11 DIAGNOSIS — C787 Secondary malignant neoplasm of liver and intrahepatic bile duct: Secondary | ICD-10-CM | POA: Diagnosis not present

## 2021-04-11 DIAGNOSIS — C7951 Secondary malignant neoplasm of bone: Secondary | ICD-10-CM | POA: Diagnosis not present

## 2021-04-11 DIAGNOSIS — M549 Dorsalgia, unspecified: Secondary | ICD-10-CM | POA: Diagnosis not present

## 2021-04-11 DIAGNOSIS — R5383 Other fatigue: Secondary | ICD-10-CM | POA: Insufficient documentation

## 2021-04-11 DIAGNOSIS — N200 Calculus of kidney: Secondary | ICD-10-CM | POA: Diagnosis not present

## 2021-04-11 DIAGNOSIS — Z79899 Other long term (current) drug therapy: Secondary | ICD-10-CM | POA: Diagnosis not present

## 2021-04-11 DIAGNOSIS — R569 Unspecified convulsions: Secondary | ICD-10-CM | POA: Insufficient documentation

## 2021-04-11 DIAGNOSIS — Z823 Family history of stroke: Secondary | ICD-10-CM | POA: Insufficient documentation

## 2021-04-11 DIAGNOSIS — M2550 Pain in unspecified joint: Secondary | ICD-10-CM | POA: Diagnosis not present

## 2021-04-11 DIAGNOSIS — I7143 Infrarenal abdominal aortic aneurysm, without rupture: Secondary | ICD-10-CM | POA: Insufficient documentation

## 2021-04-11 MED ORDER — LEVETIRACETAM 500 MG PO TABS: 500.0000 mg | ORAL_TABLET | Freq: Two times a day (BID) | ORAL | 3 refills | Status: DC

## 2021-04-11 NOTE — Progress Notes (Signed)
[facility] [place], [place] [address] [phone]   New Patient Evaluation  Date of Service: 04/11/21 Patient Name: Curtis Andrade. Patient MRN: [id number] Patient DOB: 1945-09-14 Provider: Ventura Sellers, MD  Identifying Statement:  Curtis Andrade. is a 76 y.o. male with Brain metastases [facility]) - Plan: MR BRAIN W WO CONTRAST  Focal seizure ([place]) who presents for initial consultation and evaluation regarding cancer associated neurologic deficits.    Referring Provider: [name], MD [address] [place],  [place] [address]  Primary Cancer:  Oncologic History: Oncology History Overview Note  # OCT 2018- STAGE I- A. 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  - PARENCHYMAL MARGIN APPEARS CLEAR; TUMOR WAS 1 CM FROM MARGIN BEFORE REMOVAL OF STAPLE LINE.; NO adjuvant therapy.   # DEC 2021-- METASTATIC CARCINOMA, COMPATIBLE WITH PULMONARY ADENOCARCINOMA [left shoulder Core Biopsy];  NOV 24th PET-right hilar and Hypermetabolic mediastinal lymphadenopathy; small pulmonary nodules; 2 metastatic lesions in the liver/  pancreatic head/left adrenal gland;  muscle and skeletal metastasis.  Subcutaneous nodule-positive for metastatic adeno lung.   # DEC 2021-Brain MRI multiple subcentimeter enhancing brain mets no significant edema - NO SYMPTOMS;s/p RT eval [Dr.Blackburn/[facility]-s/p WBRT- JAN 11th, 2022]  # JAN 14th, 2021-START DABRFENIB +MEKINIST [name] 2022- MUGA scan-EF-58%]; STOPPED MID OCT 2022- sec to acute pancreatitis  # NOV 7th, 2022- Enco+Bini.   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  Short course of Eliquis.smoking-quit 2008.  ---------------------------------------    DIAGNOSIS: LUNG CA  STAGE:  IV       ;GOALS:palliation     Primary cancer of right upper lobe of lung ([place])  04/09/2020 Cancer Staging   Staging form: Lung, AJCC 8th Edition - Clinical: Stage IVB (cN1, cM1c) - Signed by Cammie Sickle, MD on 04/09/2020     CNS Oncologic History 04/09/21: Completes WBRT for 11+ visible metastases ([place])  History of Present Illness: The patient's records from the referring physician were obtained and reviewed and the patient interviewed to confirm this HPI.  [facility] [place] [facility] today to review recent neurologic episode.  His wife describes episode of sudden confusion, shaking, disorientation, roughly one month ago.  His "whole body was shaking", which he remembers, but shortly after that became confused and non-sensical, which improved slowly over several days.  He describes a period of almost 24 hours with "total blackout, no memory".  Since then, he has been back at his normal baseline, with no recurrence.  He completed whole brain radiation ~1 year ago with Dr. Donella Stade for brain metastases.  Continues on oral TKIs for BRAF+ lung cancer with Dr. Rogue Bussing.  Medications: Current Outpatient Medications on File Prior to Visit  Medication Sig Dispense Refill   Ascorbic Acid (VITAMIN C) 1000 MG tablet Take 1,000 mg by mouth daily.     aspirin EC 81 MG tablet Take 81 mg by mouth daily.     binimetinib (MEKTOVI) 15 MG tablet Take 3 tablets (45 mg total) by mouth 2 (two) times daily. 180 tablet 2   Calcium Carbonate-Vit D-Min (CALCIUM 1200 PO) Take 1 tablet by mouth daily.     encorafenib (BRAFTOVI) 75 MG capsule Take 450 mg by mouth daily.     folic acid (FOLVITE) 952 MCG tablet Take  800 mcg by mouth daily.     Multiple Vitamin (MULTIVITAMIN WITH MINERALS) TABS tablet Take 1 tablet by mouth daily. Senior Multivitamin.     Multiple Vitamins-Minerals (EMERGEN-C IMMUNE PO) Take 1 tablet by mouth 2 (two) times daily.     Omega-3 1000 MG CAPS Take 1 tablet by mouth 1 day or 1 dose.      omeprazole (PRILOSEC) 40 MG capsule Take by mouth.     No current facility-administered medications on file prior to visit.    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 Baruch Gouty and Dr. Nadeen Landau   Phlebitis 1998   Primary cancer of right upper lobe of lung ([place]) 01/14/2017   Dr. Genevive Bi performed a wedge resection of RUL.    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/27/2016   Past Surgical History:  Past Surgical History:  Procedure Laterality Date   COLONOSCOPY  2013   Dr Vira Agar   COLONOSCOPY WITH PROPOFOL N/A 12/23/2016   Procedure: COLONOSCOPY WITH PROPOFOL;  Surgeon: Robert Bellow, MD;  Location: [facility];  Service: Endoscopy;  Laterality: N/A;   EYE SURGERY     HERNIA REPAIR Right 1990's   TENDON REPAIR Left 03/14/2019   Procedure: TENDON REPAIR AND GRAFT  TRANSFER EXTENSOR INDICIS PROPRIUS LEFT THUMB;  Surgeon: [name], MD;  Location: [facility];  Service: Orthopedics;  Laterality: Left;  AXILLARY BLOCK   THORACOTOMY Right 01/14/2017   Procedure: RIGHT THORACOSCOPYWITH WIDE WEDGE RESECTION,  PREOP BROCHOSCOPY;  Surgeon: [name], MD;  Location: [facility] ORS;  Service: General;  Laterality: Right;   THROAT SURGERY  2010   Social History:  Social History   Socioeconomic History   Marital status: Married    Spouse name: Not on file   Number of children: Not on file   Years of education: Not on file   Highest education level: Not on file  Occupational History   Not on file  Tobacco Use   Smoking status: Former    Years: 50.00    Types: Cigarettes    Quit date: 03/31/2007    Years since quitting: 14.0   Smokeless tobacco: Never  Vaping Use   Vaping Use:  Never used  Substance and Sexual Activity   Alcohol use: No   Drug use: No   Sexual activity: Yes    Birth control/protection: None    Comment: Married  Other Topics Concern   Not on file  Social History Narrative   Not on file   Social Determinants of Health   Financial Resource Strain: Not on file  Food Insecurity: Not on file  Transportation Needs: Not on file  Physical Activity: Not on file  Stress: Not on file  Social Connections: Not on file  Intimate Partner Violence: Not on file   Family History:  Family History  Problem Relation Age of Onset   Alzheimer's disease Mother    Alzheimer's disease Father    Stroke Father    Colon cancer Neg Hx     Review of Systems: Constitutional: Doesn't report fevers, chills or abnormal weight loss Eyes: Doesn't report blurriness of vision Ears, nose, mouth, throat, and face: Doesn't report sore throat Respiratory: Doesn't  report cough, dyspnea or wheezes Cardiovascular: Doesn't report palpitation, chest discomfort  Gastrointestinal:  Doesn't report nausea, constipation, diarrhea GU: Doesn't report incontinence Skin: Doesn't report skin rashes Neurological: Per HPI Musculoskeletal: Doesn't report joint pain Behavioral/Psych: Doesn't report anxiety  Physical Exam: Vitals:   04/11/21 0905  BP: (!) 145/77  Pulse: 78  Resp: 16  Temp: (!) 97.3 F (36.3 C)  SpO2: 99%   KPS: 90. General: Alert, cooperative, pleasant, in no acute distress Head: Normal EENT: No conjunctival injection or scleral icterus.  Lungs: Resp effort normal Cardiac: Regular rate Abdomen: Non-distended abdomen Skin: No rashes cyanosis or petechiae. Extremities: No clubbing or edema  Neurologic Exam: Mental Status: Awake, alert, attentive to examiner. Oriented to self and environment. Language is fluent with intact comprehension.  Cranial Nerves: Visual acuity is grossly normal. Visual fields are full. Extra-ocular movements intact. No ptosis. Face  is symmetric Motor: Tone and bulk are normal. Power is full in both arms and legs. Reflexes are symmetric, no pathologic reflexes present.  Sensory: Intact to light touch Gait: Normal.   Labs: I have reviewed the data as listed    Component Value Date/Time   NA 139 03/26/2021 1022   K 4.3 03/26/2021 1022   CL 101 03/26/2021 1022   CO2 30 03/26/2021 1022   GLUCOSE 108 (H) 03/26/2021 1022   BUN 14 03/26/2021 1022   CREATININE 1.06 03/26/2021 1022   CALCIUM 9.3 03/26/2021 1022   PROT 7.0 03/26/2021 1022   ALBUMIN 3.8 03/26/2021 1022   AST 28 03/26/2021 1022   ALT 22 03/26/2021 1022   ALKPHOS 41 03/26/2021 1022   BILITOT 0.5 03/26/2021 1022   GFRNONAA >60 03/26/2021 1022   GFRAA >60 02/10/2019 1003   Lab Results  Component Value Date   WBC 5.0 03/26/2021   NEUTROABS 2.8 03/26/2021   HGB 13.9 03/26/2021   HCT 42.9 03/26/2021   MCV 98.6 03/26/2021   PLT 191 03/26/2021    Imaging:  MR BRAIN W WO CONTRAST  Result Date: 03/27/2021 CLINICAL DATA:  Lung cancer with brain metastases, assess treatment response EXAM: MRI HEAD WITHOUT AND WITH CONTRAST TECHNIQUE: Multiplanar, multiecho pulse sequences of the brain and surrounding structures were obtained without and with intravenous contrast. CONTRAST:  57m GADAVIST GADOBUTROL 1 MMOL/ML IV SOLN COMPARISON:  01/31/2021, 06/06/2020, 03/17/2020 FINDINGS: Brain: No restricted diffusion to suggest acute infarct, subacute infarct, or nonenhancing metastatic lesion. No new areas of abnormal enhancement or enlarging lesions. Previously noted hemorrhagic metastatic foci are either no longer seen or are represented by small areas of intrinsic T1 hyperintense signal surrounding a focus of hemosiderin deposition (for example series 19, images 37, 69, 112). No acute hemorrhage, cerebral edema, mass effect, or midline shift. No hydrocephalus or extra-axial collection. T2 hyperintense signal in the periventricular white matter, likely the sequela of  chronic small vessel ischemic disease and/or radiation treatment. Vascular: Normal flow voids. Skull and upper cervical spine: Normal marrow signal. Sinuses/Orbits: No acute finding. Status post bilateral lens replacements. Other: Trace fluid in bilateral mastoid air cells. IMPRESSION: No acute intracranial process or evidence of new metastatic disease in the brain. Previously noted metastatic lesions are either no longer seen or are unchanged foci of hemosiderin deposition. Electronically Signed   By: AMerilyn BabaM.D.   On: 03/27/2021 12:19    [place]ennvilleClinician Interpretation: I have personally reviewed the radiological images as listed.  My interpretation, in the context of the patient's clinical presentation, is stable disease   Assessment/Plan Brain metastases ([place] Presents with clinical syndrome consistent with focal seizure and post-ictal encephalopathy, now resolved.  Stroke does not fit syndrome as well, and MRI obtained with weeks did not demonstrate DWI signal abnormality.  Etiology of seizures is burden of treated supratentorial metastases, provocation was excess stress burden the day of the event.    We counseled him and his wife on epilepsy safety including driving restrictions in [place].  We reviewed protocols for emergency management of epilepsy.   We will defer EEG due to clarity regarding source of seizures.    Recommended initiating anti-epileptic drug; will start with Keppra 54m BID.  Reviewed side effects including fatigue, irritability.  We spent twenty additional minutes teaching regarding the natural history, biology, and historical experience in the treatment of neurologic complications of cancer.   We appreciate the opportunity to participate in the care of [place].   We ask that [place] return to clinic in 4 months following next brain MRI, or sooner as needed.  All questions were answered. The patient knows to call  the clinic with any problems, questions or concerns. No barriers to learning were detected.  The total time spent in the encounter was 40 minutes and more than 50% was on counseling and review of test results   ZVentura Sellers MD Medical Director of Neuro-Oncology [facility]St. [facility] Centerat [place]ell[place]01/13/23 8:59 AM

## 2021-04-15 ENCOUNTER — Encounter: Payer: Self-pay | Admitting: Internal Medicine

## 2021-04-15 ENCOUNTER — Ambulatory Visit
Admission: RE | Admit: 2021-04-15 | Discharge: 2021-04-15 | Disposition: A | Discharge status: Home / Self Care | Payer: PPO | Source: Ambulatory Visit | Attending: Internal Medicine | Admitting: Internal Medicine

## 2021-04-15 ENCOUNTER — Telehealth: Payer: Self-pay | Admitting: *Deleted

## 2021-04-15 DIAGNOSIS — C3411 Malignant neoplasm of upper lobe, right bronchus or lung: Secondary | ICD-10-CM | POA: Diagnosis not present

## 2021-04-15 DIAGNOSIS — C7951 Secondary malignant neoplasm of bone: Secondary | ICD-10-CM | POA: Diagnosis not present

## 2021-04-15 IMAGING — CT CT CHEST-ABD-PELV W/ CM
2 of 5 series · 12 of 36 positions shown, 14 images · IV contrast (agent unspecified)
Comparison: Multiple priors including most recent CT [DATE] and PET-CT [DATE]

CLINICAL DATA: History of non-small cell lung cancer. Assess
treatment response.

EXAM:
CT CHEST, ABDOMEN, AND PELVIS WITH CONTRAST
TECHNIQUE: Multidetector CT imaging of the chest, abdomen and pelvis was
performed following the standard protocol during bolus
administration of intravenous contrast.

[Series 2: cap with · axial · 0.82mm/px · z∈[-1094,-499]mm · 9 of 149 slices shown, 11 images]
[im 15/149  mediastinal]
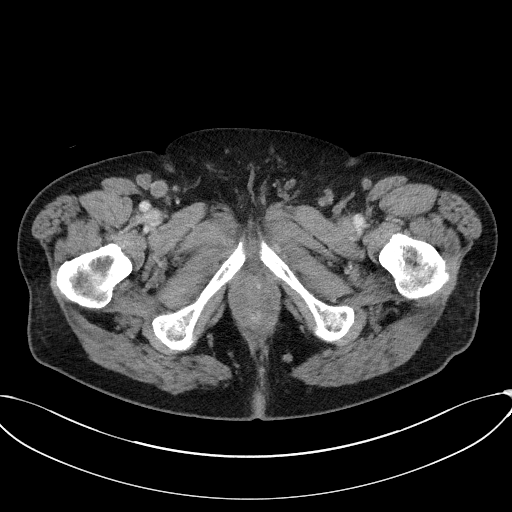
[im 15/149  bone]
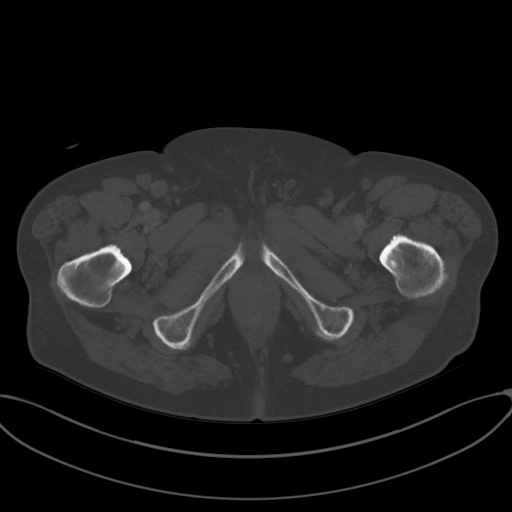
[im 30/149  mediastinal]
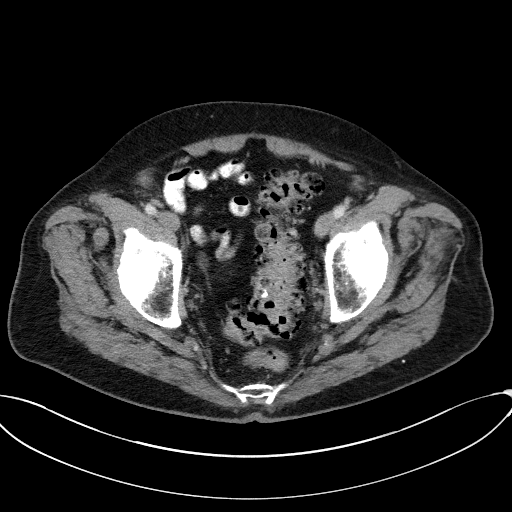
[im 45/149  mediastinal]
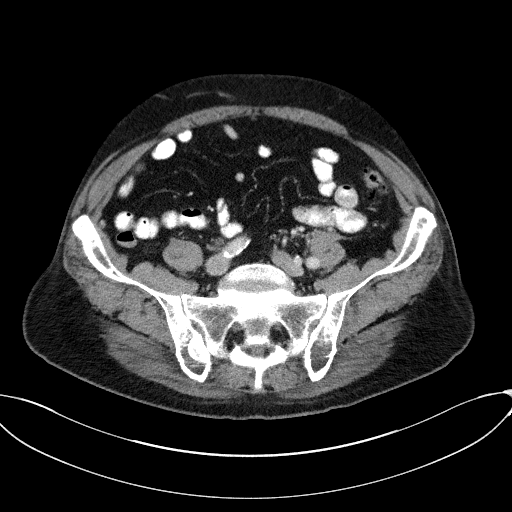
[im 60/149  mediastinal]
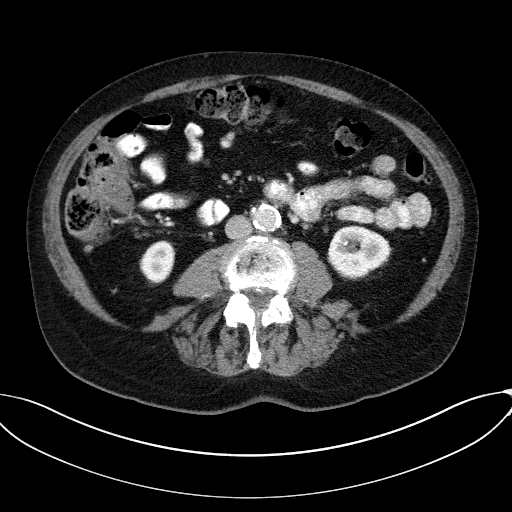
[im 75/149  mediastinal]
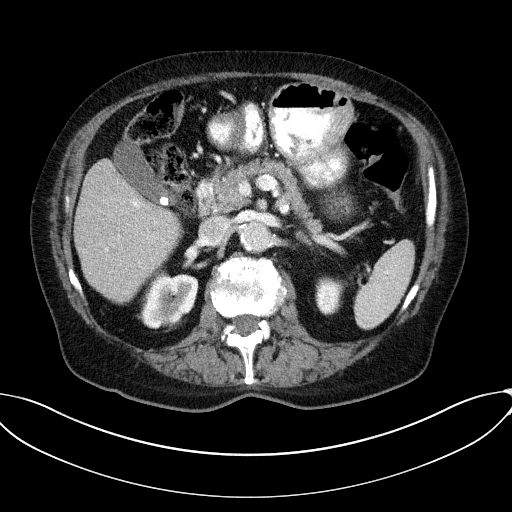
[im 89/149  mediastinal]
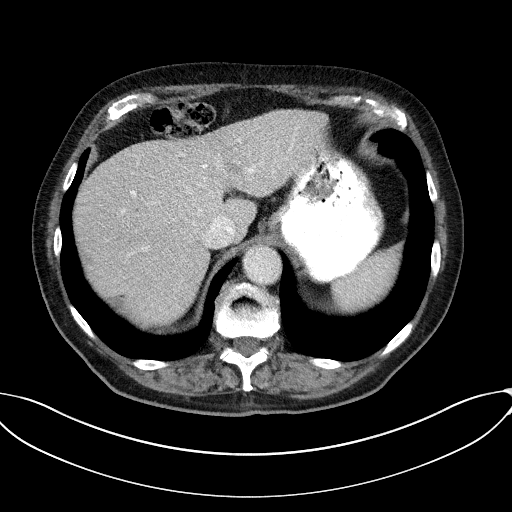
[im 104/149  mediastinal]
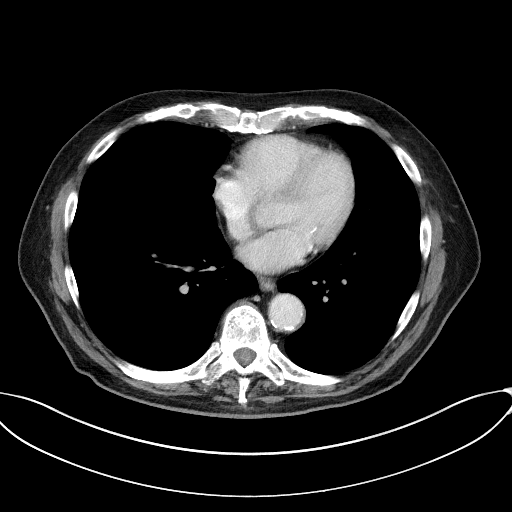
[im 119/149  mediastinal]
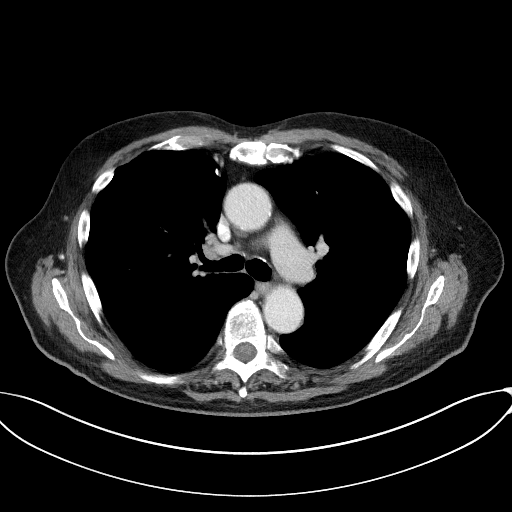
[im 134/149  mediastinal]
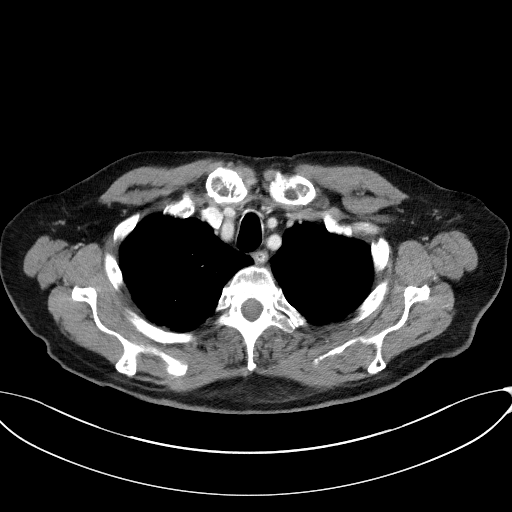
[im 134/149  bone]
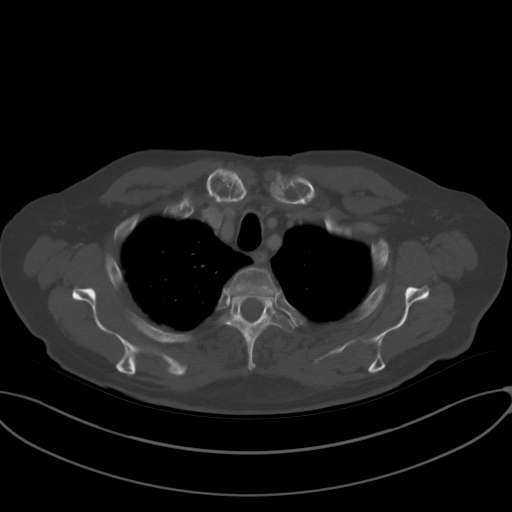

[Series 5: coronals · coronal · 0.86mm/px · 3 of 146 slices shown]
[im 30/146  mediastinal]
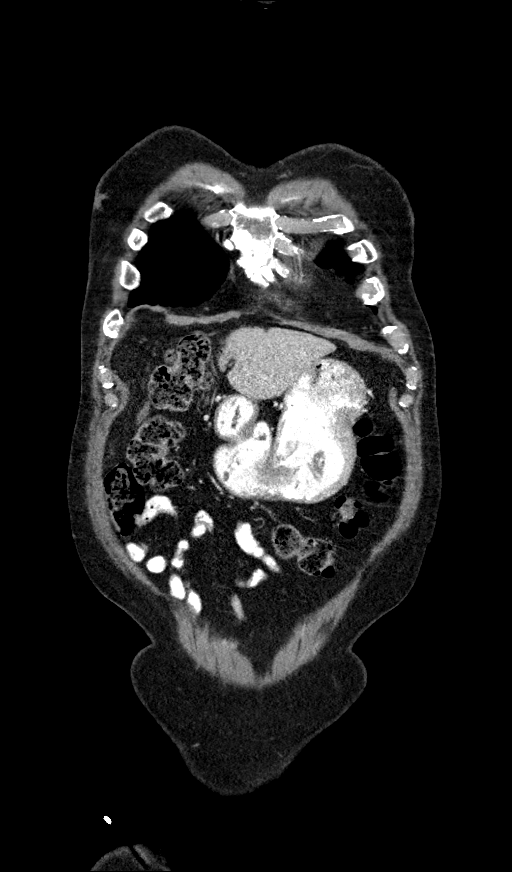
[im 59/146  mediastinal]
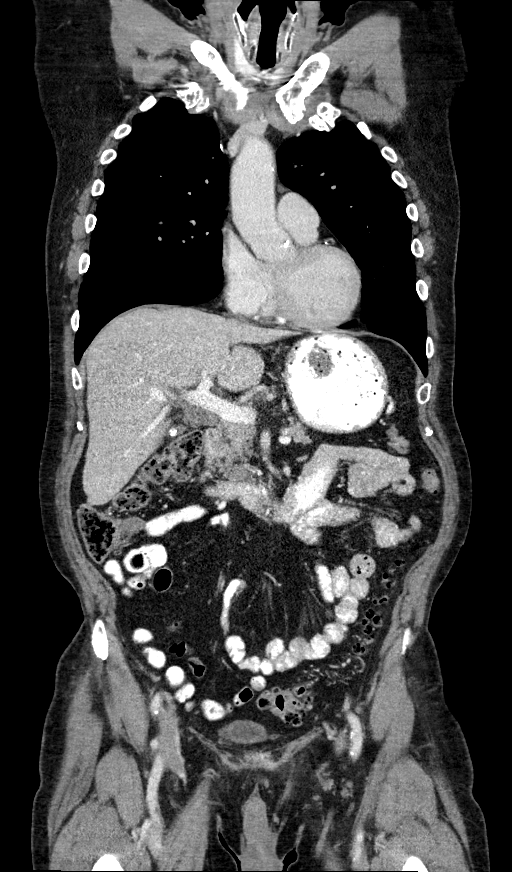
[im 88/146  mediastinal]
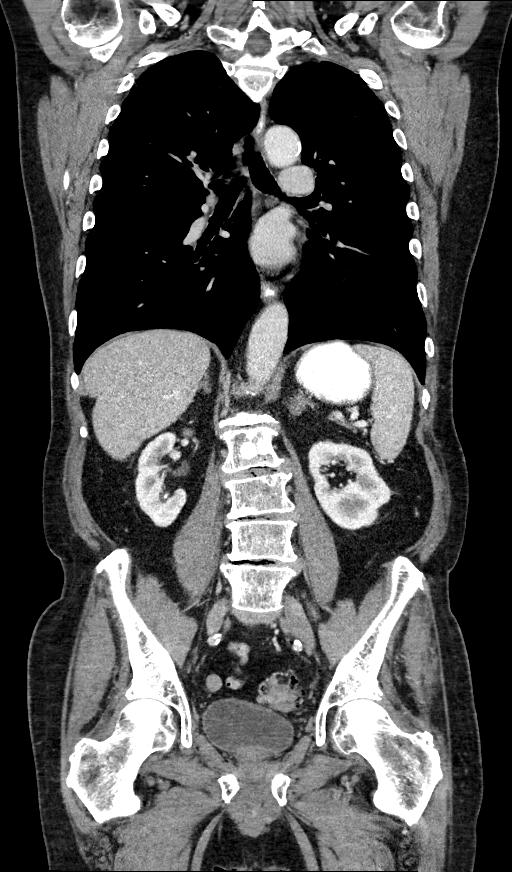

[12 of 36 positions shown; findings below may reference images not displayed]

RADIATION DOSE REDUCTION: This exam was performed according to the
departmental dose-optimization program which includes automated
exposure control, adjustment of the mA and/or kV according to
patient size and/or use of iterative reconstruction technique.

CONTRAST:  100mL OMNIPAQUE IOHEXOL 300 MG/ML  SOLN
FINDINGS: CT CHEST FINDINGS

Cardiovascular: Aortic atherosclerosis without thoracic aortic
aneurysm. No central pulmonary embolus on this nondedicated study.
Normal size heart. No significant pericardial effusion/thickening.
Coronary artery calcifications.

Mediastinum/Nodes: Decreased size of the right hilar lymph node
which was non hypermetabolic on PET-CT [DATE] now measuring
11 mm in short axis on image 32/2 previously 13 mm. No new or
enlarging suspicious mediastinal, hilar axillary lymph nodes. No
discrete thyroid nodule. Retained versus refluxed contrast in the
esophagus.

Lungs/Pleura: Several small right pulmonary nodules are again
visualized some of which are decreased in size in comparison to
prior. Previously index nodules are as follows:

-Anterior right middle lobe pulmonary nodule now measures 3 mm on
image 97/4 previously 5 mm.

-Interval resolution of the other previously indexed right middle
lobe pulmonary nodule.

-a nonindexed pulmonary nodule in the lateral right middle lobe
measures 3 mm on image 100/4 is unchanged.

No new suspicious pulmonary nodules or masses.

Mild apical predominant paraseptal emphysematous change. Similar
mild biapical pleuroparenchymal scarring.

Musculoskeletal: Multiple round sclerotic osseous spinal lesions are
unchanged with indexed T12 lesion measuring 15 mm on image 158/4
previously 16 mm. No new suspicious lytic or blastic lesions of
bone.

CT ABDOMEN PELVIS FINDINGS

Hepatobiliary: Stable subcentimeter hypodense hepatic lesions, the
largest of which is in the anterior right lobe of the liver and is
fluid attenuation. Previously seen metastatic lesions are not
demonstrated. Cholelithiasis without evidence of acute
cholecystitis.

Pancreas: No pancreatic ductal dilation or evidence of acute
inflammation.

Spleen: Normal in size without focal abnormality.

Adrenals/Urinary Tract: Similar nodular thickening of the left
adrenal gland. Right adrenal glands unremarkable. No hydronephrosis.
Punctate nonobstructive right lower pole renal stone. Urinary
bladder is unremarkable for degree of distension.

Stomach/Bowel: Radiopaque enteric contrast material traverses distal
loops of small bowel. Stomach is distended without wall thickening.
No pathologic dilation of small or large bowel. The appendix and
terminal ileum appear normal. Colonic diverticulosis without
findings of acute diverticulitis.

Vascular/Lymphatic: New enlarged low-density porta hepatis lymph
nodes measure up to 2 cm in short axis on image 69/2 with an
additional index node measuring 15 mm in short axis on image 72/2.

Aortic and branch vessel atherosclerosis with similar aneurysmal
dilation of the infrarenal abdominal aorta measuring 3.1 cm in
maximum axial dimension.

Reproductive: Prostate is unremarkable.

Other: No significant abdominopelvic free fluid.

Musculoskeletal: Similar appearance of the multiple round sclerotic
osseous metastatic lesions involving the spine and pelvic girdle. No
new suspicious lytic or blastic lesion of bone identified.
IMPRESSION: 1. New enlarged low-density porta hepatis lymph nodes which are
nonspecific but suspicious for nodal metastatic disease. Consider
further evaluation with PET-CT.
2. Similar appearance of the diffuse sclerotic osseous metastatic
lesions throughout the axial and appendicular skeleton. No new
suspicious osseous lesions identified
3. Decreased size of the right hilar lymph node which was non
hypermetabolic on PET-CT [DATE]. No new or enlarging
suspicious thoracic lymph nodes.
4. Several small right pulmonary nodules are again visualized some
of which are decreased in size in comparison to prior, again favored
benign. Continued attention on follow-up imaging suggested.
5. Cholelithiasis without evidence of acute cholecystitis.
6. Unchanged 3.1 cm infrarenal abdominal aortic aneurysm. In this
patient continued surveillance on oncologic imaging suggested.
Otherwise, recommend follow-up every 3 years. Reference: [HOSPITAL] [LX];[DATE].
7. Aortic Atherosclerosis ([LX]-[LX]).

These results will be called to the ordering clinician or
representative by the Radiologist Assistant, and communication
documented in the PACS or [REDACTED].

## 2021-04-15 MED ORDER — IOHEXOL 300 MG/ML  SOLN
100.0000 mL | Freq: Once | INTRAMUSCULAR | Status: AC | PRN
  Administered 2021-04-15: 100 mL via INTRAVENOUS

## 2021-04-15 NOTE — Progress Notes (Signed)
I reviewed the results of the CT scan- new lymphadenopathy in the abdomen; Otherwise stable disease. Recommend a PET scan At next visit. Will discuss further. Continue current treatment.

## 2021-04-15 NOTE — Telephone Encounter (Signed)
Called report  IMPRESSION: 1. New enlarged low-density porta hepatis lymph nodes which are nonspecific but suspicious for nodal metastatic disease. Consider further evaluation with PET-CT. 2. Similar appearance of the diffuse sclerotic osseous metastatic lesions throughout the axial and appendicular skeleton. No new suspicious osseous lesions identified 3. Decreased size of the right hilar lymph node which was non hypermetabolic on PET-CT June 25, 2020. No new or enlarging suspicious thoracic lymph nodes. 4. Several small right pulmonary nodules are again visualized some of which are decreased in size in comparison to prior, again favored benign. Continued attention on follow-up imaging suggested. 5. Cholelithiasis without evidence of acute cholecystitis. 6. Unchanged 3.1 cm infrarenal abdominal aortic aneurysm. In this patient continued surveillance on oncologic imaging suggested. Otherwise, recommend follow-up every 3 years. Reference: J Am Coll Radiol 7011;00:349-611. 7. Aortic Atherosclerosis (ICD10-I70.0).   These results will be called to the ordering clinician or representative by the Radiologist Assistant, and communication documented in the PACS or [employer].     Electronically Signed   By: Dahlia Bailiff M.D.   On: 04/15/2021 15:26

## 2021-04-17 ENCOUNTER — Inpatient Hospital Stay: Payer: PPO | Admitting: Internal Medicine

## 2021-04-17 ENCOUNTER — Inpatient Hospital Stay: Payer: PPO

## 2021-04-17 ENCOUNTER — Other Ambulatory Visit: Payer: Self-pay

## 2021-04-17 ENCOUNTER — Encounter: Payer: Self-pay | Admitting: Internal Medicine

## 2021-04-17 VITALS — BP 134/74 | HR 74 | Temp 97.2°F | Ht 71.0 in | Wt 200.6 lb

## 2021-04-17 DIAGNOSIS — C3411 Malignant neoplasm of upper lobe, right bronchus or lung: Secondary | ICD-10-CM

## 2021-04-17 DIAGNOSIS — C7931 Secondary malignant neoplasm of brain: Secondary | ICD-10-CM

## 2021-04-17 LAB — CBC WITH DIFFERENTIAL/PLATELET
Abs Immature Granulocytes: 0.01 10*3/uL (ref 0.00–0.07)
Basophils Absolute: 0.1 10*3/uL (ref 0.0–0.1)
Basophils Relative: 1 %
Eosinophils Absolute: 0.3 10*3/uL (ref 0.0–0.5)
Eosinophils Relative: 5 %
HCT: 41.8 % (ref 39.0–52.0)
Hemoglobin: 13.8 g/dL (ref 13.0–17.0)
Immature Granulocytes: 0 %
Lymphocytes Relative: 24 %
Lymphs Abs: 1.4 10*3/uL (ref 0.7–4.0)
MCH: 32.2 pg (ref 26.0–34.0)
MCHC: 33 g/dL (ref 30.0–36.0)
MCV: 97.4 fL (ref 80.0–100.0)
Monocytes Absolute: 0.8 10*3/uL (ref 0.1–1.0)
Monocytes Relative: 13 %
Neutro Abs: 3.3 10*3/uL (ref 1.7–7.7)
Neutrophils Relative %: 57 %
Platelets: 160 10*3/uL (ref 150–400)
RBC: 4.29 MIL/uL (ref 4.22–5.81)
RDW: 14.2 % (ref 11.5–15.5)
WBC: 5.9 10*3/uL (ref 4.0–10.5)
nRBC: 0 % (ref 0.0–0.2)

## 2021-04-17 LAB — COMPREHENSIVE METABOLIC PANEL
ALT: 98 U/L — ABNORMAL HIGH (ref 0–44)
AST: 56 U/L — ABNORMAL HIGH (ref 15–41)
Albumin: 3.7 g/dL (ref 3.5–5.0)
Alkaline Phosphatase: 41 U/L (ref 38–126)
Anion gap: 5 (ref 5–15)
BUN: 15 mg/dL (ref 8–23)
CO2: 28 mmol/L (ref 22–32)
Calcium: 8.6 mg/dL — ABNORMAL LOW (ref 8.9–10.3)
Chloride: 104 mmol/L (ref 98–111)
Creatinine, Ser: 0.92 mg/dL (ref 0.61–1.24)
GFR, Estimated: >60 mL/min (ref >60)
Glucose, Bld: 92 mg/dL (ref 70–99)
Potassium: 3.9 mmol/L (ref 3.5–5.1)
Sodium: 137 mmol/L (ref 135–145)
Total Bilirubin: 0.4 mg/dL (ref 0.3–1.2)
Total Protein: 6.9 g/dL (ref 6.5–8.1)

## 2021-04-17 NOTE — Progress Notes (Signed)
[place] NOTE  Patient Care Team: [name], MD as PCP - General (Family Medicine) [name], MD as Consulting Physician (Family Medicine) [name], [name], MD (General Surgery) Thornton Park, DO as Consulting Physician (Radiation Oncology)  CHIEF COMPLAINTS/PURPOSE OF CONSULTATION: Lung cancer  #  Oncology History Overview Note  # OCT 2018- STAGE I- A. 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  - PARENCHYMAL MARGIN APPEARS CLEAR; TUMOR WAS 1 CM FROM MARGIN BEFORE REMOVAL OF STAPLE LINE.; NO adjuvant therapy.   # DEC 2021-- METASTATIC CARCINOMA, COMPATIBLE WITH PULMONARY ADENOCARCINOMA [left shoulder Core Biopsy];  NOV 24th PET-right hilar and Hypermetabolic mediastinal lymphadenopathy; small pulmonary nodules; 2 metastatic lesions in the liver/  pancreatic head/left adrenal gland;  muscle and skeletal metastasis.  Subcutaneous nodule-positive for metastatic adeno lung.   # DEC 2021-Brain MRI multiple subcentimeter enhancing brain mets no significant edema - NO SYMPTOMS;s/p RT eval [Dr.Blackburn/Crystal-s/p WBRT- JAN 11th, 2022]  # JAN 14th, 2021-START DABRFENIB +MEKINIST [name] 2022- MUGA scan-EF-58%]; STOPPED MID OCT 2022- sec to acute pancreatitis  # NOV 7th, 2022- Enco+Bini.   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  Short course of Eliquis.smoking-quit 2008.  ---------------------------------------    DIAGNOSIS: LUNG CA  STAGE:  IV      ;GOALS:palliation     Primary cancer of right upper lobe of lung ([place])  04/09/2020 Cancer Staging   Staging form: Lung, AJCC 8th Edition - Clinical: Stage IVB (cN1, cM1c) - Signed by Cammie Sickle, MD on 04/09/2020       HISTORY OF PRESENTING ILLNESS: Ambulating by independently.  Accompanied by his  wife.  Daughters on the phone with  Curtis Andrade. 76 y.o.  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  Patient was evaluated by neuro-oncology-started on Keppra for seizures.  Denies any further episodes of seizures.  Denies any headaches.  Denies any nausea vomiting.  Mild fatigue/grogginess after Keppra.  Denies any abdominal pain.  No diarrhea no skin rash.  Review of Systems  Constitutional:  Positive for malaise/fatigue. Negative for chills, diaphoresis, fever and weight loss.  HENT:  Negative for nosebleeds and sore throat.   Eyes:  Negative for double vision.  Respiratory:  Negative for cough, hemoptysis, sputum production, shortness of breath and wheezing.   Cardiovascular:  Negative for chest pain, palpitations, orthopnea and leg swelling.  Gastrointestinal:  Negative for abdominal pain, blood in stool, constipation, diarrhea, heartburn, melena, nausea and vomiting.  Genitourinary:  Negative for dysuria, frequency and urgency.  Musculoskeletal:  Positive for back pain and joint pain.  Skin: Negative.  Negative for itching and rash.  Neurological:  Positive for weakness. Negative for dizziness, tingling, focal weakness and headaches.  Endo/Heme/Allergies:  Does not bruise/bleed easily.  Psychiatric/Behavioral:  Positive for memory loss. Negative for depression. The patient is not nervous/anxious and does not have insomnia.     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 Baruch Gouty and Dr. Nadeen Landau   Phlebitis 1998   Primary cancer of right upper lobe of lung ([place])  01/14/2017   Dr. Genevive Bi performed a wedge resection of RUL.    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/27/2016    SURGICAL HISTORY: Past Surgical History:  Procedure Laterality Date   COLONOSCOPY  2013   Dr Vira Agar   COLONOSCOPY WITH PROPOFOL N/A 12/23/2016   Procedure: COLONOSCOPY WITH PROPOFOL;  Surgeon: Robert Bellow, MD;  Location: [facility] ENDOSCOPY;  Service: Endoscopy;  Laterality: N/A;   EYE SURGERY     HERNIA REPAIR Right 1990's   TENDON REPAIR Left 03/14/2019   Procedure: TENDON REPAIR AND GRAFT  TRANSFER EXTENSOR INDICIS PROPRIUS LEFT THUMB;  Surgeon: [name], MD;  Location: [facility];  Service: Orthopedics;  Laterality: Left;  AXILLARY BLOCK   THORACOTOMY Right 01/14/2017   Procedure: RIGHT THORACOSCOPYWITH WIDE WEDGE RESECTION,  PREOP BROCHOSCOPY;  Surgeon: [name], MD;  Location: [facility] ORS;  Service: General;  Laterality: Right;   THROAT SURGERY  2010    SOCIAL HISTORY: Social History   Socioeconomic History   Marital status: Married    Spouse name: Not on file   Number of children: Not on file   Years of education: Not on file   Highest education level: Not on file  Occupational History   Not on file  Tobacco Use   Smoking status: Former    Years: 50.00    Types: Cigarettes    Quit date: 03/31/2007    Years since quitting: 14.0   Smokeless tobacco: Never  Vaping Use   Vaping Use: Never used  Substance and Sexual Activity   Alcohol use: No   Drug use: No   Sexual activity: Yes    Birth control/protection: None    Comment: Married  Other Topics Concern   Not on file  Social History Narrative   Not on file   Social Determinants of Health   Financial Resource Strain: Not on file  Food Insecurity: Not on file  Transportation Needs: Not on file  Physical Activity: Not on file  Stress: Not on file  Social Connections: Not on file  Intimate Partner Violence: Not on file    FAMILY HISTORY: Family History  Problem Relation Age of Onset    Alzheimer's disease Mother    Alzheimer's disease Father    Stroke Father    Colon cancer Neg Hx     ALLERGIES:  has No Known Allergies.  MEDICATIONS:  Current Outpatient Medications  Medication Sig Dispense Refill   Ascorbic Acid (VITAMIN C) 1000 MG tablet Take 1,000 mg by mouth daily.     aspirin EC 81 MG tablet Take 81 mg by mouth daily.     binimetinib (MEKTOVI) 15 MG tablet Take 3 tablets (45 mg total) by mouth 2 (two) times daily. 180 tablet 2   Calcium Carbonate-Vit D-Min (CALCIUM 1200 PO) Take 1 tablet by mouth daily.     encorafenib (BRAFTOVI) 75 MG capsule Take 450 mg by mouth daily.     folic acid (FOLVITE) 741 MCG tablet Take 800 mcg by mouth daily.     levETIRAcetam (KEPPRA) 500 MG tablet Take 1 tablet (500 mg total) by mouth 2 (two) times daily. 60 tablet 3   Multiple Vitamin (MULTIVITAMIN WITH MINERALS) TABS tablet Take 1 tablet by mouth daily. Senior Multivitamin.     Multiple Vitamins-Minerals (EMERGEN-C IMMUNE PO) Take 1 tablet by mouth 2 (two) times daily.  Omega-3 1000 MG CAPS Take 1 tablet by mouth 1 day or 1 dose.     omeprazole (PRILOSEC) 40 MG capsule Take by mouth.     saw palmetto 80 MG capsule Take 80 mg by mouth daily.     No current facility-administered medications for this visit.      Marland Kitchen  PHYSICAL EXAMINATION: ECOG PERFORMANCE STATUS: 0 - Asymptomatic  Vitals:   04/17/21 1059  BP: 134/74  Pulse: 74  Temp: (!) 97.2 F (36.2 C)  SpO2: 98%    Filed Weights   04/17/21 1059  Weight: 200 lb 9.6 oz (91 kg)     Physical Exam HENT:     Head: Normocephalic and atraumatic.     Mouth/Throat:     Pharynx: No oropharyngeal exudate.  Eyes:     Pupils: Pupils are equal, round, and reactive to light.  Cardiovascular:     Rate and Rhythm: Normal rate and regular rhythm.  Pulmonary:     Effort: Pulmonary effort is normal. No respiratory distress.     Breath sounds: Normal breath sounds. No wheezing.  Abdominal:     General: Bowel sounds are  normal. There is no distension.     Palpations: Abdomen is soft. There is no mass.     Tenderness: There is no abdominal tenderness. There is no guarding or rebound.  Musculoskeletal:        General: No tenderness. Normal range of motion.     Cervical back: Normal range of motion and neck supple.  Skin:    General: Skin is warm.  Neurological:     Mental Status: He is alert and oriented to person, place, and time.  Psychiatric:        Mood and Affect: Affect normal.  ;  LABORATORY DATA:  I have reviewed the data as listed Lab Results  Component Value Date   WBC 5.9 04/17/2021   HGB 13.8 04/17/2021   HCT 41.8 04/17/2021   MCV 97.4 04/17/2021   PLT 160 04/17/2021   Recent Labs    02/19/21 1052 03/26/21 1022 04/17/21 1046  NA 137 139 137  K 4.6 4.3 3.9  CL 101 101 104  CO2 '28 30 28  ' GLUCOSE 128* 108* 92  BUN '16 14 15  ' CREATININE 1.13 1.06 0.92  CALCIUM 9.1 9.3 8.6*  GFRNONAA >60 >60 >60  PROT 7.3 7.0 6.9  ALBUMIN 4.2 3.8 3.7  AST 34 28 56*  ALT 29 22 98*  ALKPHOS 40 41 41  BILITOT 0.4 0.5 0.4    RADIOGRAPHIC STUDIES: I have personally reviewed the radiological images as listed and agreed with the findings in the report. MR BRAIN W WO CONTRAST  Result Date: 03/27/2021 CLINICAL DATA:  Lung cancer with brain metastases, assess treatment response EXAM: MRI HEAD WITHOUT AND WITH CONTRAST TECHNIQUE: Multiplanar, multiecho pulse sequences of the brain and surrounding structures were obtained without and with intravenous contrast. CONTRAST:  47m GADAVIST GADOBUTROL 1 MMOL/ML IV SOLN COMPARISON:  01/31/2021, 06/06/2020, 03/17/2020 FINDINGS: Brain: No restricted diffusion to suggest acute infarct, subacute infarct, or nonenhancing metastatic lesion. No new areas of abnormal enhancement or enlarging lesions. Previously noted hemorrhagic metastatic foci are either no longer seen or are represented by small areas of intrinsic T1 hyperintense signal surrounding a focus of  hemosiderin deposition (for example series 19, images 37, 69, 112). No acute hemorrhage, cerebral edema, mass effect, or midline shift. No hydrocephalus or extra-axial collection. T2 hyperintense signal in the periventricular white matter, likely the  sequela of chronic small vessel ischemic disease and/or radiation treatment. Vascular: Normal flow voids. Skull and upper cervical spine: Normal marrow signal. Sinuses/Orbits: No acute finding. Status post bilateral lens replacements. Other: Trace fluid in bilateral mastoid air cells. IMPRESSION: No acute intracranial process or evidence of new metastatic disease in the brain. Previously noted metastatic lesions are either no longer seen or are unchanged foci of hemosiderin deposition. Electronically Signed   By: Merilyn Baba M.D.   On: 03/27/2021 12:19   CT CHEST ABDOMEN PELVIS W CONTRAST  Result Date: 04/15/2021 CLINICAL DATA:  History of non-small cell lung cancer. Assess treatment response. EXAM: CT CHEST, ABDOMEN, AND PELVIS WITH CONTRAST TECHNIQUE: Multidetector CT imaging of the chest, abdomen and pelvis was performed following the standard protocol during bolus administration of intravenous contrast. RADIATION DOSE REDUCTION: This exam was performed according to the departmental dose-optimization program which includes automated exposure control, adjustment of the mA and/or kV according to patient size and/or use of iterative reconstruction technique. CONTRAST:  170m OMNIPAQUE IOHEXOL 300 MG/ML  SOLN COMPARISON:  Multiple priors including most recent CT November 08, 2020 and PET-CT June 25, 2020 FINDINGS: CT CHEST FINDINGS Cardiovascular: Aortic atherosclerosis without thoracic aortic aneurysm. No central pulmonary embolus on this nondedicated study. Normal size heart. No significant pericardial effusion/thickening. Coronary artery calcifications. Mediastinum/Nodes: Decreased size of the right hilar lymph node which was non hypermetabolic on PET-CT June 25, 2020 now measuring 11 mm in short axis on image 32/2 previously 13 mm. No new or enlarging suspicious mediastinal, hilar axillary lymph nodes. No discrete thyroid nodule. Retained versus refluxed contrast in the esophagus. Lungs/Pleura: Several small right pulmonary nodules are again visualized some of which are decreased in size in comparison to prior. Previously index nodules are as follows: -Anterior right middle lobe pulmonary nodule now measures 3 mm on image 97/4 previously 5 mm. -Interval resolution of the other previously indexed right middle lobe pulmonary nodule. -a nonindexed pulmonary nodule in the lateral right middle lobe measures 3 mm on image 100/4 is unchanged. No new suspicious pulmonary nodules or masses. Mild apical predominant paraseptal emphysematous change. Similar mild biapical pleuroparenchymal scarring. Musculoskeletal: Multiple round sclerotic osseous spinal lesions are unchanged with indexed T12 lesion measuring 15 mm on image 158/4 previously 16 mm. No new suspicious lytic or blastic lesions of bone. CT ABDOMEN PELVIS FINDINGS Hepatobiliary: Stable subcentimeter hypodense hepatic lesions, the largest of which is in the anterior right lobe of the liver and is fluid attenuation. Previously seen metastatic lesions are not demonstrated. Cholelithiasis without evidence of acute cholecystitis. Pancreas: No pancreatic ductal dilation or evidence of acute inflammation. Spleen: Normal in size without focal abnormality. Adrenals/Urinary Tract: Similar nodular thickening of the left adrenal gland. Right adrenal glands unremarkable. No hydronephrosis. Punctate nonobstructive right lower pole renal stone. Urinary bladder is unremarkable for degree of distension. Stomach/Bowel: Radiopaque enteric contrast material traverses distal loops of small bowel. Stomach is distended without wall thickening. No pathologic dilation of small or large bowel. The appendix and terminal ileum appear normal.  Colonic diverticulosis without findings of acute diverticulitis. Vascular/Lymphatic: New enlarged low-density porta hepatis lymph nodes measure up to 2 cm in short axis on image 69/2 with an additional index node measuring 15 mm in short axis on image 72/2. Aortic and branch vessel atherosclerosis with similar aneurysmal dilation of the infrarenal abdominal aorta measuring 3.1 cm in maximum axial dimension. Reproductive: Prostate is unremarkable. Other: No significant abdominopelvic free fluid. Musculoskeletal: Similar appearance of the multiple round sclerotic osseous  metastatic lesions involving the spine and pelvic girdle. No new suspicious lytic or blastic lesion of bone identified. IMPRESSION: 1. New enlarged low-density porta hepatis lymph nodes which are nonspecific but suspicious for nodal metastatic disease. Consider further evaluation with PET-CT. 2. Similar appearance of the diffuse sclerotic osseous metastatic lesions throughout the axial and appendicular skeleton. No new suspicious osseous lesions identified 3. Decreased size of the right hilar lymph node which was non hypermetabolic on PET-CT June 25, 2020. No new or enlarging suspicious thoracic lymph nodes. 4. Several small right pulmonary nodules are again visualized some of which are decreased in size in comparison to prior, again favored benign. Continued attention on follow-up imaging suggested. 5. Cholelithiasis without evidence of acute cholecystitis. 6. Unchanged 3.1 cm infrarenal abdominal aortic aneurysm. In this patient continued surveillance on oncologic imaging suggested. Otherwise, recommend follow-up every 3 years. Reference: J Am Coll Radiol 1610;96:045-409. 7. Aortic Atherosclerosis (ICD10-I70.0). These results will be called to the ordering clinician or representative by the Radiologist Assistant, and communication documented in the PACS or [employer]. Electronically Signed   By: Dahlia Bailiff M.D.   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 Similar appearance of the diffuse sclerotic osseous metastatic lesions throughout the axial and appendicular skeleton. Decreased size of the right hilar lymph node which was non hypermetabolic;   #Question concern for progression of malignancy based on new/increasing size of the periportal lymph nodes vs inflammatory; however patient had episode of pancreatitis in SEP 2023.  Await PET scan for further evaluation.  #For now continue Enco+Bini-await PET scan above.  #Seizures-DEC 2022 [Dr.vaslow]-Keppra twice daily tolerating fairly well.  No further episodes.  # Brain metastases: s/p WBRT [dec 2021];NOV 2022-interval improvement in numerous hemorrhagic metastases in the brain, which have decreased in conspicuity. No new lesions identified. No acute intracranial process- however, see above.   # Bone lesions-mildly symptomatic; continue Zometa every 4-6 weeks [30 mins; tylenol]. with infusion premedicated with Tylenol at home.;  Plan to restart at next visit.   #Incidental findings on 17th, 2013- CT CAP- .Cholelithiasis without evidence of acute cholecystitis; 3.1 cm AAA; aortic atherosclerosis;on  Imaging  I reviewed/discussed/counseled the patient  # DISPOSITION: [mon/-wed appt] # PET ASAP # follow up 1-2 day after the PET scan-MD; labs- CMP; possible Zometa--Dr.B  # I reviewed the blood work- with the patient in detail; also reviewed the imaging independently [as summarized above]; and with the patient in detail.         All questions  were answered. The patient knows to call the clinic with any problems, questions or concerns.    Cammie Sickle, MD 04/17/2021 1:20 PM

## 2021-04-17 NOTE — Assessment & Plan Note (Addendum)
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 Similar appearance of the diffuse sclerotic osseous metastatic lesions throughout the axial and appendicular skeleton. Decreased size of the right hilar lymph node which was non hypermetabolic;   #Question concern for progression of malignancy based on new/increasing size of the periportal lymph nodes vs inflammatory; however patient had episode of pancreatitis in SEP 2023.  Await PET scan for further evaluation.  #For now continue Enco+Bini-await PET scan above.  #Seizures-DEC 2022 [Dr.vaslow]-Keppra twice daily tolerating fairly well.  No further episodes.  # Brain metastases: s/p WBRT [dec 2021];NOV 2022-interval improvement in numerous hemorrhagic metastases in the brain, which have decreased in conspicuity. No new lesions identified. No acute intracranial process- however, see above.   # Bone lesions-mildly symptomatic; continue Zometa every 4-6 weeks [30 mins; tylenol]. with infusion premedicated with Tylenol at home.;  Plan to restart at next visit.   #Incidental findings on 17th, 2013- CT CAP- .Cholelithiasis without evidence of acute cholecystitis; 3.1 cm AAA; aortic atherosclerosis;on  Imaging  I reviewed/discussed/counseled the patient  # DISPOSITION: [mon/-wed appt] # PET ASAP # follow up 1-2 day after the PET scan-MD; labs- CMP; possible Zometa--Dr.B  # I reviewed the blood work- with the patient in detail; also reviewed the imaging independently [as summarized above]; and with the patient in detail.

## 2021-04-21 ENCOUNTER — Telehealth: Payer: Self-pay | Admitting: Pharmacy Technician

## 2021-04-21 NOTE — Telephone Encounter (Signed)
[name] Encounter  Received notification from [place] that patient has been successfully re-enrolled into their program to receive Mektovi/Braftovi from the manufacturer at $0 out of pocket until 03/29/22.    I called and spoke with patient.  He knows we will have to re-apply.   Specialty Pharmacy that will dispense medication is [facility].  Patient knows to call the office with questions or concerns.   Oral Oncology Clinic will continue to follow.  [place] [facility] [place] Phone [phone] Fax [phone] 04/21/2021 10:19 AM

## 2021-04-21 NOTE — Telephone Encounter (Signed)
[name] Encounter  Patient dropped off his denial letter for Medicare Extra Help.  Denial letter was faxed to [place] Patient Assistance at [phone].  Curtis Andrade Phone [phone] Fax [phone] 04/21/2021 9:47 AM

## 2021-04-29 ENCOUNTER — Other Ambulatory Visit: Payer: Self-pay | Admitting: *Deleted

## 2021-04-29 DIAGNOSIS — C3411 Malignant neoplasm of upper lobe, right bronchus or lung: Secondary | ICD-10-CM

## 2021-04-30 ENCOUNTER — Other Ambulatory Visit: Payer: Self-pay

## 2021-04-30 ENCOUNTER — Encounter
Admission: RE | Admit: 2021-04-30 | Discharge: 2021-04-30 | Disposition: A | Discharge status: Home / Self Care | Payer: PPO | Source: Ambulatory Visit | Attending: Internal Medicine | Admitting: Internal Medicine

## 2021-04-30 DIAGNOSIS — C3411 Malignant neoplasm of upper lobe, right bronchus or lung: Secondary | ICD-10-CM | POA: Insufficient documentation

## 2021-04-30 DIAGNOSIS — C3490 Malignant neoplasm of unspecified part of unspecified bronchus or lung: Secondary | ICD-10-CM | POA: Diagnosis not present

## 2021-04-30 LAB — GLUCOSE, CAPILLARY: Glucose-Capillary: 81 mg/dL (ref 70–99)

## 2021-04-30 IMAGING — CT NM PET TUM IMG RESTAG (PS) SKULL BASE T - THIGH
9 series · 24 of 25 positions shown · non-contrast
Comparison: Chest CT of [DATE].  PET [DATE]

CLINICAL DATA: Subsequent treatment strategy for non-small-cell
lung cancer. Recurrence. On chemotherapy and immunotherapy. Porta
hepatis adenopathy on recent CT.

EXAM:
NUCLEAR MEDICINE PET SKULL BASE TO THIGH
TECHNIQUE: 10.7 mCi F-18 FDG was injected intravenously. Full-ring PET imaging
was performed from the skull base to thigh after the radiotracer. CT
data was obtained and used for attenuation correction and anatomic
localization.
Fasting blood glucose: 81 mg/dl

[Series 3: ct wb 5.0 b30f · axial · 5.0mm · 0.98mm/px · z∈[-1438,-454]mm · 3 of 329 slices shown]
[im 1/329]
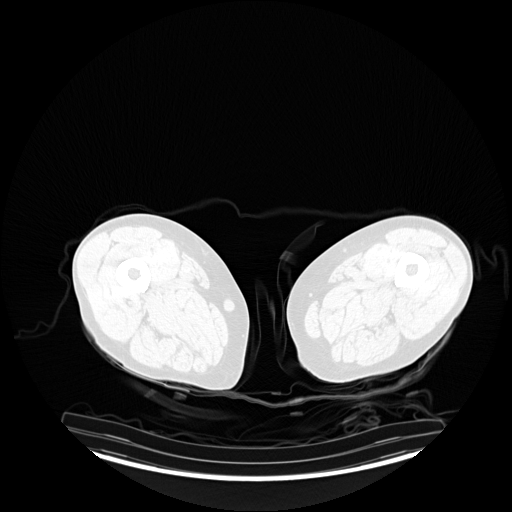
[im 165/329]
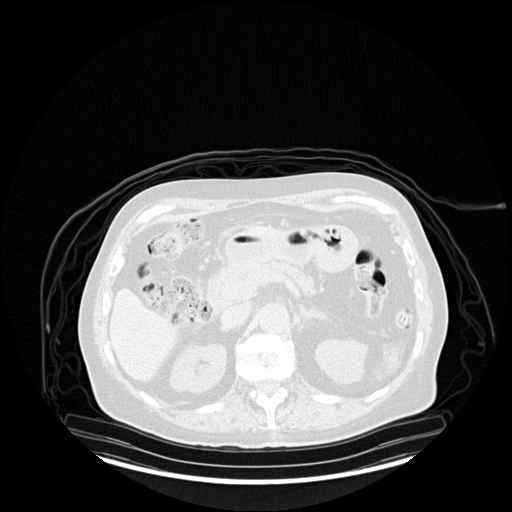
[im 329/329  brain]
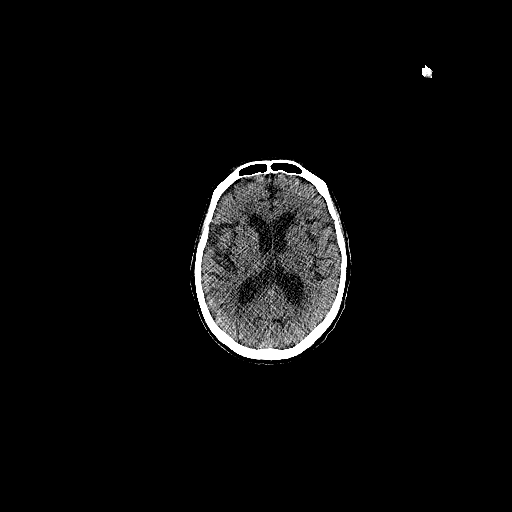

[Series 5: pet wb uncorrected (nac) · axial · 5.0mm · 4.07mm/px · z∈[-1438,-454]mm · 3 of 329 slices shown]
[im 1/329]
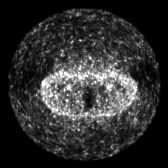
[im 165/329]
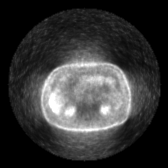
[im 329/329]
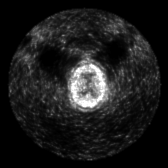

[Series 6: pet wb (ac) · axial · 5.0mm · 3.13mm/px · z∈[-1438,-454]mm · 4 of 329 slices shown]
[im 1/329]
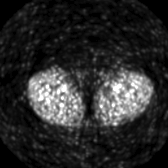
[im 110/329]
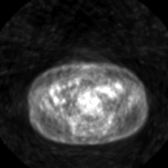
[im 219/329]
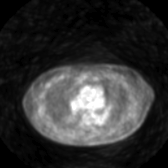
[im 329/329]
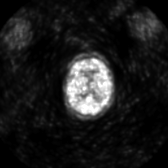

[Series 603: fused axial · 3 of 329 slices shown]
[im 1/329]
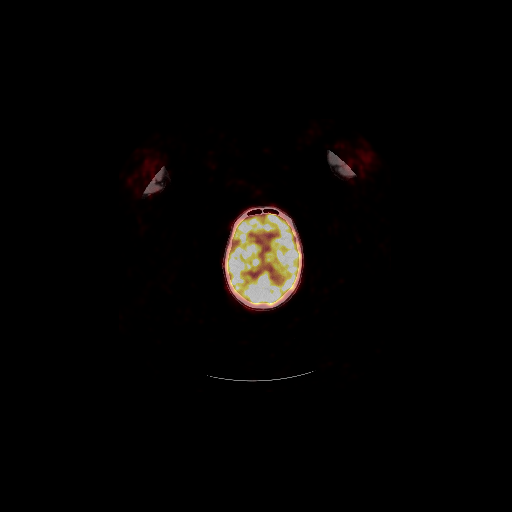
[im 110/329]
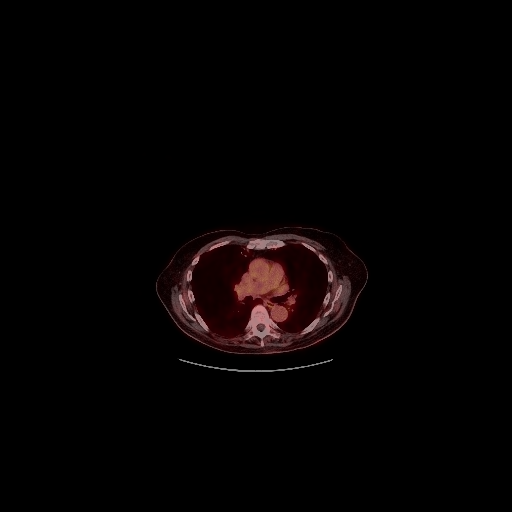
[im 329/329]
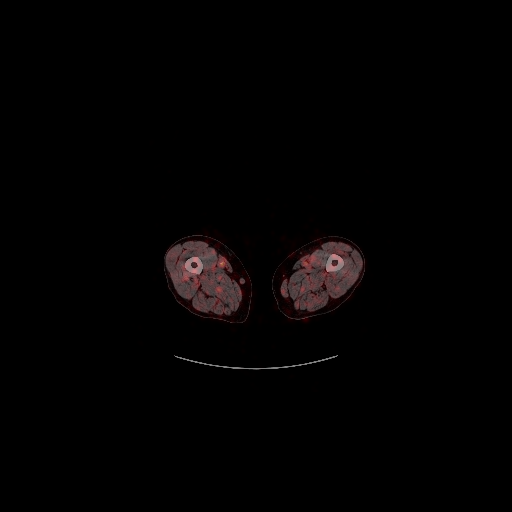

[Series 604: fused coronal · 1 of 107 slices shown]
[im 1/107]
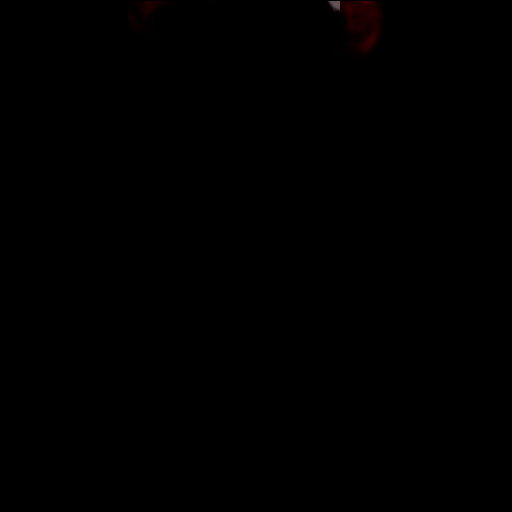

[Series 605: fused sagittal · 2 of 161 slices shown]
[im 1/161]
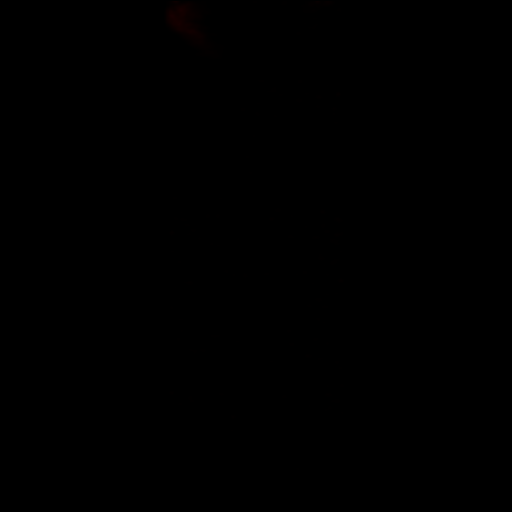
[im 161/161]
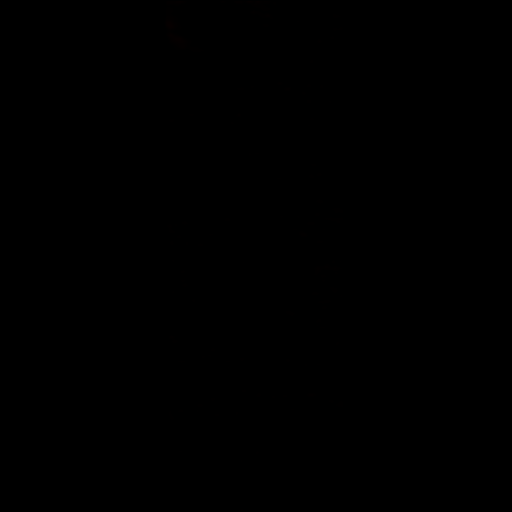

[Series 606: pet axial · 4 of 325 slices shown]
[im 1/325]
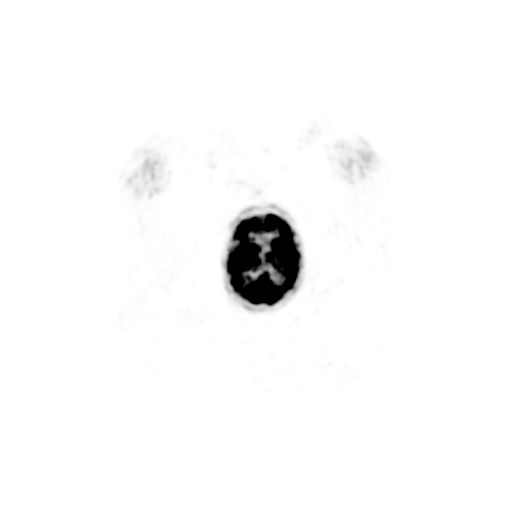
[im 109/325]
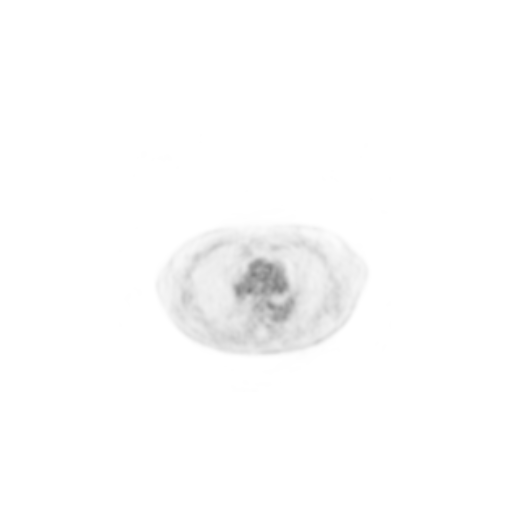
[im 217/325]
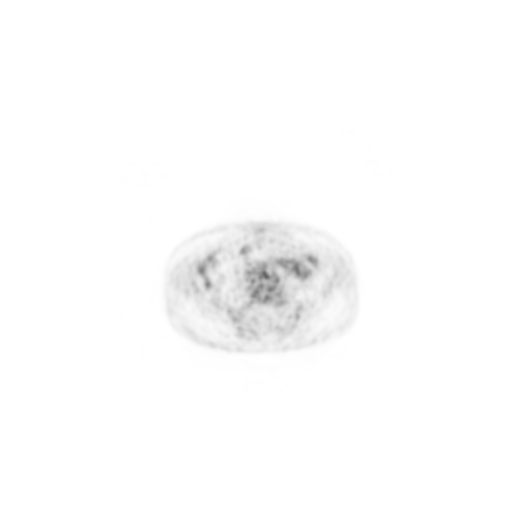
[im 325/325]
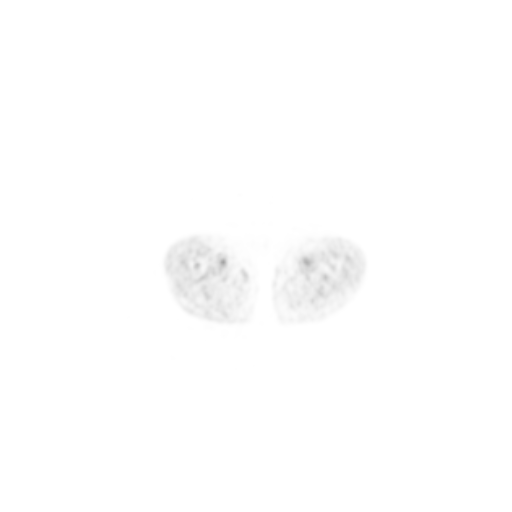

[Series 607: pet coronal · 2 of 153 slices shown]
[im 1/153]
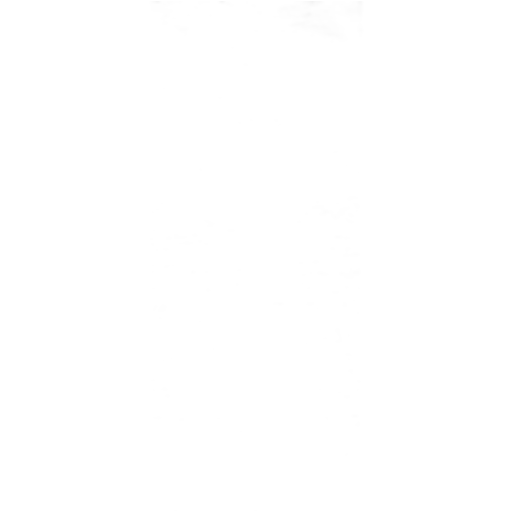
[im 153/153]
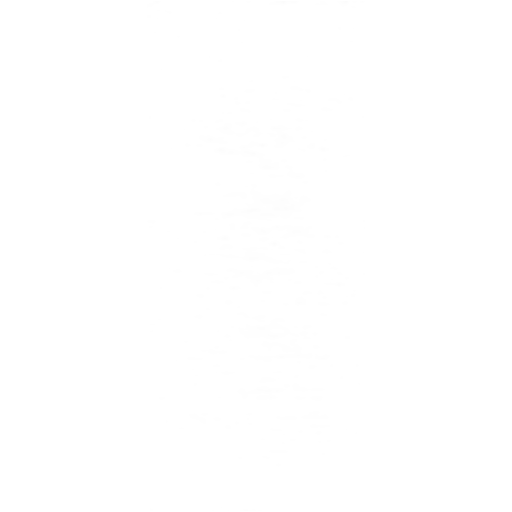

[Series 608: pet sagittal · 2 of 167 slices shown]
[im 1/167]
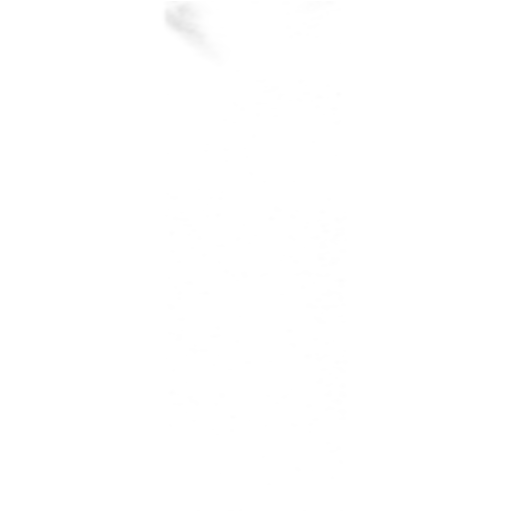
[im 167/167]
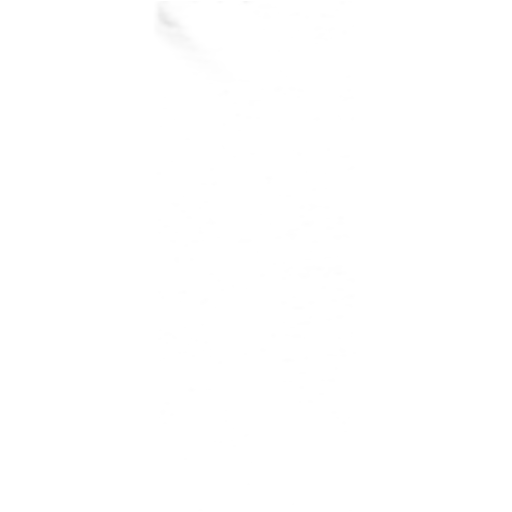

[24 of 25 positions shown; findings below may reference images not displayed]

FINDINGS: Mediastinal blood pool activity: SUV max

Liver activity: SUV max NA

NECK: No areas of abnormal hypermetabolism.

Incidental CT findings: Bilateral carotid atherosclerosis. No
cervical adenopathy.

CHEST: No pulmonary parenchymal or thoracic nodal hypermetabolism.

Incidental CT findings: Aortic atherosclerosis. Mild cardiomegaly.
Aortic and coronary artery calcification. Moderate centrilobular
emphysema. Scattered tiny pulmonary nodules are better evaluated on
the prior diagnostic CT. No superimposed lobar consolidation.

ABDOMEN/PELVIS: No hypermetabolism to correspond to the porta
hepatis adenopathy. Example 3.6 x 1.9 cm and a S.U.V. max of 1.6 on
155/3. Surrounding more central and caudal hypermetabolism in the
porta hepatis is favored to be vascular.

No abdominopelvic nodal hypermetabolism. No hepatic, splenic,
pancreatic or adrenal hypermetabolism.

Low-level hypermetabolism corresponding to sigmoid wall thickening
in the setting of diverticulosis. Example at a S.U.V. max of 5.7 on
250/3.

Incidental CT findings: Bilateral punctate renal collecting system
calculi. Chronic left adrenal nodularity is low-density, favoring an
adenoma. Dependent gallstone. Hepatic cysts. Infrarenal abdominal
aortic dilatation, including at 3.0 cm. Mild prostatomegaly with
bladder wall irregularity, suggesting a component of outlet
obstruction.

SKELETON: No abnormal marrow activity.

Incidental CT findings: Again identified are non FDG avid treated
sclerotic osseous metastasis.
IMPRESSION: 1. The porta hepatis adenopathy on the [DATE] CT is not
hypermetabolic. This is therefore indeterminate, and reactive
etiologies are possible. Consider abdominal CT follow-up at 3
months. Also correlate with any history of liver disease, as
reactive adenopathy in the setting of chronic hepatitis or mild
cirrhosis could have this appearance.
2. Otherwise, no FDG avid metastatic disease identified.
3. Treated sclerotic osseous metastasis, as before.
4. Sigmoid wall thickening and hypermetabolism in the setting of
extensive diverticulosis. Possibly related to muscular hypertrophy.
Correlate with colon cancer screening history.
5. Incidental findings, including: Cholelithiasis. Bilateral
nephrolithiasis.

## 2021-04-30 MED ORDER — FLUDEOXYGLUCOSE F - 18 (FDG) INJECTION
10.4000 | Freq: Once | INTRAVENOUS | Status: AC | PRN
  Administered 2021-04-30: 10.74 via INTRAVENOUS

## 2021-05-02 ENCOUNTER — Other Ambulatory Visit: Payer: PPO

## 2021-05-02 ENCOUNTER — Ambulatory Visit: Payer: PPO | Admitting: Internal Medicine

## 2021-05-02 ENCOUNTER — Ambulatory Visit: Payer: PPO

## 2021-05-05 NOTE — Progress Notes (Signed)
I spoke to patient regarding the results of the pet scan- no progression of disease noted. No changes in current therapy. Follow up as planned.

## 2021-05-06 ENCOUNTER — Inpatient Hospital Stay: Payer: PPO | Admitting: Internal Medicine

## 2021-05-06 ENCOUNTER — Inpatient Hospital Stay: Payer: PPO

## 2021-05-06 ENCOUNTER — Other Ambulatory Visit: Payer: Self-pay

## 2021-05-06 ENCOUNTER — Encounter: Payer: Self-pay | Admitting: Internal Medicine

## 2021-05-06 ENCOUNTER — Inpatient Hospital Stay: Payer: PPO | Attending: Internal Medicine

## 2021-05-06 DIAGNOSIS — C3411 Malignant neoplasm of upper lobe, right bronchus or lung: Secondary | ICD-10-CM | POA: Diagnosis not present

## 2021-05-06 DIAGNOSIS — C7931 Secondary malignant neoplasm of brain: Secondary | ICD-10-CM | POA: Diagnosis not present

## 2021-05-06 DIAGNOSIS — Z79899 Other long term (current) drug therapy: Secondary | ICD-10-CM | POA: Insufficient documentation

## 2021-05-06 DIAGNOSIS — C7800 Secondary malignant neoplasm of unspecified lung: Secondary | ICD-10-CM | POA: Insufficient documentation

## 2021-05-06 DIAGNOSIS — C7951 Secondary malignant neoplasm of bone: Secondary | ICD-10-CM | POA: Insufficient documentation

## 2021-05-06 DIAGNOSIS — Z8521 Personal history of malignant neoplasm of larynx: Secondary | ICD-10-CM | POA: Diagnosis not present

## 2021-05-06 DIAGNOSIS — Z87891 Personal history of nicotine dependence: Secondary | ICD-10-CM | POA: Insufficient documentation

## 2021-05-06 DIAGNOSIS — Z923 Personal history of irradiation: Secondary | ICD-10-CM | POA: Insufficient documentation

## 2021-05-06 DIAGNOSIS — C787 Secondary malignant neoplasm of liver and intrahepatic bile duct: Secondary | ICD-10-CM | POA: Insufficient documentation

## 2021-05-06 DIAGNOSIS — Z7982 Long term (current) use of aspirin: Secondary | ICD-10-CM | POA: Diagnosis not present

## 2021-05-06 LAB — CBC WITH DIFFERENTIAL/PLATELET
Abs Immature Granulocytes: 0.01 10*3/uL (ref 0.00–0.07)
Basophils Absolute: 0.1 10*3/uL (ref 0.0–0.1)
Basophils Relative: 1 %
Eosinophils Absolute: 0.3 10*3/uL (ref 0.0–0.5)
Eosinophils Relative: 5 %
HCT: 44.2 % (ref 39.0–52.0)
Hemoglobin: 14.1 g/dL (ref 13.0–17.0)
Immature Granulocytes: 0 %
Lymphocytes Relative: 30 %
Lymphs Abs: 1.8 10*3/uL (ref 0.7–4.0)
MCH: 31.6 pg (ref 26.0–34.0)
MCHC: 31.9 g/dL (ref 30.0–36.0)
MCV: 99.1 fL (ref 80.0–100.0)
Monocytes Absolute: 0.4 10*3/uL (ref 0.1–1.0)
Monocytes Relative: 6 %
Neutro Abs: 3.6 10*3/uL (ref 1.7–7.7)
Neutrophils Relative %: 58 %
Platelets: 177 10*3/uL (ref 150–400)
RBC: 4.46 MIL/uL (ref 4.22–5.81)
RDW: 14.4 % (ref 11.5–15.5)
WBC: 6.2 10*3/uL (ref 4.0–10.5)
nRBC: 0 % (ref 0.0–0.2)

## 2021-05-06 LAB — COMPREHENSIVE METABOLIC PANEL
ALT: 65 U/L — ABNORMAL HIGH (ref 0–44)
AST: 50 U/L — ABNORMAL HIGH (ref 15–41)
Albumin: 3.7 g/dL (ref 3.5–5.0)
Alkaline Phosphatase: 68 U/L (ref 38–126)
Anion gap: 6 (ref 5–15)
BUN: 17 mg/dL (ref 8–23)
CO2: 28 mmol/L (ref 22–32)
Calcium: 9.2 mg/dL (ref 8.9–10.3)
Chloride: 104 mmol/L (ref 98–111)
Creatinine, Ser: 1.12 mg/dL (ref 0.61–1.24)
GFR, Estimated: >60 mL/min (ref >60)
Glucose, Bld: 122 mg/dL — ABNORMAL HIGH (ref 70–99)
Potassium: 5.1 mmol/L (ref 3.5–5.1)
Sodium: 138 mmol/L (ref 135–145)
Total Bilirubin: 0.3 mg/dL (ref 0.3–1.2)
Total Protein: 7.2 g/dL (ref 6.5–8.1)

## 2021-05-06 MED ORDER — ZOLEDRONIC ACID 4 MG/100ML IV SOLN
4.0000 mg | Freq: Once | INTRAVENOUS | Status: AC
  Administered 2021-05-06: 4 mg via INTRAVENOUS
  Filled 2021-05-06: qty 100

## 2021-05-06 MED ORDER — SODIUM CHLORIDE 0.9 % IV SOLN
Freq: Once | INTRAVENOUS | Status: AC
  Filled 2021-05-06: qty 250

## 2021-05-06 NOTE — Assessment & Plan Note (Addendum)
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 FEb 3rd, 2023-PET scan shows-the porta hepatis adenopathy on the 04/15/2021 CT is not hypermetabolic. This is therefore indeterminate, and reactive eetiologies are possible [especially given history of pancreatitis-December 2022]. Otherwise, no FDG avid metastatic disease identified. Treated sclerotic osseous metastasis.  #For now continue Enco+Bini--continue current therapy.  #Seizures-DEC 2022 [Dr.vaslow]-Keppra twice daily tolerating fairly well.  No further episodes.  # Brain metastases: s/p WBRT [dec 2021];NOV 2022-interval improvement in numerous hemorrhagic metastases in the brain, which have decreased in conspicuity. No new lesions identified.  Monitor for now with repeat imaging 2 months or so.  # Bone lesions-mildly symptomatic; continue Zometa every 4-6 weeks [30 mins; tylenol]. with infusion premedicated with Tylenol at home.;  Calcium normal today.  Proceed with Zometa.  #Diffuse colonic/sigmoid uptake-discussed evaluation.  # DISPOSITION: [mon/-wed appt] # Zometa today- 30 mins  # follow up 4 weeks-; MD; labs- cbc/cmp; possible zometa- 30 mins Dr.B  # I reviewed the blood work- with the patient in detail; also reviewed the imaging independently [as summarized above]; and with the patient in detail.

## 2021-05-06 NOTE — Patient Instructions (Signed)

## 2021-05-06 NOTE — Progress Notes (Signed)
Curtis Andrade NOTE  Patient Care Team: [name], MD as PCP - General (Family Medicine) [name], MD as Consulting Physician (Family Medicine) [name], [name], MD (General Surgery) [place], DO as Consulting Physician (Radiation Oncology)  CHIEF COMPLAINTS/PURPOSE OF CONSULTATION: Lung cancer  #  Oncology History Overview Note  # OCT 2018- STAGE I- A. 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  - PARENCHYMAL MARGIN APPEARS CLEAR; TUMOR WAS 1 CM FROM MARGIN BEFORE REMOVAL OF STAPLE LINE.; NO adjuvant therapy.   # DEC 2021-- METASTATIC CARCINOMA, COMPATIBLE WITH PULMONARY ADENOCARCINOMA [left shoulder Core Biopsy];  NOV 24th PET-right hilar and Hypermetabolic mediastinal lymphadenopathy; small pulmonary nodules; 2 metastatic lesions in the liver/  pancreatic head/left adrenal gland;  muscle and skeletal metastasis.  Subcutaneous nodule-positive for metastatic adeno lung.   # DEC 2021-Brain MRI multiple subcentimeter enhancing brain mets no significant edema - NO SYMPTOMS;s/p RT eval [Dr.Blackburn/Crystal-s/p WBRT- JAN 11th, 2022]  # JAN 14th, 2021-START DABRFENIB +MEKINIST [name] 2022- MUGA scan-EF-58%]; STOPPED MID OCT 2022- sec to acute pancreatitis  # NOV 7th, 2022- Enco+Bini.   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  Short course of Eliquis.smoking-quit 2008.  ---------------------------------------    DIAGNOSIS: LUNG CA  STAGE:  IV      ;GOALS:palliation     Primary cancer of right upper lobe of lung ([place])  04/09/2020 Cancer Staging   Staging form: Lung, AJCC 8th Edition - Clinical: Stage IVB (cN1, cM1c) - Signed by Cammie Sickle, MD on 04/09/2020       HISTORY OF PRESENTING ILLNESS: Ambulating by independently.  Accompanied by his  wife.    Curtis Andrade 76 y.o.  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  Patient denies any further episodes of seizures.  T patient denies any headaches.  No nausea vomiting.  Denies any abdominal pain  Review of Systems  Constitutional:  Positive for malaise/fatigue. Negative for chills, diaphoresis, fever and weight loss.  HENT:  Negative for nosebleeds and sore throat.   Eyes:  Negative for double vision.  Respiratory:  Negative for cough, hemoptysis, sputum production, shortness of breath and wheezing.   Cardiovascular:  Negative for chest pain, palpitations, orthopnea and leg swelling.  Gastrointestinal:  Negative for abdominal pain, blood in stool, constipation, diarrhea, heartburn, melena, nausea and vomiting.  Genitourinary:  Negative for dysuria, frequency and urgency.  Musculoskeletal:  Positive for back pain and joint pain.  Skin: Negative.  Negative for itching and rash.  Neurological:  Positive for weakness. Negative for dizziness, tingling, focal weakness and headaches.  Endo/Heme/Allergies:  Does not bruise/bleed easily.  Psychiatric/Behavioral:  Positive for memory loss. Negative for depression. The patient is not nervous/anxious and does not have insomnia.     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 Baruch Gouty and Dr. Nadeen Landau   Phlebitis 1998   Primary cancer of right upper lobe of lung (  [place]) 01/14/2017   Dr. Genevive Bi performed a wedge resection of RUL.    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/27/2016    SURGICAL HISTORY: Past Surgical History:  Procedure  Laterality Date   COLONOSCOPY  2013   Dr Vira Agar   COLONOSCOPY WITH PROPOFOL N/A 12/23/2016   Procedure: COLONOSCOPY WITH PROPOFOL;  Surgeon: Robert Bellow, MD;  Location: [facility] ENDOSCOPY;  Service: Endoscopy;  Laterality: N/A;   EYE SURGERY     HERNIA REPAIR Right 1990's   TENDON REPAIR Left 03/14/2019   Procedure: TENDON REPAIR AND GRAFT  TRANSFER EXTENSOR INDICIS PROPRIUS LEFT THUMB;  Surgeon: [name], MD;  Location: [facility];  Service: Orthopedics;  Laterality: Left;  AXILLARY BLOCK   THORACOTOMY Right 01/14/2017   Procedure: RIGHT THORACOSCOPYWITH WIDE WEDGE RESECTION,  PREOP BROCHOSCOPY;  Surgeon: [name], MD;  Location: [facility] ORS;  Service: General;  Laterality: Right;   THROAT SURGERY  2010    SOCIAL HISTORY: Social History   Socioeconomic History   Marital status: Married    Spouse name: Not on file   Number of children: Not on file   Years of education: Not on file   Highest education level: Not on file  Occupational History   Not on file  Tobacco Use   Smoking status: Former    Years: 50.00    Types: Cigarettes    Quit date: 03/31/2007    Years since quitting: 14.1   Smokeless tobacco: Never  Vaping Use   Vaping Use: Never used  Substance and Sexual Activity   Alcohol use: No   Drug use: No   Sexual activity: Yes    Birth control/protection: None    Comment: Married  Other Topics Concern   Not on file  Social History Narrative   Not on file   Social Determinants of Health   Financial Resource Strain: Not on file  Food Insecurity: Not on file  Transportation Needs: Not on file  Physical Activity: Not on file  Stress: Not on file  Social Connections: Not on file  Intimate Partner Violence: Not on file    FAMILY HISTORY: Family History  Problem Relation Age of Onset   Alzheimer's disease Mother    Alzheimer's disease Father    Stroke Father    Colon cancer Neg Hx     ALLERGIES:  has No Known Allergies.  MEDICATIONS:   Current Outpatient Medications  Medication Sig Dispense Refill   Ascorbic Acid (VITAMIN C) 1000 MG tablet Take 1,000 mg by mouth daily.     aspirin EC 81 MG tablet Take 81 mg by mouth daily.     binimetinib (MEKTOVI) 15 MG tablet Take 3 tablets (45 mg total) by mouth 2 (two) times daily. 180 tablet 2   Calcium Carbonate-Vit D-Min (CALCIUM 1200 PO) Take 1 tablet by mouth daily.     encorafenib (BRAFTOVI) 75 MG capsule Take 450 mg by mouth daily.     folic acid (FOLVITE) 354 MCG tablet Take 800 mcg by mouth daily.     levETIRAcetam (KEPPRA) 500 MG tablet Take 1 tablet (500 mg total) by mouth 2 (two) times daily. 60 tablet 3   Multiple Vitamin (MULTIVITAMIN WITH MINERALS) TABS tablet Take 1 tablet by mouth daily. Senior Multivitamin.     Multiple Vitamins-Minerals (EMERGEN-C IMMUNE PO) Take 1 tablet by mouth 2 (two) times daily.     Omega-3 1000 MG CAPS Take 1 tablet by mouth 1 day or 1 dose.     omeprazole (PRILOSEC) 40 MG  capsule Take by mouth.     saw palmetto 80 MG capsule Take 80 mg by mouth daily.     No current facility-administered medications for this visit.      Marland Kitchen  PHYSICAL EXAMINATION: ECOG PERFORMANCE STATUS: 0 - Asymptomatic  Vitals:   05/06/21 1357  BP: (!) 156/92  Pulse: 73  Temp: 97.8 F (36.6 C)  SpO2: 100%    Filed Weights   05/06/21 1357  Weight: 203 lb 3.2 oz (92.2 kg)     Physical Exam HENT:     Head: Normocephalic and atraumatic.     Mouth/Throat:     Pharynx: No oropharyngeal exudate.  Eyes:     Pupils: Pupils are equal, round, and reactive to light.  Cardiovascular:     Rate and Rhythm: Normal rate and regular rhythm.  Pulmonary:     Effort: Pulmonary effort is normal. No respiratory distress.     Breath sounds: Normal breath sounds. No wheezing.  Abdominal:     General: Bowel sounds are normal. There is no distension.     Palpations: Abdomen is soft. There is no mass.     Tenderness: There is no abdominal tenderness. There is no guarding  or rebound.  Musculoskeletal:        General: No tenderness. Normal range of motion.     Cervical back: Normal range of motion and neck supple.  Skin:    General: Skin is warm.  Neurological:     Mental Status: He is alert and oriented to person, place, and time.  Psychiatric:        Mood and Affect: Affect normal.  ;  LABORATORY DATA:  I have reviewed the data as listed Lab Results  Component Value Date   WBC 6.2 05/06/2021   HGB 14.1 05/06/2021   HCT 44.2 05/06/2021   MCV 99.1 05/06/2021   PLT 177 05/06/2021   Recent Labs    03/26/21 1022 04/17/21 1046 05/06/21 1345  NA 139 137 138  K 4.3 3.9 5.1  CL 101 104 104  CO2 '30 28 28  ' GLUCOSE 108* 92 122*  BUN '14 15 17  ' CREATININE 1.06 0.92 1.12  CALCIUM 9.3 8.6* 9.2  GFRNONAA >60 >60 >60  PROT 7.0 6.9 7.2  ALBUMIN 3.8 3.7 3.7  AST 28 56* 50*  ALT 22 98* 65*  ALKPHOS 41 41 68  BILITOT 0.5 0.4 0.3    RADIOGRAPHIC STUDIES: I have personally reviewed the radiological images as listed and agreed with the findings in the report. CT CHEST ABDOMEN PELVIS W CONTRAST  Result Date: 04/15/2021 CLINICAL DATA:  History of non-small cell lung cancer. Assess treatment response. EXAM: CT CHEST, ABDOMEN, AND PELVIS WITH CONTRAST TECHNIQUE: Multidetector CT imaging of the chest, abdomen and pelvis was performed following the standard protocol during bolus administration of intravenous contrast. RADIATION DOSE REDUCTION: This exam was performed according to the departmental dose-optimization program which includes automated exposure control, adjustment of the mA and/or kV according to patient size and/or use of iterative reconstruction technique. CONTRAST:  170m OMNIPAQUE IOHEXOL 300 MG/ML  SOLN COMPARISON:  Multiple priors including most recent CT November 08, 2020 and PET-CT June 25, 2020 FINDINGS: CT CHEST FINDINGS Cardiovascular: Aortic atherosclerosis without thoracic aortic aneurysm. No central pulmonary embolus on this nondedicated  study. Normal size heart. No significant pericardial effusion/thickening. Coronary artery calcifications. Mediastinum/Nodes: Decreased size of the right hilar lymph node which was non hypermetabolic on PET-CT June 25, 2020 now measuring 11 mm in short axis  on image 32/2 previously 13 mm. No new or enlarging suspicious mediastinal, hilar axillary lymph nodes. No discrete thyroid nodule. Retained versus refluxed contrast in the esophagus. Lungs/Pleura: Several small right pulmonary nodules are again visualized some of which are decreased in size in comparison to prior. Previously index nodules are as follows: -Anterior right middle lobe pulmonary nodule now measures 3 mm on image 97/4 previously 5 mm. -Interval resolution of the other previously indexed right middle lobe pulmonary nodule. -a nonindexed pulmonary nodule in the lateral right middle lobe measures 3 mm on image 100/4 is unchanged. No new suspicious pulmonary nodules or masses. Mild apical predominant paraseptal emphysematous change. Similar mild biapical pleuroparenchymal scarring. Musculoskeletal: Multiple round sclerotic osseous spinal lesions are unchanged with indexed T12 lesion measuring 15 mm on image 158/4 previously 16 mm. No new suspicious lytic or blastic lesions of bone. CT ABDOMEN PELVIS FINDINGS Hepatobiliary: Stable subcentimeter hypodense hepatic lesions, the largest of which is in the anterior right lobe of the liver and is fluid attenuation. Previously seen metastatic lesions are not demonstrated. Cholelithiasis without evidence of acute cholecystitis. Pancreas: No pancreatic ductal dilation or evidence of acute inflammation. Spleen: Normal in size without focal abnormality. Adrenals/Urinary Tract: Similar nodular thickening of the left adrenal gland. Right adrenal glands unremarkable. No hydronephrosis. Punctate nonobstructive right lower pole renal stone. Urinary bladder is unremarkable for degree of distension. Stomach/Bowel:  Radiopaque enteric contrast material traverses distal loops of small bowel. Stomach is distended without wall thickening. No pathologic dilation of small or large bowel. The appendix and terminal ileum appear normal. Colonic diverticulosis without findings of acute diverticulitis. Vascular/Lymphatic: New enlarged low-density porta hepatis lymph nodes measure up to 2 cm in short axis on image 69/2 with an additional index node measuring 15 mm in short axis on image 72/2. Aortic and branch vessel atherosclerosis with similar aneurysmal dilation of the infrarenal abdominal aorta measuring 3.1 cm in maximum axial dimension. Reproductive: Prostate is unremarkable. Other: No significant abdominopelvic free fluid. Musculoskeletal: Similar appearance of the multiple round sclerotic osseous metastatic lesions involving the spine and pelvic girdle. No new suspicious lytic or blastic lesion of bone identified. IMPRESSION: 1. New enlarged low-density porta hepatis lymph nodes which are nonspecific but suspicious for nodal metastatic disease. Consider further evaluation with PET-CT. 2. Similar appearance of the diffuse sclerotic osseous metastatic lesions throughout the axial and appendicular skeleton. No new suspicious osseous lesions identified 3. Decreased size of the right hilar lymph node which was non hypermetabolic on PET-CT June 25, 2020. No new or enlarging suspicious thoracic lymph nodes. 4. Several small right pulmonary nodules are again visualized some of which are decreased in size in comparison to prior, again favored benign. Continued attention on follow-up imaging suggested. 5. Cholelithiasis without evidence of acute cholecystitis. 6. Unchanged 3.1 cm infrarenal abdominal aortic aneurysm. In this patient continued surveillance on oncologic imaging suggested. Otherwise, recommend follow-up every 3 years. Reference: J Am Coll Radiol 2563;89:373-428. 7. Aortic Atherosclerosis (ICD10-I70.0). These results will be  called to the ordering clinician or representative by the Radiologist Assistant, and communication documented in the PACS or [employer]. Electronically Signed   By: Dahlia Bailiff M.D.   On: 04/15/2021 15:26   NM PET Image Restage (PS) Skull Base to Thigh (F-18 FDG)  Result Date: 05/01/2021 CLINICAL DATA:  Subsequent treatment strategy for non-small-cell lung cancer. Recurrence. On chemotherapy and immunotherapy. Porta hepatis adenopathy on recent CT. EXAM: NUCLEAR MEDICINE PET SKULL BASE TO THIGH TECHNIQUE: 10.7 mCi F-18 FDG was injected intravenously.  Full-ring PET imaging was performed from the skull base to thigh after the radiotracer. CT data was obtained and used for attenuation correction and anatomic localization. Fasting blood glucose: 81 mg/dl COMPARISON:  Chest CT of 04/15/2021.  PET 06/25/2020 FINDINGS: Mediastinal blood pool activity: SUV max 2.0 Liver activity: SUV max NA NECK: No areas of abnormal hypermetabolism. Incidental CT findings: Bilateral carotid atherosclerosis. No cervical adenopathy. CHEST: No pulmonary parenchymal or thoracic nodal hypermetabolism. Incidental CT findings: Aortic atherosclerosis. Mild cardiomegaly. Aortic and coronary artery calcification. Moderate centrilobular emphysema. Scattered tiny pulmonary nodules are better evaluated on the prior diagnostic CT. No superimposed lobar consolidation. ABDOMEN/PELVIS: No hypermetabolism to correspond to the porta hepatis adenopathy. Example 3.6 x 1.9 cm and a S.U.V. max of 1.6 on 155/3. Surrounding more central and caudal hypermetabolism in the porta hepatis is favored to be vascular. No abdominopelvic nodal hypermetabolism. No hepatic, splenic, pancreatic or adrenal hypermetabolism. Low-level hypermetabolism corresponding to sigmoid wall thickening in the setting of diverticulosis. Example at a S.U.V. max of 5.7 on 250/3. Incidental CT findings: Bilateral punctate renal collecting system calculi. Chronic left adrenal  nodularity is low-density, favoring an adenoma. Dependent gallstone. Hepatic cysts. Infrarenal abdominal aortic dilatation, including at 3.0 cm. Mild prostatomegaly with bladder wall irregularity, suggesting a component of outlet obstruction. SKELETON: No abnormal marrow activity. Incidental CT findings: Again identified are non FDG avid treated sclerotic osseous metastasis. IMPRESSION: 1. The porta hepatis adenopathy on the 04/15/2021 CT is not hypermetabolic. This is therefore indeterminate, and reactive etiologies are possible. Consider abdominal CT follow-up at 3 months. Also correlate with any history of liver disease, as reactive adenopathy in the setting of chronic hepatitis or mild cirrhosis could have this appearance. 2. Otherwise, no FDG avid metastatic disease identified. 3. Treated sclerotic osseous metastasis, as before. 4. Sigmoid wall thickening and hypermetabolism in the setting of extensive diverticulosis. Possibly related to muscular hypertrophy. Correlate with colon cancer screening history. 5. Incidental findings, including: Cholelithiasis. Bilateral nephrolithiasis. Electronically Signed   By: Abigail Miyamoto M.D.   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 FEb 3rd, 2023-PET scan shows-the porta hepatis adenopathy on the 04/15/2021 CT is not hypermetabolic. This is therefore indeterminate, and reactive eetiologies are possible [especially given history of pancreatitis-December 2022]. Otherwise, no FDG avid metastatic disease identified. Treated sclerotic osseous  metastasis.  #For now continue Enco+Bini--continue current therapy.  #Seizures-DEC 2022 [Dr.vaslow]-Keppra twice daily tolerating fairly well.  No further episodes.  # Brain metastases: s/p WBRT [dec 2021];NOV 2022-interval improvement in numerous hemorrhagic metastases in the brain, which have decreased in conspicuity. No new lesions identified.  Monitor for now with repeat imaging 2 months or so.  # Bone lesions-mildly symptomatic; continue Zometa every 4-6 weeks [30 mins; tylenol]. with infusion premedicated with Tylenol at home.;  Calcium normal today.  Proceed with Zometa.  #Diffuse colonic/sigmoid uptake-discussed evaluation.  # DISPOSITION: [mon/-wed appt] # Zometa today- 30 mins  # follow up 4 weeks-; MD; labs- cbc/cmp; possible zometa- 30 mins Dr.B  # I reviewed the blood work- with the patient in detail; also reviewed the imaging independently [as summarized above]; and with the  patient in detail.         All questions were answered. The patient knows to call the clinic with any problems, questions or concerns.    Cammie Sickle, MD 05/06/2021 6:01 PM

## 2021-05-06 NOTE — Progress Notes (Signed)
Pt ordered binimetinib but it hasn't came in yet. He has been without it x2 days. Should be here tomorrow.

## 2021-05-19 ENCOUNTER — Encounter: Payer: Self-pay | Admitting: Internal Medicine

## 2021-05-22 DIAGNOSIS — H52223 Regular astigmatism, bilateral: Secondary | ICD-10-CM | POA: Diagnosis not present

## 2021-05-22 DIAGNOSIS — Z9842 Cataract extraction status, left eye: Secondary | ICD-10-CM | POA: Diagnosis not present

## 2021-05-22 DIAGNOSIS — Z9841 Cataract extraction status, right eye: Secondary | ICD-10-CM | POA: Diagnosis not present

## 2021-06-04 ENCOUNTER — Other Ambulatory Visit: Payer: Self-pay

## 2021-06-04 ENCOUNTER — Inpatient Hospital Stay: Payer: PPO | Admitting: Internal Medicine

## 2021-06-04 ENCOUNTER — Inpatient Hospital Stay: Payer: PPO

## 2021-06-04 ENCOUNTER — Encounter: Payer: Self-pay | Admitting: Internal Medicine

## 2021-06-04 ENCOUNTER — Inpatient Hospital Stay: Payer: PPO | Attending: Internal Medicine

## 2021-06-04 DIAGNOSIS — C7931 Secondary malignant neoplasm of brain: Secondary | ICD-10-CM | POA: Insufficient documentation

## 2021-06-04 DIAGNOSIS — C7951 Secondary malignant neoplasm of bone: Secondary | ICD-10-CM | POA: Diagnosis not present

## 2021-06-04 DIAGNOSIS — C3411 Malignant neoplasm of upper lobe, right bronchus or lung: Secondary | ICD-10-CM | POA: Insufficient documentation

## 2021-06-04 DIAGNOSIS — Z87891 Personal history of nicotine dependence: Secondary | ICD-10-CM | POA: Diagnosis not present

## 2021-06-04 LAB — CBC WITH DIFFERENTIAL/PLATELET
Abs Immature Granulocytes: 0.01 10*3/uL (ref 0.00–0.07)
Basophils Absolute: 0.1 10*3/uL (ref 0.0–0.1)
Basophils Relative: 1 %
Eosinophils Absolute: 0.3 10*3/uL (ref 0.0–0.5)
Eosinophils Relative: 5 %
HCT: 43.2 % (ref 39.0–52.0)
Hemoglobin: 14.2 g/dL (ref 13.0–17.0)
Immature Granulocytes: 0 %
Lymphocytes Relative: 28 %
Lymphs Abs: 1.8 10*3/uL (ref 0.7–4.0)
MCH: 31.7 pg (ref 26.0–34.0)
MCHC: 32.9 g/dL (ref 30.0–36.0)
MCV: 96.4 fL (ref 80.0–100.0)
Monocytes Absolute: 0.4 10*3/uL (ref 0.1–1.0)
Monocytes Relative: 6 %
Neutro Abs: 3.8 10*3/uL (ref 1.7–7.7)
Neutrophils Relative %: 60 %
Platelets: 154 10*3/uL (ref 150–400)
RBC: 4.48 MIL/uL (ref 4.22–5.81)
RDW: 14.5 % (ref 11.5–15.5)
WBC: 6.3 10*3/uL (ref 4.0–10.5)
nRBC: 0 % (ref 0.0–0.2)

## 2021-06-04 LAB — COMPREHENSIVE METABOLIC PANEL
ALT: 31 U/L (ref 0–44)
AST: 34 U/L (ref 15–41)
Albumin: 3.9 g/dL (ref 3.5–5.0)
Alkaline Phosphatase: 49 U/L (ref 38–126)
Anion gap: 4 — ABNORMAL LOW (ref 5–15)
BUN: 14 mg/dL (ref 8–23)
CO2: 30 mmol/L (ref 22–32)
Calcium: 9.3 mg/dL (ref 8.9–10.3)
Chloride: 105 mmol/L (ref 98–111)
Creatinine, Ser: 1.19 mg/dL (ref 0.61–1.24)
GFR, Estimated: >60 mL/min (ref >60)
Glucose, Bld: 115 mg/dL — ABNORMAL HIGH (ref 70–99)
Potassium: 4.1 mmol/L (ref 3.5–5.1)
Sodium: 139 mmol/L (ref 135–145)
Total Bilirubin: 0.7 mg/dL (ref 0.3–1.2)
Total Protein: 7.3 g/dL (ref 6.5–8.1)

## 2021-06-04 MED ORDER — SODIUM CHLORIDE 0.9 % IV SOLN
Freq: Once | INTRAVENOUS | Status: AC
  Filled 2021-06-04: qty 250

## 2021-06-04 MED ORDER — ZOLEDRONIC ACID 4 MG/100ML IV SOLN
4.0000 mg | Freq: Once | INTRAVENOUS | Status: AC
  Administered 2021-06-04: 4 mg via INTRAVENOUS
  Filled 2021-06-04: qty 100

## 2021-06-04 NOTE — Assessment & Plan Note (Addendum)
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 FEb 3rd, 2023-PET scan shows-the porta hepatis adenopathy on the 04/15/2021 CT is not hypermetabolic. This is therefore indeterminate, and reactive eetiologies are possible [especially given history of pancreatitis-December 2022]. Otherwise, no FDG avid metastatic disease identified. Treated sclerotic osseous metastasis. ? ?#For now continue Enco+Bini--continue current therapy.STABLE.  Tolerating well without any major side effects. ? ?#? Seizures-DEC 2022 [Dr.vaslow]-Keppra BID ? Poorly [irritable;cognition- worse; shuffling gait since Keppra]. Will discuss with Dr.Vaslow regarding alternative antiepileptic/discontinuation of Keppra. ? ?# Brain metastases: s/p WBRT [dec 2021]; DEC 29th, 2022-interval improvement in numerous hemorrhagic metastases in the brain, which have decreased in conspicuity. No new lesions identified.  Will repeat imaging in 1-2 months ? ?# Bone lesions-mildly symptomatic; continue Zometa every 4-6 weeks [30 mins; tylenol]. with infusion premedicated with Tylenol at home.;  Calcium normal today.  Proceed with Zometa. ? ?#Diffuse colonic/sigmoid uptake-discussed evaluation. ? ?# DISPOSITION: [mon/-wed appt] ?# Zometa today- 30 mins  ?# follow up 4 weeks-; MD; labs- cbc/cmp; possible zometa- 30 mins Dr.B ? ? ? ?

## 2021-06-04 NOTE — Progress Notes (Signed)
Patient is accompanied by his wife today and would like to discuss discontinuing the Keppra that was prescribed by Dr. Mickeal Skinner.  Since taking the Keppra they have noticed a change in his usual calm behavior and occasional unsteady walk.  Next scheduled appt with Dr. Mickeal Skinner is on 08/15/2021. ?

## 2021-06-04 NOTE — Patient Instructions (Signed)

## 2021-06-04 NOTE — Progress Notes (Signed)
[place] NOTE  Patient Care Team: [name], MD as PCP - General (Family Medicine) [name], MD as Consulting Physician (Family Medicine) [name], [name], MD (General Surgery) Thornton Park, DO as Consulting Physician (Radiation Oncology)  CHIEF COMPLAINTS/PURPOSE OF CONSULTATION: Lung cancer  #  Oncology History Overview Note  # OCT 2018- STAGE I- A. 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  - PARENCHYMAL MARGIN APPEARS CLEAR; TUMOR WAS 1 CM FROM MARGIN BEFORE REMOVAL OF STAPLE LINE.; NO adjuvant therapy.   # DEC 2021-- METASTATIC CARCINOMA, COMPATIBLE WITH PULMONARY ADENOCARCINOMA [left shoulder Core Biopsy];  NOV 24th PET-right hilar and Hypermetabolic mediastinal lymphadenopathy; small pulmonary nodules; 2 metastatic lesions in the liver/  pancreatic head/left adrenal gland;  muscle and skeletal metastasis.  Subcutaneous nodule-positive for metastatic adeno lung.   # DEC 2021-Brain MRI multiple subcentimeter enhancing brain mets no significant edema - NO SYMPTOMS;s/p RT eval [Dr.Blackburn/[facility]-s/p WBRT- JAN 11th, 2022]  # JAN 14th, 2021-START DABRFENIB +MEKINIST [name] 2022- MUGA scan-EF-58%]; STOPPED MID OCT 2022- sec to acute pancreatitis  # NOV 7th, 2022- Enco+Bini.   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  Short course of Eliquis.smoking-quit 2008.  ---------------------------------------    DIAGNOSIS: LUNG CA  STAGE:  IV      ;GOALS:palliation     Primary cancer of right upper lobe of lung ([place])  04/09/2020 Cancer Staging   Staging form: Lung, AJCC 8th Edition - Clinical: Stage IVB (cN1, cM1c) - Signed by Cammie Sickle, MD on 04/09/2020       HISTORY OF PRESENTING ILLNESS: Ambulating by independently.  Accompanied by his  wife.    Curtis Andrade 76 y.o.  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  As per the wife patient has had more issues with cognition; shuffling gait; and irritability since starting on Keppra.  No nausea vomiting.  Denies any abdominal pain  Review of Systems  Constitutional:  Positive for malaise/fatigue. Negative for chills, diaphoresis, fever and weight loss.  HENT:  Negative for nosebleeds and sore throat.   Eyes:  Negative for double vision.  Respiratory:  Negative for cough, hemoptysis, sputum production, shortness of breath and wheezing.   Cardiovascular:  Negative for chest pain, palpitations, orthopnea and leg swelling.  Gastrointestinal:  Negative for abdominal pain, blood in stool, constipation, diarrhea, heartburn, melena, nausea and vomiting.  Genitourinary:  Negative for dysuria, frequency and urgency.  Musculoskeletal:  Positive for back pain and joint pain.  Skin: Negative.  Negative for itching and rash.  Neurological:  Positive for weakness. Negative for dizziness, tingling, focal weakness and headaches.  Endo/Heme/Allergies:  Does not bruise/bleed easily.  Psychiatric/Behavioral:  Positive for memory loss. Negative for depression. The patient is not nervous/anxious and does not have insomnia.     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 Baruch Gouty and Dr. Nadeen Landau   Phlebitis 1998   Primary cancer  of right upper lobe of lung ([place]) 01/14/2017   Dr. Genevive Bi performed a wedge resection of RUL.    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/27/2016    SURGICAL  HISTORY: Past Surgical History:  Procedure Laterality Date   COLONOSCOPY  2013   Dr Vira Agar   COLONOSCOPY WITH PROPOFOL N/A 12/23/2016   Procedure: COLONOSCOPY WITH PROPOFOL;  Surgeon: Robert Bellow, MD;  Location: [facility] ENDOSCOPY;  Service: Endoscopy;  Laterality: N/A;   EYE SURGERY     HERNIA REPAIR Right 1990's   TENDON REPAIR Left 03/14/2019   Procedure: TENDON REPAIR AND GRAFT  TRANSFER EXTENSOR INDICIS PROPRIUS LEFT THUMB;  Surgeon: [name], MD;  Location: [facility];  Service: Orthopedics;  Laterality: Left;  AXILLARY BLOCK   THORACOTOMY Right 01/14/2017   Procedure: RIGHT THORACOSCOPYWITH WIDE WEDGE RESECTION,  PREOP BROCHOSCOPY;  Surgeon: [name], MD;  Location: [facility] ORS;  Service: General;  Laterality: Right;   THROAT SURGERY  2010    SOCIAL HISTORY: Social History   Socioeconomic History   Marital status: Married    Spouse name: Not on file   Number of children: Not on file   Years of education: Not on file   Highest education level: Not on file  Occupational History   Not on file  Tobacco Use   Smoking status: Former    Years: 50.00    Types: Cigarettes    Quit date: 03/31/2007    Years since quitting: 14.1   Smokeless tobacco: Never  Vaping Use   Vaping Use: Never used  Substance and Sexual Activity   Alcohol use: No   Drug use: No   Sexual activity: Yes    Birth control/protection: None    Comment: Married  Other Topics Concern   Not on file  Social History Narrative   Not on file   Social Determinants of Health   Financial Resource Strain: Not on file  Food Insecurity: Not on file  Transportation Needs: Not on file  Physical Activity: Not on file  Stress: Not on file  Social Connections: Not on file  Intimate Partner Violence: Not on file    FAMILY HISTORY: Family History  Problem Relation Age of Onset   Alzheimer's disease Mother    Alzheimer's disease Father    Stroke Father    Colon cancer Neg Hx      ALLERGIES:  has No Known Allergies.  MEDICATIONS:  Current Outpatient Medications  Medication Sig Dispense Refill   Ascorbic Acid (VITAMIN C) 1000 MG tablet Take 1,000 mg by mouth daily.     aspirin EC 81 MG tablet Take 81 mg by mouth daily.     binimetinib (MEKTOVI) 15 MG tablet Take 3 tablets (45 mg total) by mouth 2 (two) times daily. 180 tablet 2   Calcium Carbonate-Vit D-Min (CALCIUM 1200 PO) Take 1 tablet by mouth daily.     encorafenib (BRAFTOVI) 75 MG capsule Take 450 mg by mouth daily.     folic acid (FOLVITE) 335 MCG tablet Take 800 mcg by mouth daily.     levETIRAcetam (KEPPRA) 500 MG tablet Take 1 tablet (500 mg total) by mouth 2 (two) times daily. 60 tablet 3   Multiple Vitamin (MULTIVITAMIN WITH MINERALS) TABS tablet Take 1 tablet by mouth daily. Senior Multivitamin.     Multiple Vitamins-Minerals (EMERGEN-C IMMUNE PO) Take 1 tablet by mouth 2 (two) times daily.     Omega-3 1000 MG CAPS Take 1 tablet by mouth 1 day or 1 dose.  omeprazole (PRILOSEC) 40 MG capsule Take by mouth.     saw palmetto 80 MG capsule Take 80 mg by mouth daily.     No current facility-administered medications for this visit.   Facility-Administered Medications Ordered in Other Visits  Medication Dose Route Frequency Provider Last Rate Last Admin   Zoledronic Acid (ZOMETA) IVPB 4 mg  4 mg Intravenous Once Charlaine Dalton R, MD 200 mL/hr at 06/04/21 1552 4 mg at 06/04/21 1552      .  PHYSICAL EXAMINATION: ECOG PERFORMANCE STATUS: 0 - Asymptomatic  Vitals:   06/04/21 1500  BP: (!) 151/75  Pulse: 65  Temp: 97.7 F (36.5 C)    Filed Weights   06/04/21 1500  Weight: 204 lb 9.6 oz (92.8 kg)     Physical Exam HENT:     Head: Normocephalic and atraumatic.     Mouth/Throat:     Pharynx: No oropharyngeal exudate.  Eyes:     Pupils: Pupils are equal, round, and reactive to light.  Cardiovascular:     Rate and Rhythm: Normal rate and regular rhythm.  Pulmonary:     Effort:  Pulmonary effort is normal. No respiratory distress.     Breath sounds: Normal breath sounds. No wheezing.  Abdominal:     General: Bowel sounds are normal. There is no distension.     Palpations: Abdomen is soft. There is no mass.     Tenderness: There is no abdominal tenderness. There is no guarding or rebound.  Musculoskeletal:        General: No tenderness. Normal range of motion.     Cervical back: Normal range of motion and neck supple.  Skin:    General: Skin is warm.  Neurological:     Mental Status: He is alert and oriented to person, place, and time.  Psychiatric:        Mood and Affect: Affect normal.  ;  LABORATORY DATA:  I have reviewed the data as listed Lab Results  Component Value Date   WBC 6.3 06/04/2021   HGB 14.2 06/04/2021   HCT 43.2 06/04/2021   MCV 96.4 06/04/2021   PLT 154 06/04/2021   Recent Labs    04/17/21 1046 05/06/21 1345 06/04/21 1445  NA 137 138 139  K 3.9 5.1 4.1  CL 104 104 105  CO2 '28 28 30  ' GLUCOSE 92 122* 115*  BUN '15 17 14  ' CREATININE 0.92 1.12 1.19  CALCIUM 8.6* 9.2 9.3  GFRNONAA >60 >60 >60  PROT 6.9 7.2 7.3  ALBUMIN 3.7 3.7 3.9  AST 56* 50* 34  ALT 98* 65* 31  ALKPHOS 41 68 49  BILITOT 0.4 0.3 0.7    RADIOGRAPHIC STUDIES: I have personally reviewed the radiological images as listed and agreed with the findings in the report. No results found.  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 FEb 3rd, 2023-PET scan shows-the porta hepatis adenopathy on the 04/15/2021 CT is not hypermetabolic. This is therefore indeterminate,  and reactive eetiologies are possible [especially given history of pancreatitis-December 2022]. Otherwise, no FDG avid metastatic disease identified. Treated sclerotic osseous metastasis.  #For now continue Enco+Bini--continue current  therapy.STABLE.  Tolerating well without any major side effects.  #? Seizures-DEC 2022 [Dr.vaslow]-Keppra BID ? Poorly [irritable;cognition- worse; shuffling gait since Keppra]. Will discuss with Dr.Vaslow regarding alternative antiepileptic/discontinuation of Keppra.  # Brain metastases: s/p WBRT [dec 2021]; DEC 29th, 2022-interval improvement in numerous hemorrhagic metastases in the brain, which have decreased in conspicuity. No new lesions identified.  Will repeat imaging in 1-2 months  # Bone lesions-mildly symptomatic; continue Zometa every 4-6 weeks [30 mins; tylenol]. with infusion premedicated with Tylenol at home.;  Calcium normal today.  Proceed with Zometa.  #Diffuse colonic/sigmoid uptake-discussed evaluation.  # DISPOSITION: [mon/-wed appt] # Zometa today- 30 mins  # follow up 4 weeks-; MD; labs- cbc/cmp; possible zometa- 30 mins Dr.B      All questions were answered. The patient knows to call the clinic with any problems, questions or concerns.    Cammie Sickle, MD 06/04/2021 3:53 PM

## 2021-06-06 ENCOUNTER — Encounter: Payer: Self-pay | Admitting: Internal Medicine

## 2021-06-06 NOTE — Progress Notes (Signed)
I spoke to patient regarding my discussion with dr. Mickeal Skinner regards to continued antiepileptic in the context of his intolerance/ side effects. ? ?Recommend follow up  with dr.Vaslow next week to discuss further. ? ?C- please schedule appointment with Dr. Mickeal Skinner next week.

## 2021-06-09 DIAGNOSIS — D2272 Melanocytic nevi of left lower limb, including hip: Secondary | ICD-10-CM | POA: Diagnosis not present

## 2021-06-09 DIAGNOSIS — L570 Actinic keratosis: Secondary | ICD-10-CM | POA: Diagnosis not present

## 2021-06-09 DIAGNOSIS — D2262 Melanocytic nevi of left upper limb, including shoulder: Secondary | ICD-10-CM | POA: Diagnosis not present

## 2021-06-09 DIAGNOSIS — Z85828 Personal history of other malignant neoplasm of skin: Secondary | ICD-10-CM | POA: Diagnosis not present

## 2021-06-09 DIAGNOSIS — C44319 Basal cell carcinoma of skin of other parts of face: Secondary | ICD-10-CM | POA: Diagnosis not present

## 2021-06-09 DIAGNOSIS — D485 Neoplasm of uncertain behavior of skin: Secondary | ICD-10-CM | POA: Diagnosis not present

## 2021-06-09 DIAGNOSIS — D2271 Melanocytic nevi of right lower limb, including hip: Secondary | ICD-10-CM | POA: Diagnosis not present

## 2021-06-13 ENCOUNTER — Encounter: Payer: Self-pay | Admitting: Internal Medicine

## 2021-06-13 ENCOUNTER — Inpatient Hospital Stay: Payer: PPO | Admitting: Internal Medicine

## 2021-06-13 ENCOUNTER — Other Ambulatory Visit: Payer: Self-pay

## 2021-06-13 VITALS — BP 140/81 | HR 75 | Temp 97.6°F | Resp 16 | Wt 203.0 lb

## 2021-06-13 DIAGNOSIS — R569 Unspecified convulsions: Secondary | ICD-10-CM | POA: Diagnosis not present

## 2021-06-13 DIAGNOSIS — C7931 Secondary malignant neoplasm of brain: Secondary | ICD-10-CM

## 2021-06-13 DIAGNOSIS — C7951 Secondary malignant neoplasm of bone: Secondary | ICD-10-CM | POA: Diagnosis not present

## 2021-06-13 MED ORDER — LAMOTRIGINE 25 MG PO TABS: 50.0000 mg | ORAL_TABLET | Freq: Every day | ORAL | 3 refills | Status: DC

## 2021-06-13 NOTE — Progress Notes (Signed)
? ?[facility] [address]  ?[place], [place] [address] ?[phone] ? ? ?Interval Evaluation ? ?Date of Service: 06/13/21 ?Patient Name: Curtis Andrade. ?Patient MRN: [id number] ?Patient DOB: 1945/06/01 ?Provider: Ventura Sellers, MD ? ?Identifying Statement:  ?Curtis Andrade. is a 76 y.o. male with Brain metastases ([place]) ? ?Focal seizure ([place])  ? ?Primary Cancer: ? ?Oncologic History: ?Oncology History Overview Note  ?# OCT 2018- STAGE I- A. LUNG, RIGHT UPPER LOBE; WEDGE RESECTION: - INVASIVE ADENOCARCINOMA, 1.0 CM, SOLID PREDOMINANT (50% SOLID, 25%  ?LEPIDIC, 20% ACINAR 5% PAPILLARY).  ?- PARENCHYMAL MARGIN APPEARS CLEAR; TUMOR WAS 1 CM FROM MARGIN BEFORE REMOVAL OF STAPLE LINE.; NO adjuvant therapy.  ? ?# DEC 2021-- METASTATIC CARCINOMA, COMPATIBLE WITH PULMONARY ADENOCARCINOMA [left shoulder Core Biopsy];  ?NOV 24th PET-right hilar and Hypermetabolic mediastinal lymphadenopathy; small pulmonary nodules; 2 metastatic lesions in the liver/  pancreatic head/left adrenal gland;  muscle and skeletal metastasis.  Subcutaneous nodule-positive for metastatic adeno lung.  ? ?# DEC 2021-Brain MRI multiple subcentimeter enhancing brain mets no significant edema - NO SYMPTOMS;s/p RT eval [Dr.Blackburn/[facility]rystal-s/p WBRT- JAN 11th, 2022] ? ?# JAN 14th, 2021-START DABRFENIB +MEKINIST [name] 2022- MUGA scan-EF-58%]; STOPPED MID OCT 2022- sec to acute pancreatitis ? ?# NOV 7th, 2022- Enco+Bini.  ? 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 ?# #Atrial arrhythmia-postoperatively.  Short course of Eliquis.smoking-quit 2008.  ?---------------------------------------   ? ?DIAGNOSIS: LUNG CA ? ?STAGE:  IV      ;GOALS:palliation ? ? ?  ?Primary cancer of right upper lobe of lung ([place])  ?04/09/2020 Cancer Staging  ? Staging form: Lung, AJCC 8th Edition ?- Clinical: Stage IVB (cN1, cM1c) - Signed by Cammie Sickle, MD on 04/09/2020 ? ?  ? ?CNS  Oncologic History ?04/09/21: Completes WBRT for 11+ visible metastases ([place]) ? ?Interval History: ?Curtis Andrade. Presents today for clinical follow up.  He and his wife describe no further seizure episodes, but complain of significant irritability and mood swings while on the keppra.  They are very unhappy with this disruption into quality of life.  There is also worsening fatigue described.  No other new or progressive changes, continues to follow with Dr. B for oral TKI. ? ?H+P (04/11/21) Presents today to review recent neurologic episode.  His wife describes episode of sudden confusion, shaking, disorientation, roughly one month ago.  His "whole body was shaking", which he remembers, but shortly after that became confused and non-sensical, which improved slowly over several days.  He describes a period of almost 24 hours with "total blackout, no memory".  Since then, he has been back at his normal baseline, with no recurrence.  He completed whole brain radiation ~1 year ago with Dr. Donella Stade for brain metastases.  Continues on oral TKIs for BRAF+ lung cancer with Dr. Rogue Bussing. ? ?Medications: ?Current Outpatient Medications on File Prior to Visit  ?Medication Sig Dispense Refill  ? Ascorbic Acid (VITAMIN C) 1000 MG tablet Take 1,000 mg by mouth daily.    ? aspirin EC 81 MG tablet Take 81 mg by mouth daily.    ? binimetinib (MEKTOVI) 15 MG tablet Take 3 tablets (45 mg total) by mouth 2 (two) times daily. 180 tablet 2  ? Calcium Carbonate-Vit D-Min (CALCIUM 1200 PO) Take 1 tablet by mouth daily.    ? encorafenib (BRAFTOVI) 75 MG capsule Take 450 mg by mouth daily.    ? folic  acid (FOLVITE) 800 MCG tablet Take 800 mcg by mouth daily.    ? levETIRAcetam (KEPPRA) 500 MG tablet Take 1 tablet (500 mg total) by mouth 2 (two) times daily. 60 tablet 3  ? Multiple Vitamin (MULTIVITAMIN WITH MINERALS) TABS tablet Take 1 tablet by mouth daily. Senior Multivitamin.    ? Multiple Vitamins-Minerals (EMERGEN-C IMMUNE PO)  Take 1 tablet by mouth 2 (two) times daily.    ? Omega-3 1000 MG CAPS Take 1 tablet by mouth 1 day or 1 dose.    ? omeprazole (PRILOSEC) 40 MG capsule Take by mouth.    ? saw palmetto 80 MG capsule Take 80 mg by mouth daily.    ? ?No current facility-administered medications on file prior to visit.  ? ? ?Allergies: No Known Allergies ?Past Medical History:  ?Past Medical History:  ?Diagnosis Date  ? Cataract cortical, senile 11/27/2016  ? Colon polyp 2013  ? Hemorrhoids   ? pt denies  ? Hiccoughs   ? 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 Baruch Gouty and Dr. Nadeen Landau  ? Phlebitis 1998  ? Primary cancer of right upper lobe of lung ([place]) 01/14/2017  ? Dr. Genevive Bi performed a wedge resection of RUL.   ? 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/27/2016  ? ?Past Surgical History:  ?Past Surgical History:  ?Procedure Laterality Date  ? COLONOSCOPY  2013  ? Dr Vira Agar  ? COLONOSCOPY WITH PROPOFOL N/A 12/23/2016  ? Procedure: COLONOSCOPY WITH PROPOFOL;  Surgeon: Robert Bellow, MD;  Location: [facility] ENDOSCOPY;  Service: Endoscopy;  Laterality: N/A;  ? EYE SURGERY    ? HERNIA REPAIR Right 1990's  ? TENDON REPAIR Left 03/14/2019  ? Procedure: TENDON REPAIR AND GRAFT  TRANSFER EXTENSOR INDICIS PROPRIUS LEFT THUMB;  Surgeon: [name], MD;  Location: [facility];  Service: Orthopedics;  Laterality: Left;  AXILLARY BLOCK  ? THORACOTOMY Right 01/14/2017  ? Procedure: RIGHT THORACOSCOPYWITH WIDE WEDGE RESECTION,  PREOP BROCHOSCOPY;  Surgeon: [name], MD;  Location: [facility] ORS;  Service: General;  Laterality: Right;  ? THROAT SURGERY  2010  ? ?Social History:  ?Social History  ? ?Socioeconomic History  ? Marital status: Married  ?  Spouse name: Not on file  ? Number of children: Not on file  ? Years of education: Not on file  ? Highest education level: Not on file  ?Occupational History  ? Not on file  ?Tobacco Use  ?  Smoking status: Former  ?  Years: 50.00  ?  Types: Cigarettes  ?  Quit date: 03/31/2007  ?  Years since quitting: 14.2  ? Smokeless tobacco: Never  ?Vaping Use  ? Vaping Use: Never used  ?Substance and Sexual Activity  ? Alcohol use: No  ? Drug use: No  ? Sexual activity: Yes  ?  Birth control/protection: None  ?  Comment: Married  ?Other Topics Concern  ? Not on file  ?Social History Narrative  ? Not on file  ? ?Social Determinants of Health  ? ?Financial Resource Strain: Not on file  ?Food Insecurity: Not on file  ?Transportation Needs: Not on file  ?Physical Activity: Not on file  ?Stress: Not on file  ?Social Connections: Not on file  ?Intimate Partner Violence: Not on file  ? ?Family History:  ?Family History  ?Problem Relation Age of Onset  ? Alzheimer's disease Mother   ? Alzheimer's disease Father   ?  Stroke Father   ? Colon cancer Neg Hx   ? ? ?Review of Systems: ?Constitutional: Doesn't report fevers, chills or abnormal weight loss ?Eyes: Doesn't report blurriness of vision ?Ears, nose, mouth, throat, and face: Doesn't report sore throat ?Respiratory: Doesn't report cough, dyspnea or wheezes ?Cardiovascular: Doesn't report palpitation, chest discomfort  ?Gastrointestinal:  Doesn't report nausea, constipation, diarrhea ?GU: Doesn't report incontinence ?Skin: Doesn't report skin rashes ?Neurological: Per HPI ?Musculoskeletal: Doesn't report joint pain ?Behavioral/Psych: Doesn't report anxiety ? ?Physical Exam: ?Vitals:  ? 06/13/21 1018  ?BP: 140/81  ?Pulse: 75  ?Resp: 16  ?Temp: 97.6 ?F (36.4 ?C)  ?SpO2: 99%  ? ?KPS: 90. ?General: Alert, cooperative, pleasant, in no acute distress ?Head: Normal ?EENT: No conjunctival injection or scleral icterus.  ?Lungs: Resp effort normal ?Cardiac: Regular rate ?Abdomen: Non-distended abdomen ?Skin: No rashes cyanosis or petechiae. ?Extremities: No clubbing or edema ? ?Neurologic Exam: ?Mental Status: Awake, alert, attentive to examiner. Oriented to self and environment.  Language is fluent with intact comprehension.  ?Cranial Nerves: Visual acuity is grossly normal. Visual fields are full. Extra-ocular movements intact. No ptosis. Face is symmetric ?Motor: Tone and bulk ar

## 2021-06-17 ENCOUNTER — Other Ambulatory Visit: Payer: Self-pay | Admitting: Radiation Therapy

## 2021-06-27 ENCOUNTER — Ambulatory Visit: Payer: PPO | Admitting: Internal Medicine

## 2021-07-03 ENCOUNTER — Encounter: Payer: Self-pay | Admitting: Nurse Practitioner

## 2021-07-03 ENCOUNTER — Inpatient Hospital Stay: Payer: PPO

## 2021-07-03 ENCOUNTER — Inpatient Hospital Stay: Payer: PPO | Admitting: Nurse Practitioner

## 2021-07-03 ENCOUNTER — Inpatient Hospital Stay: Payer: PPO | Attending: Internal Medicine

## 2021-07-03 ENCOUNTER — Other Ambulatory Visit: Payer: Self-pay

## 2021-07-03 VITALS — BP 126/80 | HR 77 | Temp 98.2°F | Resp 16 | Wt 204.0 lb

## 2021-07-03 DIAGNOSIS — M549 Dorsalgia, unspecified: Secondary | ICD-10-CM | POA: Insufficient documentation

## 2021-07-03 DIAGNOSIS — Z87891 Personal history of nicotine dependence: Secondary | ICD-10-CM | POA: Diagnosis not present

## 2021-07-03 DIAGNOSIS — C7951 Secondary malignant neoplasm of bone: Secondary | ICD-10-CM

## 2021-07-03 DIAGNOSIS — R2689 Other abnormalities of gait and mobility: Secondary | ICD-10-CM | POA: Diagnosis not present

## 2021-07-03 DIAGNOSIS — Z818 Family history of other mental and behavioral disorders: Secondary | ICD-10-CM | POA: Diagnosis not present

## 2021-07-03 DIAGNOSIS — C3411 Malignant neoplasm of upper lobe, right bronchus or lung: Secondary | ICD-10-CM

## 2021-07-03 DIAGNOSIS — C787 Secondary malignant neoplasm of liver and intrahepatic bile duct: Secondary | ICD-10-CM | POA: Diagnosis not present

## 2021-07-03 DIAGNOSIS — C7931 Secondary malignant neoplasm of brain: Secondary | ICD-10-CM | POA: Insufficient documentation

## 2021-07-03 DIAGNOSIS — Z8521 Personal history of malignant neoplasm of larynx: Secondary | ICD-10-CM | POA: Insufficient documentation

## 2021-07-03 DIAGNOSIS — Z923 Personal history of irradiation: Secondary | ICD-10-CM | POA: Diagnosis not present

## 2021-07-03 DIAGNOSIS — R531 Weakness: Secondary | ICD-10-CM | POA: Diagnosis not present

## 2021-07-03 DIAGNOSIS — M2550 Pain in unspecified joint: Secondary | ICD-10-CM | POA: Insufficient documentation

## 2021-07-03 DIAGNOSIS — R413 Other amnesia: Secondary | ICD-10-CM | POA: Insufficient documentation

## 2021-07-03 DIAGNOSIS — Z823 Family history of stroke: Secondary | ICD-10-CM | POA: Diagnosis not present

## 2021-07-03 DIAGNOSIS — Z8719 Personal history of other diseases of the digestive system: Secondary | ICD-10-CM | POA: Insufficient documentation

## 2021-07-03 DIAGNOSIS — Z79899 Other long term (current) drug therapy: Secondary | ICD-10-CM | POA: Diagnosis not present

## 2021-07-03 DIAGNOSIS — R5383 Other fatigue: Secondary | ICD-10-CM | POA: Diagnosis not present

## 2021-07-03 LAB — COMPREHENSIVE METABOLIC PANEL
ALT: 35 U/L (ref 0–44)
AST: 34 U/L (ref 15–41)
Albumin: 3.9 g/dL (ref 3.5–5.0)
Alkaline Phosphatase: 40 U/L (ref 38–126)
Anion gap: 4 — ABNORMAL LOW (ref 5–15)
BUN: 25 mg/dL — ABNORMAL HIGH (ref 8–23)
CO2: 28 mmol/L (ref 22–32)
Calcium: 8.9 mg/dL (ref 8.9–10.3)
Chloride: 105 mmol/L (ref 98–111)
Creatinine, Ser: 1.21 mg/dL (ref 0.61–1.24)
GFR, Estimated: >60 mL/min (ref >60)
Glucose, Bld: 120 mg/dL — ABNORMAL HIGH (ref 70–99)
Potassium: 4.8 mmol/L (ref 3.5–5.1)
Sodium: 137 mmol/L (ref 135–145)
Total Bilirubin: 0.5 mg/dL (ref 0.3–1.2)
Total Protein: 7 g/dL (ref 6.5–8.1)

## 2021-07-03 LAB — CBC WITH DIFFERENTIAL/PLATELET
Abs Immature Granulocytes: 0 10*3/uL (ref 0.00–0.07)
Basophils Absolute: 0.1 10*3/uL (ref 0.0–0.1)
Basophils Relative: 1 %
Eosinophils Absolute: 0.2 10*3/uL (ref 0.0–0.5)
Eosinophils Relative: 4 %
HCT: 43.7 % (ref 39.0–52.0)
Hemoglobin: 14.4 g/dL (ref 13.0–17.0)
Immature Granulocytes: 0 %
Lymphocytes Relative: 28 %
Lymphs Abs: 1.7 10*3/uL (ref 0.7–4.0)
MCH: 31.9 pg (ref 26.0–34.0)
MCHC: 33 g/dL (ref 30.0–36.0)
MCV: 96.9 fL (ref 80.0–100.0)
Monocytes Absolute: 0.6 10*3/uL (ref 0.1–1.0)
Monocytes Relative: 9 %
Neutro Abs: 3.5 10*3/uL (ref 1.7–7.7)
Neutrophils Relative %: 58 %
Platelets: 157 10*3/uL (ref 150–400)
RBC: 4.51 MIL/uL (ref 4.22–5.81)
RDW: 14.6 % (ref 11.5–15.5)
WBC: 6 10*3/uL (ref 4.0–10.5)
nRBC: 0 % (ref 0.0–0.2)

## 2021-07-03 MED ORDER — SODIUM CHLORIDE 0.9 % IV SOLN
Freq: Once | INTRAVENOUS | Status: AC
  Filled 2021-07-03: qty 250

## 2021-07-03 MED ORDER — ZOLEDRONIC ACID 4 MG/100ML IV SOLN
4.0000 mg | Freq: Once | INTRAVENOUS | Status: AC
  Administered 2021-07-03: 4 mg via INTRAVENOUS
  Filled 2021-07-03: qty 100

## 2021-07-03 NOTE — Progress Notes (Signed)
[place] ?CONSULT NOTE ? ?Patient Care Team: ?[name], MD as PCP - General (Family Medicine) ?[name], MD as Consulting Physician (Family Medicine) ?Robert Bellow, MD (General Surgery) ?Thornton Park, DO as Consulting Physician (Radiation Oncology) ? ?CHIEF COMPLAINTS/PURPOSE OF CONSULTATION: Lung cancer ? ?#  ?Oncology History Overview Note  ?# OCT 2018- STAGE I- A. LUNG, RIGHT UPPER LOBE; WEDGE RESECTION: - INVASIVE ADENOCARCINOMA, 1.0 CM, SOLID PREDOMINANT (50% SOLID, 25%  ?LEPIDIC, 20% ACINAR 5% PAPILLARY).  ?- PARENCHYMAL MARGIN APPEARS CLEAR; TUMOR WAS 1 CM FROM MARGIN BEFORE REMOVAL OF STAPLE LINE.; NO adjuvant therapy.  ? ?# DEC 2021-- METASTATIC CARCINOMA, COMPATIBLE WITH PULMONARY ADENOCARCINOMA [left shoulder Core Biopsy];  ?NOV 24th PET-right hilar and Hypermetabolic mediastinal lymphadenopathy; small pulmonary nodules; 2 metastatic lesions in the liver/  pancreatic head/left adrenal gland;  muscle and skeletal metastasis.  Subcutaneous nodule-positive for metastatic adeno lung.  ? ?# DEC 2021-Brain MRI multiple subcentimeter enhancing brain mets no significant edema - NO SYMPTOMS;s/p RT eval [Dr.Blackburn/Crystal-s/p WBRT- JAN 11th, 2022] ? ?# JAN 14th, 2021-START DABRFENIB +MEKINIST [name] 2022- MUGA scan-EF-58%]; STOPPED MID OCT 2022- sec to acute pancreatitis ? ?# NOV 7th, 2022- Enco+Bini.  ? 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 ?# #Atrial arrhythmia-postoperatively.  Short course of Eliquis.smoking-quit 2008.  ?---------------------------------------   ? ?DIAGNOSIS: LUNG CA ? ?STAGE:  IV      ;GOALS:palliation ? ? ?  ?Primary cancer of right upper lobe of lung ([place])  ?04/09/2020 Cancer Staging  ? Staging form: Lung, AJCC 8th Edition ?- Clinical: Stage IVB (cN1, cM1c) - Signed by Cammie Sickle, MD on 04/09/2020 ?  ? ? ? ?HISTORY OF PRESENTING ILLNESS: Ambulating by independently.  Accompanied by his  wife.   ? ?Curtis Andrade. 76 y.o. 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  ? ?As per the wife patient has had more issues with cognition; shuffling gait; and irritability since starting on Keppra.  No nausea vomiting.  Denies any abdominal pain ? ?Review of Systems  ?Constitutional:  Positive for malaise/fatigue. Negative for chills, diaphoresis, fever and weight loss.  ?HENT:  Negative for nosebleeds and sore throat.   ?Eyes:  Negative for double vision.  ?Respiratory:  Negative for cough, hemoptysis, sputum production, shortness of breath and wheezing.   ?Cardiovascular:  Negative for chest pain, palpitations, orthopnea and leg swelling.  ?Gastrointestinal:  Negative for abdominal pain, blood in stool, constipation, diarrhea, heartburn, melena, nausea and vomiting.  ?Genitourinary:  Negative for dysuria, frequency and urgency.  ?Musculoskeletal:  Positive for back pain and joint pain.  ?Skin: Negative.  Negative for itching and rash.  ?Neurological:  Positive for weakness. Negative for dizziness, tingling, focal weakness and headaches.  ?Endo/Heme/Allergies:  Does not bruise/bleed easily.  ?Psychiatric/Behavioral:  Positive for memory loss. Negative for depression. The patient is not nervous/anxious and does not have insomnia.   ? ? ?MEDICAL HISTORY:  ?Past Medical History:  ?Diagnosis Date  ? Cataract cortical, senile 11/27/2016  ? Colon polyp 2013  ? Hemorrhoids   ? pt denies  ? Hiccoughs   ? 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 Baruch Gouty and Dr. Nadeen Landau  ? Phlebitis 1998  ?  Primary cancer of right upper lobe of lung ([place]) 01/14/2017  ? Dr. Genevive Bi performed a wedge resection of RUL.   ? Psoriasis, unspecified  11/27/2016  ? ? ?SURGICAL HISTORY: ?Past Surgical History:  ?Procedure Laterality Date  ? COLONOSCOPY  2013  ? Dr Vira Agar  ? COLONOSCOPY WITH PROPOFOL N/A 12/23/2016  ? Procedure: COLONOSCOPY WITH PROPOFOL;  Surgeon: Robert Bellow, MD;  Location: [facility] ENDOSCOPY;  Service: Endoscopy;  Laterality: N/A;  ? EYE SURGERY    ? HERNIA REPAIR Right 1990's  ? TENDON REPAIR Left 03/14/2019  ? Procedure: TENDON REPAIR AND GRAFT  TRANSFER EXTENSOR INDICIS PROPRIUS LEFT THUMB;  Surgeon: [name], MD;  Location: [facility];  Service: Orthopedics;  Laterality: Left;  AXILLARY BLOCK  ? THORACOTOMY Right 01/14/2017  ? Procedure: RIGHT THORACOSCOPYWITH WIDE WEDGE RESECTION,  PREOP BROCHOSCOPY;  Surgeon: [name], MD;  Location: [facility] ORS;  Service: General;  Laterality: Right;  ? THROAT SURGERY  2010  ? ? ?SOCIAL HISTORY: ?Social History  ? ?Socioeconomic History  ? Marital status: Married  ?  Spouse name: Not on file  ? Number of children: Not on file  ? Years of education: Not on file  ? Highest education level: Not on file  ?Occupational History  ? Not on file  ?Tobacco Use  ? Smoking status: Former  ?  Years: 50.00  ?  Types: Cigarettes  ?  Quit date: 03/31/2007  ?  Years since quitting: 14.2  ? Smokeless tobacco: Never  ?Vaping Use  ? Vaping Use: Never used  ?Substance and Sexual Activity  ? Alcohol use: No  ? Drug use: No  ? Sexual activity: Yes  ?  Birth control/protection: None  ?  Comment: Married  ?Other Topics Concern  ? Not on file  ?Social History Narrative  ? Not on file  ? ?Social Determinants of Health  ? ?Financial Resource Strain: Not on file  ?Food Insecurity: Not on file  ?Transportation Needs: Not on file  ?Physical Activity: Not on file  ?Stress: Not on file  ?Social Connections: Not on file  ?Intimate Partner Violence: Not on file  ? ? ?FAMILY HISTORY: ?Family History  ?Problem Relation Age of Onset  ? Alzheimer's disease Mother   ? Alzheimer's disease Father   ? Stroke Father   ? Colon  cancer Neg Hx   ? ? ?ALLERGIES:  has No Known Allergies. ? ?MEDICATIONS:  ?Current Outpatient Medications  ?Medication Sig Dispense Refill  ? Ascorbic Acid (VITAMIN C) 1000 MG tablet Take 1,000 mg by mouth daily.    ? aspirin EC 81 MG tablet Take 81 mg by mouth daily.    ? binimetinib (MEKTOVI) 15 MG tablet Take 3 tablets (45 mg total) by mouth 2 (two) times daily. 180 tablet 2  ? Calcium Carbonate-Vit D-Min (CALCIUM 1200 PO) Take 1 tablet by mouth daily.    ? encorafenib (BRAFTOVI) 75 MG capsule Take 450 mg by mouth daily.    ? folic acid (FOLVITE) 564 MCG tablet Take 800 mcg by mouth daily.    ? lamoTRIgine (LAMICTAL) 25 MG tablet Take 2 tablets (50 mg total) by mouth daily. 60 tablet 3  ? levETIRAcetam (KEPPRA) 500 MG tablet Take 1 tablet (500 mg total) by mouth 2 (two) times daily. 60 tablet 3  ? Multiple Vitamin (MULTIVITAMIN WITH MINERALS) TABS tablet Take 1 tablet by mouth daily. Senior Multivitamin.    ? Multiple Vitamins-Minerals (EMERGEN-C IMMUNE PO) Take 1 tablet by mouth 2 (two) times  daily.    ? Omega-3 1000 MG CAPS Take 1 tablet by mouth 1 day or 1 dose.    ? omeprazole (PRILOSEC) 40 MG capsule Take by mouth.    ? saw palmetto 80 MG capsule Take 80 mg by mouth daily.    ? ?No current facility-administered medications for this visit.  ? ? ?  ?. ? ?PHYSICAL EXAMINATION: ?ECOG PERFORMANCE STATUS: 0 - Asymptomatic ? ?There were no vitals filed for this visit. ? ? ?There were no vitals filed for this visit. ? ? ? ?Physical Exam ?HENT:  ?   Head: Normocephalic and atraumatic.  ?   Mouth/Throat:  ?   Pharynx: No oropharyngeal exudate.  ?Eyes:  ?   Pupils: Pupils are equal, round, and reactive to light.  ?Cardiovascular:  ?   Rate and Rhythm: Normal rate and regular rhythm.  ?Pulmonary:  ?   Effort: Pulmonary effort is normal. No respiratory distress.  ?   Breath sounds: Normal breath sounds. No wheezing.  ?Abdominal:  ?   General: Bowel sounds are normal. There is no distension.  ?   Palpations: Abdomen is  soft. There is no mass.  ?   Tenderness: There is no abdominal tenderness. There is no guarding or rebound.  ?Musculoskeletal:     ?   General: No tenderness. Normal range of motion.  ?   Cervical back: Constance Holster

## 2021-07-03 NOTE — Progress Notes (Signed)
Patient here for lab results he reports that his mental status has improved since the change in his siezure medication. ?

## 2021-07-29 ENCOUNTER — Ambulatory Visit
Admission: RE | Admit: 2021-07-29 | Discharge: 2021-07-29 | Disposition: A | Discharge status: Home / Self Care | Payer: PPO | Source: Ambulatory Visit | Attending: Nurse Practitioner | Admitting: Nurse Practitioner

## 2021-07-29 DIAGNOSIS — C44319 Basal cell carcinoma of skin of other parts of face: Secondary | ICD-10-CM | POA: Diagnosis not present

## 2021-07-29 DIAGNOSIS — N200 Calculus of kidney: Secondary | ICD-10-CM | POA: Diagnosis not present

## 2021-07-29 DIAGNOSIS — C3490 Malignant neoplasm of unspecified part of unspecified bronchus or lung: Secondary | ICD-10-CM | POA: Diagnosis not present

## 2021-07-29 DIAGNOSIS — C7951 Secondary malignant neoplasm of bone: Secondary | ICD-10-CM | POA: Insufficient documentation

## 2021-07-29 DIAGNOSIS — J439 Emphysema, unspecified: Secondary | ICD-10-CM | POA: Diagnosis not present

## 2021-07-29 DIAGNOSIS — C4441 Basal cell carcinoma of skin of scalp and neck: Secondary | ICD-10-CM | POA: Diagnosis not present

## 2021-07-29 DIAGNOSIS — K5730 Diverticulosis of large intestine without perforation or abscess without bleeding: Secondary | ICD-10-CM | POA: Diagnosis not present

## 2021-07-29 DIAGNOSIS — C3411 Malignant neoplasm of upper lobe, right bronchus or lung: Secondary | ICD-10-CM | POA: Diagnosis not present

## 2021-07-29 DIAGNOSIS — G35 Multiple sclerosis: Secondary | ICD-10-CM | POA: Diagnosis not present

## 2021-07-29 DIAGNOSIS — I714 Abdominal aortic aneurysm, without rupture, unspecified: Secondary | ICD-10-CM | POA: Diagnosis not present

## 2021-07-29 DIAGNOSIS — K8020 Calculus of gallbladder without cholecystitis without obstruction: Secondary | ICD-10-CM | POA: Diagnosis not present

## 2021-07-29 IMAGING — CT CT CHEST-ABD-PELV W/ CM
2 of 5 series · 12 of 36 positions shown, 14 images · IV contrast (agent unspecified)
Comparison: PET-CT [DATE].  Chest abdomen pelvis CT [DATE]

CLINICAL DATA: Non-small-cell lung cancer. Restaging. * Tracking
Code: BO *

EXAM:
CT CHEST, ABDOMEN, AND PELVIS WITH CONTRAST
TECHNIQUE: Multidetector CT imaging of the chest, abdomen and pelvis was
performed following the standard protocol during bolus
administration of intravenous contrast.

[Series 2: cap with · axial · 0.84mm/px · z∈[-592,-22]mm · 9 of 144 slices shown, 11 images]
[im 15/144  mediastinal]
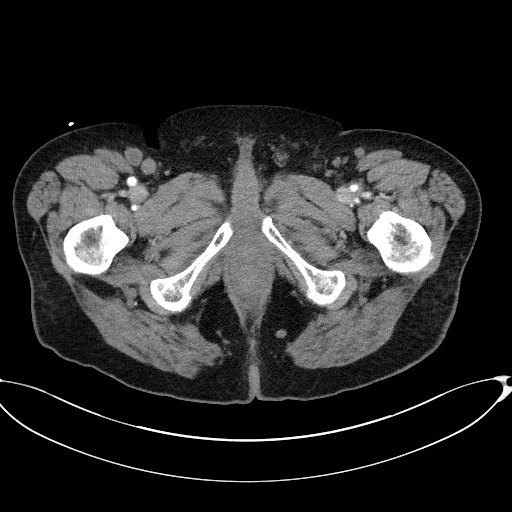
[im 15/144  bone]
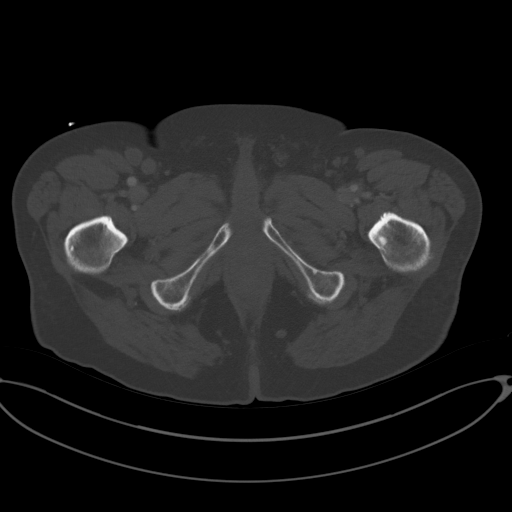
[im 29/144  mediastinal]
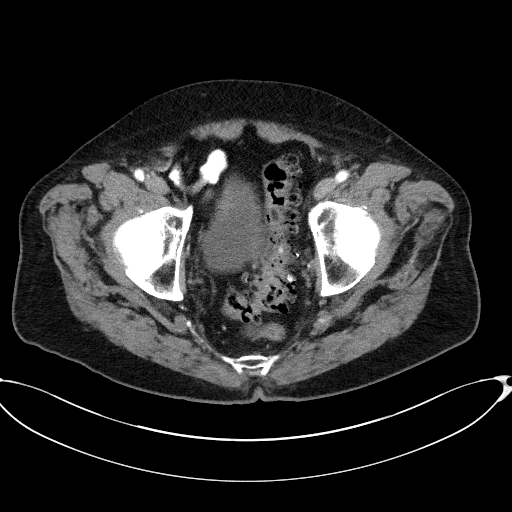
[im 43/144  mediastinal]
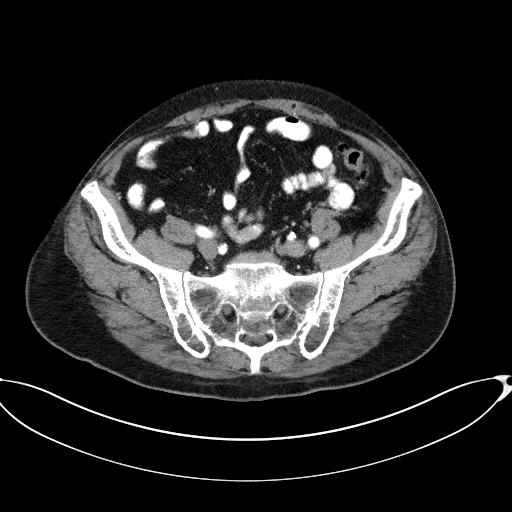
[im 58/144  mediastinal]
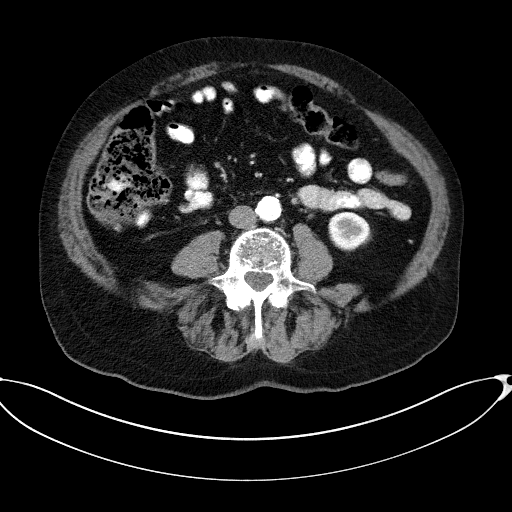
[im 72/144  mediastinal]
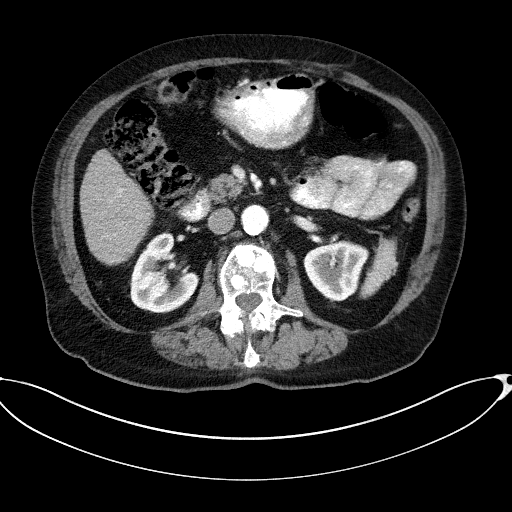
[im 86/144  mediastinal]
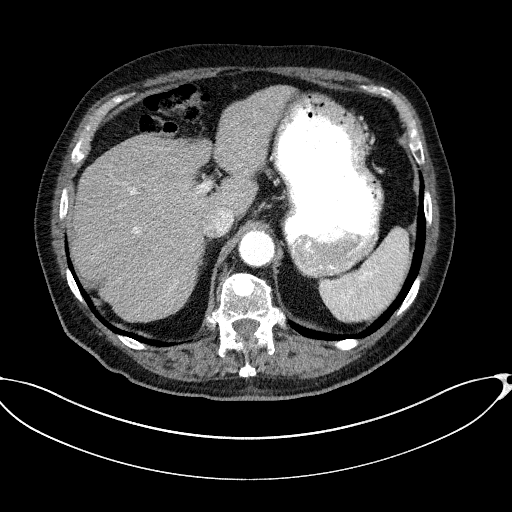
[im 101/144  mediastinal]
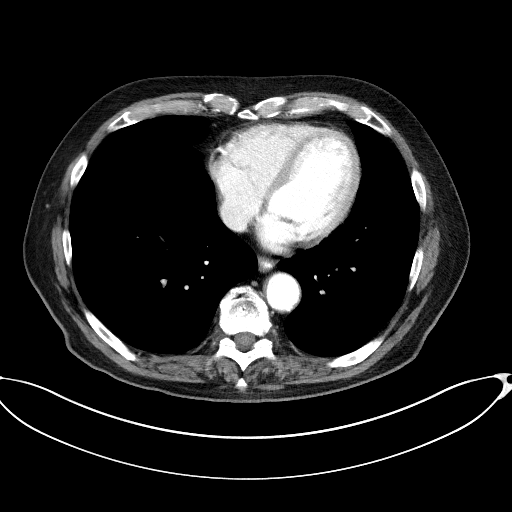
[im 115/144  mediastinal]
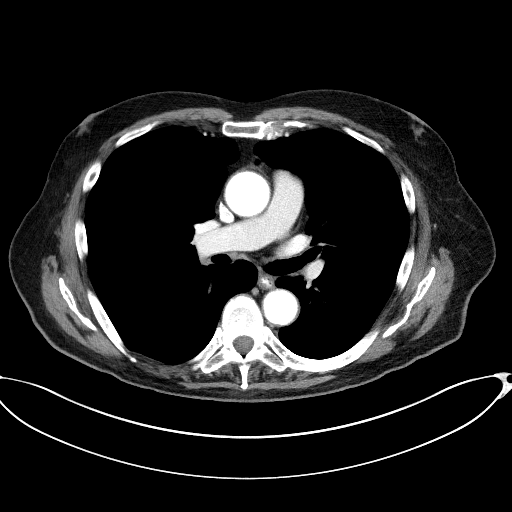
[im 129/144  mediastinal]
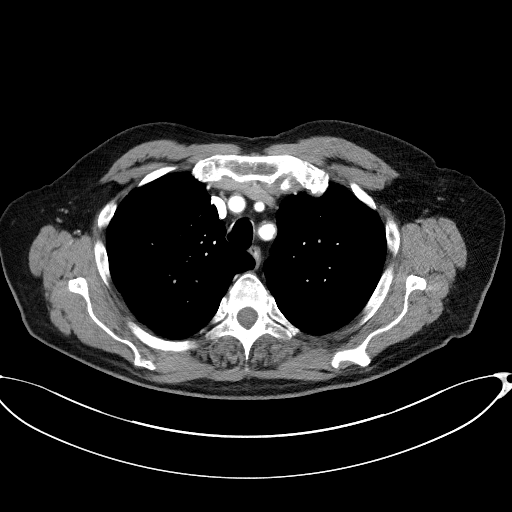
[im 129/144  bone]
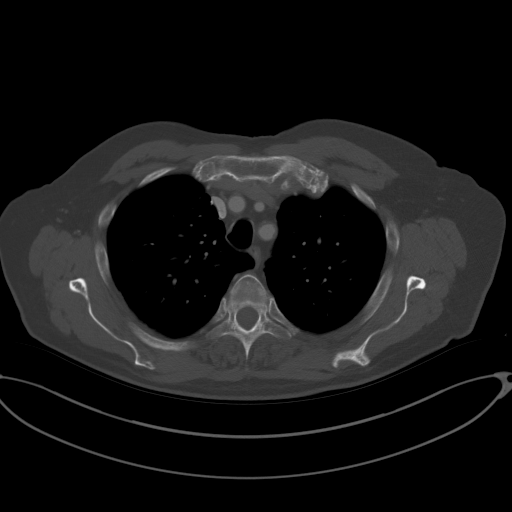

[Series 5: coronals · coronal · 0.87mm/px · 3 of 158 slices shown]
[im 32/158  mediastinal]
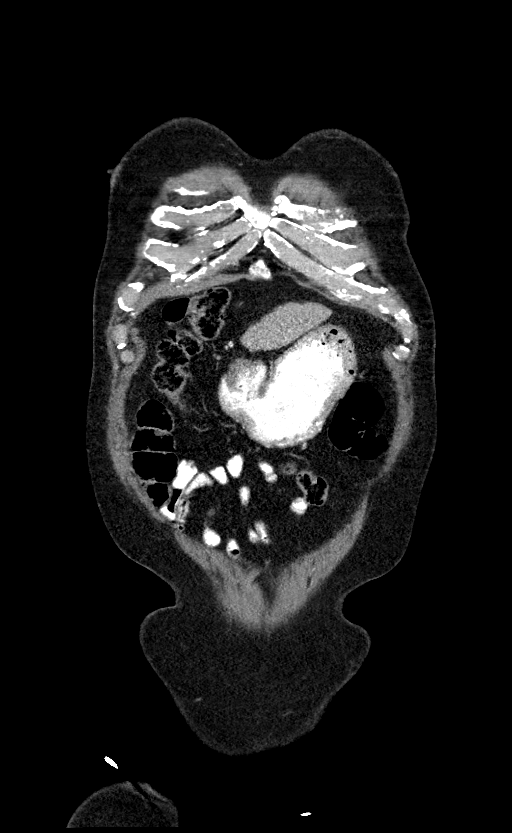
[im 63/158  mediastinal]
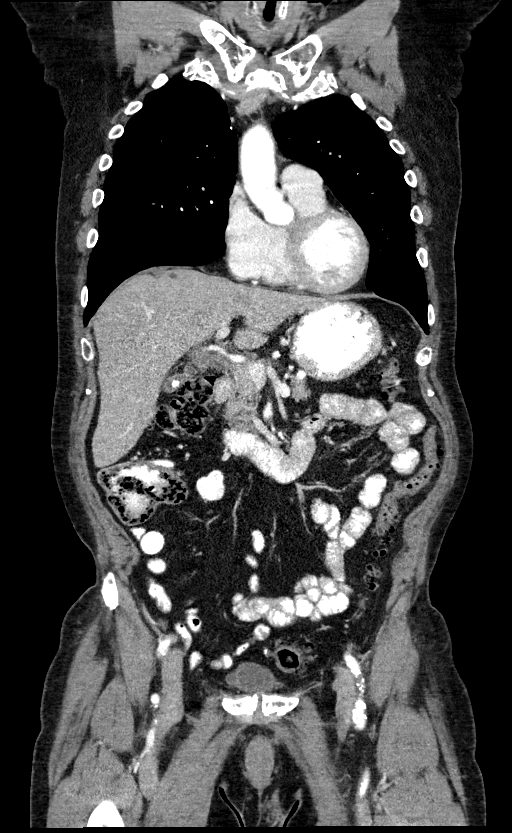
[im 95/158  mediastinal]
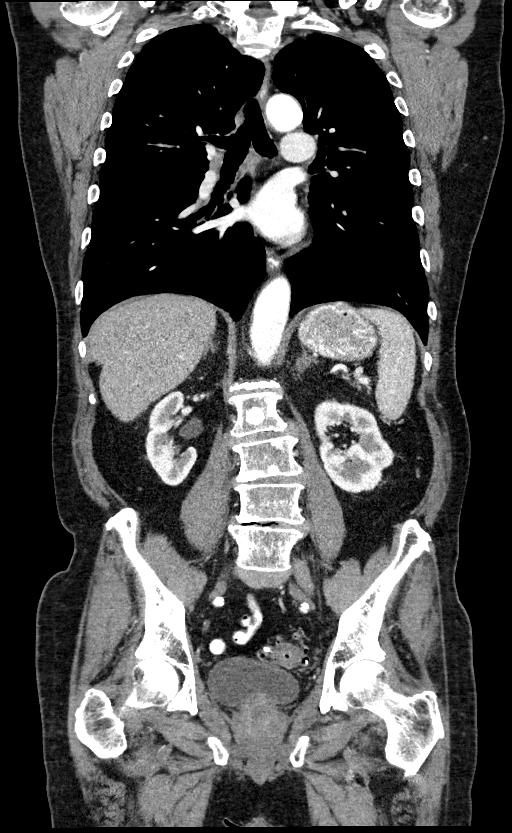

[12 of 36 positions shown; findings below may reference images not displayed]

RADIATION DOSE REDUCTION: This exam was performed according to the
departmental dose-optimization program which includes automated
exposure control, adjustment of the mA and/or kV according to
patient size and/or use of iterative reconstruction technique.

CONTRAST:  100mL OMNIPAQUE IOHEXOL 300 MG/ML  SOLN
FINDINGS: CT CHEST FINDINGS

Cardiovascular: The heart size is normal. No substantial pericardial
effusion. Coronary artery calcification is evident. Mild
atherosclerotic calcification is noted in the wall of the thoracic
aorta.

Mediastinum/Nodes: No mediastinal lymphadenopathy. There is no hilar
lymphadenopathy. The esophagus has normal imaging features. There is
no axillary lymphadenopathy.

Lungs/Pleura: Centrilobular emphsyema noted. 3-4 mm anterior right
lung nodule on 83/4 is stable. 3 mm right middle lobe nodule on 86/4
is unchanged. No new suspicious pulmonary nodule or mass. No focal
airspace consolidation. No pleural effusion.

Musculoskeletal: Multiple sclerotic bone lesions again noted
including right clavicle, thoracic spine, in bilateral ribs. 15 mm
sclerotic T12 lesion is stable.

CT ABDOMEN PELVIS FINDINGS

Hepatobiliary: Small hypodensities in the dome of the liver show no
substantial change, including 10 mm hypodensity on 53/2. No new
suspicious abnormality within the liver parenchyma. 6 mm calcified
gallstone evident. No intrahepatic or extrahepatic biliary dilation.

Pancreas: No focal mass lesion. No dilatation of the main duct. No
intraparenchymal cyst. No peripancreatic edema.

Spleen: No splenomegaly. No focal mass lesion.

Adrenals/Urinary Tract: Stable appearance of bilateral adrenal
thickening 4 mm nonobstructing stone noted lower pole right kidney
punctate nonobstructing stone seen interpolar left kidney. Renal
cortical scarring evident upper pole right kidney. No evidence for
hydroureter. Possible small anterior bladder diverticuli.

Stomach/Bowel: Stomach is moderately distended with contrast.
Duodenum is normally positioned as is the ligament of Treitz. No
small bowel wall thickening. No small bowel dilatation. The terminal
ileum is normal. The appendix is normal. No gross colonic mass. No
colonic wall thickening. Diverticular changes are noted in the left
colon without evidence of diverticulitis.

Vascular/Lymphatic: There is moderate atherosclerotic calcification
of the abdominal aorta without aneurysm. Infrarenal abdominal aorta
measures 3.2 cm diameter, no substantial change. 1.7 cm short axis
porta hepatis lymph node identified on the previous study is 1.7 cm
short axis today (62/2). A second adjacent porta hepatis node
measured previously at 1.5 cm short axis is now 1.3 cm short axis on
65/2. No retroperitoneal lymphadenopathy. No pelvic sidewall
lymphadenopathy. Upper normal lymph node in the right groin region
is probably reactive given interval stability.

Reproductive: The prostate gland and seminal vesicles are
unremarkable.

Other: No intraperitoneal free fluid.

Musculoskeletal: Stable appearance of multiple sclerotic bone
lesions.
IMPRESSION: 1. No new or progressive findings on today's exam.
2. Stable appearance of porta hepatis lymph lymphadenopathy. These
were noted to be non hypermetabolic on PET-CT [DATE].
3. Stable appearance of multiple sclerotic bone lesions also non
hypermetabolic on recent PET-CT and compatible with treated
metastatic disease.
4. Cholelithiasis.
5. Bilateral nonobstructing nephrolithiasis.
6. Left colonic diverticulosis without diverticulitis.
7. 3.2 cm diameter infrarenal abdominal aortic aneurysm. Continued
attention on restaging exams recommended or follow-up ultrasound
every 3 years. This recommendation follows ACR consensus guidelines:
White Paper of the ACR Incidental Findings Committee II on Vascular
Findings. [HOSPITAL] [NI]; [DATE].
8. Aortic Atherosclerosis ([NI]-[NI]) and Emphysema ([NI]-[NI]).

## 2021-07-29 MED ORDER — IOHEXOL 300 MG/ML  SOLN
100.0000 mL | Freq: Once | INTRAMUSCULAR | Status: AC | PRN
  Administered 2021-07-29: 100 mL via INTRAVENOUS

## 2021-07-31 ENCOUNTER — Encounter: Payer: Self-pay | Admitting: Internal Medicine

## 2021-07-31 ENCOUNTER — Inpatient Hospital Stay: Payer: PPO | Attending: Internal Medicine

## 2021-07-31 ENCOUNTER — Other Ambulatory Visit: Payer: Self-pay | Admitting: Pharmacist

## 2021-07-31 ENCOUNTER — Inpatient Hospital Stay: Payer: PPO

## 2021-07-31 ENCOUNTER — Inpatient Hospital Stay: Payer: PPO | Admitting: Internal Medicine

## 2021-07-31 DIAGNOSIS — C3411 Malignant neoplasm of upper lobe, right bronchus or lung: Secondary | ICD-10-CM | POA: Insufficient documentation

## 2021-07-31 DIAGNOSIS — C7951 Secondary malignant neoplasm of bone: Secondary | ICD-10-CM

## 2021-07-31 DIAGNOSIS — C7931 Secondary malignant neoplasm of brain: Secondary | ICD-10-CM | POA: Diagnosis not present

## 2021-07-31 DIAGNOSIS — C787 Secondary malignant neoplasm of liver and intrahepatic bile duct: Secondary | ICD-10-CM | POA: Diagnosis not present

## 2021-07-31 DIAGNOSIS — Z7982 Long term (current) use of aspirin: Secondary | ICD-10-CM | POA: Diagnosis not present

## 2021-07-31 DIAGNOSIS — C7972 Secondary malignant neoplasm of left adrenal gland: Secondary | ICD-10-CM | POA: Diagnosis not present

## 2021-07-31 DIAGNOSIS — Z79899 Other long term (current) drug therapy: Secondary | ICD-10-CM | POA: Diagnosis not present

## 2021-07-31 DIAGNOSIS — Z87891 Personal history of nicotine dependence: Secondary | ICD-10-CM | POA: Insufficient documentation

## 2021-07-31 LAB — CBC WITH DIFFERENTIAL/PLATELET
Abs Immature Granulocytes: 0.02 10*3/uL (ref 0.00–0.07)
Basophils Absolute: 0.1 10*3/uL (ref 0.0–0.1)
Basophils Relative: 1 %
Eosinophils Absolute: 0.3 10*3/uL (ref 0.0–0.5)
Eosinophils Relative: 5 %
HCT: 44.8 % (ref 39.0–52.0)
Hemoglobin: 14.8 g/dL (ref 13.0–17.0)
Immature Granulocytes: 0 %
Lymphocytes Relative: 23 %
Lymphs Abs: 1.5 10*3/uL (ref 0.7–4.0)
MCH: 32.3 pg (ref 26.0–34.0)
MCHC: 33 g/dL (ref 30.0–36.0)
MCV: 97.8 fL (ref 80.0–100.0)
Monocytes Absolute: 0.6 10*3/uL (ref 0.1–1.0)
Monocytes Relative: 9 %
Neutro Abs: 3.9 10*3/uL (ref 1.7–7.7)
Neutrophils Relative %: 62 %
Platelets: 151 10*3/uL (ref 150–400)
RBC: 4.58 MIL/uL (ref 4.22–5.81)
RDW: 14.7 % (ref 11.5–15.5)
WBC: 6.2 10*3/uL (ref 4.0–10.5)
nRBC: 0 % (ref 0.0–0.2)

## 2021-07-31 LAB — COMPREHENSIVE METABOLIC PANEL
ALT: 30 U/L (ref 0–44)
AST: 34 U/L (ref 15–41)
Albumin: 3.9 g/dL (ref 3.5–5.0)
Alkaline Phosphatase: 40 U/L (ref 38–126)
Anion gap: 7 (ref 5–15)
BUN: 19 mg/dL (ref 8–23)
CO2: 29 mmol/L (ref 22–32)
Calcium: 9.4 mg/dL (ref 8.9–10.3)
Chloride: 102 mmol/L (ref 98–111)
Creatinine, Ser: 1.26 mg/dL — ABNORMAL HIGH (ref 0.61–1.24)
GFR, Estimated: 59 mL/min — ABNORMAL LOW (ref >60)
Glucose, Bld: 122 mg/dL — ABNORMAL HIGH (ref 70–99)
Potassium: 4.8 mmol/L (ref 3.5–5.1)
Sodium: 138 mmol/L (ref 135–145)
Total Bilirubin: 0.7 mg/dL (ref 0.3–1.2)
Total Protein: 7.2 g/dL (ref 6.5–8.1)

## 2021-07-31 MED ORDER — ZOLEDRONIC ACID 4 MG/100ML IV SOLN
4.0000 mg | Freq: Once | INTRAVENOUS | Status: AC
  Administered 2021-07-31: 4 mg via INTRAVENOUS

## 2021-07-31 MED ORDER — SODIUM CHLORIDE 0.9 % IV SOLN
Freq: Once | INTRAVENOUS | Status: AC
  Filled 2021-07-31: qty 250

## 2021-07-31 MED ORDER — BINIMETINIB 15 MG PO TABS: 45.0000 mg | ORAL_TABLET | Freq: Two times a day (BID) | ORAL | 2 refills | Status: DC

## 2021-07-31 MED ORDER — ZOLEDRONIC ACID 4 MG/100ML IV SOLN
4.0000 mg | Freq: Once | INTRAVENOUS | Status: DC
  Filled 2021-07-31: qty 100

## 2021-07-31 MED ORDER — ENCORAFENIB 75 MG PO CAPS: 450.0000 mg | ORAL_CAPSULE | Freq: Every day | ORAL | 2 refills | Status: DC

## 2021-07-31 NOTE — Progress Notes (Signed)
[place] ?CONSULT NOTE ? ?Patient Care Team: ?[name], MD as PCP - General (Family Medicine) ?[name], MD as Consulting Physician (Family Medicine) ?Robert Bellow, MD (General Surgery) ?Thornton Park, DO as Consulting Physician (Radiation Oncology) ? ?CHIEF COMPLAINTS/PURPOSE OF CONSULTATION: Lung cancer ? ?#  ?Oncology History Overview Note  ?# OCT 2018- STAGE I- A. LUNG, RIGHT UPPER LOBE; WEDGE RESECTION: - INVASIVE ADENOCARCINOMA, 1.0 CM, SOLID PREDOMINANT (50% SOLID, 25%  ?LEPIDIC, 20% ACINAR 5% PAPILLARY).  ?- PARENCHYMAL MARGIN APPEARS CLEAR; TUMOR WAS 1 CM FROM MARGIN BEFORE REMOVAL OF STAPLE LINE.; NO adjuvant therapy.  ? ?# DEC 2021-- METASTATIC CARCINOMA, COMPATIBLE WITH PULMONARY ADENOCARCINOMA [left shoulder Core Biopsy];  ?NOV 24th PET-right hilar and Hypermetabolic mediastinal lymphadenopathy; small pulmonary nodules; 2 metastatic lesions in the liver/  pancreatic head/left adrenal gland;  muscle and skeletal metastasis.  Subcutaneous nodule-positive for metastatic adeno lung.  ? ?# DEC 2021-Brain MRI multiple subcentimeter enhancing brain mets no significant edema - NO SYMPTOMS;s/p RT eval [Dr.Blackburn/Crystal-s/p WBRT- JAN 11th, 2022] ? ?# JAN 14th, 2021-START DABRFENIB +MEKINIST [name] 2022- MUGA scan-EF-58%]; STOPPED MID OCT 2022- sec to acute pancreatitis ? ?# NOV 7th, 2022- Enco+Bini.  ? 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 ?# #Atrial arrhythmia-postoperatively.  Short course of Eliquis.smoking-quit 2008.  ?---------------------------------------   ? ?DIAGNOSIS: LUNG CA ? ?STAGE:  IV      ;GOALS:palliation ? ? ?  ?Primary cancer of right upper lobe of lung ([place])  ?04/09/2020 Cancer Staging  ? Staging form: Lung, AJCC 8th Edition ?- Clinical: Stage IVB (cN1, cM1c) - Signed by Cammie Sickle, MD on 04/09/2020 ? ?  ? ? ? ?HISTORY OF PRESENTING ILLNESS: Ambulating by independently.  Accompanied by his  wife.   ? ?Curtis Andrade. 76 y.o.  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 ? ?In the interim patient had been evaluated by neurology; Keppra has been discontinued.  Is currently on Vimpat.  Tolerating much better.  Much improved cognition.  Fatigue improved.  No headaches.  No seizures. ? ?No nausea vomiting.  Denies any abdominal pain ? ?Review of Systems  ?Constitutional:  Positive for malaise/fatigue. Negative for chills, diaphoresis, fever and weight loss.  ?HENT:  Negative for nosebleeds and sore throat.   ?Eyes:  Negative for double vision.  ?Respiratory:  Negative for cough, hemoptysis, sputum production, shortness of breath and wheezing.   ?Cardiovascular:  Negative for chest pain, palpitations, orthopnea and leg swelling.  ?Gastrointestinal:  Negative for abdominal pain, blood in stool, constipation, diarrhea, heartburn, melena, nausea and vomiting.  ?Genitourinary:  Negative for dysuria, frequency and urgency.  ?Musculoskeletal:  Positive for back pain and joint pain.  ?Skin: Negative.  Negative for itching and rash.  ?Neurological:  Positive for weakness. Negative for dizziness, tingling, focal weakness and headaches.  ?Endo/Heme/Allergies:  Does not bruise/bleed easily.  ?Psychiatric/Behavioral:  Positive for memory loss. Negative for depression. The patient is not nervous/anxious and does not have insomnia.   ? ? ?MEDICAL HISTORY:  ?Past Medical History:  ?Diagnosis Date  ? Cataract cortical, senile 11/27/2016  ? Colon polyp 2013  ? Hemorrhoids   ? pt denies  ? Hiccoughs   ? 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 Overview:  2010 Stage I, T1, NO, [place] squamous cell carcinoma treated with  radiation therapy, followed by Dr. Baruch Gouty and Dr. Nadeen Landau  ? Phlebitis 1998  ? Primary cancer of right upper lobe of lung ([place]) 01/14/2017  ? Dr. Genevive Bi  performed a wedge resection of RUL.   ? 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/27/2016  ? ? ?SURGICAL HISTORY: ?Past Surgical History:  ?Procedure Laterality Date  ? COLONOSCOPY  2013  ? Dr Vira Agar  ? COLONOSCOPY WITH PROPOFOL N/A 12/23/2016  ? Procedure: COLONOSCOPY WITH PROPOFOL;  Surgeon: Robert Bellow, MD;  Location: [facility] ENDOSCOPY;  Service: Endoscopy;  Laterality: N/A;  ? EYE SURGERY    ? HERNIA REPAIR Right 1990's  ? TENDON REPAIR Left 03/14/2019  ? Procedure: TENDON REPAIR AND GRAFT  TRANSFER EXTENSOR INDICIS PROPRIUS LEFT THUMB;  Surgeon: [name], MD;  Location: [place];  Service: Orthopedics;  Laterality: Left;  AXILLARY BLOCK  ? THORACOTOMY Right 01/14/2017  ? Procedure: RIGHT THORACOSCOPYWITH WIDE WEDGE RESECTION,  PREOP BROCHOSCOPY;  Surgeon: [name], MD;  Location: [facility] ORS;  Service: General;  Laterality: Right;  ? THROAT SURGERY  2010  ? ? ?SOCIAL HISTORY: ?Social History  ? ?Socioeconomic History  ? Marital status: Married  ?  Spouse name: Not on file  ? Number of children: Not on file  ? Years of education: Not on file  ? Highest education level: Not on file  ?Occupational History  ? Not on file  ?Tobacco Use  ? Smoking status: Former  ?  Years: 50.00  ?  Types: Cigarettes  ?  Quit date: 03/31/2007  ?  Years since quitting: 14.3  ? Smokeless tobacco: Never  ?Vaping Use  ? Vaping Use: Never used  ?Substance and Sexual Activity  ? Alcohol use: No  ? Drug use: No  ? Sexual activity: Yes  ?  Birth control/protection: None  ?  Comment: Married  ?Other Topics Concern  ? Not on file  ?Social History Narrative  ? Not on file  ? ?Social Determinants of Health  ? ?Financial Resource Strain: Not on file  ?Food Insecurity: Not on file  ?Transportation Needs: Not on file  ?Physical Activity: Not on file  ?Stress: Not on file  ?Social Connections: Not on file  ?Intimate Partner Violence: Not on file  ? ? ?FAMILY HISTORY: ?Family History  ?Problem Relation Age of Onset  ? Alzheimer's disease  Mother   ? Alzheimer's disease Father   ? Stroke Father   ? Colon cancer Neg Hx   ? ? ?ALLERGIES:  has No Known Allergies. ? ?MEDICATIONS:  ?Current Outpatient Medications  ?Medication Sig Dispense Refill  ? Ascorbic Acid (VITAMIN C) 1000 MG tablet Take 1,000 mg by mouth daily.    ? aspirin EC 81 MG tablet Take 81 mg by mouth daily.    ? Calcium Carbonate-Vit D-Min (CALCIUM 1200 PO) Take 1 tablet by mouth daily.    ? folic acid (FOLVITE) 630 MCG tablet Take 800 mcg by mouth daily.    ? lamoTRIgine (LAMICTAL) 25 MG tablet Take 2 tablets (50 mg total) by mouth daily. 60 tablet 3  ? levETIRAcetam (KEPPRA) 500 MG tablet Take 1 tablet (500 mg total) by mouth 2 (two) times daily. 60 tablet 3  ? Multiple Vitamin (MULTIVITAMIN WITH MINERALS) TABS tablet Take 1 tablet by mouth daily. Senior Multivitamin.    ? Multiple Vitamins-Minerals (EMERGEN-C IMMUNE PO) Take 1 tablet by mouth 2 (two) times daily.    ? Omega-3 1000 MG CAPS Take 1 tablet by mouth 1 day or 1 dose.    ?  omeprazole (PRILOSEC) 40 MG capsule Take by mouth.    ? saw palmetto 80 MG capsule Take 80 mg by mouth daily.    ? binimetinib (MEKTOVI) 15 MG tablet Take 3 tablets (45 mg total) by mouth 2 (two) times daily. 180 tablet 2  ? encorafenib (BRAFTOVI) 75 MG capsule Take 6 capsules (450 mg total) by mouth daily. 180 capsule 2  ? ?No current facility-administered medications for this visit.  ? ? ?  ?. ? ?PHYSICAL EXAMINATION: ?ECOG PERFORMANCE STATUS: 0 - Asymptomatic ? ?Vitals:  ? 07/31/21 1100  ?BP: 128/73  ?Pulse: 79  ?Resp: 16  ?Temp: 97.9 ?F (36.6 ?C)  ?SpO2: 97%  ? ? ?Filed Weights  ? 07/31/21 1100  ?Weight: 204 lb 6.4 oz (92.7 kg)  ? ? ? ?Physical Exam ?HENT:  ?   Head: Normocephalic and atraumatic.  ?   Mouth/Throat:  ?   Pharynx: No oropharyngeal exudate.  ?Eyes:  ?   Pupils: Pupils are equal, round, and reactive to light.  ?Cardiovascular:  ?   Rate and Rhythm: Normal rate and regular rhythm.  ?Pulmonary:  ?   Effort: Pulmonary effort is normal. No  respiratory distress.  ?   Breath sounds: Normal breath sounds. No wheezing.  ?Abdominal:  ?   General: Bowel sounds are normal. There is no distension.  ?   Palpations: Abdomen is soft. There is no mass.  ?   Te

## 2021-07-31 NOTE — Patient Instructions (Signed)
Zoledronic Acid Injection (Cancer) ?What is this medication? ?ZOLEDRONIC ACID (ZOE le dron ik AS id) treats high calcium levels in the blood caused by cancer. It may also be used with chemotherapy to treat weakened bones caused by cancer. It works by slowing down the release of calcium from bones. This lowers calcium levels in your blood. It also makes your bones stronger and less likely to break (fracture). It belongs to a group of medications called bisphosphonates. ?This medicine may be used for other purposes; ask your health care provider or pharmacist if you have questions. ?COMMON BRAND NAME(S): Zometa, Zometa Powder ?What should I tell my care team before I take this medication? ?They need to know if you have any of these conditions: ?Dehydration ?Dental disease ?Kidney disease ?Liver disease ?Low levels of calcium in the blood ?Lung or breathing disease, such as asthma ?Receiving steroids, such as dexamethasone or prednisone ?An unusual or allergic reaction to zoledronic acid, other medications, foods, dyes, or preservatives ?Pregnant or trying to get pregnant ?Breast-feeding ?How should I use this medication? ?This medication is injected into a vein. It is given by your care team in a hospital or clinic setting. ?Talk to your care team about the use of this medication in children. Special care may be needed. ?Overdosage: If you think you have taken too much of this medicine contact a poison control center or emergency room at once. ?NOTE: This medicine is only for you. Do not share this medicine with others. ?What if I miss a dose? ?Keep appointments for follow-up doses. It is important not to miss your dose. Call your care team if you are unable to keep an appointment. ?What may interact with this medication? ?Certain antibiotics given by injection ?Diuretics, such as bumetanide, furosemide ?NSAIDs, medications for pain and inflammation, such as ibuprofen or naproxen ?Teriparatide ?Thalidomide ?This list  may not describe all possible interactions. Give your health care provider a list of all the medicines, herbs, non-prescription drugs, or dietary supplements you use. Also tell them if you smoke, drink alcohol, or use illegal drugs. Some items may interact with your medicine. ?What should I watch for while using this medication? ?Visit your care team for regular checks on your progress. It may be some time before you see the benefit from this medication. ?Some people who take this medication have severe bone, joint, or muscle pain. This medication may also increase your risk for jaw problems or a broken thigh bone. Tell your care team right away if you have severe pain in your jaw, bones, joints, or muscles. Tell you care team if you have any pain that does not go away or that gets worse. ?Tell your dentist and dental surgeon that you are taking this medication. You should not have major dental surgery while on this medication. See your dentist to have a dental exam and fix any dental problems before starting this medication. Take good care of your teeth while on this medication. Make sure you see your dentist for regular follow-up appointments. ?You should make sure you get enough calcium and vitamin D while you are taking this medication. Discuss the foods you eat and the vitamins you take with your care team. ?Check with your care team if you have severe diarrhea, nausea, and vomiting, or if you sweat a lot. The loss of too much body fluid may make it dangerous for you to take this medication. ?You may need bloodwork while taking this medication. ?Talk to your care team if  you wish to become pregnant or think you might be pregnant. This medication can cause serious birth defects. ?What side effects may I notice from receiving this medication? ?Side effects that you should report to your care team as soon as possible: ?Allergic reactions--skin rash, itching, hives, swelling of the face, lips, tongue, or  throat ?Kidney injury--decrease in the amount of urine, swelling of the ankles, hands, or feet ?Low calcium level--muscle pain or cramps, confusion, tingling, or numbness in the hands or feet ?Osteonecrosis of the jaw--pain, swelling, or redness in the mouth, numbness of the jaw, poor healing after dental work, unusual discharge from the mouth, visible bones in the mouth ?Severe bone, joint, or muscle pain ?Side effects that usually do not require medical attention (report to your care team if they continue or are bothersome): ?Constipation ?Fatigue ?Fever ?Loss of appetite ?Nausea ?Stomach pain ?This list may not describe all possible side effects. Call your doctor for medical advice about side effects. You may report side effects to FDA at 1-800-FDA-1088. ?Where should I keep my medication? ?This medication is given in a hospital or clinic. It will not be stored at home. ?NOTE: This sheet is a summary. It may not cover all possible information. If you have questions about this medicine, talk to your doctor, pharmacist, or health care provider. ?? 2023 [employer] Standard (2021-05-09 00:00:00) ? ?

## 2021-07-31 NOTE — Assessment & Plan Note (Addendum)
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 ? ?#For now continue Enco+Bini--continue current therapy.STABLE.  Tolerating well without any major side effects. ? ?#? Seizures-DEC 2022 [Dr.vaslow]--currently on Vimpat.  Tolerating well.  No side effects noted.  Pending MRI Aug 08, 2021; also awaiting appointment with Dr.Vaslow.  Stable. ? ?# right temple Silex s/p excision [Dr.Dasher]-monitor closely on B-raf inhibitors. ? ?# Brain metastases: s/p WBRT [dec 2021]; DEC 29th, 2022-interval improvement in numerous hemorrhagic metastases in the brain, which have decreased in conspicuity. No new lesions identified.  Stable.  See above. ? ?# Bone lesions-mildly symptomatic; continue Zometa every 4-6 weeks [30 mins; tylenol]. with infusion premedicated with Tylenol at home.;  Calcium normal today.  Proceed with Zometa.  Given patient's poor tolerance [myalgias/fatigue over 1 to 2 days postinfusion]-I think is reasonable to switch to every 2 months. ? ?#Incidental findings on Imaging  CT 2023: Cholelithiasis;  Bilateral nonobstructing nephrolithiasis; Left colonic diverticulosis without diverticulitis; 3.2 cm diameter infrarenal abdominal aortic aneurysm [ultrasound every 3 years];  Aortic Atherosclerosisand EmphysemaI reviewed/discussed/counseled the patient.  ? ? Zometa q 2 M- ?[mon/-wed appt] ? ? DISPOSITION:  ?# Zometa today- 30 mins  ?# follow up 4 weeks-; MD; labs- cbc/cmp;Dr.B ? ? ? ?

## 2021-07-31 NOTE — Progress Notes (Signed)
Patient denies new problems/concerns today.   °

## 2021-08-08 ENCOUNTER — Ambulatory Visit
Admission: RE | Admit: 2021-08-08 | Discharge: 2021-08-08 | Disposition: A | Discharge status: Home / Self Care | Payer: PPO | Source: Ambulatory Visit | Attending: Internal Medicine | Admitting: Internal Medicine

## 2021-08-08 DIAGNOSIS — C7931 Secondary malignant neoplasm of brain: Secondary | ICD-10-CM | POA: Diagnosis not present

## 2021-08-08 DIAGNOSIS — G319 Degenerative disease of nervous system, unspecified: Secondary | ICD-10-CM | POA: Diagnosis not present

## 2021-08-08 IMAGING — MR MR HEAD WO/W CM
14 series · 48 of 48 positions shown · IV contrast (9ml Gadavist)
Comparison: [DATE]

CLINICAL DATA: Lung cancer follow-up

EXAM:
MRI HEAD WITHOUT AND WITH CONTRAST
TECHNIQUE: Multiplanar, multiecho pulse sequences of the brain and surrounding
structures were obtained without and with intravenous contrast.
CONTRAST:  9mL GADAVIST GADOBUTROL 1 MMOL/ML IV SOLN

[Series 5: ax dwi_tracew · axial · 3.0mm · 0.65mm/px · z∈[-75,+80]mm · 3 of 48 slices shown]
[im 1/48]
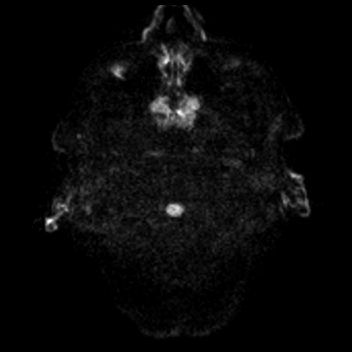
[im 24/48]
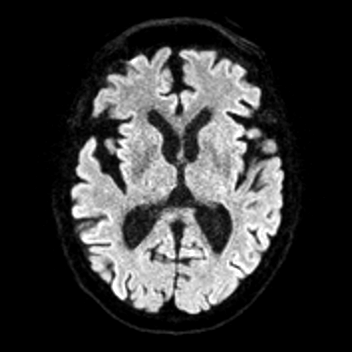
[im 48/48]
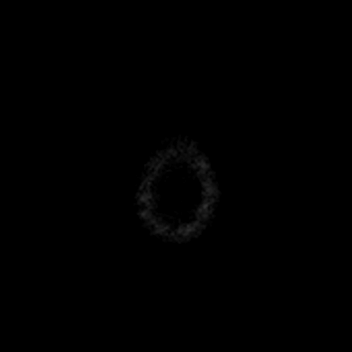

[Series 6: ax dwi_adc · axial · 3.0mm · 0.65mm/px · z∈[-75,+80]mm · 3 of 48 slices shown]
[im 1/48]
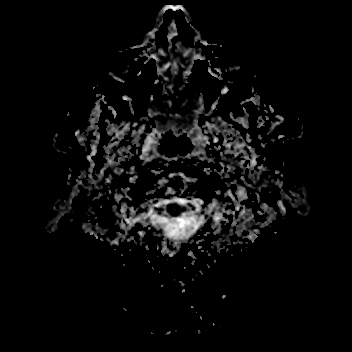
[im 24/48]
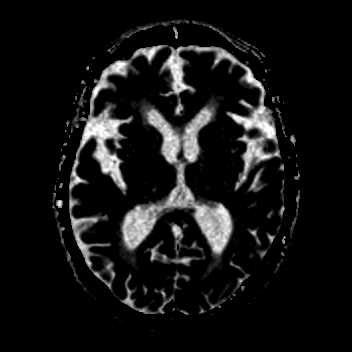
[im 48/48]
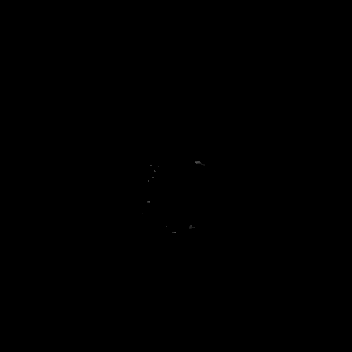

[Series 7: cor dwi_tracew · coronal · 5.0mm · 1.80mm/px · 2 of 42 slices shown]
[im 1/42]
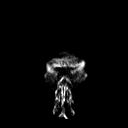
[im 42/42]
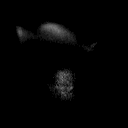

[Series 8: cor dwi_adc · coronal · 5.0mm · 1.80mm/px · 2 of 42 slices shown]
[im 1/42]
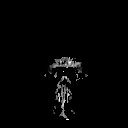
[im 42/42]
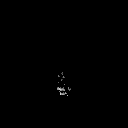

[Series 9: T1 · sagittal · 5.0mm · 0.62mm/px · 1 of 23 slices shown (1 of 2)]
[im 1/23]
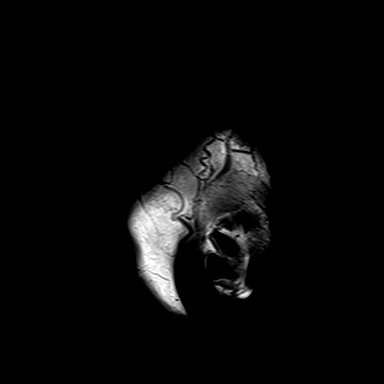

[Series 10: T2 · axial · 5.0mm · 0.55mm/px · z∈[-84,+83]mm · 2 of 29 slices shown]
[im 1/29]
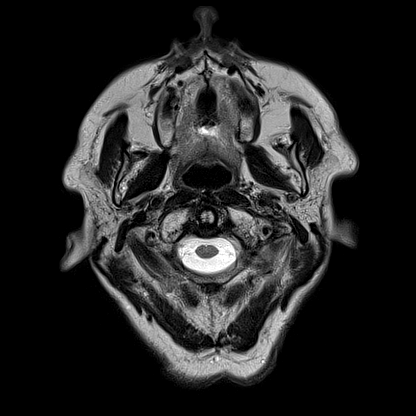
[im 29/29]
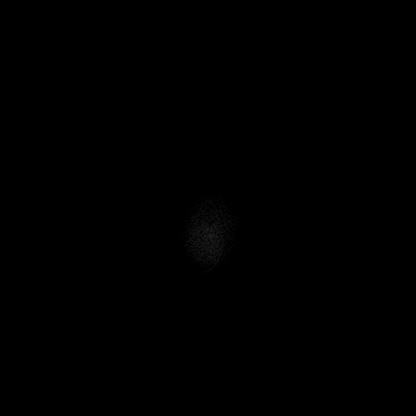

[Series 12: pha_images · axial · 3.0mm · 0.90mm/px · z∈[-82,+83]mm · 3 of 56 slices shown]
[im 1/56]
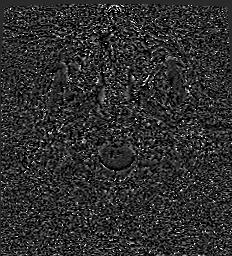
[im 28/56]
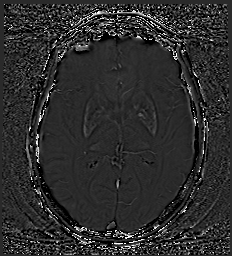
[im 56/56]
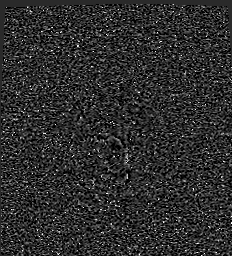

[Series 13: swi_images · axial · 3.0mm · 0.90mm/px · z∈[-82,+83]mm · 3 of 56 slices shown]
[im 1/56]
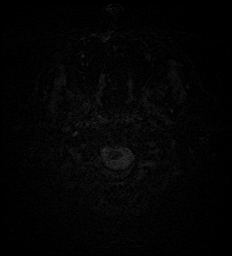
[im 28/56]
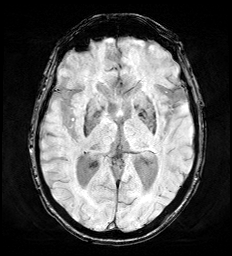
[im 56/56]
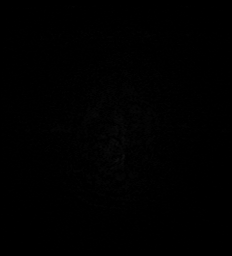

[Series 15: FLAIR · axial · 3.0mm · 0.55mm/px · z∈[-81,+80]mm · 3 of 55 slices shown]
[im 1/55]
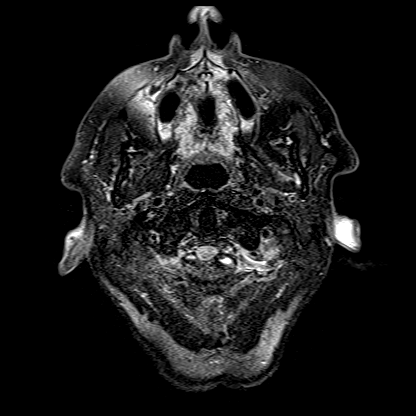
[im 28/55]
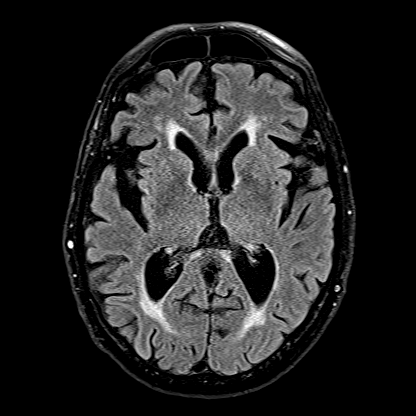
[im 55/55]
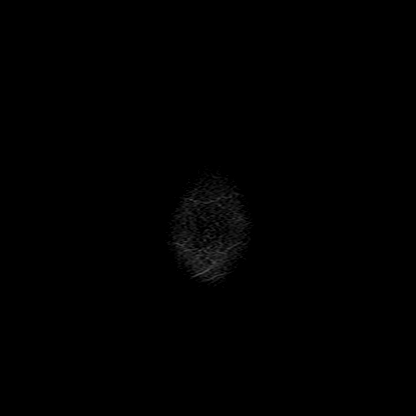

[Series 16: T1 · axial · 1.0mm · 0.98mm/px · z∈[-92,+97]mm · 10 of 187 slices shown (2 of 2)]
[im 1/187]
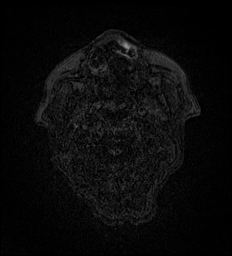
[im 21/187]
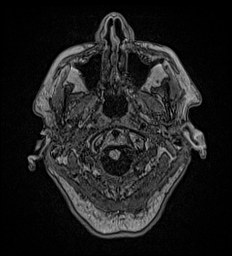
[im 42/187]
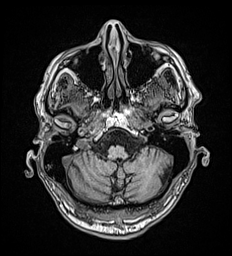
[im 63/187]
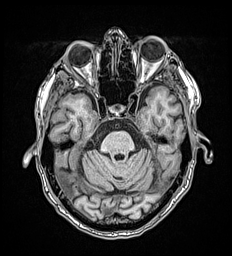
[im 83/187]
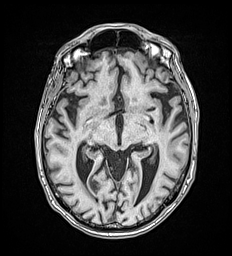
[im 104/187]
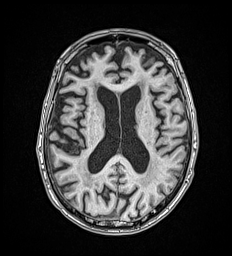
[im 125/187]
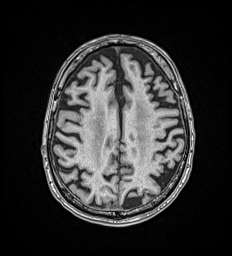
[im 145/187]
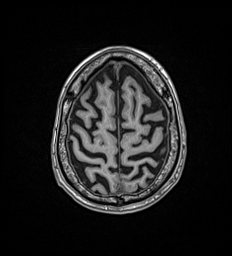
[im 166/187]
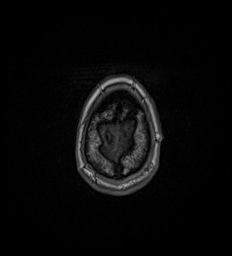
[im 187/187]
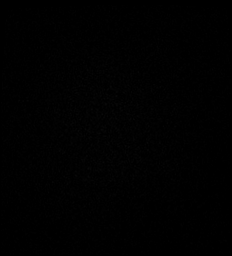

[Series 17: T2 post-contrast · coronal · 5.0mm · 0.57mm/px · 2 of 33 slices shown]
[im 1/33]
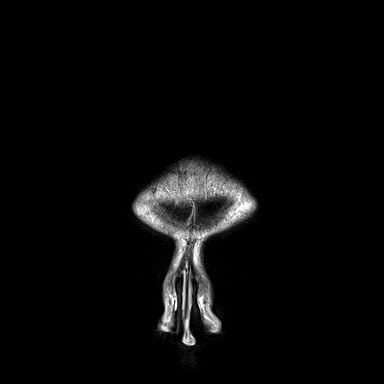
[im 33/33]
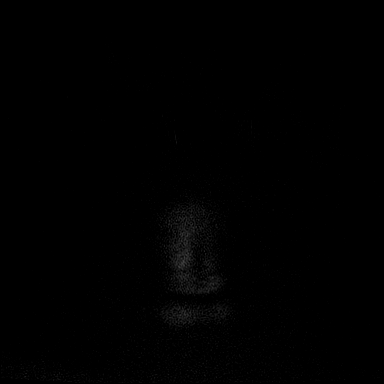

[Series 18: T1 post-contrast · axial · 1.0mm · 0.98mm/px · z∈[-92,+98]mm · 11 of 190 slices shown (1 of 3)]
[im 1/190]
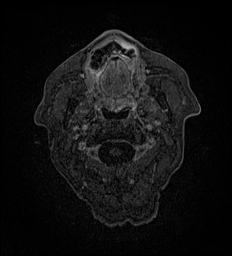
[im 19/190]
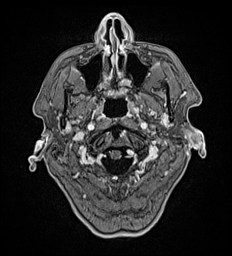
[im 38/190]
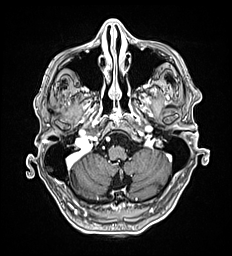
[im 57/190]
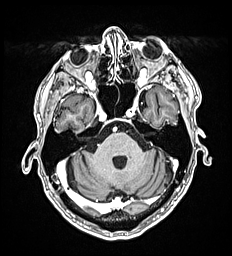
[im 76/190]
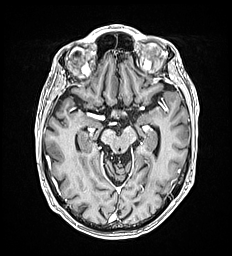
[im 95/190]
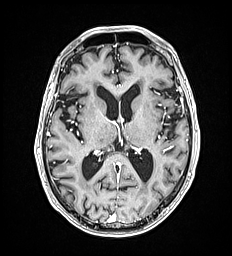
[im 114/190]
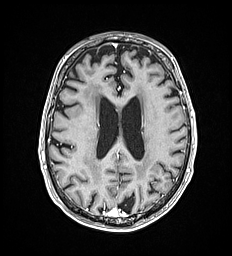
[im 133/190]
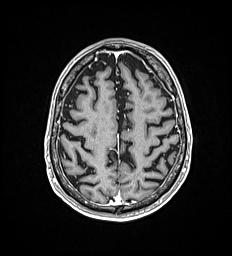
[im 152/190]
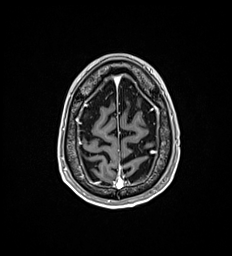
[im 171/190]
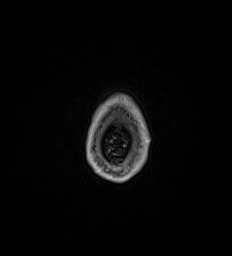
[im 190/190]
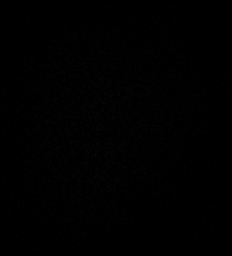

[Series 19: T1 post-contrast · coronal · 5.0mm · 0.57mm/px · 2 of 33 slices shown (2 of 3)]
[im 1/33]
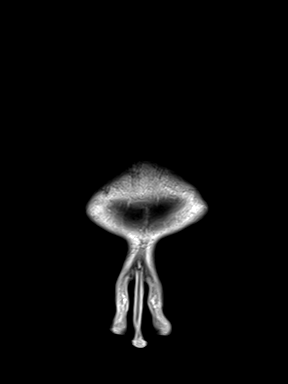
[im 33/33]
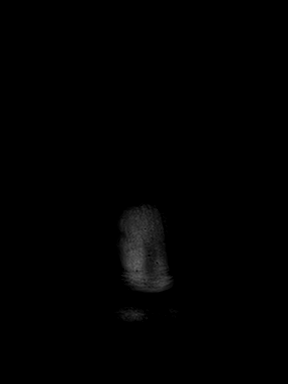

[Series 20: T1 post-contrast · sagittal · 5.0mm · 0.62mm/px · 1 of 23 slices shown (3 of 3)]
[im 1/23]
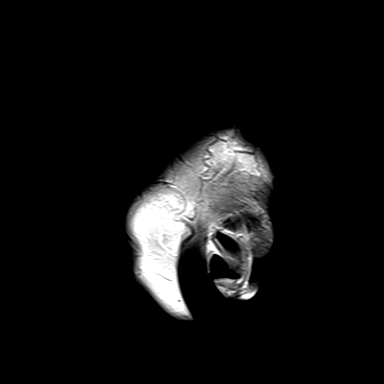

[48 of 48 positions shown; findings below may reference images not displayed]

FINDINGS: Brain: Stable small foci of hemosiderin deposition scattered in the
brain, with 2 left parietal nodules showing trace enhancement on
[DATE] and [DATE].

Generalized brain atrophy with periventricular chronic small vessel
ischemia. No hemorrhage, interval infarct, hydrocephalus, or
collection

Vascular: Normal flow voids and vascular enhancements

Skull and upper cervical spine: Normal marrow signal

Sinuses/Orbits: Trace right mastoid opacification. Bilateral
cataract resection.
IMPRESSION: Stable treated metastatic disease. No evidence of recurrent disease.

## 2021-08-08 MED ORDER — GADOBUTROL 1 MMOL/ML IV SOLN
9.0000 mL | Freq: Once | INTRAVENOUS | Status: AC | PRN
  Administered 2021-08-08: 9 mL via INTRAVENOUS

## 2021-08-11 ENCOUNTER — Inpatient Hospital Stay: Payer: PPO | Attending: Internal Medicine

## 2021-08-15 ENCOUNTER — Encounter: Payer: Self-pay | Admitting: Internal Medicine

## 2021-08-15 ENCOUNTER — Inpatient Hospital Stay: Payer: PPO | Admitting: Internal Medicine

## 2021-08-15 VITALS — BP 139/72 | HR 77 | Temp 96.1°F | Resp 16 | Wt 204.0 lb

## 2021-08-15 DIAGNOSIS — R569 Unspecified convulsions: Secondary | ICD-10-CM | POA: Diagnosis not present

## 2021-08-15 DIAGNOSIS — C7951 Secondary malignant neoplasm of bone: Secondary | ICD-10-CM | POA: Diagnosis not present

## 2021-08-15 DIAGNOSIS — C7931 Secondary malignant neoplasm of brain: Secondary | ICD-10-CM | POA: Diagnosis not present

## 2021-08-15 MED ORDER — LAMOTRIGINE 25 MG PO TABS: 50.0000 mg | ORAL_TABLET | Freq: Every day | ORAL | 1 refills | Status: DC

## 2021-08-15 NOTE — Progress Notes (Signed)
Pt in for follow up, "hopeful to get license back".  Pt denies any concerns today.

## 2021-08-15 NOTE — Progress Notes (Signed)
Curtis Andrade at [place], Curtis Andrade [id number] [phone]   Interval Evaluation  Date of Service: 08/15/21 Patient Name: Curtis Andrade. Patient MRN: [id number] Patient DOB: Jun 28, 1945 Provider: Ventura Sellers, MD  Identifying Statement:  Curtis Andrade. is a 76 y.o. male with Malignant neoplasm metastatic to brain [facility])  Focal seizure ([place])   Primary Cancer:  Oncologic History: Oncology History Overview Note  # OCT 2018- STAGE I- A. 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  - PARENCHYMAL MARGIN APPEARS CLEAR; TUMOR WAS 1 CM FROM MARGIN BEFORE REMOVAL OF STAPLE LINE.; NO adjuvant therapy.   # DEC 2021-- METASTATIC CARCINOMA, COMPATIBLE WITH PULMONARY ADENOCARCINOMA [left shoulder Core Biopsy];  NOV 24th PET-right hilar and Hypermetabolic mediastinal lymphadenopathy; small pulmonary nodules; 2 metastatic lesions in the liver/  pancreatic head/left adrenal gland;  muscle and skeletal metastasis.  Subcutaneous nodule-positive for metastatic adeno lung.   # DEC 2021-Brain MRI multiple subcentimeter enhancing brain mets no significant edema - NO SYMPTOMS;s/p RT eval [Dr.Blackburn/[place]-s/p WBRT- JAN 11th, 2022]  # JAN 14th, 2021-START DABRFENIB +MEKINIST Curtis Andrade 2022- MUGA scan-EF-58%]; STOPPED MID OCT 2022- sec to acute pancreatitis  # NOV 7th, 2022- Enco+Bini.   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  Short course of Eliquis.smoking-quit 2008.  ---------------------------------------    DIAGNOSIS: LUNG CA  STAGE:  IV      ;GOALS:palliation     Primary cancer of right upper lobe of lung ([place])  04/09/2020 Cancer Staging   Staging form: Lung, AJCC 8th Edition - Clinical: Stage IVB (cN1, cM1c) - Signed by Cammie Sickle, MD on  04/09/2020     CNS Oncologic History 04/09/21: Completes WBRT for 11+ visible metastases ([place])  Interval History: Curtis Andrade. Presents today for follow up after MRI brain.  No further seizures, tolerating lamictal well.  It has been 6 months since last event.  No other new or progressive changes, continues to follow with Dr. B for oral TKI.  H+P (04/11/21) Presents today to review recent neurologic episode.  His wife describes episode of sudden confusion, shaking, disorientation, roughly one month ago.  His "whole body was shaking", which he remembers, but shortly after that became confused and non-sensical, which improved slowly over several days.  He describes a period of almost 24 hours with "total blackout, no memory".  Since then, he has been back at his normal baseline, with no recurrence.  He completed whole brain radiation ~1 year ago with Dr. Donella Stade for brain metastases.  Continues on oral TKIs for BRAF+ lung cancer with Dr. Rogue Bussing.  Medications: Current Outpatient Medications on File Prior to Visit  Medication Sig Dispense Refill   Ascorbic Acid (VITAMIN C) 1000 MG tablet Take 1,000 mg by mouth daily.     aspirin EC 81 MG tablet Take 81 mg by mouth daily.     binimetinib (MEKTOVI) 15 MG tablet Take 3 tablets (45 mg total) by mouth 2 (two) times daily. 180 tablet 2   Calcium Carbonate-Vit D-Min (CALCIUM 1200 PO) Take 1 tablet by mouth daily.     encorafenib (BRAFTOVI) 75 MG capsule Take 6 capsules (450 mg total) by mouth daily. 093 capsule 2   folic acid (FOLVITE) 235 MCG tablet Take 800 mcg by mouth daily.     lamoTRIgine (  LAMICTAL) 25 MG tablet Take 2 tablets (50 mg total) by mouth daily. 60 tablet 3   levETIRAcetam (KEPPRA) 500 MG tablet Take 1 tablet (500 mg total) by mouth 2 (two) times daily. 60 tablet 3   Multiple Vitamin (MULTIVITAMIN WITH MINERALS) TABS tablet Take 1 tablet by mouth daily. Senior Multivitamin.     Multiple Vitamins-Minerals (EMERGEN-C IMMUNE  PO) Take 1 tablet by mouth 2 (two) times daily.     Omega-3 1000 MG CAPS Take 1 tablet by mouth 1 day or 1 dose.     omeprazole (PRILOSEC) 40 MG capsule Take by mouth.     saw palmetto 80 MG capsule Take 80 mg by mouth daily.     No current facility-administered medications on file prior to visit.    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 Baruch Gouty and Dr. Nadeen Landau   Phlebitis 1998   Primary cancer of right upper lobe of lung ([place]) 01/14/2017   Dr. Genevive Bi performed a wedge resection of RUL.    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/27/2016   Past Surgical History:  Past Surgical History:  Procedure Laterality Date   COLONOSCOPY  2013   Dr Vira Agar   COLONOSCOPY WITH PROPOFOL N/A 12/23/2016   Procedure: COLONOSCOPY WITH PROPOFOL;  Surgeon: Robert Bellow, MD;  Location[facility]COPY;  Service: Endoscopy;  Laterality: N/A;   EYE SURGERY     HERNIA REPAIR Right 1990's   TENDON REPAIR Left 03/14/2019   Procedure: TENDON REPAIR AND GRAFT  TRANSFER EXTENSOR INDICIS PROPRIUS LEFT THUMB;  Surgeon: [name], MD;  Location: [place];  Service: Orthopedics;  Laterality: Left;  AXILLARY BLOCK   THORACOTOMY Right 01/14/2017   Procedure: RIGHT THORACOSCOPYWITH WIDE WEDGE RESECTION,  PREOP BROCHOSCOPY;  Surgeon: [name], MD;  Location: [facility] ORS;  Service: General;  Laterality: Right;   THROAT SURGERY  2010   Social History:  Social History   Socioeconomic History   Marital status: Married    Spouse name: Not on file   Number of children: Not on file   Years of education: Not on file   Highest education level: Not on file  Occupational History   Not on file  Tobacco Use    Smoking status: Former    Years: 50.00    Types: Cigarettes    Quit date: 03/31/2007    Years since quitting: 14.3   Smokeless tobacco: Never  Vaping Use   Vaping Use: Never used  Substance and Sexual Activity   Alcohol use: No   Drug use: No   Sexual activity: Yes    Birth control/protection: None    Comment: Married  Other Topics Concern   Not on file  Social History Narrative   Not on file   Social Determinants of Health   Financial Resource Strain: Not on file  Food Insecurity: Not on file  Transportation Needs: Not on file  Physical Activity: Not on file  Stress: Not on file  Social Connections: Not on file  Intimate Partner Violence: Not on file   Family History:  Family History  Problem Relation Age of Onset   Alzheimer's disease Mother    Alzheimer's disease Father  Stroke Father    Colon cancer Neg Hx     Review of Systems: Constitutional: Doesn't report fevers, chills or abnormal weight loss Eyes: Doesn't report blurriness of vision Ears, nose, mouth, throat, and face: Doesn't report sore throat Respiratory: Doesn't report cough, dyspnea or wheezes Cardiovascular: Doesn't report palpitation, chest discomfort  Gastrointestinal:  Doesn't report nausea, constipation, diarrhea GU: Doesn't report incontinence Skin: Doesn't report skin rashes Neurological: Per HPI Musculoskeletal: Doesn't report joint pain Behavioral/Psych: Doesn't report anxiety  Physical Exam: Vitals:   08/15/21 1137  BP: 139/72  Pulse: 77  Resp: 16  Temp: (!) 96.1 F (35.6 C)  SpO2: 99%    KPS: 90. General: Alert, cooperative, pleasant, in no acute distress Head: Normal EENT: No conjunctival injection or scleral icterus.  Lungs: Resp effort normal Cardiac: Regular rate Abdomen: Non-distended abdomen Skin: No rashes cyanosis or petechiae. Extremities: No clubbing or edema  Neurologic Exam: Mental Status: Awake, alert, attentive to examiner. Oriented to self and  environment. Language is fluent with intact comprehension.  Cranial Nerves: Visual acuity is grossly normal. Visual fields are full. Extra-ocular movements intact. No ptosis. Face is symmetric Motor: Tone and bulk are normal. Power is full in both arms and legs. Reflexes are symmetric, no pathologic reflexes present.  Sensory: Intact to light touch Gait: Normal.   Labs: I have reviewed the data as listed    Component Value Date/Time   NA 138 07/31/2021 1047   K 4.8 07/31/2021 1047   CL 102 07/31/2021 1047   CO2 29 07/31/2021 1047   GLUCOSE 122 (H) 07/31/2021 1047   BUN 19 07/31/2021 1047   CREATININE 1.26 (H) 07/31/2021 1047   CALCIUM 9.4 07/31/2021 1047   PROT 7.2 07/31/2021 1047   ALBUMIN 3.9 07/31/2021 1047   AST 34 07/31/2021 1047   ALT 30 07/31/2021 1047   ALKPHOS 40 07/31/2021 1047   BILITOT 0.7 07/31/2021 1047   GFRNONAA 59 (L) 07/31/2021 1047   GFRAA >60 02/10/2019 1003   Lab Results  Component Value Date   WBC 6.2 07/31/2021   NEUTROABS 3.9 07/31/2021   HGB 14.8 07/31/2021   HCT 44.8 07/31/2021   MCV 97.8 07/31/2021   PLT 151 07/31/2021    Imaging:  [place] Clinician Interpretation: I have personally reviewed the CNS images as listed.  My interpretation, in the context of the patient's clinical presentation, is stable disease  MR BRAIN W WO CONTRAST  Result Date: 08/10/2021 CLINICAL DATA:  Lung cancer follow-up EXAM: MRI HEAD WITHOUT AND WITH CONTRAST TECHNIQUE: Multiplanar, multiecho pulse sequences of the brain and surrounding structures were obtained without and with intravenous contrast. CONTRAST:  26m GADAVIST GADOBUTROL 1 MMOL/ML IV SOLN COMPARISON:  03/27/2021 FINDINGS: Brain: Stable small foci of hemosiderin deposition scattered in the brain, with 2 left parietal nodules showing trace enhancement on 18:125 and 18:128. Generalized brain atrophy with periventricular chronic small vessel ischemia. No hemorrhage, interval infarct, hydrocephalus, or collection  Vascular: Normal flow voids and vascular enhancements Skull and upper cervical spine: Normal marrow signal Sinuses/Orbits: Trace right mastoid opacification. Bilateral cataract resection. IMPRESSION: Stable treated metastatic disease. No evidence of recurrent disease. Electronically Signed   By: JJorje GuildM.D.   On: 08/10/2021 04:01   CT CHEST ABDOMEN PELVIS W CONTRAST  Result Date: 07/30/2021 CLINICAL DATA:  Non-small-cell lung cancer. Restaging. * Tracking Code: BO * EXAM: CT CHEST, ABDOMEN, AND PELVIS WITH CONTRAST TECHNIQUE: Multidetector CT imaging of the chest, abdomen and pelvis was performed following the standard protocol during bolus  administration of intravenous contrast. RADIATION DOSE REDUCTION: This exam was performed according to the departmental dose-optimization program which includes automated exposure control, adjustment of the mA and/or kV according to patient size and/or use of iterative reconstruction technique. CONTRAST:  158m OMNIPAQUE IOHEXOL 300 MG/ML  SOLN COMPARISON:  PET-CT 04/30/2021.  Chest abdomen pelvis CT 04/15/2021 FINDINGS: CT CHEST FINDINGS Cardiovascular: The heart size is normal. No substantial pericardial effusion. Coronary artery calcification is evident. Mild atherosclerotic calcification is noted in the wall of the thoracic aorta. Mediastinum/Nodes: No mediastinal lymphadenopathy. There is no hilar lymphadenopathy. The esophagus has normal imaging features. There is no axillary lymphadenopathy. Lungs/Pleura: Centrilobular emphsyema noted. 3-4 mm anterior right lung nodule on 83/4 is stable. 3 mm right middle lobe nodule on 86/4 is unchanged. No new suspicious pulmonary nodule or mass. No focal airspace consolidation. No pleural effusion. Musculoskeletal: Multiple sclerotic bone lesions again noted including right clavicle, thoracic spine, in bilateral ribs. 15 mm sclerotic T12 lesion is stable. CT ABDOMEN PELVIS FINDINGS Hepatobiliary: Small hypodensities in the  dome of the liver show no substantial change, including 10 mm hypodensity on 53/2. No new suspicious abnormality within the liver parenchyma. 6 mm calcified gallstone evident. No intrahepatic or extrahepatic biliary dilation. Pancreas: No focal mass lesion. No dilatation of the main duct. No intraparenchymal cyst. No peripancreatic edema. Spleen: No splenomegaly. No focal mass lesion. Adrenals/Urinary Tract: Stable appearance of bilateral adrenal thickening 4 mm nonobstructing stone noted lower pole right kidney punctate nonobstructing stone seen interpolar left kidney. Renal cortical scarring evident upper pole right kidney. No evidence for hydroureter. Possible small anterior bladder diverticuli. Stomach/Bowel: Stomach is moderately distended with contrast. Duodenum is normally positioned as is the ligament of Treitz. No small bowel wall thickening. No small bowel dilatation. The terminal ileum is normal. The appendix is normal. No gross colonic mass. No colonic wall thickening. Diverticular changes are noted in the left colon without evidence of diverticulitis. Vascular/Lymphatic: There is moderate atherosclerotic calcification of the abdominal aorta without aneurysm. Infrarenal abdominal aorta measures 3.2 cm diameter, no substantial change. 1.7 cm short axis porta hepatis lymph node identified on the previous study is 1.7 cm short axis today (62/2). A second adjacent porta hepatis node measured previously at 1.5 cm short axis is now 1.3 cm short axis on 65/2. No retroperitoneal lymphadenopathy. No pelvic sidewall lymphadenopathy. Upper normal lymph node in the right groin region is probably reactive given interval stability. Reproductive: The prostate gland and seminal vesicles are unremarkable. Other: No intraperitoneal free fluid. Musculoskeletal: Stable appearance of multiple sclerotic bone lesions. IMPRESSION: 1. No new or progressive findings on today's exam. 2. Stable appearance of porta hepatis lymph  lymphadenopathy. These were noted to be non hypermetabolic on PET-CT 032/20/2542 3. Stable appearance of multiple sclerotic bone lesions also non hypermetabolic on recent PET-CT and compatible with treated metastatic disease. 4. Cholelithiasis. 5. Bilateral nonobstructing nephrolithiasis. 6. Left colonic diverticulosis without diverticulitis. 7. 3.2 cm diameter infrarenal abdominal aortic aneurysm. Continued attention on restaging exams recommended or follow-up ultrasound every 3 years. This recommendation follows ACR consensus guidelines: White Paper of the ACR Incidental Findings Committee II on Vascular Findings. J Am Coll Radiol 2013; 10:789-794. 8. Aortic Atherosclerosis (ICD10-I70.0) and Emphysema (ICD10-J43.9). Electronically Signed   By: EMisty StanleyM.D.   On: 07/30/2021 06:11    Assessment/Plan Malignant neoplasm metastatic to brain ([facility]  Focal seizure ([place]  Aldair B CClementeen Graham is clinically and radiographically stable today.  He will con't Lamictal 548mdaily for seizure prevention.  He is cleared to return to driving.   We appreciate the opportunity to participate in the care of Griffin..   We ask that [place]. return to clinic in 3 months following next brain MRI, or sooner as needed.  All questions were answered. The patient knows to call the clinic with any problems, questions or concerns. No barriers to learning were detected.  The total time spent in the encounter was 30 minutes and more than 50% was on counseling and review of test results   Ventura Sellers, MD Medical Director of Neuro-Oncology [facility] at [place] 08/15/21 11:29 AM

## 2021-08-29 ENCOUNTER — Inpatient Hospital Stay: Payer: PPO | Admitting: Internal Medicine

## 2021-08-29 ENCOUNTER — Inpatient Hospital Stay: Payer: PPO | Attending: Internal Medicine

## 2021-08-29 ENCOUNTER — Encounter: Payer: Self-pay | Admitting: Internal Medicine

## 2021-08-29 DIAGNOSIS — Z79899 Other long term (current) drug therapy: Secondary | ICD-10-CM | POA: Insufficient documentation

## 2021-08-29 DIAGNOSIS — C7800 Secondary malignant neoplasm of unspecified lung: Secondary | ICD-10-CM | POA: Insufficient documentation

## 2021-08-29 DIAGNOSIS — C7951 Secondary malignant neoplasm of bone: Secondary | ICD-10-CM | POA: Insufficient documentation

## 2021-08-29 DIAGNOSIS — C3411 Malignant neoplasm of upper lobe, right bronchus or lung: Secondary | ICD-10-CM

## 2021-08-29 DIAGNOSIS — Z923 Personal history of irradiation: Secondary | ICD-10-CM | POA: Insufficient documentation

## 2021-08-29 DIAGNOSIS — C7931 Secondary malignant neoplasm of brain: Secondary | ICD-10-CM | POA: Insufficient documentation

## 2021-08-29 DIAGNOSIS — C7972 Secondary malignant neoplasm of left adrenal gland: Secondary | ICD-10-CM | POA: Insufficient documentation

## 2021-08-29 DIAGNOSIS — C787 Secondary malignant neoplasm of liver and intrahepatic bile duct: Secondary | ICD-10-CM | POA: Diagnosis not present

## 2021-08-29 LAB — CBC WITH DIFFERENTIAL/PLATELET
Abs Immature Granulocytes: 0.01 10*3/uL (ref 0.00–0.07)
Basophils Absolute: 0.1 10*3/uL (ref 0.0–0.1)
Basophils Relative: 1 %
Eosinophils Absolute: 0.3 10*3/uL (ref 0.0–0.5)
Eosinophils Relative: 5 %
HCT: 43.7 % (ref 39.0–52.0)
Hemoglobin: 14.7 g/dL (ref 13.0–17.0)
Immature Granulocytes: 0 %
Lymphocytes Relative: 29 %
Lymphs Abs: 1.6 10*3/uL (ref 0.7–4.0)
MCH: 33.2 pg (ref 26.0–34.0)
MCHC: 33.6 g/dL (ref 30.0–36.0)
MCV: 98.6 fL (ref 80.0–100.0)
Monocytes Absolute: 0.6 10*3/uL (ref 0.1–1.0)
Monocytes Relative: 12 %
Neutro Abs: 2.9 10*3/uL (ref 1.7–7.7)
Neutrophils Relative %: 53 %
Platelets: 150 10*3/uL (ref 150–400)
RBC: 4.43 MIL/uL (ref 4.22–5.81)
RDW: 14.3 % (ref 11.5–15.5)
WBC: 5.5 10*3/uL (ref 4.0–10.5)
nRBC: 0 % (ref 0.0–0.2)

## 2021-08-29 LAB — COMPREHENSIVE METABOLIC PANEL
ALT: 23 U/L (ref 0–44)
AST: 28 U/L (ref 15–41)
Albumin: 3.8 g/dL (ref 3.5–5.0)
Alkaline Phosphatase: 35 U/L — ABNORMAL LOW (ref 38–126)
Anion gap: 7 (ref 5–15)
BUN: 18 mg/dL (ref 8–23)
CO2: 30 mmol/L (ref 22–32)
Calcium: 9.2 mg/dL (ref 8.9–10.3)
Chloride: 101 mmol/L (ref 98–111)
Creatinine, Ser: 1.23 mg/dL (ref 0.61–1.24)
GFR, Estimated: >60 mL/min (ref >60)
Glucose, Bld: 121 mg/dL — ABNORMAL HIGH (ref 70–99)
Potassium: 5.1 mmol/L (ref 3.5–5.1)
Sodium: 138 mmol/L (ref 135–145)
Total Bilirubin: 0.4 mg/dL (ref 0.3–1.2)
Total Protein: 7.6 g/dL (ref 6.5–8.1)

## 2021-08-29 NOTE — Assessment & Plan Note (Addendum)
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  #For now continue Enco+Bini--continue current therapy.STABLE.  Tolerating well without any major side effects.  #? Seizures-DEC 2022 [Dr.vaslow]--currently on Vimpat.  Tolerating well.  No side effects noted.   MRI Aug 08, 2021- STABLE s/p-Dr.Vaslow.  Stable.  # right temple [place] s/p excision [Dr.Dasher q6M]-monitor closely on B-raf inhibitors.  # Brain metastases: s/p WBRT [dec 2021]; DEC 29th, 2022-interval improvement in numerous hemorrhagic metastases in the brain, which have decreased in conspicuity. No new lesions identified.  Stable.  See above.  # Bone lesions-mildly symptomatic; continue Zometa every 4-6 weeks [30 mins; tylenol]. with infusion premedicated with Tylenol at home.;  Calcium normal today.  Proceed with Zometa.  Given patient's poor tolerance [myalgias/fatigue over 1 to 2 days postinfusion]-I think is reasonable to switch to every 2 months. STABLE.     Zometa q 2 M- next 7/2 [mon/-wed appt]   DISPOSITION:  # follow up 5 weeks-; MD; labs- cbc/cmp# Zometa today- 30 mins ;Dr.B

## 2021-08-29 NOTE — Progress Notes (Signed)
Pt in for follow up, denies any concerns today. 

## 2021-08-29 NOTE — Progress Notes (Signed)
[facility] NOTE  Patient Care Team: [name], MD as PCP - General (Family Medicine) [name], MD as Consulting Physician (Family Medicine) [name], [name], MD (General Surgery) Thornton Park, DO as Consulting Physician (Radiation Oncology) Cammie Sickle, MD as Consulting Physician (Oncology)  CHIEF COMPLAINTS/PURPOSE OF CONSULTATION: Lung cancer  #  Oncology History Overview Note  # OCT 2018- STAGE I- A. 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  - PARENCHYMAL MARGIN APPEARS CLEAR; TUMOR WAS 1 CM FROM MARGIN BEFORE REMOVAL OF STAPLE LINE.; NO adjuvant therapy.   # DEC 2021-- METASTATIC CARCINOMA, COMPATIBLE WITH PULMONARY ADENOCARCINOMA [left shoulder Core Biopsy];  NOV 24th PET-right hilar and Hypermetabolic mediastinal lymphadenopathy; small pulmonary nodules; 2 metastatic lesions in the liver/  pancreatic head/left adrenal gland;  muscle and skeletal metastasis.  Subcutaneous nodule-positive for metastatic adeno lung.   # DEC 2021-Brain MRI multiple subcentimeter enhancing brain mets no significant edema - NO SYMPTOMS;s/p RT eval [Dr.Blackburn/Crystal-s/p WBRT- JAN 11th, 2022]  # JAN 14th, 2021-START DABRFENIB +MEKINIST [name] 2022- MUGA scan-EF-58%]; STOPPED MID OCT 2022- sec to acute pancreatitis  # NOV 7th, 2022- Enco+Bini.   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  Short course of Eliquis.smoking-quit 2008.  ---------------------------------------    DIAGNOSIS: LUNG CA  STAGE:  IV      ;GOALS:palliation     Primary cancer of right upper lobe of lung ([place])  04/09/2020 Cancer Staging   Staging form: Lung, AJCC 8th Edition - Clinical: Stage IVB (cN1, cM1c) - Signed by Cammie Sickle, MD on 04/09/2020       HISTORY OF  PRESENTING ILLNESS: Ambulating by independently.  Accompanied by his wife.    Curtis Andrade 76 y.o.  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  Pt in for follow up, denies any concerns today.  Seen by Dr.Vaslow;   Given history of seizures patient is is currently on Vimpat.  Tolerating much better.  Much improved cognition.  Fatigue improved.  No headaches.  No seizures.   No nausea vomiting.  Denies any abdominal pain.   Review of Systems  Constitutional:  Positive for malaise/fatigue. Negative for chills, diaphoresis, fever and weight loss.  HENT:  Negative for nosebleeds and sore throat.   Eyes:  Negative for double vision.  Respiratory:  Negative for cough, hemoptysis, sputum production, shortness of breath and wheezing.   Cardiovascular:  Negative for chest pain, palpitations, orthopnea and leg swelling.  Gastrointestinal:  Negative for abdominal pain, blood in stool, constipation, diarrhea, heartburn, melena, nausea and vomiting.  Genitourinary:  Negative for dysuria, frequency and urgency.  Musculoskeletal:  Positive for back pain and joint pain.  Skin: Negative.  Negative for itching and rash.  Neurological:  Positive for weakness. Negative for dizziness, tingling, focal weakness and headaches.  Endo/Heme/Allergies:  Does not bruise/bleed easily.  Psychiatric/Behavioral:  Positive for memory loss. Negative for depression. The patient is not nervous/anxious and does not have insomnia.     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 Baruch Gouty and Dr. Nadeen Landau   Phlebitis 1998   Primary cancer of  right upper lobe of lung ([place]) 01/14/2017   Dr. Genevive Bi performed a wedge resection of RUL.    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/27/2016    SURGICAL HISTORY: Past Surgical History:  Procedure Laterality Date   COLONOSCOPY  2013   Dr Vira Agar   COLONOSCOPY WITH PROPOFOL N/A 12/23/2016   Procedure: COLONOSCOPY WITH PROPOFOL;  Surgeon: Robert Bellow, MD;  Location: [facility] ENDOSCOPY;  Service: Endoscopy;  Laterality: N/A;   EYE SURGERY     HERNIA REPAIR Right 1990's   TENDON REPAIR Left 03/14/2019   Procedure: TENDON REPAIR AND GRAFT  TRANSFER EXTENSOR INDICIS PROPRIUS LEFT THUMB;  Surgeon: [name], MD;  Location: [facility];  Service: Orthopedics;  Laterality: Left;  AXILLARY BLOCK   THORACOTOMY Right 01/14/2017   Procedure: RIGHT THORACOSCOPYWITH WIDE WEDGE RESECTION,  PREOP BROCHOSCOPY;  Surgeon: [name], MD;  Location: [facility] ORS;  Service: General;  Laterality: Right;   THROAT SURGERY  2010    SOCIAL HISTORY: Social History   Socioeconomic History   Marital status: Married    Spouse name: Not on file   Number of children: Not on file   Years of education: Not on file   Highest education level: Not on file  Occupational History   Not on file  Tobacco Use   Smoking status: Former    Years: 50.00    Types: Cigarettes    Quit date: 03/31/2007    Years since quitting: 14.4   Smokeless tobacco: Never  Vaping Use   Vaping Use: Never used  Substance and Sexual Activity   Alcohol use: No   Drug use: No   Sexual activity: Yes    Birth control/protection: None    Comment: Married  Other Topics Concern   Not on file  Social History Narrative   Not on file   Social Determinants of Health   Financial Resource Strain: Not on file  Food Insecurity: Not on file  Transportation Needs: Not on file  Physical Activity: Not on file  Stress: Not on file  Social Connections: Not on file  Intimate Partner Violence: Not on file    FAMILY HISTORY: Family History   Problem Relation Age of Onset   Alzheimer's disease Mother    Alzheimer's disease Father    Stroke Father    Colon cancer Neg Hx     ALLERGIES:  has No Known Allergies.  MEDICATIONS:  Current Outpatient Medications  Medication Sig Dispense Refill   Ascorbic Acid (VITAMIN C) 1000 MG tablet Take 1,000 mg by mouth daily.     aspirin EC 81 MG tablet Take 81 mg by mouth daily.     binimetinib (MEKTOVI) 15 MG tablet Take 3 tablets (45 mg total) by mouth 2 (two) times daily. 180 tablet 2   Calcium Carbonate-Vit D-Min (CALCIUM 1200 PO) Take 1 tablet by mouth daily.     encorafenib (BRAFTOVI) 75 MG capsule Take 6 capsules (450 mg total) by mouth daily. 025 capsule 2   folic acid (FOLVITE) 852 MCG tablet Take 800 mcg by mouth daily.     lamoTRIgine (LAMICTAL) 25 MG tablet Take 2 tablets (50 mg total) by mouth daily. 180 tablet 1   Multiple Vitamin (MULTIVITAMIN WITH MINERALS) TABS tablet Take 1 tablet by mouth daily. Senior Multivitamin.     Multiple Vitamins-Minerals (EMERGEN-C IMMUNE PO) Take  1 tablet by mouth 2 (two) times daily.     Omega-3 1000 MG CAPS Take 1 tablet by mouth 1 day or 1 dose.     omeprazole (PRILOSEC) 40 MG capsule Take by mouth.     saw palmetto 80 MG capsule Take 80 mg by mouth daily.     No current facility-administered medications for this visit.      Marland Kitchen  PHYSICAL EXAMINATION: ECOG PERFORMANCE STATUS: 0 - Asymptomatic  Vitals:   08/29/21 0922  BP: (!) 144/81  Pulse: 66  Resp: 16  Temp: 97.8 F (36.6 C)  SpO2: 99%    Filed Weights   08/29/21 0922  Weight: 205 lb 6.4 oz (93.2 kg)     Physical Exam HENT:     Head: Normocephalic and atraumatic.     Mouth/Throat:     Pharynx: No oropharyngeal exudate.  Eyes:     Pupils: Pupils are equal, round, and reactive to light.  Cardiovascular:     Rate and Rhythm: Normal rate and regular rhythm.  Pulmonary:     Effort: Pulmonary effort is normal. No respiratory distress.     Breath sounds: Normal breath  sounds. No wheezing.  Abdominal:     General: Bowel sounds are normal. There is no distension.     Palpations: Abdomen is soft. There is no mass.     Tenderness: There is no abdominal tenderness. There is no guarding or rebound.  Musculoskeletal:        General: No tenderness. Normal range of motion.     Cervical back: Normal range of motion and neck supple.  Skin:    General: Skin is warm.  Neurological:     Mental Status: He is alert and oriented to person, place, and time.  Psychiatric:        Mood and Affect: Affect normal.  ;  LABORATORY DATA:  I have reviewed the data as listed Lab Results  Component Value Date   WBC 5.5 08/29/2021   HGB 14.7 08/29/2021   HCT 43.7 08/29/2021   MCV 98.6 08/29/2021   PLT 150 08/29/2021   Recent Labs    07/03/21 1317 07/31/21 1047 08/29/21 0900  NA 137 138 138  K 4.8 4.8 5.1  CL 105 102 101  CO2 _0 GLUCOSE 120* 122* 121*  BUN 25* 19 18  CREATININE 1.21 1.26* 1.23  CALCIUM 8.9 9.4 9.2  GFRNONAA >60 59* >60  PROT 7.0 7.2 7.6  ALBUMIN 3.9 3.9 3.8  AST 34 34 28  ALT 35 30 23  ALKPHOS 40 40 35*  BILITOT 0.5 0.7 0.4    RADIOGRAPHIC STUDIES: I have personally reviewed the radiological images as listed and agreed with the findings in the report. MR BRAIN W WO CONTRAST  Result Date: 08/10/2021 CLINICAL DATA:  Lung cancer follow-up EXAM: MRI HEAD WITHOUT AND WITH CONTRAST TECHNIQUE: Multiplanar, multiecho pulse sequences of the brain and surrounding structures were obtained without and with intravenous contrast. CONTRAST:  37m GADAVIST GADOBUTROL 1 MMOL/ML IV SOLN COMPARISON:  03/27/2021 FINDINGS: Brain: Stable small foci of hemosiderin deposition scattered in the brain, with 2 left parietal nodules showing trace enhancement on 18:125 and 18:128. Generalized brain atrophy with periventricular chronic small vessel ischemia. No hemorrhage, interval infarct, hydrocephalus, or collection Vascular: Normal flow voids and vascular  enhancements Skull and upper cervical spine: Normal marrow signal Sinuses/Orbits: Trace right mastoid opacification. Bilateral cataract resection. IMPRESSION: Stable treated metastatic disease. No evidence of recurrent disease. Electronically Signed  By: Jorje Guild M.D.   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  #For now continue Enco+Bini--continue current therapy.STABLE.  Tolerating well without any major side effects.  #? Seizures-DEC 2022 [Dr.vaslow]--currently on Vimpat.  Tolerating well.  No side effects noted.   MRI Aug 08, 2021- STABLE s/p-Dr.Vaslow.  Stable.  # right temple Geddes s/p excision [Dr.Dasher q6M]-monitor closely on B-raf inhibitors.  # Brain metastases: s/p WBRT [dec 2021]; DEC 29th, 2022-interval improvement in numerous hemorrhagic metastases in the brain, which have decreased in conspicuity. No new lesions identified.  Stable.  See above.  # Bone lesions-mildly symptomatic; continue Zometa every 4-6 weeks [30 mins; tylenol]. with infusion premedicated with Tylenol at home.;  Calcium normal today.  Proceed with Zometa.  Given patient's poor tolerance [myalgias/fatigue over 1 to 2 days postinfusion]-I think is reasonable to switch to every 2 months. STABLE.     Zometa q 2 M- next 7/2 [mon/-wed appt]   DISPOSITION:  # follow up 5 weeks-; MD; labs- cbc/cmp# Zometa today- 30 mins ;Dr.B      All  questions were answered. The patient knows to call the clinic with any problems, questions or concerns.    Cammie Sickle, MD 08/29/2021 9:48 AM

## 2021-09-03 ENCOUNTER — Other Ambulatory Visit: Payer: Self-pay | Admitting: Radiation Therapy

## 2021-10-03 ENCOUNTER — Inpatient Hospital Stay: Payer: PPO | Attending: Internal Medicine | Admitting: Internal Medicine

## 2021-10-03 ENCOUNTER — Encounter: Payer: Self-pay | Admitting: Internal Medicine

## 2021-10-03 ENCOUNTER — Inpatient Hospital Stay: Payer: PPO

## 2021-10-03 DIAGNOSIS — C7972 Secondary malignant neoplasm of left adrenal gland: Secondary | ICD-10-CM | POA: Insufficient documentation

## 2021-10-03 DIAGNOSIS — C7951 Secondary malignant neoplasm of bone: Secondary | ICD-10-CM | POA: Diagnosis not present

## 2021-10-03 DIAGNOSIS — Z923 Personal history of irradiation: Secondary | ICD-10-CM | POA: Insufficient documentation

## 2021-10-03 DIAGNOSIS — C3411 Malignant neoplasm of upper lobe, right bronchus or lung: Secondary | ICD-10-CM | POA: Diagnosis not present

## 2021-10-03 DIAGNOSIS — C7931 Secondary malignant neoplasm of brain: Secondary | ICD-10-CM | POA: Insufficient documentation

## 2021-10-03 DIAGNOSIS — Z79899 Other long term (current) drug therapy: Secondary | ICD-10-CM | POA: Diagnosis not present

## 2021-10-03 DIAGNOSIS — C787 Secondary malignant neoplasm of liver and intrahepatic bile duct: Secondary | ICD-10-CM | POA: Diagnosis not present

## 2021-10-03 DIAGNOSIS — K859 Acute pancreatitis without necrosis or infection, unspecified: Secondary | ICD-10-CM | POA: Diagnosis not present

## 2021-10-03 DIAGNOSIS — C7800 Secondary malignant neoplasm of unspecified lung: Secondary | ICD-10-CM | POA: Insufficient documentation

## 2021-10-03 LAB — COMPREHENSIVE METABOLIC PANEL
ALT: 21 U/L (ref 0–44)
AST: 26 U/L (ref 15–41)
Albumin: 3.8 g/dL (ref 3.5–5.0)
Alkaline Phosphatase: 43 U/L (ref 38–126)
Anion gap: 6 (ref 5–15)
BUN: 17 mg/dL (ref 8–23)
CO2: 29 mmol/L (ref 22–32)
Calcium: 8.9 mg/dL (ref 8.9–10.3)
Chloride: 101 mmol/L (ref 98–111)
Creatinine, Ser: 1.16 mg/dL (ref 0.61–1.24)
GFR, Estimated: >60 mL/min (ref >60)
Glucose, Bld: 115 mg/dL — ABNORMAL HIGH (ref 70–99)
Potassium: 4.6 mmol/L (ref 3.5–5.1)
Sodium: 136 mmol/L (ref 135–145)
Total Bilirubin: 0.6 mg/dL (ref 0.3–1.2)
Total Protein: 7.2 g/dL (ref 6.5–8.1)

## 2021-10-03 LAB — CBC WITH DIFFERENTIAL/PLATELET
Abs Immature Granulocytes: 0.03 10*3/uL (ref 0.00–0.07)
Basophils Absolute: 0.1 10*3/uL (ref 0.0–0.1)
Basophils Relative: 1 %
Eosinophils Absolute: 0.2 10*3/uL (ref 0.0–0.5)
Eosinophils Relative: 4 %
HCT: 44.3 % (ref 39.0–52.0)
Hemoglobin: 14.6 g/dL (ref 13.0–17.0)
Immature Granulocytes: 1 %
Lymphocytes Relative: 24 %
Lymphs Abs: 1.4 10*3/uL (ref 0.7–4.0)
MCH: 32.7 pg (ref 26.0–34.0)
MCHC: 33 g/dL (ref 30.0–36.0)
MCV: 99.1 fL (ref 80.0–100.0)
Monocytes Absolute: 0.6 10*3/uL (ref 0.1–1.0)
Monocytes Relative: 9 %
Neutro Abs: 3.7 10*3/uL (ref 1.7–7.7)
Neutrophils Relative %: 61 %
Platelets: 164 10*3/uL (ref 150–400)
RBC: 4.47 MIL/uL (ref 4.22–5.81)
RDW: 13.9 % (ref 11.5–15.5)
WBC: 6 10*3/uL (ref 4.0–10.5)
nRBC: 0 % (ref 0.0–0.2)

## 2021-10-03 NOTE — Progress Notes (Signed)
Asking if you would sign a handicap placard for him?

## 2021-10-03 NOTE — Progress Notes (Signed)
RN notified no Zometa today

## 2021-10-03 NOTE — Progress Notes (Signed)
[facility] NOTE  Patient Care Team: [name], MD as PCP - General (Family Medicine) [name], MD as Consulting Physician (Family Medicine) [name], [name], MD (General Surgery) Thornton Park, DO as Consulting Physician (Radiation Oncology) Cammie Sickle, MD as Consulting Physician (Oncology)  CHIEF COMPLAINTS/PURPOSE OF CONSULTATION: Lung cancer  #  Oncology History Overview Note  # OCT 2018- STAGE I- A. 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  - PARENCHYMAL MARGIN APPEARS CLEAR; TUMOR WAS 1 CM FROM MARGIN BEFORE REMOVAL OF STAPLE LINE.; NO adjuvant therapy.   # DEC 2021-- METASTATIC CARCINOMA, COMPATIBLE WITH PULMONARY ADENOCARCINOMA [left shoulder Core Biopsy];  NOV 24th PET-right hilar and Hypermetabolic mediastinal lymphadenopathy; small pulmonary nodules; 2 metastatic lesions in the liver/  pancreatic head/left adrenal gland;  muscle and skeletal metastasis.  Subcutaneous nodule-positive for metastatic adeno lung.   # DEC 2021-Brain MRI multiple subcentimeter enhancing brain mets no significant edema - NO SYMPTOMS;s/p RT eval [Dr.Blackburn/Crystal-s/p WBRT- JAN 11th, 2022]  # JAN 14th, 2021-START DABRFENIB +MEKINIST [name] 2022- MUGA scan-EF-58%]; STOPPED MID OCT 2022- sec to acute pancreatitis  # NOV 7th, 2022- Enco+Bini.   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  Short course of Eliquis.smoking-quit 2008.  ---------------------------------------    DIAGNOSIS: LUNG CA  STAGE:  IV      ;GOALS:palliation     Primary cancer of right upper lobe of lung ([place])  04/09/2020 Cancer Staging   Staging form: Lung, AJCC 8th Edition - Clinical: Stage IVB (cN1, cM1c) - Signed by Cammie Sickle, MD on 04/09/2020      HISTORY OF  PRESENTING ILLNESS: Ambulating by independently.  Accompanied by his wife.    Curtis Andrade 76 y.o.  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  Continues to complain of fatigue.  No further seizures.  No headaches.  No nausea vomiting.  Denies any abdominal pain.   Review of Systems  Constitutional:  Positive for malaise/fatigue. Negative for chills, diaphoresis, fever and weight loss.  HENT:  Negative for nosebleeds and sore throat.   Eyes:  Negative for double vision.  Respiratory:  Negative for cough, hemoptysis, sputum production, shortness of breath and wheezing.   Cardiovascular:  Negative for chest pain, palpitations, orthopnea and leg swelling.  Gastrointestinal:  Negative for abdominal pain, blood in stool, constipation, diarrhea, heartburn, melena, nausea and vomiting.  Genitourinary:  Negative for dysuria, frequency and urgency.  Musculoskeletal:  Positive for back pain and joint pain.  Skin: Negative.  Negative for itching and rash.  Neurological:  Positive for weakness. Negative for dizziness, tingling, focal weakness and headaches.  Endo/Heme/Allergies:  Does not bruise/bleed easily.  Psychiatric/Behavioral:  Positive for memory loss. Negative for depression. The patient is not nervous/anxious and does not have insomnia.      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 Baruch Gouty and Dr. Nadeen Landau   Phlebitis 1998   Primary cancer of right upper lobe  of lung ([place]) 01/14/2017   Dr. Genevive Bi performed a wedge resection of RUL.    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/27/2016    SURGICAL HISTORY: Past  Surgical History:  Procedure Laterality Date   COLONOSCOPY  2013   Dr Vira Agar   COLONOSCOPY WITH PROPOFOL N/A 12/23/2016   Procedure: COLONOSCOPY WITH PROPOFOL;  Surgeon: Robert Bellow, MD;  Location[facility] ENDOSCOPY;  Service: Endoscopy;  Laterality: N/A;   EYE SURGERY     HERNIA REPAIR Right 1990's   TENDON REPAIR Left 03/14/2019   Procedure: TENDON REPAIR AND GRAFT  TRANSFER EXTENSOR INDICIS PROPRIUS LEFT THUMB;  Surgeon: [name], MD;  Location: [place];  Service: Orthopedics;  Laterality: Left;  AXILLARY BLOCK   THORACOTOMY Right 01/14/2017   Procedure: RIGHT THORACOSCOPYWITH WIDE WEDGE RESECTION,  PREOP BROCHOSCOPY;  Surgeon: [name], MD;  Location: [facility] ORS;  Service: General;  Laterality: Right;   THROAT SURGERY  2010    SOCIAL HISTORY: Social History   Socioeconomic History   Marital status: Married    Spouse name: Not on file   Number of children: Not on file   Years of education: Not on file   Highest education level: Not on file  Occupational History   Not on file  Tobacco Use   Smoking status: Former    Years: 50.00    Types: Cigarettes    Quit date: 03/31/2007    Years since quitting: 14.5   Smokeless tobacco: Never  Vaping Use   Vaping Use: Never used  Substance and Sexual Activity   Alcohol use: No   Drug use: No   Sexual activity: Yes    Birth control/protection: None    Comment: Married  Other Topics Concern   Not on file  Social History Narrative   Not on file   Social Determinants of Health   Financial Resource Strain: Not on file  Food Insecurity: Not on file  Transportation Needs: Not on file  Physical Activity: Not on file  Stress: Not on file  Social Connections: Not on file  Intimate Partner Violence: Not on file    FAMILY HISTORY: Family History  Problem Relation Age of Onset   Alzheimer's disease Mother    Alzheimer's disease Father    Stroke Father    Colon cancer Neg Hx     ALLERGIES:  has No  Known Allergies.  MEDICATIONS:  Current Outpatient Medications  Medication Sig Dispense Refill   Ascorbic Acid (VITAMIN C) 1000 MG tablet Take 1,000 mg by mouth daily.     aspirin EC 81 MG tablet Take 81 mg by mouth daily.     binimetinib (MEKTOVI) 15 MG tablet Take 3 tablets (45 mg total) by mouth 2 (two) times daily. 180 tablet 2   Calcium Carbonate-Vit D-Min (CALCIUM 1200 PO) Take 1 tablet by mouth daily.     encorafenib (BRAFTOVI) 75 MG capsule Take 6 capsules (450 mg total) by mouth daily. 270 capsule 2   folic acid (FOLVITE) 350 MCG tablet Take 800 mcg by mouth daily.     lamoTRIgine (LAMICTAL) 25 MG tablet Take 2 tablets (50 mg total) by mouth daily. 180 tablet 1   Multiple Vitamin (MULTIVITAMIN WITH MINERALS) TABS tablet Take 1 tablet by mouth daily. Senior Multivitamin.     Multiple Vitamins-Minerals (EMERGEN-C IMMUNE PO) Take 1 tablet by mouth 2 (two) times daily.     Omega-3 1000 MG CAPS Take 1 tablet by mouth 1 day or 1 dose.     omeprazole (  PRILOSEC) 40 MG capsule Take by mouth.     saw palmetto 80 MG capsule Take 80 mg by mouth daily.     No current facility-administered medications for this visit.      Marland Kitchen  PHYSICAL EXAMINATION: ECOG PERFORMANCE STATUS: 0 - Asymptomatic  Vitals:   10/03/21 1419  BP: 137/80  Pulse: 76  Temp: (!) 97.5 F (36.4 C)  SpO2: 99%    Filed Weights   10/03/21 1419  Weight: 207 lb 3.2 oz (94 kg)     Physical Exam HENT:     Head: Normocephalic and atraumatic.     Mouth/Throat:     Pharynx: No oropharyngeal exudate.  Eyes:     Pupils: Pupils are equal, round, and reactive to light.  Cardiovascular:     Rate and Rhythm: Normal rate and regular rhythm.  Pulmonary:     Effort: Pulmonary effort is normal. No respiratory distress.     Breath sounds: Normal breath sounds. No wheezing.  Abdominal:     General: Bowel sounds are normal. There is no distension.     Palpations: Abdomen is soft. There is no mass.     Tenderness: There is  no abdominal tenderness. There is no guarding or rebound.  Musculoskeletal:        General: No tenderness. Normal range of motion.     Cervical back: Normal range of motion and neck supple.  Skin:    General: Skin is warm.  Neurological:     Mental Status: He is alert and oriented to person, place, and time.  Psychiatric:        Mood and Affect: Affect normal.   ;  LABORATORY DATA:  I have reviewed the data as listed Lab Results  Component Value Date   WBC 6.0 10/03/2021   HGB 14.6 10/03/2021   HCT 44.3 10/03/2021   MCV 99.1 10/03/2021   PLT 164 10/03/2021   Recent Labs    07/31/21 1047 08/29/21 0900 10/03/21 1419  NA 138 138 136  K 4.8 5.1 4.6  CL 102 101 101  CO2 '29 30 29  ' GLUCOSE 122* 121* 115*  BUN '19 18 17  ' CREATININE 1.26* 1.23 1.16  CALCIUM 9.4 9.2 8.9  GFRNONAA 59* >60 >60  PROT 7.2 7.6 7.2  ALBUMIN 3.9 3.8 3.8  AST 34 28 26  ALT '30 23 21  ' ALKPHOS 40 35* 43  BILITOT 0.7 0.4 0.6    RADIOGRAPHIC STUDIES: I have personally reviewed the radiological images as listed and agreed with the findings in the report. No results found.  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  #For now continue Enco+Bini--continue current therapy.STABLE.  Tolerating well without any major side effects.  We will repeat a CAT scan in 2 months.  Ordered today.  #? Seizures-DEC 2022 [Dr.vaslow]--currently on Vimpat.  Tolerating well.  No  side effects noted.   MRI Aug 08, 2021- STABLE s/p-Dr.Vaslow.  Stable  # right temple BCC s/p excision [Dr.Dasher q6M]-monitor closely on B-raf inhibitors.  #  Brain metastases: s/p WBRT [dec 2021]; DEC 29th, 2022-interval improvement in numerous hemorrhagic metastases in the brain, which have decreased in conspicuity. No new lesions identified.  Stable.  Await repeat MRI in August /Dr.Vaslow. see above.  # Bone lesions-mildly symptomatic-[30 mins; tylenol]. premedicated with Tylenol at home.;  Calcium normal today. Given patient's poor tolerance [myalgias/fatigue over 1 to 2 days postinfusion]-I think is reasonable to switch to every 4 months. STABLE.  # Fatigue: Likely secondary to whole brain radiation.  Okay to give pulmonary parking pass.   Zometa q 4 [09/23]  [mon/-wed appt]   DISPOSITION:  # HOLD zometa today # follow up  2 months MD; labs- cbc/cmp;  Zometa today- 30 mins;  CT CAP-Dr.B    All questions were answered. The patient knows to call the clinic with any problems, questions or concerns.    Cammie Sickle, MD 10/03/2021 4:35 PM

## 2021-10-03 NOTE — Assessment & Plan Note (Addendum)
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  #For now continue Enco+Bini--continue current therapy.STABLE.  Tolerating well without any major side effects.  We will repeat a CAT scan in 2 months.  Ordered today.  #? Seizures-DEC 2022 [Dr.vaslow]--currently on Vimpat.  Tolerating well.  No side effects noted.   MRI Aug 08, 2021- STABLE s/p-Dr.Vaslow.  Stable  # right temple BCC s/p excision [Dr.Dasher q6M]-monitor closely on B-raf inhibitors.  # Brain metastases: s/p WBRT [dec 2021]; DEC 29th, 2022-interval improvement in numerous hemorrhagic metastases in the brain, which have decreased in conspicuity. No new lesions identified.  Stable.  Await repeat MRI in August /Dr.Vaslow. see above.  # Bone lesions-mildly symptomatic-[30 mins; tylenol]. premedicated with Tylenol at home.;  Calcium normal today. Given patient's poor tolerance [myalgias/fatigue over 1 to 2 days postinfusion]-I think is reasonable to switch to every 4 months. STABLE.  # Fatigue: Likely secondary to whole brain radiation.  Okay to give pulmonary parking pass.   Zometa q 4 [09/23]  [mon/-wed appt]   DISPOSITION:  # HOLD zometa today # follow up  2 months MD; labs- cbc/cmp;  Zometa today- 30 mins;  CT CAP-Dr.B

## 2021-11-11 ENCOUNTER — Ambulatory Visit
Admission: RE | Admit: 2021-11-11 | Discharge: 2021-11-11 | Disposition: A | Discharge status: Home / Self Care | Payer: PPO | Source: Ambulatory Visit | Attending: Internal Medicine | Admitting: Internal Medicine

## 2021-11-11 DIAGNOSIS — C3490 Malignant neoplasm of unspecified part of unspecified bronchus or lung: Secondary | ICD-10-CM | POA: Diagnosis not present

## 2021-11-11 DIAGNOSIS — C7931 Secondary malignant neoplasm of brain: Secondary | ICD-10-CM | POA: Insufficient documentation

## 2021-11-11 MED ORDER — GADOBUTROL 1 MMOL/ML IV SOLN
9.0000 mL | Freq: Once | INTRAVENOUS | Status: AC | PRN
  Administered 2021-11-11: 9 mL via INTRAVENOUS

## 2021-11-12 DIAGNOSIS — E663 Overweight: Secondary | ICD-10-CM | POA: Diagnosis not present

## 2021-11-12 DIAGNOSIS — K8080 Other cholelithiasis without obstruction: Secondary | ICD-10-CM | POA: Diagnosis not present

## 2021-11-12 DIAGNOSIS — L409 Psoriasis, unspecified: Secondary | ICD-10-CM | POA: Diagnosis not present

## 2021-11-12 DIAGNOSIS — R569 Unspecified convulsions: Secondary | ICD-10-CM | POA: Diagnosis not present

## 2021-11-12 DIAGNOSIS — C7951 Secondary malignant neoplasm of bone: Secondary | ICD-10-CM | POA: Diagnosis not present

## 2021-11-12 DIAGNOSIS — K5900 Constipation, unspecified: Secondary | ICD-10-CM | POA: Diagnosis not present

## 2021-11-12 DIAGNOSIS — C7931 Secondary malignant neoplasm of brain: Secondary | ICD-10-CM | POA: Diagnosis not present

## 2021-11-12 DIAGNOSIS — K219 Gastro-esophageal reflux disease without esophagitis: Secondary | ICD-10-CM | POA: Diagnosis not present

## 2021-11-12 DIAGNOSIS — I714 Abdominal aortic aneurysm, without rupture, unspecified: Secondary | ICD-10-CM | POA: Diagnosis not present

## 2021-11-12 DIAGNOSIS — J432 Centrilobular emphysema: Secondary | ICD-10-CM | POA: Diagnosis not present

## 2021-11-12 DIAGNOSIS — Z7982 Long term (current) use of aspirin: Secondary | ICD-10-CM | POA: Diagnosis not present

## 2021-11-12 DIAGNOSIS — N200 Calculus of kidney: Secondary | ICD-10-CM | POA: Diagnosis not present

## 2021-11-17 ENCOUNTER — Inpatient Hospital Stay: Payer: PPO | Attending: Internal Medicine

## 2021-11-17 DIAGNOSIS — Z125 Encounter for screening for malignant neoplasm of prostate: Secondary | ICD-10-CM | POA: Diagnosis not present

## 2021-11-21 ENCOUNTER — Inpatient Hospital Stay: Payer: PPO | Attending: Internal Medicine | Admitting: Internal Medicine

## 2021-11-21 ENCOUNTER — Encounter: Payer: Self-pay | Admitting: Internal Medicine

## 2021-11-21 VITALS — BP 135/83 | HR 69 | Temp 96.8°F | Ht 71.0 in | Wt 208.0 lb

## 2021-11-21 DIAGNOSIS — C787 Secondary malignant neoplasm of liver and intrahepatic bile duct: Secondary | ICD-10-CM | POA: Diagnosis not present

## 2021-11-21 DIAGNOSIS — C7951 Secondary malignant neoplasm of bone: Secondary | ICD-10-CM | POA: Insufficient documentation

## 2021-11-21 DIAGNOSIS — C3411 Malignant neoplasm of upper lobe, right bronchus or lung: Secondary | ICD-10-CM | POA: Diagnosis not present

## 2021-11-21 DIAGNOSIS — R569 Unspecified convulsions: Secondary | ICD-10-CM

## 2021-11-21 DIAGNOSIS — C7931 Secondary malignant neoplasm of brain: Secondary | ICD-10-CM | POA: Diagnosis not present

## 2021-11-21 DIAGNOSIS — C7972 Secondary malignant neoplasm of left adrenal gland: Secondary | ICD-10-CM | POA: Insufficient documentation

## 2021-11-21 DIAGNOSIS — Z87891 Personal history of nicotine dependence: Secondary | ICD-10-CM | POA: Insufficient documentation

## 2021-11-21 NOTE — Progress Notes (Signed)
[facility] [place], [place] [address] [phone]   Interval Evaluation  Date of Service: 11/21/21 Patient Name: Curtis Andrade. Patient MRN: [id number] Patient DOB: May 16, 1945 Provider: Ventura Sellers, MD  Identifying Statement:  Curtis Andrade. is a 76 y.o. male with Malignant neoplasm metastatic to brain  [facility])  Focal seizure ([place])   Primary Cancer:  Oncologic History: Oncology History Overview Note  # OCT 2018- STAGE I- A. 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  - PARENCHYMAL MARGIN APPEARS CLEAR; TUMOR WAS 1 CM FROM MARGIN BEFORE REMOVAL OF STAPLE LINE.; NO adjuvant therapy.   # DEC 2021-- METASTATIC CARCINOMA, COMPATIBLE WITH PULMONARY ADENOCARCINOMA [left shoulder Core Biopsy];  NOV 24th PET-right hilar and Hypermetabolic mediastinal lymphadenopathy; small pulmonary nodules; 2 metastatic lesions in the liver/  pancreatic head/left adrenal gland;  muscle and skeletal metastasis.  Subcutaneous nodule-positive for metastatic adeno lung.   # DEC 2021-Brain MRI multiple subcentimeter enhancing brain mets no significant edema - NO SYMPTOMS;s/p RT eval [Dr.Blackburn/[facility]-s/p WBRT- JAN 11th, 2022]  # JAN 14th, 2021-START DABRFENIB +MEKINIST Curtis Andrade 2022- MUGA scan-EF-58%]; STOPPED MID OCT 2022- sec to acute pancreatitis  # NOV 7th, 2022- Enco+Bini.   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  Short course of Eliquis.smoking-quit 2008.  ---------------------------------------    DIAGNOSIS: LUNG CA  STAGE:  IV      ;GOALS:palliation     Primary cancer of right upper lobe of lung ([place])  04/09/2020 Cancer Staging   Staging form: Lung, AJCC 8th Edition - Clinical: Stage IVB (cN1, cM1c) - Signed by Cammie Sickle, MD on  04/09/2020    CNS Oncologic History 04/09/21: Completes WBRT for 11+ visible metastases ([facility])  Interval History: Curtis Andrade. Presents today for follow up after MRI brain.  No new or progressive changes today.  No further seizures, tolerating lamictal well.  Continues to follow with Dr. B for oral TKI.  H+P (04/11/21) Presents today to review recent neurologic episode.  His wife describes episode of sudden confusion, shaking, disorientation, roughly one month ago.  His "whole body was shaking", which he remembers, but shortly after that became confused and non-sensical, which improved slowly over several days.  He describes a period of almost 24 hours with "total blackout, no memory".  Since then, he has been back at his normal baseline, with no recurrence.  He completed whole brain radiation ~1 year ago with Dr. Donella Stade for brain metastases.  Continues on oral TKIs for BRAF+ lung cancer with Dr. Rogue Bussing.  Medications: Current Outpatient Medications on File Prior to Visit  Medication Sig Dispense Refill   Ascorbic Acid (VITAMIN C) 1000 MG tablet Take 1,000 mg by mouth daily.     aspirin EC 81 MG tablet Take 81 mg by mouth daily.     binimetinib (MEKTOVI) 15 MG tablet Take 3 tablets (45 mg total) by mouth 2 (two) times daily. 180 tablet 2   Calcium Carbonate-Vit D-Min (CALCIUM 1200 PO) Take 1 tablet by mouth daily.     encorafenib (BRAFTOVI) 75 MG capsule Take 6 capsules (450 mg total) by mouth daily. 947 capsule 2   folic acid (FOLVITE) 654 MCG tablet Take 800 mcg by mouth daily.     lamoTRIgine (LAMICTAL) 25 MG tablet Take 2 tablets (50 mg  total) by mouth daily. 180 tablet 1   Multiple Vitamin (MULTIVITAMIN WITH MINERALS) TABS tablet Take 1 tablet by mouth daily. Senior Multivitamin.     Multiple Vitamins-Minerals (EMERGEN-C IMMUNE PO) Take 1 tablet by mouth 2 (two) times daily.     Omega-3 1000 MG CAPS Take 1 tablet by mouth 1 day or 1 dose.     omeprazole (PRILOSEC) 40 MG  capsule Take by mouth.     saw palmetto 80 MG capsule Take 80 mg by mouth daily.     No current facility-administered medications on file prior to visit.    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 Baruch Gouty and Dr. Nadeen Landau   Phlebitis 1998   Primary cancer of right upper lobe of lung ([place]) 01/14/2017   Dr. Genevive Bi performed a wedge resection of RUL.    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/27/2016   Past Surgical History:  Past Surgical History:  Procedure Laterality Date   COLONOSCOPY  2013   Dr Vira Agar   COLONOSCOPY WITH PROPOFOL N/A 12/23/2016   Procedure: COLONOSCOPY WITH PROPOFOL;  Surgeon: Robert Bellow, MD;  Location: [facility] END[facility]COPY;  Service: Endoscopy;  Laterality: N/A;   EYE SURGERY     HERNIA REPAIR Right 1990's   TENDON REPAIR Left 03/14/2019   Procedure: TENDON REPAIR AND GRAFT  TRANSFER EXTENSOR INDICIS PROPRIUS LEFT THUMB;  Surgeon: [name], MD;  Location: [facility];  Service: Orthopedics;  Laterality: Left;  AXILLARY BLOCK   THORACOTOMY Right 01/14/2017   Procedure: RIGHT THORACOSCOPYWITH WIDE WEDGE RESECTION,  PREOP BROCHOSCOPY;  Surgeon: [name], MD;  Location: [facility] ORS;  Service: General;  Laterality: Right;   THROAT SURGERY  2010   Social History:  Social History   Socioeconomic History   Marital status: Married    Spouse name: Not on file   Number of children: Not on file   Years of education: Not on file   Highest education level: Not on file  Occupational History   Not on file  Tobacco Use   Smoking status: Former    Years: 50.00    Types: Cigarettes    Quit date: 03/31/2007    Years since quitting: 14.6   Smokeless tobacco: Never   Vaping Use   Vaping Use: Never used  Substance and Sexual Activity   Alcohol use: No   Drug use: No   Sexual activity: Yes    Birth control/protection: None    Comment: Married  Other Topics Concern   Not on file  Social History Narrative   Not on file   Social Determinants of Health   Financial Resource Strain: Not on file  Food Insecurity: Not on file  Transportation Needs: Not on file  Physical Activity: Not on file  Stress: Not on file  Social Connections: Not on file  Intimate Partner Violence: Not on file   Family History:  Family History  Problem Relation Age of Onset   Alzheimer's disease Mother    Alzheimer's disease Father    Stroke Father    Colon cancer Neg Hx     Review of Systems: Constitutional: Doesn't report fevers, chills or abnormal weight loss Eyes: Doesn't report  blurriness of vision Ears, nose, mouth, throat, and face: Doesn't report sore throat Respiratory: Doesn't report cough, dyspnea or wheezes Cardiovascular: Doesn't report palpitation, chest discomfort  Gastrointestinal:  Doesn't report nausea, constipation, diarrhea GU: Doesn't report incontinence Skin: Doesn't report skin rashes Neurological: Per HPI Musculoskeletal: Doesn't report joint pain Behavioral/Psych: Doesn't report anxiety  Physical Exam: Vitals:   11/21/21 1024  BP: 135/83  Pulse: 69  Temp: (!) 96.8 F (36 C)    KPS: 90. General: Alert, cooperative, pleasant, in no acute distress Head: Normal EENT: No conjunctival injection or scleral icterus.  Lungs: Resp effort normal Cardiac: Regular rate Abdomen: Non-distended abdomen Skin: No rashes cyanosis or petechiae. Extremities: No clubbing or edema  Neurologic Exam: Mental Status: Awake, alert, attentive to examiner. Oriented to self and environment. Language is fluent with intact comprehension.  Cranial Nerves: Visual acuity is grossly normal. Visual fields are full. Extra-ocular movements intact. No ptosis. Face  is symmetric Motor: Tone and bulk are normal. Power is full in both arms and legs. Reflexes are symmetric, no pathologic reflexes present.  Sensory: Intact to light touch Gait: Normal.   Labs: I have reviewed the data as listed    Component Value Date/Time   NA 136 10/03/2021 1419   K 4.6 10/03/2021 1419   CL 101 10/03/2021 1419   CO2 29 10/03/2021 1419   GLUCOSE 115 (H) 10/03/2021 1419   BUN 17 10/03/2021 1419   CREATININE 1.16 10/03/2021 1419   CALCIUM 8.9 10/03/2021 1419   PROT 7.2 10/03/2021 1419   ALBUMIN 3.8 10/03/2021 1419   AST 26 10/03/2021 1419   ALT 21 10/03/2021 1419   ALKPHOS 43 10/03/2021 1419   BILITOT 0.6 10/03/2021 1419   GFRNONAA >60 10/03/2021 1419   GFRAA >60 02/10/2019 1003   Lab Results  Component Value Date   WBC 6.0 10/03/2021   NEUTROABS 3.7 10/03/2021   HGB 14.6 10/03/2021   HCT 44.3 10/03/2021   MCV 99.1 10/03/2021   PLT 164 10/03/2021    Imaging:  [place] Clinician Interpretation: I have personally reviewed the CNS images as listed.  My interpretation, in the context of the patient's clinical presentation, is stable disease  MR BRAIN W WO CONTRAST  Result Date: 11/12/2021 CLINICAL DATA:  Stage IV lung cancer. EXAM: MRI HEAD WITHOUT AND WITH CONTRAST TECHNIQUE: Multiplanar, multiecho pulse sequences of the brain and surrounding structures were obtained without and with intravenous contrast. CONTRAST:  86m GADAVIST GADOBUTROL 1 MMOL/ML IV SOLN COMPARISON:  Brain MRI 08/10/2021. FINDINGS: Brain: Numerous foci of hemosiderin deposition scattered throughout the brain are again seen consistent with treated metastatic disease. The punctate foci of enhancement in the left parietal lobe are unchanged (18-119, 18-116). There is no new or progressive enhancement. There is no acute intracranial hemorrhage, extra-axial fluid collection, or acute infarct. Parenchymal volume is stable. The ventricles are stable in size. Confluent FLAIR signal abnormality  throughout the supratentorial white matter is unchanged. There is no mass effect or midline shift. Vascular: Normal flow voids. Skull and upper cervical spine: Normal marrow signal. Sinuses/Orbits: The paranasal sinuses are clear. Bilateral lens implants are in place. The globes and orbits are otherwise unremarkable. Other: A small right mastoid effusion is unchanged. IMPRESSION: Stable exam with no evidence of recurrent disease. Electronically Signed   By: PValetta MoleM.D.   On: 11/12/2021 14:07    Assessment/Plan Malignant neoplasm metastatic to brain ([facility]  Focal seizure ([place]  Aran B CClementeen Graham is clinically and radiographically stable today.  Fatigue  and mild cognitive effects of radiation are noted.  He will con't Lamictal 58m daily for seizure prevention.  He is cleared to return to driving.   We appreciate the opportunity to participate in the care of [place].   We ask that [place] return to clinic in 3 months following next brain MRI, or sooner as needed.  All questions were answered. The patient knows to call the clinic with any problems, questions or concerns. No barriers to learning were detected.  The total time spent in the encounter was 30 minutes and more than 50% was on counseling and review of test results   ZVentura Sellers MD Medical Director of Neuro-Oncology [facility] WManuel Garcia08/25/23 10:29 AM

## 2021-11-24 ENCOUNTER — Ambulatory Visit
Admission: RE | Admit: 2021-11-24 | Discharge: 2021-11-24 | Disposition: A | Discharge status: Home / Self Care | Payer: PPO | Source: Ambulatory Visit | Attending: Internal Medicine | Admitting: Internal Medicine

## 2021-11-24 DIAGNOSIS — I700 Atherosclerosis of aorta: Secondary | ICD-10-CM | POA: Diagnosis not present

## 2021-11-24 DIAGNOSIS — R351 Nocturia: Secondary | ICD-10-CM | POA: Diagnosis not present

## 2021-11-24 DIAGNOSIS — I714 Abdominal aortic aneurysm, without rupture, unspecified: Secondary | ICD-10-CM | POA: Diagnosis not present

## 2021-11-24 DIAGNOSIS — K219 Gastro-esophageal reflux disease without esophagitis: Secondary | ICD-10-CM | POA: Diagnosis not present

## 2021-11-24 DIAGNOSIS — C3490 Malignant neoplasm of unspecified part of unspecified bronchus or lung: Secondary | ICD-10-CM | POA: Diagnosis not present

## 2021-11-24 DIAGNOSIS — Z0000 Encounter for general adult medical examination without abnormal findings: Secondary | ICD-10-CM | POA: Diagnosis not present

## 2021-11-24 DIAGNOSIS — R590 Localized enlarged lymph nodes: Secondary | ICD-10-CM | POA: Diagnosis not present

## 2021-11-24 DIAGNOSIS — K7689 Other specified diseases of liver: Secondary | ICD-10-CM | POA: Diagnosis not present

## 2021-11-24 DIAGNOSIS — C3411 Malignant neoplasm of upper lobe, right bronchus or lung: Secondary | ICD-10-CM | POA: Diagnosis not present

## 2021-11-24 DIAGNOSIS — N200 Calculus of kidney: Secondary | ICD-10-CM | POA: Diagnosis not present

## 2021-11-24 DIAGNOSIS — K8020 Calculus of gallbladder without cholecystitis without obstruction: Secondary | ICD-10-CM | POA: Diagnosis not present

## 2021-11-24 LAB — POCT I-STAT CREATININE: Creatinine, Ser: 1.1 mg/dL (ref 0.61–1.24)

## 2021-11-24 MED ORDER — IOHEXOL 300 MG/ML  SOLN
100.0000 mL | Freq: Once | INTRAMUSCULAR | Status: AC | PRN
  Administered 2021-11-24: 100 mL via INTRAVENOUS

## 2021-11-27 ENCOUNTER — Other Ambulatory Visit (HOSPITAL_COMMUNITY): Payer: Self-pay

## 2021-12-04 ENCOUNTER — Other Ambulatory Visit: Payer: Self-pay | Admitting: Pharmacist

## 2021-12-04 DIAGNOSIS — C3411 Malignant neoplasm of upper lobe, right bronchus or lung: Secondary | ICD-10-CM

## 2021-12-04 MED ORDER — ENCORAFENIB 75 MG PO CAPS: 450.0000 mg | ORAL_CAPSULE | Freq: Every day | ORAL | 3 refills | Status: DC

## 2021-12-04 MED ORDER — BINIMETINIB 15 MG PO TABS: 45.0000 mg | ORAL_TABLET | Freq: Two times a day (BID) | ORAL | 3 refills | Status: DC

## 2021-12-05 ENCOUNTER — Inpatient Hospital Stay: Payer: PPO

## 2021-12-05 ENCOUNTER — Encounter: Payer: Self-pay | Admitting: Internal Medicine

## 2021-12-05 ENCOUNTER — Inpatient Hospital Stay: Payer: PPO | Admitting: Internal Medicine

## 2021-12-05 ENCOUNTER — Inpatient Hospital Stay: Payer: PPO | Attending: Internal Medicine

## 2021-12-05 VITALS — BP 159/81 | HR 64 | Temp 96.5°F | Ht 71.0 in | Wt 212.4 lb

## 2021-12-05 DIAGNOSIS — Z87891 Personal history of nicotine dependence: Secondary | ICD-10-CM | POA: Diagnosis not present

## 2021-12-05 DIAGNOSIS — C3411 Malignant neoplasm of upper lobe, right bronchus or lung: Secondary | ICD-10-CM | POA: Diagnosis not present

## 2021-12-05 DIAGNOSIS — C7951 Secondary malignant neoplasm of bone: Secondary | ICD-10-CM | POA: Insufficient documentation

## 2021-12-05 LAB — CBC WITH DIFFERENTIAL/PLATELET
Abs Immature Granulocytes: 0.02 10*3/uL (ref 0.00–0.07)
Basophils Absolute: 0.1 10*3/uL (ref 0.0–0.1)
Basophils Relative: 1 %
Eosinophils Absolute: 0.3 10*3/uL (ref 0.0–0.5)
Eosinophils Relative: 5 %
HCT: 41.3 % (ref 39.0–52.0)
Hemoglobin: 13.6 g/dL (ref 13.0–17.0)
Immature Granulocytes: 0 %
Lymphocytes Relative: 22 %
Lymphs Abs: 1.3 10*3/uL (ref 0.7–4.0)
MCH: 32.6 pg (ref 26.0–34.0)
MCHC: 32.9 g/dL (ref 30.0–36.0)
MCV: 99 fL (ref 80.0–100.0)
Monocytes Absolute: 0.5 10*3/uL (ref 0.1–1.0)
Monocytes Relative: 9 %
Neutro Abs: 3.8 10*3/uL (ref 1.7–7.7)
Neutrophils Relative %: 63 %
Platelets: 147 10*3/uL — ABNORMAL LOW (ref 150–400)
RBC: 4.17 MIL/uL — ABNORMAL LOW (ref 4.22–5.81)
RDW: 14.3 % (ref 11.5–15.5)
WBC: 6 10*3/uL (ref 4.0–10.5)
nRBC: 0 % (ref 0.0–0.2)

## 2021-12-05 LAB — COMPREHENSIVE METABOLIC PANEL
ALT: 19 U/L (ref 0–44)
AST: 26 U/L (ref 15–41)
Albumin: 3.9 g/dL (ref 3.5–5.0)
Alkaline Phosphatase: 43 U/L (ref 38–126)
Anion gap: 6 (ref 5–15)
BUN: 19 mg/dL (ref 8–23)
CO2: 28 mmol/L (ref 22–32)
Calcium: 8.7 mg/dL — ABNORMAL LOW (ref 8.9–10.3)
Chloride: 105 mmol/L (ref 98–111)
Creatinine, Ser: 1.1 mg/dL (ref 0.61–1.24)
GFR, Estimated: >60 mL/min (ref >60)
Glucose, Bld: 113 mg/dL — ABNORMAL HIGH (ref 70–99)
Potassium: 4.6 mmol/L (ref 3.5–5.1)
Sodium: 139 mmol/L (ref 135–145)
Total Bilirubin: 0.4 mg/dL (ref 0.3–1.2)
Total Protein: 6.9 g/dL (ref 6.5–8.1)

## 2021-12-05 MED ORDER — SODIUM CHLORIDE 0.9 % IV SOLN
Freq: Once | INTRAVENOUS | Status: AC
  Filled 2021-12-05: qty 250

## 2021-12-05 MED ORDER — ZOLEDRONIC ACID 4 MG/100ML IV SOLN
4.0000 mg | Freq: Once | INTRAVENOUS | Status: AC
  Administered 2021-12-05: 4 mg via INTRAVENOUS

## 2021-12-05 NOTE — Patient Instructions (Signed)

## 2021-12-05 NOTE — Progress Notes (Signed)
Braftovi sch'd to come in tomorrow about lunch time, should he go ahead to take?

## 2021-12-05 NOTE — Assessment & Plan Note (Addendum)
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 Stable post partial lung resection in the RIGHT upper lobe. 2. Widespread sclerotic metastatic lesions are unchanged. 3. No new or progressive findings. 4. Stable periportal lymph node enlargement that showed no increased metabolism on prior PET imaging but warrants attention on follow-up based on size. Based on fissural widening of hepatic fissures would also correlate with any signs of liver disease that could lead to reactive lymph nodes in this location. 5. Cholelithiasis. 6. 4 mm RIGHT lower pole renal calculus. 7. 3.2 cm infrarenal abdominal aortic aneurysm. Recommend follow-up every 3 years. Reference: J Am Coll Radiol 9847;30:856-943.  Aortic Atherosclerosis (ICD10-I70.0).   Electronically Signed   By: Zetta Bills M.D.   On: 11/24/2021 18:58  # Currently on B-RAF V600E inhibitor [discon-dabrafenib plus Mekinist;Oct, 2022- sec to pancreatitis]currently on started on 7th NOV, 2022- Enco+Bini;    #For now continue Enco+Bini--continue current therapy.STABLE.  Tolerating well without any major side effects.  We will repeat a CAT scan in 2 months.  Ordered today.  #? Seizures-DEC 2022 [Dr.vaslow]--currently on Vimpat.  Tolerating well.  No side effects noted.   MRI Aug 08, 2021- STABLE s/p-Dr.Vaslow.  Stable  # right temple BCC s/p excision [Dr.Dasher q6M]-monitor closely on B-raf inhibitors.  # Brain metastases: s/p WBRT [dec 2021]; DEC 29th, 2022-interval improvement in numerous hemorrhagic metastases in the brain, which have decreased in conspicuity. No new lesions identified. STABLE MRI August,2023- STABLE; /Dr.Vaslow. awaiting repeat in [place] 2023.   # Bone lesions-mildly symptomatic-[30 mins; tylenol]. premedicated with Tylenol at home.;  Calcium normal today. Given patient's poor tolerance [myalgias/fatigue over 1 to 2 days postinfusion]-I think is reasonable to switch  to every 4 months. STABLE.  # Fatigue: Likely secondary to whole brain radiation.  Okay to give pulmonary parking pass.  [mon/-wed appt]   Zometa q 72m[09/23] DISPOSITION:  #  zometa today # follow up 2  months MD; labs- cbc/cmp; Vit D- 25 OH levels;  Dr.B

## 2021-12-05 NOTE — Progress Notes (Signed)
[place] NOTE  Patient Care Team: [name], MD as PCP - General (Family Medicine) [name], MD as Consulting Physician (Family Medicine) [name], [name], MD (General Surgery) [place] Park, DO as Consulting Physician (Radiation Oncology) Cammie Sickle, MD as Consulting Physician (Oncology)  CHIEF COMPLAINTS/PURPOSE OF CONSULTATION: Lung cancer  #  Oncology History Overview Note  # OCT 2018- STAGE I- A. 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  - PARENCHYMAL MARGIN APPEARS CLEAR; TUMOR WAS 1 CM FROM MARGIN BEFORE REMOVAL OF STAPLE LINE.; NO adjuvant therapy.   # DEC 2021-- METASTATIC CARCINOMA, COMPATIBLE WITH PULMONARY ADENOCARCINOMA [left shoulder Core Biopsy];  NOV 24th PET-right hilar and Hypermetabolic mediastinal lymphadenopathy; small pulmonary nodules; 2 metastatic lesions in the liver/  pancreatic head/left adrenal gland;  muscle and skeletal metastasis.  Subcutaneous nodule-positive for metastatic adeno lung.   # DEC 2021-Brain MRI multiple subcentimeter enhancing brain mets no significant edema - NO SYMPTOMS;s/p RT eval [Dr.Blackburn/Crystal-s/p WBRT- JAN 11th, 2022]  # JAN 14th, 2021-START DABRFENIB +MEKINIST [name] 2022- MUGA scan-EF-58%]; STOPPED MID OCT 2022- sec to acute pancreatitis  # NOV 7th, 2022- Enco+Bini.   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  Short course of Eliquis.smoking-quit 2008.  ---------------------------------------    DIAGNOSIS: LUNG CA  STAGE:  IV      ;GOALS:palliation     Primary cancer of right upper lobe of lung ([place])  04/09/2020 Cancer Staging   Staging form: Lung, AJCC 8th Edition - Clinical: Stage IVB (cN1, cM1c) - Signed by Cammie Sickle, MD on 04/09/2020      HISTORY OF  PRESENTING ILLNESS: Ambulating by independently.  Accompanied by his wife.    [name] 76 y.o.  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  Continues to complain of fatigue.  No further seizures.  No headaches.  No nausea vomiting.  Denies any abdominal pain.   Review of Systems  Constitutional:  Positive for malaise/fatigue. Negative for chills, diaphoresis, fever and weight loss.  HENT:  Negative for nosebleeds and sore throat.   Eyes:  Negative for double vision.  Respiratory:  Negative for cough, hemoptysis, sputum production, shortness of breath and wheezing.   Cardiovascular:  Negative for chest pain, palpitations, orthopnea and leg swelling.  Gastrointestinal:  Negative for abdominal pain, blood in stool, constipation, diarrhea, heartburn, melena, nausea and vomiting.  Genitourinary:  Negative for dysuria, frequency and urgency.  Musculoskeletal:  Positive for back pain and joint pain.  Skin: Negative.  Negative for itching and rash.  Neurological:  Positive for weakness. Negative for dizziness, tingling, focal weakness and headaches.  Endo/Heme/Allergies:  Does not bruise/bleed easily.  Psychiatric/Behavioral:  Positive for memory loss. Negative for depression. The patient is not nervous/anxious and does not have insomnia.      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 Baruch Gouty and Dr. Nadeen Landau   Phlebitis 1998   Primary  cancer of right upper lobe of lung ([place]) 01/14/2017   Dr. Genevive Bi performed a wedge resection of RUL.    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/27/2016     SURGICAL HISTORY: Past Surgical History:  Procedure Laterality Date   COLONOSCOPY  2013   Dr Vira Agar   COLONOSCOPY WITH PROPOFOL N/A 12/23/2016   Procedure: COLONOSCOPY WITH PROPOFOL;  Surgeon: Robert Bellow, MD;  Location[facility] ENDOSCOPY;  Service: Endoscopy;  Laterality: N/A;   EYE SURGERY     HERNIA REPAIR Right 1990's   TENDON REPAIR Left 03/14/2019   Procedure: TENDON REPAIR AND GRAFT  TRANSFER EXTENSOR INDICIS PROPRIUS LEFT THUMB;  Surgeon: [name], MD;  Location: [place];  Service: Orthopedics;  Laterality: Left;  AXILLARY BLOCK   THORACOTOMY Right 01/14/2017   Procedure: RIGHT THORACOSCOPYWITH WIDE WEDGE RESECTION,  PREOP BROCHOSCOPY;  Surgeon: [name], MD;  Location: [facility] ORS;  Service: General;  Laterality: Right;   THROAT SURGERY  2010    SOCIAL HISTORY: Social History   Socioeconomic History   Marital status: Married    Spouse name: Not on file   Number of children: Not on file   Years of education: Not on file   Highest education level: Not on file  Occupational History   Not on file  Tobacco Use   Smoking status: Former    Years: 50.00    Types: Cigarettes    Quit date: 03/31/2007    Years since quitting: 14.6   Smokeless tobacco: Never  Vaping Use   Vaping Use: Never used  Substance and Sexual Activity   Alcohol use: No   Drug use: No   Sexual activity: Yes    Birth control/protection: None    Comment: Married  Other Topics Concern   Not on file  Social History Narrative   Not on file   Social Determinants of Health   Financial Resource Strain: Not on file  Food Insecurity: Not on file  Transportation Needs: Not on file  Physical Activity: Not on file  Stress: Not on file  Social Connections: Not on file  Intimate Partner Violence: Not on file    FAMILY HISTORY: Family History  Problem Relation Age of Onset   Alzheimer's disease Mother    Alzheimer's disease Father    Stroke Father    Colon cancer Neg  Hx     ALLERGIES:  has No Known Allergies.  MEDICATIONS:  Current Outpatient Medications  Medication Sig Dispense Refill   Ascorbic Acid (VITAMIN C) 1000 MG tablet Take 1,000 mg by mouth daily.     aspirin EC 81 MG tablet Take 81 mg by mouth daily.     binimetinib (MEKTOVI) 15 MG tablet Take 3 tablets (45 mg total) by mouth 2 (two) times daily. 180 tablet 3   Calcium Carbonate-Vit D-Min (CALCIUM 1200 PO) Take 1 tablet by mouth daily.     encorafenib (BRAFTOVI) 75 MG capsule Take 6 capsules (450 mg total) by mouth daily. 299 capsule 3   folic acid (FOLVITE) 371 MCG tablet Take 800 mcg by mouth daily.     lamoTRIgine (LAMICTAL) 25 MG tablet Take 2 tablets (50 mg total) by mouth daily. 180 tablet 1   Multiple Vitamin (MULTIVITAMIN WITH MINERALS) TABS tablet Take 1 tablet by mouth daily. Senior Multivitamin.     Multiple Vitamins-Minerals (EMERGEN-C IMMUNE PO) Take 1 tablet by mouth 2 (two) times daily.     Omega-3 1000 MG CAPS Take 1 tablet by mouth 1 day or 1 dose.  omeprazole (PRILOSEC) 40 MG capsule Take by mouth.     saw palmetto 80 MG capsule Take 80 mg by mouth daily.     No current facility-administered medications for this visit.      Marland Kitchen  PHYSICAL EXAMINATION: ECOG PERFORMANCE STATUS: 0 - Asymptomatic  There were no vitals filed for this visit.   There were no vitals filed for this visit.    Physical Exam HENT:     Head: Normocephalic and atraumatic.     Mouth/Throat:     Pharynx: No oropharyngeal exudate.  Eyes:     Pupils: Pupils are equal, round, and reactive to light.  Cardiovascular:     Rate and Rhythm: Normal rate and regular rhythm.  Pulmonary:     Effort: Pulmonary effort is normal. No respiratory distress.     Breath sounds: Normal breath sounds. No wheezing.  Abdominal:     General: Bowel sounds are normal. There is no distension.     Palpations: Abdomen is soft. There is no mass.     Tenderness: There is no abdominal tenderness. There is no  guarding or rebound.  Musculoskeletal:        General: No tenderness. Normal range of motion.     Cervical back: Normal range of motion and neck supple.  Skin:    General: Skin is warm.  Neurological:     Mental Status: He is alert and oriented to person, place, and time.  Psychiatric:        Mood and Affect: Affect normal.   ;  LABORATORY DATA:  I have reviewed the data as listed Lab Results  Component Value Date   WBC 6.0 10/03/2021   HGB 14.6 10/03/2021   HCT 44.3 10/03/2021   MCV 99.1 10/03/2021   PLT 164 10/03/2021   Recent Labs    07/31/21 1047 08/29/21 0900 10/03/21 1419 11/24/21 1115  NA 138 138 136  --   K 4.8 5.1 4.6  --   CL 102 101 101  --   CO2 _0 --   GLUCOSE 122* 121* 115*  --   BUN _1 --   CREATININE 1.26* 1.23 1.16 1.10  CALCIUM 9.4 9.2 8.9  --   GFRNONAA 59* >60 >60  --   PROT 7.2 7.6 7.2  --   ALBUMIN 3.9 3.8 3.8  --   AST 34 28 26  --   ALT _2 --   ALKPHOS 40 35* 43  --   BILITOT 0.7 0.4 0.6  --     RADIOGRAPHIC STUDIES: I have personally reviewed the radiological images as listed and agreed with the findings in the report. CT CHEST ABDOMEN PELVIS W CONTRAST  Result Date: 11/24/2021 CLINICAL DATA:  Metastatic non-small cell lung cancer in a 76 year old male. * Tracking Code: BO * EXAM: CT CHEST, ABDOMEN, AND PELVIS WITH CONTRAST TECHNIQUE: Multidetector CT imaging of the chest, abdomen and pelvis was performed following the standard protocol during bolus administration of intravenous contrast. RADIATION DOSE REDUCTION: This exam was performed according to the departmental dose-optimization program which includes automated exposure control, adjustment of the mA and/or kV according to patient size and/or use of iterative reconstruction technique. CONTRAST:  169m OMNIPAQUE IOHEXOL 300 MG/ML  SOLN COMPARISON:  Jul 29, 2021 FINDINGS: CT CHEST FINDINGS Cardiovascular: Calcified and noncalcified atheromatous plaque of the thoracic  aorta without aneurysmal dilation. Normal heart size. No substantial pericardial effusion or nodularity. Normal caliber of central pulmonary  vessels. Mediastinum/Nodes: No adenopathy in the chest.  Normal esophagus. Lungs/Pleura: Post partial lung resection in the RIGHT upper lobe along the medial RIGHT upper lobe similar to previous imaging. Stable small RIGHT middle lobe pulmonary nodule 4 mm (image 89/4) airways are patent. No consolidation or pleural effusion. Musculoskeletal: See below for full musculoskeletal details. No chest wall mass. CT ABDOMEN PELVIS FINDINGS Hepatobiliary: Stable low-attenuation foci throughout the liver largest in the medial segment of the LEFT hepatic lobe (image 56/2) 11 mm. No pericholecystic stranding. Cholelithiasis as before. No biliary duct dilation. Portal vein is patent. Mild fissural widening of hepatic fissures. Pancreas: Normal, without mass, inflammation or ductal dilatation. Spleen: Normal. Adrenals/Urinary Tract: Stable appearance of bilateral adrenal thickening. No new areas of nodularity or suspicious findings of the adrenal glands. 4 mm RIGHT lower pole renal calculus. No hydronephrosis. No perivesical stranding with small urinary bladder diverticula. Stomach/Bowel: No stranding adjacent to the stomach. Small bowel without dilation or inflammation. Normal appendix. Extensive diverticular changes of the sigmoid with scattered diverticulosis of the descending colon. Vascular/Lymphatic: 3.2 cm infrarenal abdominal aortic aneurysm. Calcified and noncalcified aortic atherosclerotic plaque. Stable appearance of periportal nodal enlargement 16 mm short axis. No sign of retroperitoneal adenopathy. No pelvic lymphadenopathy. Reproductive: Unremarkable by CT aside from changes of RIGHT orchiectomy Other: No ascites Musculoskeletal: Multifocal sclerotic metastatic lesions of the spine are similar to previous imaging. Subtle sclerotic foci in the pelvis likewise unchanged.  Visualize clavicles and scapulae without acute process with unchanged RIGHT clavicular lesions. Similar appearing sclerotic sternal lesion. LEFT eleventh rib sclerosis also similarly unchanged with scattered smaller foci throughout the bilateral ribs also with sclerosis and without change. IMPRESSION: 1. Stable post partial lung resection in the RIGHT upper lobe. 2. Widespread sclerotic metastatic lesions are unchanged. 3. No new or progressive findings. 4. Stable periportal lymph node enlargement that showed no increased metabolism on prior PET imaging but warrants attention on follow-up based on size. Based on fissural widening of hepatic fissures would also correlate with any signs of liver disease that could lead to reactive lymph nodes in this location. 5. Cholelithiasis. 6. 4 mm RIGHT lower pole renal calculus. 7. 3.2 cm infrarenal abdominal aortic aneurysm. Recommend follow-up every 3 years. Reference: J Am Coll Radiol 8280;03:491-791. Aortic Atherosclerosis (ICD10-I70.0). Electronically Signed   By: Zetta Bills M.D.   On: 11/24/2021 18:58   MR BRAIN W WO CONTRAST  Result Date: 11/12/2021 CLINICAL DATA:  Stage IV lung cancer. EXAM: MRI HEAD WITHOUT AND WITH CONTRAST TECHNIQUE: Multiplanar, multiecho pulse sequences of the brain and surrounding structures were obtained without and with intravenous contrast. CONTRAST:  65m GADAVIST GADOBUTROL 1 MMOL/ML IV SOLN COMPARISON:  Brain MRI 08/10/2021. FINDINGS: Brain: Numerous foci of hemosiderin deposition scattered throughout the brain are again seen consistent with treated metastatic disease. The punctate foci of enhancement in the left parietal lobe are unchanged (18-119, 18-116). There is no new or progressive enhancement. There is no acute intracranial hemorrhage, extra-axial fluid collection, or acute infarct. Parenchymal volume is stable. The ventricles are stable in size. Confluent FLAIR signal abnormality throughout the supratentorial white matter is  unchanged. There is no mass effect or midline shift. Vascular: Normal flow voids. Skull and upper cervical spine: Normal marrow signal. Sinuses/Orbits: The paranasal sinuses are clear. Bilateral lens implants are in place. The globes and orbits are otherwise unremarkable. Other: A small right mastoid effusion is unchanged. IMPRESSION: Stable exam with no evidence of recurrent disease. Electronically Signed   By: PCourt JoyD.  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 IMPRESSION: 1. Stable post partial lung resection in the RIGHT upper lobe. 2. Widespread sclerotic metastatic lesions are unchanged. 3. No new or progressive findings. 4. Stable periportal lymph node enlargement that showed no increased metabolism on prior PET imaging but warrants attention on follow-up based on size. Based on fissural widening of hepatic fissures would also correlate with any signs of liver disease that could lead to reactive lymph nodes in this location. 5. Cholelithiasis. 6. 4 mm RIGHT lower pole renal calculus. 7. 3.2 cm infrarenal abdominal aortic aneurysm. Recommend follow-up every 3 years. Reference: J Am Coll Radiol 3817;71:165-790.   Aortic Atherosclerosis (ICD10-I70.0).     Electronically Signed   By: Zetta Bills M.D.   On: 11/24/2021 18:58    #For now continue Enco+Bini--continue current therapy.STABLE.  Tolerating  well without any major side effects.  We will repeat a CAT scan in 2 months.  Ordered today.  #? Seizures-DEC 2022 [Dr.vaslow]--currently on Vimpat.  Tolerating well.  No side effects noted.   MRI Aug 08, 2021- STABLE s/p-Dr.Vaslow.  Stable  # right temple BCC s/p excision [Dr.Dasher q6M]-monitor closely on B-raf inhibitors.  # Brain metastases: s/p WBRT [dec 2021]; DEC 29th, 2022-interval improvement in numerous hemorrhagic metastases in the brain, which have decreased in conspicuity. No new lesions identified.  Stable.  Await repeat MRI in August /Dr.Vaslow. see above.  # Bone lesions-mildly symptomatic-[30 mins; tylenol]. premedicated with Tylenol at home.;  Calcium normal today. Given patient's poor tolerance [myalgias/fatigue over 1 to 2 days postinfusion]-I think is reasonable to switch to every 4 months. STABLE.  # Fatigue: Likely secondary to whole brain radiation.  Okay to give pulmonary parking pass.   Zometa q 4 [09/23]  [mon/-wed appt]   DISPOSITION:  # HOLD zometa today # follow up  2 months MD; labs- cbc/cmp;  Zometa today- 30 mins;  CT CAP-Dr.B    All questions were answered. The patient knows to call the clinic with any problems, questions or concerns.    Cammie Sickle, MD 12/05/2021 1:08 PM

## 2021-12-11 DIAGNOSIS — D692 Other nonthrombocytopenic purpura: Secondary | ICD-10-CM | POA: Diagnosis not present

## 2021-12-11 DIAGNOSIS — D2272 Melanocytic nevi of left lower limb, including hip: Secondary | ICD-10-CM | POA: Diagnosis not present

## 2021-12-11 DIAGNOSIS — X32XXXA Exposure to sunlight, initial encounter: Secondary | ICD-10-CM | POA: Diagnosis not present

## 2021-12-11 DIAGNOSIS — D2261 Melanocytic nevi of right upper limb, including shoulder: Secondary | ICD-10-CM | POA: Diagnosis not present

## 2021-12-11 DIAGNOSIS — Z85828 Personal history of other malignant neoplasm of skin: Secondary | ICD-10-CM | POA: Diagnosis not present

## 2021-12-11 DIAGNOSIS — D2262 Melanocytic nevi of left upper limb, including shoulder: Secondary | ICD-10-CM | POA: Diagnosis not present

## 2021-12-11 DIAGNOSIS — L570 Actinic keratosis: Secondary | ICD-10-CM | POA: Diagnosis not present

## 2021-12-24 ENCOUNTER — Telehealth: Payer: Self-pay | Admitting: *Deleted

## 2021-12-24 NOTE — Telephone Encounter (Signed)
Patient called stating that he had his flu and Pneumonia vaccine this week and is asking how long he should wait to get his RSV and COVID vaccines. Please advise

## 2021-12-24 NOTE — Telephone Encounter (Signed)
Call returned to patient and advised to check with his PCP or call Pharmacy for recommendation on spacing of these vaccines. He agreed to do so

## 2021-12-24 NOTE — Telephone Encounter (Signed)
Patient should contact PCP or location that provided vaccines.  They should know the schedule/recommendations of vaccines.

## 2022-01-13 ENCOUNTER — Telehealth: Payer: Self-pay | Admitting: *Deleted

## 2022-01-13 NOTE — Telephone Encounter (Signed)
Patient called stating that he received a jury duty summons and is asking if Dr B would provide a note explaining his medical condition and age so that he can be excused

## 2022-01-15 ENCOUNTER — Encounter: Payer: Self-pay | Admitting: *Deleted

## 2022-01-15 ENCOUNTER — Encounter: Payer: Self-pay | Admitting: Internal Medicine

## 2022-01-19 NOTE — Telephone Encounter (Signed)
Letter has been signed and pt is aware letter is downstairs for pick up

## 2022-01-20 ENCOUNTER — Other Ambulatory Visit (HOSPITAL_COMMUNITY): Payer: Self-pay

## 2022-01-20 ENCOUNTER — Telehealth: Payer: Self-pay

## 2022-01-20 NOTE — Telephone Encounter (Signed)
[name] Encounter   Received notification that patient is due for re-enrollment for assistance for Braftovi and Mektovi through [employer].   Re-enrollment process has been initiated and will be submitted upon completion of necessary documents.  [employer]'s phone number [phone].   I will continue to follow until final determination.  Berdine Addison, [facility] Patient [place]  [phone] (phone) [phone] (fax) 01/20/2022 12:35 PM

## 2022-01-20 NOTE — Telephone Encounter (Signed)
[name] Encounter   Submitted application for assistance for Braftovi and Mektovi to [employer].   Application submitted via e-fax to [phone]   [employer]'s phone number [phone].   I will continue to check the status until final determination.   Berdine Addison, [facility] Patient [place]  [phone] (phone) [phone] (fax) 01/20/2022 1:29 PM

## 2022-02-04 ENCOUNTER — Inpatient Hospital Stay: Payer: PPO | Attending: Internal Medicine

## 2022-02-04 ENCOUNTER — Ambulatory Visit: Payer: PPO | Admitting: Internal Medicine

## 2022-02-04 ENCOUNTER — Inpatient Hospital Stay: Payer: PPO | Admitting: Oncology

## 2022-02-04 ENCOUNTER — Inpatient Hospital Stay: Payer: PPO

## 2022-02-04 ENCOUNTER — Encounter: Payer: Self-pay | Admitting: Oncology

## 2022-02-04 DIAGNOSIS — C7931 Secondary malignant neoplasm of brain: Secondary | ICD-10-CM | POA: Diagnosis not present

## 2022-02-04 DIAGNOSIS — C3411 Malignant neoplasm of upper lobe, right bronchus or lung: Secondary | ICD-10-CM | POA: Insufficient documentation

## 2022-02-04 DIAGNOSIS — C7951 Secondary malignant neoplasm of bone: Secondary | ICD-10-CM | POA: Insufficient documentation

## 2022-02-04 DIAGNOSIS — Z923 Personal history of irradiation: Secondary | ICD-10-CM | POA: Diagnosis not present

## 2022-02-04 DIAGNOSIS — Z87891 Personal history of nicotine dependence: Secondary | ICD-10-CM | POA: Insufficient documentation

## 2022-02-04 DIAGNOSIS — Z79899 Other long term (current) drug therapy: Secondary | ICD-10-CM | POA: Diagnosis not present

## 2022-02-04 DIAGNOSIS — Z8521 Personal history of malignant neoplasm of larynx: Secondary | ICD-10-CM | POA: Diagnosis not present

## 2022-02-04 LAB — COMPREHENSIVE METABOLIC PANEL
ALT: 17 U/L (ref 0–44)
AST: 27 U/L (ref 15–41)
Albumin: 3.9 g/dL (ref 3.5–5.0)
Alkaline Phosphatase: 39 U/L (ref 38–126)
Anion gap: 8 (ref 5–15)
BUN: 16 mg/dL (ref 8–23)
CO2: 26 mmol/L (ref 22–32)
Calcium: 9.1 mg/dL (ref 8.9–10.3)
Chloride: 104 mmol/L (ref 98–111)
Creatinine, Ser: 1.09 mg/dL (ref 0.61–1.24)
GFR, Estimated: >60 mL/min (ref >60)
Glucose, Bld: 107 mg/dL — ABNORMAL HIGH (ref 70–99)
Potassium: 4 mmol/L (ref 3.5–5.1)
Sodium: 138 mmol/L (ref 135–145)
Total Bilirubin: 0.6 mg/dL (ref 0.3–1.2)
Total Protein: 7.2 g/dL (ref 6.5–8.1)

## 2022-02-04 LAB — CBC WITH DIFFERENTIAL/PLATELET
Abs Immature Granulocytes: 0.03 10*3/uL (ref 0.00–0.07)
Basophils Absolute: 0.1 10*3/uL (ref 0.0–0.1)
Basophils Relative: 1 %
Eosinophils Absolute: 0.3 10*3/uL (ref 0.0–0.5)
Eosinophils Relative: 5 %
HCT: 43.6 % (ref 39.0–52.0)
Hemoglobin: 14.2 g/dL (ref 13.0–17.0)
Immature Granulocytes: 1 %
Lymphocytes Relative: 23 %
Lymphs Abs: 1.4 10*3/uL (ref 0.7–4.0)
MCH: 32.4 pg (ref 26.0–34.0)
MCHC: 32.6 g/dL (ref 30.0–36.0)
MCV: 99.5 fL (ref 80.0–100.0)
Monocytes Absolute: 0.5 10*3/uL (ref 0.1–1.0)
Monocytes Relative: 8 %
Neutro Abs: 3.8 10*3/uL (ref 1.7–7.7)
Neutrophils Relative %: 62 %
Platelets: 161 10*3/uL (ref 150–400)
RBC: 4.38 MIL/uL (ref 4.22–5.81)
RDW: 14.5 % (ref 11.5–15.5)
WBC: 6.1 10*3/uL (ref 4.0–10.5)
nRBC: 0 % (ref 0.0–0.2)

## 2022-02-04 LAB — VITAMIN D 25 HYDROXY (VIT D DEFICIENCY, FRACTURES): Vit D, 25-Hydroxy: 18.71 ng/mL — ABNORMAL LOW (ref 30–100)

## 2022-02-04 MED ORDER — ZOLEDRONIC ACID 4 MG/100ML IV SOLN
4.0000 mg | Freq: Once | INTRAVENOUS | Status: AC
  Administered 2022-02-04: 4 mg via INTRAVENOUS
  Filled 2022-02-04: qty 100

## 2022-02-04 MED ORDER — SODIUM CHLORIDE 0.9 % IV SOLN
Freq: Once | INTRAVENOUS | Status: AC
  Filled 2022-02-04: qty 250

## 2022-02-04 NOTE — Progress Notes (Signed)
Pt doing well. Here for Zometa today.

## 2022-02-05 ENCOUNTER — Encounter: Payer: Self-pay | Admitting: Internal Medicine

## 2022-02-05 NOTE — Progress Notes (Signed)
[place]  [phone] [phone]  ID: [name] 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  INTERVAL HISTORY: Patient returns to clinic today for repeat laboratory work, further evaluation, and continuation of Zometa.  He continues his oral chemotherapy without significant side effects.  He currently feels well and is asymptomatic.  He has no neurologic complaints.  He denies any recent fevers or illnesses.  He has a good appetite and denies weight loss.  He has no chest pain, shortness of breath, cough, or hemoptysis.  He denies any nausea, vomiting, constipation, or diarrhea.  He has no urinary complaints.  Patient offers no specific complaints today.  REVIEW OF SYSTEMS:   Review of Systems  Constitutional: Negative.  Negative for fever, malaise/fatigue and weight loss.  Respiratory: Negative.  Negative for cough, hemoptysis and shortness of breath.   Cardiovascular: Negative.  Negative for chest pain and leg swelling.  Gastrointestinal: Negative.  Negative for abdominal pain.  Genitourinary: Negative.  Negative for dysuria.  Musculoskeletal: Negative.  Negative for back pain.  Skin: Negative.  Negative for rash.  Neurological: Negative.  Negative for dizziness, focal weakness, weakness and headaches.  Psychiatric/Behavioral: Negative.  The patient is not nervous/anxious.     As per HPI. Otherwise, a complete review of systems is negative.  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 Baruch Gouty and Dr. 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 Genevive Bi performed a wedge resection of RUL.    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/27/2016    PAST SURGICAL HISTORY: Past Surgical History:  Procedure Laterality Date   COLONOSCOPY  2013   Dr Vira Agar   COLONOSCOPY WITH PROPOFOL N/A 12/23/2016   Procedure: COLONOSCOPY WITH PROPOFOL;  Surgeon: Robert Bellow, MD;  Location: [facility] ENDOSCOPY;  Service: Endoscopy;  Laterality: N/A;   EYE SURGERY     HERNIA REPAIR Right 1990's   TENDON REPAIR Left 03/14/2019   Procedure: TENDON REPAIR AND GRAFT  TRANSFER EXTENSOR INDICIS PROPRIUS LEFT THUMB;  Surgeon: [name], MD;  Location: [facility];  Service: Orthopedics;  Laterality: Left;  AXILLARY BLOCK   THORACOTOMY Right 01/14/2017   Procedure: RIGHT THORACOSCOPYWITH WIDE WEDGE RESECTION,  PREOP BROCHOSCOPY;  Surgeon: [name], MD;  Location: [facility] ORS;  Service: General;  Laterality: Right;   THROAT SURGERY  2010    FAMILY HISTORY: Family History  Problem Relation Age of Onset   Alzheimer's disease Mother    Alzheimer's disease Father    Stroke Father    Colon cancer Neg Hx     ADVANCED DIRECTIVES (Y/N):  N  HEALTH MAINTENANCE: Social History   Tobacco Use   Smoking status: Former    Years: 50.00    Types: Cigarettes    Quit date: 03/31/2007    Years since quitting:  14.8   Smokeless tobacco: Never  Vaping Use   Vaping Use: Never used  Substance Use Topics   Alcohol use: No   Drug use: No     Colonoscopy:  PAP:  Bone density:  Lipid panel:  No Known Allergies  Current Outpatient Medications  Medication Sig Dispense Refill   Ascorbic Acid (VITAMIN C) 1000 MG tablet Take 1,000 mg by mouth daily.      aspirin EC 81 MG tablet Take 81 mg by mouth daily.     binimetinib (MEKTOVI) 15 MG tablet Take 3 tablets (45 mg total) by mouth 2 (two) times daily. 180 tablet 3   Calcium Carbonate-Vit D-Min (CALCIUM 1200 PO) Take 1 tablet by mouth daily.     encorafenib (BRAFTOVI) 75 MG capsule Take 6 capsules (450 mg total) by mouth daily. 563 capsule 3   folic acid (FOLVITE) 875 MCG tablet Take 800 mcg by mouth daily.     lamoTRIgine (LAMICTAL) 25 MG tablet Take 2 tablets (50 mg total) by mouth daily. 180 tablet 1   Multiple Vitamin (MULTIVITAMIN WITH MINERALS) TABS tablet Take 1 tablet by mouth daily. Senior Multivitamin.     Multiple Vitamins-Minerals (EMERGEN-C IMMUNE PO) Take 1 tablet by mouth 2 (two) times daily.     Omega-3 1000 MG CAPS Take 1 tablet by mouth 1 day or 1 dose.     omeprazole (PRILOSEC) 40 MG capsule Take by mouth.     saw palmetto 80 MG capsule Take 80 mg by mouth daily.     No current facility-administered medications for this visit.    OBJECTIVE: There were no vitals filed for this visit.   There is no height or weight on file to calculate BMI.    ECOG FS:0 - Asymptomatic  General: Well-developed, well-nourished, no acute distress. Eyes: Pink conjunctiva, anicteric sclera. HEENT: Normocephalic, moist mucous membranes. Lungs: No audible wheezing or coughing. Heart: Regular rate and rhythm. Abdomen: Soft, nontender, no obvious distention. Musculoskeletal: No edema, cyanosis, or clubbing. Neuro: Alert, answering all questions appropriately. Cranial nerves grossly intact. Skin: No rashes or petechiae noted. Psych: Normal affect. Lymphatics: No cervical, calvicular, axillary or inguinal LAD.   LAB RESULTS:  Lab Results  Component Value Date   NA 138 02/04/2022   K 4.0 02/04/2022   CL 104 02/04/2022   CO2 26 02/04/2022   GLUCOSE 107 (H) 02/04/2022   BUN 16 02/04/2022   CREATININE 1.09 02/04/2022   CALCIUM 9.1 02/04/2022   PROT 7.2 02/04/2022   ALBUMIN 3.9  02/04/2022   AST 27 02/04/2022   ALT 17 02/04/2022   ALKPHOS 39 02/04/2022   BILITOT 0.6 02/04/2022   GFRNONAA >60 02/04/2022   GFRAA >60 02/10/2019    Lab Results  Component Value Date   WBC 6.1 02/04/2022   NEUTROABS 3.8 02/04/2022   HGB 14.2 02/04/2022   HCT 43.6 02/04/2022   MCV 99.5 02/04/2022   PLT 161 02/04/2022     STUDIES: No results found.  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  Patient's last imaging on November 24, 2021 reviewed independently with essentially stable disease.  Patient will require repeat imaging in the near future.  Patient has significant side effects with Zometa, but will proceed with treatment today.  Patient has brain imaging scheduled for February 24, 2022, therefore will attempt to get CT imaging around the  same time.  We will arrange follow-up on February 27, 2022 coincide with neuro oncology visit. Brain metastasis: Status post XRT.  Imaging and neurooncology as above. Bone pain with Zometa: Patient states he premedicated today with Tylenol.  Proceed with treatment as above.  I spent a total of 30 minutes reviewing chart data, face-to-face evaluation with the patient, counseling and coordination of care as detailed above.    Patient expressed understanding and was in agreement with this plan. He also understands that He can call clinic at any time with any questions, concerns, or complaints.    Cancer Staging  Primary cancer of right upper lobe of lung ([place]) Staging form: Lung, AJCC 8th Edition - Clinical: Stage IVB (cN1, cM1c) - Signed by Cammie Sickle, MD on 04/09/2020 Stage prefix: Recurrence   Lloyd Huger, MD   02/05/2022 1:12 PM

## 2022-02-17 ENCOUNTER — Other Ambulatory Visit: Payer: Self-pay | Admitting: Internal Medicine

## 2022-02-18 ENCOUNTER — Ambulatory Visit
Admission: RE | Admit: 2022-02-18 | Discharge: 2022-02-18 | Disposition: A | Discharge status: Home / Self Care | Payer: PPO | Source: Ambulatory Visit | Attending: Oncology | Admitting: Oncology

## 2022-02-18 DIAGNOSIS — K5730 Diverticulosis of large intestine without perforation or abscess without bleeding: Secondary | ICD-10-CM | POA: Diagnosis not present

## 2022-02-18 DIAGNOSIS — C3411 Malignant neoplasm of upper lobe, right bronchus or lung: Secondary | ICD-10-CM | POA: Diagnosis not present

## 2022-02-18 DIAGNOSIS — N200 Calculus of kidney: Secondary | ICD-10-CM | POA: Diagnosis not present

## 2022-02-18 DIAGNOSIS — J439 Emphysema, unspecified: Secondary | ICD-10-CM | POA: Diagnosis not present

## 2022-02-18 DIAGNOSIS — K8020 Calculus of gallbladder without cholecystitis without obstruction: Secondary | ICD-10-CM | POA: Diagnosis not present

## 2022-02-18 DIAGNOSIS — I700 Atherosclerosis of aorta: Secondary | ICD-10-CM | POA: Diagnosis not present

## 2022-02-18 DIAGNOSIS — C7951 Secondary malignant neoplasm of bone: Secondary | ICD-10-CM | POA: Diagnosis not present

## 2022-02-18 DIAGNOSIS — C3490 Malignant neoplasm of unspecified part of unspecified bronchus or lung: Secondary | ICD-10-CM | POA: Diagnosis not present

## 2022-02-18 MED ORDER — IOHEXOL 300 MG/ML  SOLN
100.0000 mL | Freq: Once | INTRAMUSCULAR | Status: AC | PRN
  Administered 2022-02-18: 100 mL via INTRAVENOUS

## 2022-02-24 ENCOUNTER — Ambulatory Visit
Admission: RE | Admit: 2022-02-24 | Discharge: 2022-02-24 | Disposition: A | Discharge status: Home / Self Care | Payer: PPO | Source: Ambulatory Visit | Attending: Internal Medicine | Admitting: Internal Medicine

## 2022-02-24 DIAGNOSIS — C7931 Secondary malignant neoplasm of brain: Secondary | ICD-10-CM | POA: Insufficient documentation

## 2022-02-24 DIAGNOSIS — G319 Degenerative disease of nervous system, unspecified: Secondary | ICD-10-CM | POA: Diagnosis not present

## 2022-02-24 MED ORDER — GADOBUTROL 1 MMOL/ML IV SOLN
9.0000 mL | Freq: Once | INTRAVENOUS | Status: AC | PRN
Start: 2022-02-24 — End: 2022-02-24
  Administered 2022-02-24: 9 mL via INTRAVENOUS

## 2022-02-27 ENCOUNTER — Inpatient Hospital Stay: Payer: PPO | Attending: Internal Medicine | Admitting: Internal Medicine

## 2022-02-27 ENCOUNTER — Inpatient Hospital Stay (HOSPITAL_BASED_OUTPATIENT_CLINIC_OR_DEPARTMENT_OTHER): Payer: PPO | Admitting: Internal Medicine

## 2022-02-27 ENCOUNTER — Encounter: Payer: Self-pay | Admitting: Internal Medicine

## 2022-02-27 VITALS — BP 142/81 | HR 74 | Temp 96.6°F | Resp 20 | Wt 208.2 lb

## 2022-02-27 DIAGNOSIS — C7972 Secondary malignant neoplasm of left adrenal gland: Secondary | ICD-10-CM | POA: Diagnosis not present

## 2022-02-27 DIAGNOSIS — Z79899 Other long term (current) drug therapy: Secondary | ICD-10-CM | POA: Insufficient documentation

## 2022-02-27 DIAGNOSIS — C787 Secondary malignant neoplasm of liver and intrahepatic bile duct: Secondary | ICD-10-CM | POA: Diagnosis not present

## 2022-02-27 DIAGNOSIS — Z87891 Personal history of nicotine dependence: Secondary | ICD-10-CM | POA: Diagnosis not present

## 2022-02-27 DIAGNOSIS — C3411 Malignant neoplasm of upper lobe, right bronchus or lung: Secondary | ICD-10-CM | POA: Diagnosis not present

## 2022-02-27 DIAGNOSIS — Z923 Personal history of irradiation: Secondary | ICD-10-CM | POA: Diagnosis not present

## 2022-02-27 DIAGNOSIS — Z7982 Long term (current) use of aspirin: Secondary | ICD-10-CM | POA: Insufficient documentation

## 2022-02-27 DIAGNOSIS — C7931 Secondary malignant neoplasm of brain: Secondary | ICD-10-CM | POA: Insufficient documentation

## 2022-02-27 DIAGNOSIS — C7951 Secondary malignant neoplasm of bone: Secondary | ICD-10-CM | POA: Diagnosis not present

## 2022-02-27 DIAGNOSIS — R569 Unspecified convulsions: Secondary | ICD-10-CM | POA: Diagnosis not present

## 2022-02-27 MED ORDER — ERGOCALCIFEROL 1.25 MG (50000 UT) PO CAPS: 50000.0000 [IU] | ORAL_CAPSULE | ORAL | 1 refills | Status: DC

## 2022-02-27 NOTE — Progress Notes (Signed)
[place] NOTE  Patient Care Team: [name], MD as PCP - General (Family Medicine) [name], MD as Consulting Physician (Family Medicine) [name], [name], MD (General Surgery) Thornton Park, DO as Consulting Physician (Radiation Oncology) Cammie Sickle, MD as Consulting Physician (Oncology)  CHIEF COMPLAINTS/PURPOSE OF CONSULTATION: Lung cancer  #  Oncology History Overview Note  # OCT 2018- STAGE I- A. 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  - PARENCHYMAL MARGIN APPEARS CLEAR; TUMOR WAS 1 CM FROM MARGIN BEFORE REMOVAL OF STAPLE LINE.; NO adjuvant therapy.   # DEC 2021-- METASTATIC CARCINOMA, COMPATIBLE WITH PULMONARY ADENOCARCINOMA [left shoulder Core Biopsy];  NOV 24th PET-right hilar and Hypermetabolic mediastinal lymphadenopathy; small pulmonary nodules; 2 metastatic lesions in the liver/  pancreatic head/left adrenal gland;  muscle and skeletal metastasis.  Subcutaneous nodule-positive for metastatic adeno lung.   # DEC 2021-Brain MRI multiple subcentimeter enhancing brain mets no significant edema - NO SYMPTOMS;s/p RT eval [Dr.Blackburn/Crystal-s/p WBRT- JAN 11th, 2022]  # JAN 14th, 2021-START DABRFENIB +MEKINIST [name] 2022- MUGA scan-EF-58%]; STOPPED MID OCT 2022- sec to acute pancreatitis  # NOV 7th, 2022- Enco+Bini.   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  Short course of Eliquis.smoking-quit 2008.  ---------------------------------------    DIAGNOSIS: LUNG CA  STAGE:  IV      ;GOALS:palliation     Primary cancer of right upper lobe of lung ([place])  04/09/2020 Cancer Staging   Staging form: Lung, AJCC 8th Edition - Clinical: Stage IVB (cN1, cM1c) - Signed by Cammie Sickle, MD on 04/09/2020      HISTORY OF  PRESENTING ILLNESS: Ambulating by independently.  Accompanied by his wife.    Curtis Andrade 76 y.o.  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  In the interim patient was evaluated by Dr. Grayland Ormond approximately 3 weeks ago.  Patient has appointment with neuro-oncology today  Patient has no concerns today. Continues to complain of fatigue.  No further seizures.  No headaches.  No nausea vomiting.  Denies any abdominal pain.   Review of Systems  Constitutional:  Positive for malaise/fatigue. Negative for chills, diaphoresis, fever and weight loss.  HENT:  Negative for nosebleeds and sore throat.   Eyes:  Negative for double vision.  Respiratory:  Negative for cough, hemoptysis, sputum production, shortness of breath and wheezing.   Cardiovascular:  Negative for chest pain, palpitations, orthopnea and leg swelling.  Gastrointestinal:  Negative for abdominal pain, blood in stool, constipation, diarrhea, heartburn, melena, nausea and vomiting.  Genitourinary:  Negative for dysuria, frequency and urgency.  Musculoskeletal:  Positive for back pain and joint pain.  Skin: Negative.  Negative for itching and rash.  Neurological:  Positive for weakness. Negative for dizziness, tingling, focal weakness and headaches.  Endo/Heme/Allergies:  Does not bruise/bleed easily.  Psychiatric/Behavioral:  Positive for memory loss. Negative for depression. The patient is not nervous/anxious and does not have insomnia.      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 Baruch Gouty and  Dr. Nadeen Landau   Phlebitis 1998   Primary cancer of right upper lobe of lung ([place]) 01/14/2017   Dr. Genevive Bi performed a wedge resection of RUL.    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/27/2016    SURGICAL HISTORY: Past Surgical History:  Procedure Laterality Date   COLONOSCOPY  2013   Dr Vira Agar   COLONOSCOPY WITH PROPOFOL N/A 12/23/2016   Procedure: COLONOSCOPY WITH PROPOFOL;  Surgeon: Robert Bellow, MD;  Location: [facility] ENDOSCOPY;  Service: Endoscopy;  Laterality: N/A;   EYE SURGERY     HERNIA REPAIR Right 1990's   TENDON REPAIR Left 03/14/2019   Procedure: TENDON REPAIR AND GRAFT  TRANSFER EXTENSOR INDICIS PROPRIUS LEFT THUMB;  Surgeon: [name], MD;  Location: [place];  Service: Orthopedics;  Laterality: Left;  AXILLARY BLOCK   THORACOTOMY Right 01/14/2017   Procedure: RIGHT THORACOSCOPYWITH WIDE WEDGE RESECTION,  PREOP BROCHOSCOPY;  Surgeon: [name], MD;  Location: [facility] ORS;  Service: General;  Laterality: Right;   THROAT SURGERY  2010    SOCIAL HISTORY: Social History   Socioeconomic History   Marital status: Married    Spouse name: Not on file   Number of children: Not on file   Years of education: Not on file   Highest education level: Not on file  Occupational History   Not on file  Tobacco Use   Smoking status: Former    Years: 50.00    Types: Cigarettes    Quit date: 03/31/2007    Years since quitting: 14.9   Smokeless tobacco: Never  Vaping Use   Vaping Use: Never used  Substance and Sexual Activity   Alcohol use: No   Drug use: No   Sexual activity: Yes    Birth control/protection: None    Comment: Married  Other Topics Concern   Not on file  Social History Narrative   Not on file   Social Determinants of Health   Financial Resource Strain: Not on file  Food Insecurity: Not on file  Transportation Needs: Not on file  Physical Activity: Not on file  Stress: Not on file  Social Connections: Not on file  Intimate Partner  Violence: Not on file    FAMILY HISTORY: Family History  Problem Relation Age of Onset   Alzheimer's disease Mother    Alzheimer's disease Father    Stroke Father    Colon cancer Neg Hx     ALLERGIES:  has No Known Allergies.  MEDICATIONS:  Current Outpatient Medications  Medication Sig Dispense Refill   Ascorbic Acid (VITAMIN C) 1000 MG tablet Take 1,000 mg by mouth daily.     aspirin EC 81 MG tablet Take 81 mg by mouth daily.     binimetinib (MEKTOVI) 15 MG tablet Take 3 tablets (45 mg total) by mouth 2 (two) times daily. 180 tablet 3   Calcium Carbonate-Vit D-Min (CALCIUM 1200 PO) Take 1 tablet by mouth daily.     encorafenib (BRAFTOVI) 75 MG capsule Take 6 capsules (450 mg total) by mouth daily. 180 capsule 3   ergocalciferol (VITAMIN D2) 1.25 MG (50000 UT) capsule Take 1 capsule (50,000 Units total) by mouth once a week. 12 capsule 1   folic acid (FOLVITE) 564 MCG tablet Take 800 mcg by mouth daily.     lamoTRIgine (LAMICTAL) 25 MG tablet TAKE 2 TABLETS BY MOUTH DAILY 180 tablet 1   Multiple Vitamin (  MULTIVITAMIN WITH MINERALS) TABS tablet Take 1 tablet by mouth daily. Senior Multivitamin.     Multiple Vitamins-Minerals (EMERGEN-C IMMUNE PO) Take 1 tablet by mouth 2 (two) times daily.     Omega-3 1000 MG CAPS Take 1 tablet by mouth 1 day or 1 dose.     omeprazole (PRILOSEC) 40 MG capsule Take by mouth.     saw palmetto 80 MG capsule Take 80 mg by mouth daily.     No current facility-administered medications for this visit.      Marland Kitchen  PHYSICAL EXAMINATION: ECOG PERFORMANCE STATUS: 0 - Asymptomatic  Vitals:   02/27/22 0952  BP: (!) 142/81  Pulse: 74  Resp: 20  Temp: (!) 96.6 F (35.9 C)  SpO2: 100%     Filed Weights   02/27/22 0952  Weight: 208 lb 3.2 oz (94.4 kg)      Physical Exam HENT:     Head: Normocephalic and atraumatic.     Mouth/Throat:     Pharynx: No oropharyngeal exudate.  Eyes:     Pupils: Pupils are equal, round, and reactive to light.   Cardiovascular:     Rate and Rhythm: Normal rate and regular rhythm.  Pulmonary:     Effort: Pulmonary effort is normal. No respiratory distress.     Breath sounds: Normal breath sounds. No wheezing.  Abdominal:     General: Bowel sounds are normal. There is no distension.     Palpations: Abdomen is soft. There is no mass.     Tenderness: There is no abdominal tenderness. There is no guarding or rebound.  Musculoskeletal:        General: No tenderness. Normal range of motion.     Cervical back: Normal range of motion and neck supple.  Skin:    General: Skin is warm.  Neurological:     Mental Status: He is alert and oriented to person, place, and time.  Psychiatric:        Mood and Affect: Affect normal.   ;  LABORATORY DATA:  I have reviewed the data as listed Lab Results  Component Value Date   WBC 6.1 02/04/2022   HGB 14.2 02/04/2022   HCT 43.6 02/04/2022   MCV 99.5 02/04/2022   PLT 161 02/04/2022   Recent Labs    10/03/21 1419 11/24/21 1115 12/05/21 1429 02/04/22 1017  NA 136  --  139 138  K 4.6  --  4.6 4.0  CL 101  --  105 104  CO2 29  --  28 26  GLUCOSE 115*  --  113* 107*  BUN 17  --  19 16  CREATININE 1.16 1.10 1.10 1.09  CALCIUM 8.9  --  8.7* 9.1  GFRNONAA >60  --  >60 >60  PROT 7.2  --  6.9 7.2  ALBUMIN 3.8  --  3.9 3.9  AST 26  --  26 27  ALT 21  --  19 17  ALKPHOS 43  --  43 39  BILITOT 0.6  --  0.4 0.6    RADIOGRAPHIC STUDIES: I have personally reviewed the radiological images as listed and agreed with the findings in the report. MR BRAIN W WO CONTRAST  Result Date: 02/25/2022 CLINICAL DATA:  CNS neoplasm, assess treatment response. EXAM: MRI HEAD WITHOUT AND WITH CONTRAST TECHNIQUE: Multiplanar, multiecho pulse sequences of the brain and surrounding structures were obtained without and with intravenous contrast. CONTRAST:  58m GADAVIST GADOBUTROL 1 MMOL/ML IV SOLN COMPARISON:  11/11/2021 FINDINGS: Brain: No  swelling or enhancement to suggest  metastatic disease. No acute or subacute infarction, hemorrhage, hydrocephalus, extra-axial collection or mass lesion. Confluent chronic white matter disease in the deep white matter is stable. Stable brain atrophy. Stable number of chronic microhemorrhages mainly in the posterior fossa and posterior cerebrum. Vascular: Normal flow voids. Skull and upper cervical spine: Normal marrow signal. Sinuses/Orbits: Negative. IMPRESSION: No evidence of recurrent disease. Electronically Signed   By: Jorje Guild M.D.   On: 02/25/2022 09:52   CT CHEST ABDOMEN PELVIS W CONTRAST  Result Date: 02/19/2022 CLINICAL DATA:  76 year old male presenting for evaluation of non-small cell lung cancer on follow-up imaging. * Tracking Code: BO * EXAM: CT CHEST, ABDOMEN, AND PELVIS WITH CONTRAST TECHNIQUE: Multidetector CT imaging of the chest, abdomen and pelvis was performed following the standard protocol during bolus administration of intravenous contrast. RADIATION DOSE REDUCTION: This exam was performed according to the departmental dose-optimization program which includes automated exposure control, adjustment of the mA and/or kV according to patient size and/or use of iterative reconstruction technique. CONTRAST:  11m OMNIPAQUE IOHEXOL 300 MG/ML  SOLN COMPARISON:  November 24, 2021. FINDINGS: CT CHEST FINDINGS Cardiovascular: Calcified coronary artery disease. Normal heart size. Normal caliber of the thoracic aorta with calcified and noncalcified atheromatous plaque. Normal appearance of central pulmonary vessels on venous phase. Mediastinum/Nodes: No thoracic inlet, axillary, mediastinal or hilar adenopathy. Esophagus grossly normal. Lungs/Pleura: No signs of consolidation or evidence of pleural effusion. New discrete nodule along bronchovascular structures in the anterior RIGHT middle lobe (image 82/3) 8 x 6 mm. Previously there was subtle peribronchovascular tree-in-bud nodularity in this location. Stable small RIGHT  middle lobe nodule (image 91/3) 4 mm. No additional new areas of nodularity. Signs of paraseptal emphysema which are mild and unchanged. Musculoskeletal: See below for full musculoskeletal details. CT ABDOMEN PELVIS FINDINGS Hepatobiliary: Stable low-density lesion in the anterior medial segment of the LEFT hepatic lobe (image 57/2) 11 mm. Other scattered areas of low attenuation in the liver are similarly unchanged. No pericholecystic stranding. Cholelithiasis. No biliary duct dilation. Pancreas: Mild atrophy of the pancreas without ductal dilation, inflammation or visible lesion. Spleen: Normal. Adrenals/Urinary Tract: Adrenal thickening on the LEFT with low attenuation favoring hyperplasia is unchanged. RIGHT adrenal is normal. Nephrolithiasis in the lower pole the RIGHT kidney likewise unchanged approximately 4-5 mm. No suspicious renal lesion, hydronephrosis or ureteral calculi. Urinary bladder is under distended without acute process. Stomach/Bowel: Colonic diverticulosis. Moderate to marked in the sigmoid colon. No signs of acute diverticular changes. Normal appendix. No stranding adjacent to the stomach. No sign of small bowel obstruction or inflammation. Vascular/Lymphatic: Atherosclerotic changes of the abdominal aorta. 3.2 x 2.7 cm infrarenal abdominal aortic aneurysm. Atherosclerotic changes are moderate. There is no gastrohepatic or hepatoduodenal ligament lymphadenopathy. No retroperitoneal or mesenteric lymphadenopathy. No pelvic sidewall lymphadenopathy. Reproductive: Unremarkable by CT. Other: No ascites. Musculoskeletal: Similar appearance of multifocal sclerotic bony metastases involving the spine and pelvis in particular also with involvement of RIGHT clavicle. IMPRESSION: 1. New discrete nodule along bronchovascular structures in the anterior RIGHT middle lobe. Previously there was subtle peribronchovascular tree-in-bud nodularity in this location. While findings could be related to infectious  or inflammatory process given the discrete appearance of this nodule new focus of bronchogenic neoplasm either metastatic or primary is considered. Short interval follow-up or PET imaging may be of benefit. 2. Signs of RIGHT upper lobe wedge resection as on previous imaging. 3. No signs of new bony metastases with widespread sclerotic metastatic lesions similar to previous imaging.  4. Stable low-density lesion in the anterior medial segment of the LEFT hepatic lobe. Other scattered areas of low-density in the liver are similarly unchanged. 5. Cholelithiasis. 6. Nephrolithiasis. 7. 3.2 x 2.7 cm infrarenal abdominal aortic aneurysm. Recommend follow-up every 3 years. 8. Aortic atherosclerosis. 9. Pulmonary emphysema. Aortic Atherosclerosis (ICD10-I70.0) and Emphysema (ICD10-J43.9). Electronically Signed   By: Zetta Bills M.D.   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 Other scattered areas of low-density in the liver are similarly unchanged.  # Currently on B-RAF V600E inhibitor [discon-dabrafenib plus Mekinist;Oct, 2022- sec to pancreatitis]currently on started on 7th NOV, 2022- Enco+Bini. For now continue Enco+Bini-stable; see below regarding right upper lobe nodule  # Right upper lobe lung nodule-subcentimeter in size.  Unclear infectious versus malignancy..  We will repeat a CAT scan in 2 months.  Ordered today.   #? Seizures-DEC 2022 [Dr.vaslow]--currently OFF Vimpat.  Tolerating well.  No side effects noted.   MRI NOV 2023- STABLE   # right  temple [place] s/p excision [Dr.Dasher q6M]-monitor closely on B-raf inhibitors.  # Brain metastases: s/p WBRT [dec 2021]; DEC 29th, 2022-interval improvement in numerous hemorrhagic metastases in the brain, which have decreased in conspicuity. No new lesions identified. MRI NOV 2023- STABLE; /Dr.Vaslow.   # Bone lesions-mildly symptomatic-[30 mins; tylenol]. premedicated with Tylenol at home.;  Calcium normal today. Given patient's poor tolerance [myalgias/fatigue over 1 to 2 days postinfusion]-I think is reasonable to switch to every 4 months. Low vit D- NOV 2023- start 50,K weekly.   # Fatigue: Likely secondary to whole brain radiation.  Okay to give pulmonary parking pass.  # Vaccinations: s/p flu shot- RSV- covid  [mon/-wed appt]   Zometa q 13m[nov23]  DISPOSITION:  # follow up 2  months MD; labs- cbc/cmp; CT chest prior-  Dr.B  # I reviewed the blood work- with the patient in detail; also reviewed the imaging independently [as summarized above]; and with the patient in detail.      All questions were answered. The patient knows to call the clinic with any problems, questions or concerns.    GCammie Sickle MD 02/27/2022 12:26 PM

## 2022-02-27 NOTE — Progress Notes (Signed)
Patient has no concerns today. 

## 2022-02-27 NOTE — Progress Notes (Signed)
[place] at [place], [place] [address] [phone]   Interval Evaluation  Date of Service: 02/27/22 Patient Name: Curtis Andrade. Patient MRN: [id number] Patient DOB: Jan 28, 1946 Provider: Ventura Sellers, MD  Identifying Statement:  [name]. is a 76 y.o. male with Malignant neoplasm metastatic to brain [facility])  Focal seizure ([place])   Primary Cancer:  Oncologic History: Oncology History Overview Note  # OCT 2018- STAGE I- A. 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  - PARENCHYMAL MARGIN APPEARS CLEAR; TUMOR WAS 1 CM FROM MARGIN BEFORE REMOVAL OF STAPLE LINE.; NO adjuvant therapy.   # DEC 2021-- METASTATIC CARCINOMA, COMPATIBLE WITH PULMONARY ADENOCARCINOMA [left shoulder Core Biopsy];  NOV 24th PET-right hilar and Hypermetabolic mediastinal lymphadenopathy; small pulmonary nodules; 2 metastatic lesions in the liver/  pancreatic head/left adrenal gland;  muscle and skeletal metastasis.  Subcutaneous nodule-positive for metastatic adeno lung.   # DEC 2021-Brain MRI multiple subcentimeter enhancing brain mets no significant edema - NO SYMPTOMS;s/p RT eval [Dr.Blackburn/[facility]-s/p WBRT- JAN 11th, 2022]  # JAN 14th, 2021-START DABRFENIB +MEKINIST [name] 2022- MUGA scan-EF-58%]; STOPPED MID OCT 2022- sec to acute pancreatitis  # NOV 7th, 2022- Enco+Bini.   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  Short course of Eliquis.smoking-quit 2008.  ---------------------------------------    DIAGNOSIS: LUNG CA  STAGE:  IV      ;GOALS:palliation     Primary cancer of right upper lobe of lung ([place])  04/09/2020 Cancer Staging   Staging form: Lung, AJCC 8th Edition - Clinical: Stage IVB (cN1, cM1c) - Signed by Cammie Sickle, MD on  04/09/2020    CNS Oncologic History 04/09/21: Completes WBRT for 11+ visible metastases ([place])  Interval History: Curtis Andrade. presents today for follow up after MRI brain.  No new or progressive changes today.  Continues to describe impaired short term memory compared to prior to radiation.  No further seizures, he had stopped lamictal because of excess fatigue.  Continues to follow with Dr. B for oral TKI.  H+P (04/11/21) Presents today to review recent neurologic episode.  His wife describes episode of sudden confusion, shaking, disorientation, roughly one month ago.  His "whole body was shaking", which he remembers, but shortly after that became confused and non-sensical, which improved slowly over several days.  He describes a period of almost 24 hours with "total blackout, no memory".  Since then, he has been back at his normal baseline, with no recurrence.  He completed whole brain radiation ~1 year ago with Dr. Donella Stade for brain metastases.  Continues on oral TKIs for BRAF+ lung cancer with Dr. Rogue Bussing.  Medications: Current Outpatient Medications on File Prior to Visit  Medication Sig Dispense Refill   Ascorbic Acid (VITAMIN C) 1000 MG tablet Take 1,000 mg by mouth daily.     aspirin EC 81 MG tablet Take 81 mg by mouth daily.     binimetinib (MEKTOVI) 15 MG tablet Take 3 tablets (45 mg total) by mouth 2 (two) times daily. 180 tablet 3   Calcium Carbonate-Vit D-Min (CALCIUM 1200 PO) Take 1 tablet by mouth daily.     encorafenib (BRAFTOVI) 75 MG capsule Take 6 capsules (450 mg total) by mouth daily. 675 capsule 3   folic acid (FOLVITE) 449 MCG tablet Take 800  mcg by mouth daily.     lamoTRIgine (LAMICTAL) 25 MG tablet TAKE 2 TABLETS BY MOUTH DAILY 180 tablet 1   Multiple Vitamin (MULTIVITAMIN WITH MINERALS) TABS tablet Take 1 tablet by mouth daily. Senior Multivitamin.     Multiple Vitamins-Minerals (EMERGEN-C IMMUNE PO) Take 1 tablet by mouth 2 (two) times daily.     Omega-3  1000 MG CAPS Take 1 tablet by mouth 1 day or 1 dose.     omeprazole (PRILOSEC) 40 MG capsule Take by mouth.     saw palmetto 80 MG capsule Take 80 mg by mouth daily.     No current facility-administered medications on file prior to visit.    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 Baruch Gouty and Dr. Nadeen Landau   Phlebitis 1998   Primary cancer of right upper lobe of lung ([place]) 01/14/2017   Dr. Genevive Bi performed a wedge resection of RUL.    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/27/2016   Past Surgical History:  Past Surgical History:  Procedure Laterality Date   COLONOSCOPY  2013   Dr Vira Agar   COLONOSCOPY WITH PROPOFOL N/A 12/23/2016   Procedure: COLONOSCOPY WITH PROPOFOL;  Surgeon: Robert Bellow, MD;  Location: [facility] ENDOSCOPY;  Service: Endoscopy;  Laterality: N/A;   EYE SURGERY     HERNIA REPAIR Right 1990's   TENDON REPAIR Left 03/14/2019   Procedure: TENDON REPAIR AND GRAFT  TRANSFER EXTENSOR INDICIS PROPRIUS LEFT THUMB;  Surgeon: [name], MD;  Location: [facility];  Service: Orthopedics;  Laterality: Left;  AXILLARY BLOCK   THORACOTOMY Right 01/14/2017   Procedure: RIGHT THORACOSCOPYWITH WIDE WEDGE RESECTION,  PREOP BROCHOSCOPY;  Surgeon: [name], MD;  Location: [facility] ORS;  Service: General;  Laterality: Right;   THROAT SURGERY  2010   Social History:  Social History   Socioeconomic History   Marital status: Married    Spouse name: Not on file   Number of children: Not on file   Years of education: Not on file   Highest education level: Not on file  Occupational History   Not on file  Tobacco Use   Smoking status: Former    Years: 50.00    Types: Cigarettes     Quit date: 03/31/2007    Years since quitting: 14.9   Smokeless tobacco: Never  Vaping Use   Vaping Use: Never used  Substance and Sexual Activity   Alcohol use: No   Drug use: No   Sexual activity: Yes    Birth control/protection: None    Comment: Married  Other Topics Concern   Not on file  Social History Narrative   Not on file   Social Determinants of Health   Financial Resource Strain: Not on file  Food Insecurity: Not on file  Transportation Needs: Not on file  Physical Activity: Not on file  Stress: Not on file  Social Connections: Not on file  Intimate Partner Violence: Not on file   Family History:  Family History  Problem Relation Age of Onset   Alzheimer's disease Mother    Alzheimer's disease Father    Stroke Father    Colon cancer Neg Hx  Review of Systems: Constitutional: Doesn't report fevers, chills or abnormal weight loss Eyes: Doesn't report blurriness of vision Ears, nose, mouth, throat, and face: Doesn't report sore throat Respiratory: Doesn't report cough, dyspnea or wheezes Cardiovascular: Doesn't report palpitation, chest discomfort  Gastrointestinal:  Doesn't report nausea, constipation, diarrhea GU: Doesn't report incontinence Skin: Doesn't report skin rashes Neurological: Per HPI Musculoskeletal: Doesn't report joint pain Behavioral/Psych: Doesn't report anxiety  Physical Exam: There were no vitals filed for this visit. KPS: 90. General: Alert, cooperative, pleasant, in no acute distress Head: Normal EENT: No conjunctival injection or scleral icterus.  Lungs: Resp effort normal Cardiac: Regular rate Abdomen: Non-distended abdomen Skin: No rashes cyanosis or petechiae. Extremities: No clubbing or edema  Neurologic Exam: Mental Status: Awake, alert, attentive to examiner. Oriented to self and environment. Language is fluent with intact comprehension.  Cranial Nerves: Visual acuity is grossly normal. Visual fields are full.  Extra-ocular movements intact. No ptosis. Face is symmetric Motor: Tone and bulk are normal. Power is full in both arms and legs. Reflexes are symmetric, no pathologic reflexes present.  Sensory: Intact to light touch Gait: Normal.   Labs: I have reviewed the data as listed    Component Value Date/Time   NA 138 02/04/2022 1017   K 4.0 02/04/2022 1017   CL 104 02/04/2022 1017   CO2 26 02/04/2022 1017   GLUCOSE 107 (H) 02/04/2022 1017   BUN 16 02/04/2022 1017   CREATININE 1.09 02/04/2022 1017   CALCIUM 9.1 02/04/2022 1017   PROT 7.2 02/04/2022 1017   ALBUMIN 3.9 02/04/2022 1017   AST 27 02/04/2022 1017   ALT 17 02/04/2022 1017   ALKPHOS 39 02/04/2022 1017   BILITOT 0.6 02/04/2022 1017   GFRNONAA >60 02/04/2022 1017   GFRAA >60 02/10/2019 1003   Lab Results  Component Value Date   WBC 6.1 02/04/2022   NEUTROABS 3.8 02/04/2022   HGB 14.2 02/04/2022   HCT 43.6 02/04/2022   MCV 99.5 02/04/2022   PLT 161 02/04/2022    Imaging:  [place] Clinician Interpretation: I have personally reviewed the CNS images as listed.  My interpretation, in the context of the patient's clinical presentation, is stable disease  MR BRAIN W WO CONTRAST  Result Date: 02/25/2022 CLINICAL DATA:  CNS neoplasm, assess treatment response. EXAM: MRI HEAD WITHOUT AND WITH CONTRAST TECHNIQUE: Multiplanar, multiecho pulse sequences of the brain and surrounding structures were obtained without and with intravenous contrast. CONTRAST:  70m GADAVIST GADOBUTROL 1 MMOL/ML IV SOLN COMPARISON:  11/11/2021 FINDINGS: Brain: No swelling or enhancement to suggest metastatic disease. No acute or subacute infarction, hemorrhage, hydrocephalus, extra-axial collection or mass lesion. Confluent chronic white matter disease in the deep white matter is stable. Stable brain atrophy. Stable number of chronic microhemorrhages mainly in the posterior fossa and posterior cerebrum. Vascular: Normal flow voids. Skull and upper cervical  spine: Normal marrow signal. Sinuses/Orbits: Negative. IMPRESSION: No evidence of recurrent disease. Electronically Signed   By: JJorje GuildM.D.   On: 02/25/2022 09:52   CT CHEST ABDOMEN PELVIS W CONTRAST  Result Date: 02/19/2022 CLINICAL DATA:  76year old male presenting for evaluation of non-small cell lung cancer on follow-up imaging. * Tracking Code: BO * EXAM: CT CHEST, ABDOMEN, AND PELVIS WITH CONTRAST TECHNIQUE: Multidetector CT imaging of the chest, abdomen and pelvis was performed following the standard protocol during bolus administration of intravenous contrast. RADIATION DOSE REDUCTION: This exam was performed according to the departmental dose-optimization program which includes automated exposure control, adjustment of the mA  and/or kV according to patient size and/or use of iterative reconstruction technique. CONTRAST:  144m OMNIPAQUE IOHEXOL 300 MG/ML  SOLN COMPARISON:  November 24, 2021. FINDINGS: CT CHEST FINDINGS Cardiovascular: Calcified coronary artery disease. Normal heart size. Normal caliber of the thoracic aorta with calcified and noncalcified atheromatous plaque. Normal appearance of central pulmonary vessels on venous phase. Mediastinum/Nodes: No thoracic inlet, axillary, mediastinal or hilar adenopathy. Esophagus grossly normal. Lungs/Pleura: No signs of consolidation or evidence of pleural effusion. New discrete nodule along bronchovascular structures in the anterior RIGHT middle lobe (image 82/3) 8 x 6 mm. Previously there was subtle peribronchovascular tree-in-bud nodularity in this location. Stable small RIGHT middle lobe nodule (image 91/3) 4 mm. No additional new areas of nodularity. Signs of paraseptal emphysema which are mild and unchanged. Musculoskeletal: See below for full musculoskeletal details. CT ABDOMEN PELVIS FINDINGS Hepatobiliary: Stable low-density lesion in the anterior medial segment of the LEFT hepatic lobe (image 57/2) 11 mm. Other scattered areas of low  attenuation in the liver are similarly unchanged. No pericholecystic stranding. Cholelithiasis. No biliary duct dilation. Pancreas: Mild atrophy of the pancreas without ductal dilation, inflammation or visible lesion. Spleen: Normal. Adrenals/Urinary Tract: Adrenal thickening on the LEFT with low attenuation favoring hyperplasia is unchanged. RIGHT adrenal is normal. Nephrolithiasis in the lower pole the RIGHT kidney likewise unchanged approximately 4-5 mm. No suspicious renal lesion, hydronephrosis or ureteral calculi. Urinary bladder is under distended without acute process. Stomach/Bowel: Colonic diverticulosis. Moderate to marked in the sigmoid colon. No signs of acute diverticular changes. Normal appendix. No stranding adjacent to the stomach. No sign of small bowel obstruction or inflammation. Vascular/Lymphatic: Atherosclerotic changes of the abdominal aorta. 3.2 x 2.7 cm infrarenal abdominal aortic aneurysm. Atherosclerotic changes are moderate. There is no gastrohepatic or hepatoduodenal ligament lymphadenopathy. No retroperitoneal or mesenteric lymphadenopathy. No pelvic sidewall lymphadenopathy. Reproductive: Unremarkable by CT. Other: No ascites. Musculoskeletal: Similar appearance of multifocal sclerotic bony metastases involving the spine and pelvis in particular also with involvement of RIGHT clavicle. IMPRESSION: 1. New discrete nodule along bronchovascular structures in the anterior RIGHT middle lobe. Previously there was subtle peribronchovascular tree-in-bud nodularity in this location. While findings could be related to infectious or inflammatory process given the discrete appearance of this nodule new focus of bronchogenic neoplasm either metastatic or primary is considered. Short interval follow-up or PET imaging may be of benefit. 2. Signs of RIGHT upper lobe wedge resection as on previous imaging. 3. No signs of new bony metastases with widespread sclerotic metastatic lesions similar to  previous imaging. 4. Stable low-density lesion in the anterior medial segment of the LEFT hepatic lobe. Other scattered areas of low-density in the liver are similarly unchanged. 5. Cholelithiasis. 6. Nephrolithiasis. 7. 3.2 x 2.7 cm infrarenal abdominal aortic aneurysm. Recommend follow-up every 3 years. 8. Aortic atherosclerosis. 9. Pulmonary emphysema. Aortic Atherosclerosis (ICD10-I70.0) and Emphysema (ICD10-J43.9). Electronically Signed   By: GZetta BillsM.D.   On: 02/19/2022 13:47    Assessment/Plan Malignant neoplasm metastatic to brain ([facility]  [place] ([place]  Doyel B CC[place] Graham is clinically and radiographically stable today.  Fatigue and mild cognitive effects of radiation are noted.  MRI does demonstrate notable leukomalacia as well as mild atrophy when compared to December 2022 study.  We reviewed strategies for recovering cognitive function, including exercise, diet, memory exercises.   We appreciate the opportunity to participate in the care of [place].   We ask that [place] return to clinic in 4 months following next  brain MRI, or sooner as needed.  All questions were answered. The patient knows to call the clinic with any problems, questions or concerns. No barriers to learning were detected.  The total time spent in the encounter was 30 minutes and more than 50% was on counseling and review of test results   Ventura Sellers, MD Medical Director of Neuro-Oncology [facility] at [place] 02/27/22 9:44 AM

## 2022-02-27 NOTE — Assessment & Plan Note (Addendum)
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 Other scattered areas of low-density in the liver are similarly unchanged.  # Currently on B-RAF V600E inhibitor [discon-dabrafenib plus Mekinist;Oct, 2022- sec to pancreatitis]currently on started on 7th NOV, 2022- Enco+Bini. For now continue Enco+Bini-stable; see below regarding right upper lobe nodule  # Right upper lobe lung nodule-subcentimeter in size.  Unclear infectious versus malignancy..  We will repeat a CAT scan in 2 months.  Ordered today.   #? Seizures-DEC 2022 [Dr.vaslow]--currently OFF Vimpat.  Tolerating well.  No side effects noted.   MRI NOV 2023- STABLE   # right temple [place] s/p excision [Dr.Dasher q6M]-monitor closely on B-raf inhibitors.  # Brain metastases: s/p WBRT [dec 2021]; DEC 29th, 2022-interval improvement in numerous hemorrhagic metastases in the brain, which have decreased in conspicuity. No new lesions identified. MRI NOV 2023- STABLE; /Dr.Vaslow.   # Bone lesions-mildly symptomatic-[30 mins; tylenol]. premedicated with Tylenol at home.;  Calcium normal today. Given patient's poor tolerance [myalgias/fatigue over 1 to 2 days postinfusion]-I think is reasonable to switch to every 4 months. Low vit D- NOV 2023- start 50,K weekly.   # Fatigue: Likely secondary to whole brain radiation.  Okay to give pulmonary parking pass.  # Vaccinations: s/p flu shot- RSV- covid  [mon/-wed appt]   Zometa q 62m[nov23]  DISPOSITION:  # follow up 2  months MD; labs- cbc/cmp; CT chest prior-  Dr.B  # I reviewed the blood work- with the patient in detail; also reviewed the imaging  independently [as summarized above]; and with the patient in detail.

## 2022-03-03 ENCOUNTER — Other Ambulatory Visit: Payer: Self-pay | Admitting: Internal Medicine

## 2022-03-27 ENCOUNTER — Inpatient Hospital Stay: Payer: PPO | Admitting: Licensed Clinical Social Worker

## 2022-03-27 ENCOUNTER — Telehealth: Payer: Self-pay | Admitting: *Deleted

## 2022-03-27 DIAGNOSIS — C7951 Secondary malignant neoplasm of bone: Secondary | ICD-10-CM

## 2022-03-27 DIAGNOSIS — C7931 Secondary malignant neoplasm of brain: Secondary | ICD-10-CM

## 2022-03-27 DIAGNOSIS — C3411 Malignant neoplasm of upper lobe, right bronchus or lung: Secondary | ICD-10-CM

## 2022-03-27 NOTE — Telephone Encounter (Signed)
Nurse returned daughter's call wanting to speak with Dr Mickeal Skinner.  Nurse instructed daughter that MD was not in clinic today. Daughter has concerns that patient has had significant decline since last being seen by Dr Mickeal Skinner. Daughter is concerned that pt/her father maybe having seizures that are not witnessed.  Reports patient memory and gait have worsened significantly. Daughter would like to discuss concerns with provider. Nurse stated that she would send a message to Dr Mickeal Skinner and he would reach out once he is back in clinic next week.  Nurse also encouraged daughter to send mychart message and gave number to the clinic in [place].

## 2022-03-27 NOTE — Progress Notes (Signed)
[place] Work  Initial Assessment   Curtis Andrade. is a 76 y.o. year old male contacted caregiver by phone. Clinical Social Work was referred by medical provider for assessment of psychosocial needs.   SDOH (Social Determinants of Health) assessments performed: Yes SDOH Interventions    Flowsheet Row Clinical Support from 03/27/2022 in [place] at [place] Interventions   Food Insecurity Interventions Intervention Not Indicated  Housing Interventions Intervention Not Indicated  Transportation Interventions Intervention Not Indicated, Patient Resources (Friends/Family)  Utilities Interventions Intervention Not Indicated  Alcohol Usage Interventions Intervention Not Indicated (Score <7)  Financial Strain Interventions Intervention Not Indicated  Physical Activity Interventions Intervention Not Indicated  Stress Interventions Provide Counseling  [Caregiver requested counseling services]  Social Connections Interventions Intervention Not Indicated       SDOH Screenings   Food Insecurity: No Food Insecurity (03/27/2022)  Housing: Low Risk  (03/27/2022)  Transportation Needs: No Transportation Needs (03/27/2022)  Utilities: Not At Risk (03/27/2022)  Alcohol Screen: Low Risk  (03/27/2022)  Depression (PHQ2-9): Low Risk  (03/27/2022)  Financial Resource Strain: Low Risk  (03/27/2022)  Physical Activity: Insufficiently Active (03/27/2022)  Social Connections: Socially Integrated (03/27/2022)  Stress: Stress Concern Present (03/27/2022)  Tobacco Use: Medium Risk (02/27/2022)     Distress Screen completed: No     No data to display            Family/Social Information:  Housing Arrangement: patient lives with spouse,  Curtis Andrade, Curtis Andrade ([place]) [phone].  CSW spoke with patient's daughter and caregiver Curtis Andrade [phone] Family members/support persons in your life? Family, Friends, and Geophysical data processor  concerns: no  Employment: Retired  .  Income source: Paediatric nurse concerns: No Type of concern: None Food access concerns: no Religious or spiritual practice: Not known Services Currently in place:  [employer]  Coping/ Adjustment to diagnosis: Patient understands treatment plan and what happens next? Yes, patient has recently experienced capacity issues Concerns about diagnosis and/or treatment: How will I care for myself and Quality of life Patient reported stressors: Childcare/ elder care, Partner, Adjusting to my illness, and Physical issues Hopes and/or priorities: caregiver assistance Patient enjoys  N/A Current coping skills/ strengths: Average or above average intelligence , Financial means , Physical Health , and Supportive family/friends     SUMMARY: Current SDOH Barriers:  Level of care concerns, ADL IADL limitations, Cognitive Deficits, and Memory Deficits  Clinical Social Work Clinical Goal(s):  Patient will work with Curtis Andrade and Curtis Andrade to address needs related to care giving adjustment concerns.  Interventions: Discussed common feeling and emotions when being diagnosed with cancer, and the importance of support during treatment Informed patient of the support team roles and support services at [facility] Provided CSW contact information and encouraged patient to call with any questions or concerns Referred patient to [facility] RN CM and/or Social Worker and Provided patient with information about CSW role in patient care and information on available resources.  CSW will email information to Curtis Andrade, [email or web address]   Follow Up Plan: Patient will contact CSW with any support or resource needs and Caregiver will contact [employer] and contact CSW if Ms. Maqueda would like to meet with CSW for counseling. Patient verbalizes understanding of plan: Yes    [employer], LCSW

## 2022-03-28 ENCOUNTER — Ambulatory Visit (HOSPITAL_COMMUNITY)
Admission: RE | Admit: 2022-03-28 | Discharge: 2022-03-28 | Disposition: A | Discharge status: Home / Self Care | Payer: PPO | Source: Ambulatory Visit | Attending: Emergency Medicine | Admitting: Emergency Medicine

## 2022-03-28 ENCOUNTER — Encounter (HOSPITAL_COMMUNITY): Payer: Self-pay

## 2022-03-28 ENCOUNTER — Other Ambulatory Visit: Payer: Self-pay

## 2022-03-28 VITALS — BP 147/85 | HR 79 | Temp 97.7°F | Resp 20

## 2022-03-28 DIAGNOSIS — Z0189 Encounter for other specified special examinations: Secondary | ICD-10-CM | POA: Diagnosis not present

## 2022-03-28 LAB — POCT URINALYSIS DIPSTICK, ED / UC
Bilirubin Urine: NEGATIVE
Glucose, UA: NEGATIVE mg/dL
Hgb urine dipstick: NEGATIVE
Leukocytes,Ua: NEGATIVE
Nitrite: NEGATIVE
Protein, ur: 30 mg/dL — AB
Specific Gravity, Urine: >=1.03 (ref 1.005–1.030)
Urobilinogen, UA: 0.2 mg/dL (ref 0.0–1.0)
pH: 5.5 (ref 5.0–8.0)

## 2022-03-28 NOTE — ED Triage Notes (Signed)
Family reports there has been ashift in PT. The Cancer center suggested he come here for UA check.

## 2022-03-28 NOTE — ED Provider Notes (Signed)
[place]    CSN: [id number] Arrival date & time: 03/28/22  1359     History   Chief Complaint Urine test  HPI Curtis Andrade. is a 76 y.o. male.  Presents with daughter who was concerned about mental status change Patient has lung cancer with mets to brain, bone, liver Follows with onc. Cancer center suggested he come here for urine test to r/o acute cause.  Mental decline including memory and gait over the past several weeks. 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 Baruch Gouty and Dr. Nadeen Landau   Phlebitis 1998   Primary cancer of right upper lobe of lung ([place]) 01/14/2017   Dr. Genevive Bi performed a wedge resection of RUL.    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   Phlebitis 11/27/2016   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/27/2016   History of colonic polyps 11/26/2016    Past Surgical History:  Procedure Laterality Date   COLONOSCOPY  2013   Dr Vira Agar   COLONOSCOPY WITH PROPOFOL N/A 12/23/2016   Procedure: COLONOSCOPY WITH PROPOFOL;  Surgeon:  Robert Bellow, MD;  Location: [facility] ENDOSCOPY;  Service: Endoscopy;  Laterality: N/A;   EYE SURGERY     HERNIA REPAIR Right 1990's   TENDON REPAIR Left 03/14/2019   Procedure: TENDON REPAIR AND GRAFT  TRANSFER EXTENSOR INDICIS PROPRIUS LEFT THUMB;  Surgeon: [name], MD;  Location: [facility];  Service: Orthopedics;  Laterality: Left;  AXILLARY BLOCK   THORACOTOMY Right 01/14/2017   Procedure: RIGHT THORACOSCOPYWITH WIDE WEDGE RESECTION,  PREOP BROCHOSCOPY;  Surgeon: [name], MD;  Location: [facility] ORS;  Service: General;  Laterality: Right;   THROAT SURGERY  2010       Home Medications    Prior to Admission medications   Medication Sig Start Date End Date Taking? Authorizing Provider  Ascorbic Acid (VITAMIN C) 1000 MG tablet Take 1,000 mg by mouth daily.    [provider]  aspirin EC 81 MG tablet Take 81 mg by mouth daily.    [provider]  binimetinib (MEKTOVI) 15 MG tablet Take 3 tablets (45 mg total) by mouth 2 (two) times daily. 12/04/21   Darl Pikes, RPH-CPP  Calcium Carbonate-Vit D-Min (CALCIUM 1200 PO) Take 1 tablet by mouth daily.    [provider]  encorafenib (BRAFTOVI) 75 MG capsule Take 6 capsules (450 mg total) by mouth daily. 12/04/21   Darl Pikes, RPH-CPP  ergocalciferol (VITAMIN D2) 1.25 MG (50000 UT) capsule Take 1 capsule (50,000 Units total) by mouth  once a week. 02/27/22   Cammie Sickle, MD  folic acid (FOLVITE) 371 MCG tablet Take 800 mcg by mouth daily.    [provider]  lamoTRIgine (LAMICTAL) 25 MG tablet TAKE 2 TABLETS BY MOUTH DAILY 03/03/22   Ventura Sellers, MD  Multiple Vitamin (MULTIVITAMIN WITH MINERALS) TABS tablet Take 1 tablet by mouth daily. Senior Multivitamin.    [provider]  Multiple Vitamins-Minerals (EMERGEN-C IMMUNE PO) Take 1 tablet by mouth 2 (two) times daily.    [provider]  Omega-3 1000 MG CAPS Take 1 tablet by mouth 1 day or 1 dose.     [provider]  omeprazole (PRILOSEC) 40 MG capsule Take by mouth. 11/21/20   [provider]  saw palmetto 80 MG capsule Take 80 mg by mouth daily.    [provider]    Family History Family History  Problem Relation Age of Onset   Alzheimer's disease Mother    Alzheimer's disease Father    Stroke Father    Colon cancer Neg Hx     Social History Social History   Tobacco Use   Smoking status: Former    Years: 50.00    Types: Cigarettes    Quit date: 03/31/2007    Years since quitting: 15.0   Smokeless tobacco: Never  Vaping Use   Vaping Use: Never used  Substance Use Topics   Alcohol use: No   Drug use: No     Allergies   Patient has no known allergies.   Review of Systems Review of Systems As per HPI  Physical Exam Triage Vital Signs ED Triage Vitals  Enc Vitals Group     BP 03/28/22 1432 (!) 147/85     Pulse Rate 03/28/22 1432 79     Resp 03/28/22 1432 20     Temp 03/28/22 1432 97.7 F (36.5 C)     Temp src --      SpO2 03/28/22 1432 93 %     Weight --      Height --      Head Circumference --      Peak Flow --      Pain Score 03/28/22 1430 0     Pain Loc --      Pain Edu? --      Excl. in [place]? --    No data found.  Updated Vital Signs BP (!) 147/85   Pulse 79   Temp 97.7 F (36.5 C)   Resp 20   SpO2 93%    Physical Exam Vitals and nursing note reviewed.  Constitutional:      Appearance: Normal appearance.     Comments: Sitting in wheelchair. Answers questions appropriately   HENT:     Mouth/Throat:     Pharynx: Oropharynx is clear.  Cardiovascular:     Rate and Rhythm: Normal rate and regular rhythm.  Pulmonary:     Effort: Pulmonary effort is normal.  Skin:    General: Skin is warm and dry.  Neurological:     General: No focal deficit present.     Mental Status: He is alert and oriented to person, place, and time.     Cranial Nerves: No cranial nerve deficit.     Motor: No weakness, tremor or  seizure activity.     UC Treatments / Results  Labs (all labs ordered are listed, but only abnormal results are displayed) Labs Reviewed  POCT URINALYSIS DIPSTICK, ED / UC - Abnormal; Notable  for the following components:      Result Value   Ketones, ur TRACE (*)    Protein, ur 30 (*)    All other components within normal limits    EKG  Radiology No results found.  Procedures Procedures   Medications Ordered in UC Medications - No data to display  Initial Impression / Assessment and Plan / UC Course  I have reviewed the triage vital signs and the nursing notes.  Pertinent labs & imaging results that were available during my care of the patient were reviewed by me and considered in my medical decision making (see chart for details).  Stable vitals. Afebrile. UA in clinic trace ketones, elevated specific gravity. Recommend increase fluids. Not likely cause of the decline, but r/o acute cause. Although symptoms are likely related to brain metastasis.  Discussed close follow up with oncology. They have appointment for next week. Strict ED precautions in the meantime. Patient and daughter agree to plan  Final Clinical Impressions(s) / UC Diagnoses   Final diagnoses:  Encounter for urine test     Discharge Instructions      Increase fluids to at least 64 oz daily. Monitor for any worsening symptoms. With acute change including concerns for head injury, unresponsiveness, weakness of the extremities, please go to the emergency department.  Follow up with your oncologist at your visit next week.     ED Prescriptions   None    PDMP not reviewed this encounter.   [place], [place] 03/28/22 1542

## 2022-03-28 NOTE — Discharge Instructions (Signed)
Increase fluids to at least 64 oz daily. Monitor for any worsening symptoms. With acute change including concerns for head injury, unresponsiveness, weakness of the extremities, please go to the emergency department.  Follow up with your oncologist at your visit next week.

## 2022-03-30 ENCOUNTER — Encounter: Payer: Self-pay | Admitting: Internal Medicine

## 2022-04-02 ENCOUNTER — Telehealth: Payer: Self-pay | Admitting: *Deleted

## 2022-04-02 NOTE — Telephone Encounter (Signed)
Patient daughter Lenore Manner called asking to speak with Dr Mickeal Skinner before patient has his appointment tomorrow so that she can freely share with you what is going on with patient. Please return her call today if possible. [phone]

## 2022-04-03 ENCOUNTER — Inpatient Hospital Stay: Payer: HMO | Attending: Internal Medicine | Admitting: Internal Medicine

## 2022-04-03 ENCOUNTER — Encounter: Payer: Self-pay | Admitting: Internal Medicine

## 2022-04-03 VITALS — BP 113/70 | HR 97 | Temp 97.4°F | Resp 16 | Ht 71.0 in

## 2022-04-03 DIAGNOSIS — C7951 Secondary malignant neoplasm of bone: Secondary | ICD-10-CM | POA: Insufficient documentation

## 2022-04-03 DIAGNOSIS — C3411 Malignant neoplasm of upper lobe, right bronchus or lung: Secondary | ICD-10-CM | POA: Diagnosis not present

## 2022-04-03 DIAGNOSIS — Z79899 Other long term (current) drug therapy: Secondary | ICD-10-CM | POA: Diagnosis not present

## 2022-04-03 DIAGNOSIS — Z87891 Personal history of nicotine dependence: Secondary | ICD-10-CM | POA: Insufficient documentation

## 2022-04-03 DIAGNOSIS — R569 Unspecified convulsions: Secondary | ICD-10-CM | POA: Diagnosis not present

## 2022-04-03 DIAGNOSIS — C787 Secondary malignant neoplasm of liver and intrahepatic bile duct: Secondary | ICD-10-CM | POA: Diagnosis not present

## 2022-04-03 DIAGNOSIS — G3184 Mild cognitive impairment, so stated: Secondary | ICD-10-CM | POA: Diagnosis not present

## 2022-04-03 DIAGNOSIS — Z7982 Long term (current) use of aspirin: Secondary | ICD-10-CM | POA: Diagnosis not present

## 2022-04-03 DIAGNOSIS — C7931 Secondary malignant neoplasm of brain: Secondary | ICD-10-CM | POA: Diagnosis not present

## 2022-04-03 DIAGNOSIS — C7972 Secondary malignant neoplasm of left adrenal gland: Secondary | ICD-10-CM | POA: Insufficient documentation

## 2022-04-03 NOTE — Progress Notes (Signed)
[facility] at [place], [place] [address] [phone]   Interval Evaluation  Date of Service: 04/03/22 Patient Name: Curtis Andrade. Patient MRN: [id number] Patient DOB: 1945/06/19 Provider: Ventura Sellers, MD  Identifying Statement:  Curtis Andrade. is a 77 y.o. male with Malignant neoplasm metastatic to brain [facility])  Focal seizure ([place])   Primary Cancer:  Oncologic History: Oncology History Overview Note  # OCT 2018- STAGE I- A. 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  - PARENCHYMAL MARGIN APPEARS CLEAR; TUMOR WAS 1 CM FROM MARGIN BEFORE REMOVAL OF STAPLE LINE.; NO adjuvant therapy.   # DEC 2021-- METASTATIC CARCINOMA, COMPATIBLE WITH PULMONARY ADENOCARCINOMA [left shoulder Core Biopsy];  NOV 24th PET-right hilar and Hypermetabolic mediastinal lymphadenopathy; small pulmonary nodules; 2 metastatic lesions in the liver/  pancreatic head/left adrenal gland;  muscle and skeletal metastasis.  Subcutaneous nodule-positive for metastatic adeno lung.   # DEC 2021-Brain MRI multiple subcentimeter enhancing brain mets no significant edema - NO SYMPTOMS;s/p RT eval [Dr.Blackburn/[facility]-s/p WBRT- JAN 11th, 2022]  # JAN 14th, 2021-START DABRFENIB +MEKINIST [name] 2022- MUGA scan-EF-58%]; STOPPED MID OCT 2022- sec to acute pancreatitis  # NOV 7th, 2022- Enco+Bini.   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  Short course of Eliquis.smoking-quit 2008.  ---------------------------------------    DIAGNOSIS: LUNG CA  STAGE:  IV      ;GOALS:palliation     Primary cancer of right upper lobe of lung ([place])  04/09/2020 Cancer Staging   Staging form: Lung, AJCC 8th Edition - Clinical: Stage IVB (cN1, cM1c) - Signed by Cammie Sickle, MD on  04/09/2020    CNS Oncologic History 04/09/20: Completes WBRT for 11+ visible metastases ([place])  Interval History: Curtis Andrade. presents today for follow up given clinical concerns.  Family describes ongoing episodic decline in short term memory, overall cognition, balance.  He left stove running recently during a nap.  Today he is actually at or near his baseline; last week when things were declining he was acting as caretaker for his wife after her surgery.  Otherwise no further seizures, remains off Lamictal.  Continues to follow with Dr. B for oral TKI.  H+P (04/11/21) Presents today to review recent neurologic episode.  His wife describes episode of sudden confusion, shaking, disorientation, roughly one month ago.  His "whole body was shaking", which he remembers, but shortly after that became confused and non-sensical, which improved slowly over several days.  He describes a period of almost 24 hours with "total blackout, no memory".  Since then, he has been back at his normal baseline, with no recurrence.  He completed whole brain radiation ~1 year ago with Dr. Donella Stade for brain metastases.  Continues on oral TKIs for BRAF+ lung cancer with Dr. Rogue Bussing.  Medications: Current Outpatient Medications on File Prior to Visit  Medication Sig Dispense Refill   Ascorbic Acid (VITAMIN C) 1000 MG tablet Take 1,000 mg by mouth daily.     aspirin EC 81 MG tablet Take 81 mg by mouth daily.     binimetinib (MEKTOVI) 15 MG tablet Take 3 tablets (45 mg total) by mouth 2 (two) times daily. 180 tablet 3   Calcium Carbonate-Vit D-Min (CALCIUM 1200 PO) Take 1 tablet by mouth daily.     encorafenib (BRAFTOVI)  75 MG capsule Take 6 capsules (450 mg total) by mouth daily. 180 capsule 3   ergocalciferol (VITAMIN D2) 1.25 MG (50000 UT) capsule Take 1 capsule (50,000 Units total) by mouth once a week. 12 capsule 1   folic acid (FOLVITE) 540 MCG tablet Take 800 mcg by mouth daily.     lamoTRIgine (LAMICTAL)  25 MG tablet TAKE 2 TABLETS BY MOUTH DAILY 180 tablet 1   Multiple Vitamin (MULTIVITAMIN WITH MINERALS) TABS tablet Take 1 tablet by mouth daily. Senior Multivitamin.     Multiple Vitamins-Minerals (EMERGEN-C IMMUNE PO) Take 1 tablet by mouth 2 (two) times daily.     Omega-3 1000 MG CAPS Take 1 tablet by mouth 1 day or 1 dose.     omeprazole (PRILOSEC) 40 MG capsule Take by mouth.     saw palmetto 80 MG capsule Take 80 mg by mouth daily.     No current facility-administered medications on file prior to visit.    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 Baruch Gouty and Dr. Nadeen Landau   Phlebitis 1998   Primary cancer of right upper lobe of lung ([place]) 01/14/2017   Dr. Genevive Bi performed a wedge resection of RUL.    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/27/2016   Past Surgical History:  Past Surgical History:  Procedure Laterality Date   COLONOSCOPY  2013   Dr Vira Agar   COLONOSCOPY WITH PROPOFOL N/A 12/23/2016   Procedure: COLONOSCOPY WITH PROPOFOL;  Surgeon: Robert Bellow, MD;  Location: [facility] ENDOSCOPY;  Service: Endoscopy;  Laterality: N/A;   EYE SURGERY     HERNIA REPAIR Right 1990's   TENDON REPAIR Left 03/14/2019   Procedure: TENDON REPAIR AND GRAFT  TRANSFER EXTENSOR INDICIS PROPRIUS LEFT THUMB;  Surgeon: [name], MD;  Location: [facility];  Service: Orthopedics;  Laterality: Left;  AXILLARY BLOCK   THORACOTOMY Right 01/14/2017   Procedure: RIGHT THORACOSCOPYWITH WIDE WEDGE RESECTION,  PREOP BROCHOSCOPY;  Surgeon: [name], MD;  Location: [facility] ORS;  Service: General;  Laterality: Right;   THROAT SURGERY  2010   Social History:  Social History   Socioeconomic History    Marital status: Married    Spouse name: Not on file   Number of children: Not on file   Years of education: Not on file   Highest education level: Not on file  Occupational History   Not on file  Tobacco Use   Smoking status: Former    Years: 50.00    Types: Cigarettes    Quit date: 03/31/2007    Years since quitting: 15.0   Smokeless tobacco: Never  Vaping Use   Vaping Use: Never used  Substance and Sexual Activity   Alcohol use: No   Drug use: No   Sexual activity: Yes    Birth control/protection: None    Comment: Married  Other Topics Concern   Not on file  Social History Narrative   Not on file   Social Determinants of Health   Financial Resource Strain: Low Risk  (03/27/2022)   Overall Financial Resource Strain (CARDIA)    Difficulty of Paying Living Expenses: Not very hard  Food Insecurity: No Food Insecurity (03/27/2022)  Hunger Vital Sign    Worried About Running Out of Food in the Last Year: Never true    Ran Out of Food in the Last Year: Never true  Transportation Needs: No Transportation Needs (03/27/2022)   PRAPARE - Hydrologist (Medical): No    Lack of Transportation (Non-Medical): No  Physical Activity: Insufficiently Active (03/27/2022)   Exercise Vital Sign    Days of Exercise per Week: 2 days    Minutes of Exercise per Session: 30 min  Stress: Stress Concern Present (03/27/2022)   [place]    Feeling of Stress : Rather much  Social Connections: Socially Integrated (03/27/2022)   Social Connection and Isolation Panel [NHANES]    Frequency of Communication with Friends and Family: More than three times a week    Frequency of Social Gatherings with Friends and Family: More than three times a week    Attends Religious Services: 1 to 4 times per year    Active Member of [employer] or Organizations: Yes    Attends Archivist Meetings: Never    Marital  Status: Married  Human resources officer Violence: Not At Risk (03/27/2022)   Humiliation, Afraid, Rape, and Kick questionnaire    Fear of Current or Ex-Partner: No    Emotionally Abused: No    Physically Abused: No    Sexually Abused: No   Family History:  Family History  Problem Relation Age of Onset   Alzheimer's disease Mother    Alzheimer's disease Father    Stroke Father    Colon cancer Neg Hx     Review of Systems: Constitutional: Doesn't report fevers, chills or abnormal weight loss Eyes: Doesn't report blurriness of vision Ears, nose, mouth, throat, and face: Doesn't report sore throat Respiratory: Doesn't report cough, dyspnea or wheezes Cardiovascular: Doesn't report palpitation, chest discomfort  Gastrointestinal:  Doesn't report nausea, constipation, diarrhea GU: Doesn't report incontinence Skin: Doesn't report skin rashes Neurological: Per HPI Musculoskeletal: Doesn't report joint pain Behavioral/Psych: Doesn't report anxiety  Physical Exam: Vitals:   04/03/22 1008  BP: 113/70  Pulse: 97  Resp: 16  Temp: (!) 97.4 F (36.3 C)   KPS: 90. General: Alert, cooperative, pleasant, in no acute distress Head: Normal EENT: No conjunctival injection or scleral icterus.  Lungs: Resp effort normal Cardiac: Regular rate Abdomen: Non-distended abdomen Skin: No rashes cyanosis or petechiae. Extremities: No clubbing or edema  Neurologic Exam: Mental Status: Awake, alert, attentive to examiner. Oriented to self and environment. Language is fluent with intact comprehension.  Cranial Nerves: Visual acuity is grossly normal. Visual fields are full. Extra-ocular movements intact. No ptosis. Face is symmetric Motor: Tone and bulk are normal. Power is full in both arms and legs. Reflexes are symmetric, no pathologic reflexes present.  Sensory: Intact to light touch Gait: Normal.   Labs: I have reviewed the data as listed    Component Value Date/Time   NA 138 02/04/2022  1017   K 4.0 02/04/2022 1017   CL 104 02/04/2022 1017   CO2 26 02/04/2022 1017   GLUCOSE 107 (H) 02/04/2022 1017   BUN 16 02/04/2022 1017   CREATININE 1.09 02/04/2022 1017   CALCIUM 9.1 02/04/2022 1017   PROT 7.2 02/04/2022 1017   ALBUMIN 3.9 02/04/2022 1017   AST 27 02/04/2022 1017   ALT 17 02/04/2022 1017   ALKPHOS 39 02/04/2022 1017   BILITOT 0.6 02/04/2022 1017   GFRNONAA >60 02/04/2022 1017  GFRAA >60 02/10/2019 1003   Lab Results  Component Value Date   WBC 6.1 02/04/2022   NEUTROABS 3.8 02/04/2022   HGB 14.2 02/04/2022   HCT 43.6 02/04/2022   MCV 99.5 02/04/2022   PLT 161 02/04/2022     Assessment/Plan Malignant neoplasm metastatic to brain [facility])  Focal seizure ([place])  Curtis B Clementeen Graham. is clinically stable today, with episodes of acute on chronic decline in cognitive function.    MOCA exam was performed today, score was 21/30 with deficits in short term memory and visual/spatial processing.    We suspect etiology is encephalopathy as decompensation from baseline MCI, secondary to physical or environmental stressors.  This explains his return to prior baseline following recovery of his wife's injury.   Will check or re-check Vit D, B12, testosterone, cortisol in serum.  We again reviewed strategies for recovering cognitive function, including exercise, diet, memory exercises.   We appreciate the opportunity to participate in the care of [place]..   We ask that [place]. return to clinic in 3 months following next brain MRI, or sooner as needed.  All questions were answered. The patient knows to call the clinic with any problems, questions or concerns. No barriers to learning were detected.  The total time spent in the encounter was 40 minutes and more than 50% was on counseling and review of test results   Ventura Sellers, MD Medical Director of Neuro-Oncology [facility] at [place] 04/03/22 10:06 AM

## 2022-04-03 NOTE — Progress Notes (Signed)
Patient here for concern from family about cognitive and physical decline. Patient denies memory loss or physical difficulties.

## 2022-04-08 NOTE — Telephone Encounter (Signed)
[name] Encounter   Received notification re-enrollment for assistance for Braftovi and Mektovi through [employer] has been approved. Patient may continue to receive their medication at $0 from this program.    [employer]'s phone number [phone].   Effective dates: 03/30/22 through 03/30/23  I have spoken to the patient.  Berdine Addison, [facility] Patient [place]  [phone] (phone) [phone] (fax) 04/08/2022 3:53 PM

## 2022-04-13 ENCOUNTER — Inpatient Hospital Stay: Payer: HMO

## 2022-04-13 NOTE — Progress Notes (Signed)
[facility] CSW Progress Note  Clinical Child psychotherapist returned daughter's call.  CSW is helping cover for [employer], LCSW.  Deanne stated she would like counseling for her mother.  She would initially like her mom, herself and her sister to attend.  Deanne feels her mother would benefit from adjustment counseling.  She stated this is not immediate and could wait until [place] returns.    Dorothey Baseman, LCSW  Clinical Social Worker [facility]

## 2022-04-20 ENCOUNTER — Other Ambulatory Visit: Payer: Self-pay | Admitting: Radiation Therapy

## 2022-04-28 ENCOUNTER — Encounter: Payer: Self-pay | Admitting: Internal Medicine

## 2022-04-28 ENCOUNTER — Inpatient Hospital Stay: Payer: HMO | Admitting: Licensed Clinical Social Worker

## 2022-04-28 DIAGNOSIS — C7931 Secondary malignant neoplasm of brain: Secondary | ICD-10-CM

## 2022-04-28 DIAGNOSIS — C3411 Malignant neoplasm of upper lobe, right bronchus or lung: Secondary | ICD-10-CM

## 2022-04-28 NOTE — Progress Notes (Signed)
[facility] CSW Counseling Note  Patient was referred by medical provider. Treatment type: Family  Presenting Concerns: Patient and/or family reports the following symptoms/concerns: anxiety, stress, and adjustment to diagnosis, treatment and caregiving needs Duration of problem: 6 months; Severity of problem: moderate   Orientation:oriented to person, place, time/date, situation, day of week, month of year, and year.   Affect: Depressed and Tearful Risk of harm to self or others: No plan to harm self or others  Patient and/or Family's Strengths/Protective Factors: Social connections, Social and Emotional competence, Concrete supports in place (healthy food, safe environments, etc.), Sense of purpose, Physical Health (exercise, healthy diet, medication compliance, etc.), and Caregiver has knowledge of parenting & child developmentActive sense of humor  Average or above average intelligence  Financial means  General fund of knowledge  Motivation for treatment/growth  Religious Affiliation  Supportive family/friends  Work skills      Goals Addressed: Patient will:  Reduce symptoms of: anxiety, depression, stress, and adjustment concerns Increase knowledge and/or ability of: coping skills, healthy habits, self-management skills, stress reduction, and acceptance of diagnosis and adjustments in care giving   Increase healthy adjustment to current life circumstances, Increase adequate support systems for patient/family, Begin healthy grieving over loss, and improve communication   Progress towards Goals: Initial   Interventions: Interventions utilized:  CBT, Motivational Interviewing, Solution Focused, Strength-based, Supportive, Family Systems, and Reframing      Assessment: Patient currently experiencing cognitive decline since metastasis diagnosis.  CSW met with patient's spouse and adult daughters to discuss concerns of care giving and adjustment to patient's physical and cognitive  decline. Discussed with family sharing care giving needs and communication skills to assist them with coordinating caregiver duties. Discussed ways to manage self-care for Ms. Ruacho and both daughters.  All three agree to continued counseling support.        Plan: Follow up with CSW: 05/12/2022 @ 10:30 Behavioral recommendations: increase communication and express need for assistance, coordinate scheduled and care giving duties. Referral(s): Support group(s):  N/A         Adelene Amas, LCSW

## 2022-05-01 ENCOUNTER — Encounter: Payer: Self-pay | Admitting: Internal Medicine

## 2022-05-04 ENCOUNTER — Inpatient Hospital Stay: Payer: HMO

## 2022-05-04 ENCOUNTER — Ambulatory Visit: Payer: PPO | Admitting: Internal Medicine

## 2022-05-04 ENCOUNTER — Encounter: Payer: Self-pay | Admitting: Internal Medicine

## 2022-05-04 ENCOUNTER — Inpatient Hospital Stay: Payer: HMO | Attending: Internal Medicine | Admitting: Internal Medicine

## 2022-05-04 ENCOUNTER — Other Ambulatory Visit: Payer: PPO

## 2022-05-04 ENCOUNTER — Telehealth: Payer: Self-pay

## 2022-05-04 DIAGNOSIS — C3411 Malignant neoplasm of upper lobe, right bronchus or lung: Secondary | ICD-10-CM | POA: Insufficient documentation

## 2022-05-04 DIAGNOSIS — Z79899 Other long term (current) drug therapy: Secondary | ICD-10-CM | POA: Diagnosis not present

## 2022-05-04 DIAGNOSIS — I951 Orthostatic hypotension: Secondary | ICD-10-CM | POA: Insufficient documentation

## 2022-05-04 DIAGNOSIS — Z923 Personal history of irradiation: Secondary | ICD-10-CM | POA: Diagnosis not present

## 2022-05-04 DIAGNOSIS — C7931 Secondary malignant neoplasm of brain: Secondary | ICD-10-CM | POA: Diagnosis not present

## 2022-05-04 DIAGNOSIS — C787 Secondary malignant neoplasm of liver and intrahepatic bile duct: Secondary | ICD-10-CM | POA: Diagnosis not present

## 2022-05-04 DIAGNOSIS — C7951 Secondary malignant neoplasm of bone: Secondary | ICD-10-CM | POA: Diagnosis not present

## 2022-05-04 DIAGNOSIS — C342 Malignant neoplasm of middle lobe, bronchus or lung: Secondary | ICD-10-CM | POA: Diagnosis not present

## 2022-05-04 LAB — COMPREHENSIVE METABOLIC PANEL
ALT: 18 U/L (ref 0–44)
AST: 26 U/L (ref 15–41)
Albumin: 4.2 g/dL (ref 3.5–5.0)
Alkaline Phosphatase: 36 U/L — ABNORMAL LOW (ref 38–126)
Anion gap: 11 (ref 5–15)
BUN: 15 mg/dL (ref 8–23)
CO2: 27 mmol/L (ref 22–32)
Calcium: 9.2 mg/dL (ref 8.9–10.3)
Chloride: 97 mmol/L — ABNORMAL LOW (ref 98–111)
Creatinine, Ser: 1.07 mg/dL (ref 0.61–1.24)
GFR, Estimated: >60 mL/min (ref >60)
Glucose, Bld: 131 mg/dL — ABNORMAL HIGH (ref 70–99)
Potassium: 4 mmol/L (ref 3.5–5.1)
Sodium: 135 mmol/L (ref 135–145)
Total Bilirubin: 0.4 mg/dL (ref 0.3–1.2)
Total Protein: 8 g/dL (ref 6.5–8.1)

## 2022-05-04 LAB — CBC WITH DIFFERENTIAL/PLATELET
Abs Immature Granulocytes: 0.03 10*3/uL (ref 0.00–0.07)
Basophils Absolute: 0 10*3/uL (ref 0.0–0.1)
Basophils Relative: 1 %
Eosinophils Absolute: 0.2 10*3/uL (ref 0.0–0.5)
Eosinophils Relative: 3 %
HCT: 47 % (ref 39.0–52.0)
Hemoglobin: 15.5 g/dL (ref 13.0–17.0)
Immature Granulocytes: 0 %
Lymphocytes Relative: 21 %
Lymphs Abs: 1.5 10*3/uL (ref 0.7–4.0)
MCH: 31.4 pg (ref 26.0–34.0)
MCHC: 33 g/dL (ref 30.0–36.0)
MCV: 95.3 fL (ref 80.0–100.0)
Monocytes Absolute: 0.4 10*3/uL (ref 0.1–1.0)
Monocytes Relative: 5 %
Neutro Abs: 4.8 10*3/uL (ref 1.7–7.7)
Neutrophils Relative %: 70 %
Platelets: 185 10*3/uL (ref 150–400)
RBC: 4.93 MIL/uL (ref 4.22–5.81)
RDW: 14.2 % (ref 11.5–15.5)
WBC: 6.8 10*3/uL (ref 4.0–10.5)
nRBC: 0 % (ref 0.0–0.2)

## 2022-05-04 NOTE — Progress Notes (Signed)
Patient's family has noticed a decline in cognitive behavior.  Worsening vision and unable to drive safely so family not allowing him to drive.  Also new difficulty walking that he is now using a Rolator walker for assistance.  Sleeping more than is normal with 12 hr sleep at night and napping during the day.    Wondering if the vitamins are necessary now with difficulty taking many meds.  Bowel movements not on regular basis and sometimes needing Miralax but then will have diarrhea.    Reporting good appetite with 8 lb wt loss in 2 months.

## 2022-05-04 NOTE — Patient Instructions (Signed)
#   STOP chemo pills- b-raftovi+ Mektovi

## 2022-05-04 NOTE — Telephone Encounter (Signed)
Referral faxed to [facility] (P: [phone] F: [phone])

## 2022-05-04 NOTE — Progress Notes (Signed)
[facility] NOTE  Patient Care Team: [name], MD as PCP - General (Family Medicine) [name], MD as Consulting Physician (Family Medicine) [name], [name], MD (General Surgery) [place] Park, DO as Consulting Physician (Radiation Oncology) Cammie Sickle, MD as Consulting Physician (Oncology)  CHIEF COMPLAINTS/PURPOSE OF CONSULTATION: Lung cancer  #  Oncology History Overview Note  # OCT 2018- STAGE I- A. 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  - PARENCHYMAL MARGIN APPEARS CLEAR; TUMOR WAS 1 CM FROM MARGIN BEFORE REMOVAL OF STAPLE LINE.; NO adjuvant therapy.   # DEC 2021-- METASTATIC CARCINOMA, COMPATIBLE WITH PULMONARY ADENOCARCINOMA [left shoulder Core Biopsy];  NOV 24th PET-right hilar and Hypermetabolic mediastinal lymphadenopathy; small pulmonary nodules; 2 metastatic lesions in the liver/  pancreatic head/left adrenal gland;  muscle and skeletal metastasis.  Subcutaneous nodule-positive for metastatic adeno lung.   # DEC 2021-Brain MRI multiple subcentimeter enhancing brain mets no significant edema - NO SYMPTOMS;s/p RT eval [Dr.Blackburn/Crystal-s/p WBRT- JAN 11th, 2022]  # JAN 14th, 2021-START DABRFENIB +MEKINIST [name] 2022- MUGA scan-EF-58%]; STOPPED MID OCT 2022- sec to acute pancreatitis  # NOV 7th, 2022- Enco+Bini.   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  Short course of Eliquis.smoking-quit 2008.  ---------------------------------------    DIAGNOSIS: LUNG CA  STAGE:  IV      ;GOALS:palliation     Primary cancer of right upper lobe of lung ([place])  04/09/2020 Cancer Staging   Staging form: Lung, AJCC 8th Edition - Clinical: Stage IVB (cN1, cM1c) - Signed by Cammie Sickle, MD on 04/09/2020     HISTORY OF PRESENTING  ILLNESS: Ambulating by independently.  Accompanied by his wife.  Daughter over the phone.  Curtis Andrade 77 y.o.  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  Patient has been evaluated by neuro-oncology in the interim.  Patient's family has noticed a decline in cognitive behavior.  Worsening vision and unable to drive safely so family not allowing him to drive.  Also new difficulty walking that he is now using a Rolator walker for assistance.  Sleeping more than is normal with 12 hr sleep at night and napping during the day.    Patient also noted to have worsening vision changes.  Blurry vision and also limited focus.  Patient also complains of multiple trips to the bathroom to urinate overnight.  This has been disrupting his sleep.  Wondering if the vitamins are necessary now with difficulty taking many meds.  Reporting good appetite with 8 lb wt loss in 2 months. Continues to complain of fatigue.  No further seizures.  No headaches. No nausea vomiting.  Denies any abdominal pain.   Review of Systems  Constitutional:  Positive for malaise/fatigue. Negative for chills, diaphoresis, fever and weight loss.  HENT:  Negative for nosebleeds and sore throat.   Eyes:  Negative for double vision.  Respiratory:  Negative for cough, hemoptysis, sputum production, shortness of breath and wheezing.   Cardiovascular:  Negative for chest pain, palpitations, orthopnea and leg swelling.  Gastrointestinal:  Negative for abdominal pain, blood in stool, constipation, diarrhea, heartburn, melena, nausea and vomiting.  Genitourinary:  Negative for dysuria, frequency and urgency.  Musculoskeletal:  Positive for back pain and joint pain.  Skin: Negative.  Negative for itching and rash.  Neurological:  Positive for weakness. Negative for dizziness, tingling, focal weakness and headaches.   Endo/Heme/Allergies:  Does not bruise/bleed easily.  Psychiatric/Behavioral:  Positive for memory loss. Negative for depression. The patient is not nervous/anxious and does not have insomnia.      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 Baruch Gouty and Dr. Nadeen Landau   Phlebitis 1998   Primary cancer of right upper lobe of lung ([place]) 01/14/2017   Dr. Genevive Bi performed a wedge resection of RUL.    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/27/2016    SURGICAL HISTORY: Past Surgical History:  Procedure Laterality Date   COLONOSCOPY  2013   Dr Vira Agar   COLONOSCOPY WITH PROPOFOL N/A 12/23/2016   Procedure: COLONOSCOPY WITH PROPOFOL;  Surgeon: Robert Bellow, MD;  Location: [facility] ENDOSCOPY;  Service: Endoscopy;  Laterality: N/A;   EYE SURGERY     HERNIA REPAIR Right 1990's   TENDON REPAIR Left 03/14/2019   Procedure: TENDON REPAIR AND GRAFT  TRANSFER EXTENSOR INDICIS PROPRIUS LEFT THUMB;  Surgeon: [name], MD;  Location: [facility];  Service: Orthopedics;  Laterality: Left;  AXILLARY BLOCK   THORACOTOMY Right 01/14/2017   Procedure: RIGHT THORACOSCOPYWITH WIDE WEDGE RESECTION,  PREOP BROCHOSCOPY;  Surgeon: [name], MD;  Location: [facility] ORS;  Service: General;  Laterality: Right;   THROAT SURGERY  2010    SOCIAL HISTORY: Social History   Socioeconomic History   Marital status: Married    Spouse name: Not on file   Number of children: Not on file   Years of education: Not on file   Highest education level: Not on file  Occupational History   Not on file  Tobacco Use   Smoking status: Former    Years: 50.00    Types: Cigarettes    Quit date: 03/31/2007    Years since quitting: 15.1   Smokeless tobacco: Never   Vaping Use   Vaping Use: Never used  Substance and Sexual Activity   Alcohol use: No   Drug use: No   Sexual activity: Yes    Birth control/protection: None    Comment: Married  Other Topics Concern   Not on file  Social History Narrative   Not on file   Social Determinants of Health   Financial Resource Strain: Low Risk  (03/27/2022)   Overall Financial Resource Strain (CARDIA)    Difficulty of Paying Living Expenses: Not very hard  Food Insecurity: No Food Insecurity (03/27/2022)   Hunger Vital Sign    Worried About Running Out of Food in the Last Year: Never true    Ran Out of Food in the Last Year: Never true  Transportation Needs: No Transportation Needs (03/27/2022)   PRAPARE - Hydrologist (Medical): No    Lack of Transportation (Non-Medical): No  Physical Activity: Insufficiently Active (03/27/2022)   Exercise Vital Sign    Days of Exercise per Week: 2 days    Minutes of Exercise per Session: 30 min  Stress: Stress Concern Present (03/27/2022)   [place]    Feeling of Stress : Rather much  Social  Connections: Socially Integrated (03/27/2022)   Social Connection and Isolation Panel [NHANES]    Frequency of Communication with Friends and Family: More than three times a week    Frequency of Social Gatherings with Friends and Family: More than three times a week    Attends Religious Services: 1 to 4 times per year    Active Member of [employer] or Organizations: Yes    Attends Archivist Meetings: Never    Marital Status: Married  Human resources officer Violence: Not At Risk (03/27/2022)   Humiliation, Afraid, Rape, and Kick questionnaire    Fear of Current or Ex-Partner: No    Emotionally Abused: No    Physically Abused: No    Sexually Abused: No    FAMILY HISTORY: Family History  Problem Relation Age of Onset   Alzheimer's disease Mother    Alzheimer's disease  Father    Stroke Father    Colon cancer Neg Hx     ALLERGIES:  has No Known Allergies.  MEDICATIONS:  Current Outpatient Medications  Medication Sig Dispense Refill   Ascorbic Acid (VITAMIN C) 1000 MG tablet Take 1,000 mg by mouth daily.     aspirin EC 81 MG tablet Take 81 mg by mouth daily.     binimetinib (MEKTOVI) 15 MG tablet Take 3 tablets (45 mg total) by mouth 2 (two) times daily. 180 tablet 3   Calcium Carbonate-Vit D-Min (CALCIUM 1200 PO) Take 1 tablet by mouth daily.     encorafenib (BRAFTOVI) 75 MG capsule Take 6 capsules (450 mg total) by mouth daily. 098 capsule 3   folic acid (FOLVITE) 119 MCG tablet Take 800 mcg by mouth daily.     Multiple Vitamin (MULTIVITAMIN WITH MINERALS) TABS tablet Take 1 tablet by mouth daily. Senior Multivitamin.     Multiple Vitamins-Minerals (EMERGEN-C IMMUNE PO) Take 1 tablet by mouth 2 (two) times daily.     Omega-3 1000 MG CAPS Take 1 tablet by mouth 1 day or 1 dose.     omeprazole (PRILOSEC) 40 MG capsule Take by mouth.     saw palmetto 80 MG capsule Take 80 mg by mouth daily.     ergocalciferol (VITAMIN D2) 1.25 MG (50000 UT) capsule Take 1 capsule (50,000 Units total) by mouth once a week. (Patient not taking: Reported on 05/04/2022) 12 capsule 1   lamoTRIgine (LAMICTAL) 25 MG tablet TAKE 2 TABLETS BY MOUTH DAILY (Patient not taking: Reported on 05/04/2022) 180 tablet 1   No current facility-administered medications for this visit.      Marland Kitchen  PHYSICAL EXAMINATION: ECOG PERFORMANCE STATUS: 0 - Asymptomatic  Vitals:   05/04/22 1000  BP: 135/82  Pulse: 81  Resp: 16  Temp: (!) 96.5 F (35.8 C)     Filed Weights   05/04/22 1000  Weight: 200 lb 9.6 oz (91 kg)      Physical Exam HENT:     Head: Normocephalic and atraumatic.     Mouth/Throat:     Pharynx: No oropharyngeal exudate.  Eyes:     Pupils: Pupils are equal, round, and reactive to light.  Cardiovascular:     Rate and Rhythm: Normal rate and regular rhythm.   Pulmonary:     Effort: Pulmonary effort is normal. No respiratory distress.     Breath sounds: Normal breath sounds. No wheezing.  Abdominal:     General: Bowel sounds are normal. There is no distension.     Palpations: Abdomen is soft. There is no mass.  Tenderness: There is no abdominal tenderness. There is no guarding or rebound.  Musculoskeletal:        General: No tenderness. Normal range of motion.     Cervical back: Normal range of motion and neck supple.  Skin:    General: Skin is warm.  Neurological:     Mental Status: He is alert and oriented to person, place, and time.  Psychiatric:        Mood and Affect: Affect normal.   ;  LABORATORY DATA:  I have reviewed the data as listed Lab Results  Component Value Date   WBC 6.8 05/04/2022   HGB 15.5 05/04/2022   HCT 47.0 05/04/2022   MCV 95.3 05/04/2022   PLT 185 05/04/2022   Recent Labs    12/05/21 1429 02/04/22 1017 05/04/22 1002  NA 139 138 135  K 4.6 4.0 4.0  CL 105 104 97*  CO2 28 26 27   GLUCOSE 113* 107* 131*  BUN 19 16 15   CREATININE 1.10 1.09 1.07  CALCIUM 8.7* 9.1 9.2  GFRNONAA >60 >60 >60  PROT 6.9 7.2 8.0  ALBUMIN 3.9 3.9 4.2  AST 26 27 26   ALT 19 17 18   ALKPHOS 43 39 36*  BILITOT 0.4 0.6 0.4    RADIOGRAPHIC STUDIES: I have personally reviewed the radiological images as listed and agreed with the findings in the report. No results found.  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 Other scattered areas of low-density in the liver are similarly unchanged.  Await  repeat CT scan in a month from now.  See below  # Currently on B-RAF V600E inhibitor [discon-dabrafenib plus Mekinist;Oct, 2022- sec to pancreatitis] currently on started on 7th NOV, 2022- Enco+Bini.  Recommend HOLD Enco+Bini- given vision changes. see below regarding right upper lobe nodule/visoin changes.   # Right upper lobe lung nodule-subcentimeter in size.  Unclear infectious versus malignancy. Awaiting repeat a CAT scan in end of FEB 2024.   # Vision/focussin difficulty- ? MEK inhibitor-recommend holding MEKinhibitor/B-raf inhibitor.  MEK inhibitors can cause serous retinitis. recommend evaluation with [facility].  Referral sent.  #? Seizures-DEC 2022 [Dr.vaslow]--currently OFF Vimpat.  Tolerating well.  No side effects noted.   MRI NOV 2023- stable  # right temple [place] s/p excision [Dr.Dasher q6M]-monitor closely on B-raf inhibitors.stable.   # Brain metastases: s/p WBRT [dec 2021]; DEC 29th, 2022-interval improvement in numerous hemorrhagic metastases in the brain, which have decreased in conspicuity. No new lesions identified. MRI NOV 2023- stable /Dr.Vaslow.  Awaiting MRI in April 2024.  # Bone lesions-mildly symptomatic-[30 mins; tylenol]. premedicated with Tylenol at home.;  Calcium normal today. Given patient's poor tolerance [myalgias/fatigue over 1 to 2 days postinfusion]-I think is reasonable to switch to every 4 months. Low vit D- NOV 2023- on OTC vit D.   # Fatigue/cognitive decline-had a long discussion with the patient and his family regarding the natural history of cognitive decline status post whole brain radiation/also the potential side effects from underlying oral therapy.  No clinical concern for any progressive disease in the brain at this time.  Await MRI brain as  ordered in April 2024.  ?  Interrupted sleep- see below  # increased frequency of urination: Question BPH.  Since this is interrupting patient's sleep, refer to urology for further evaluation  recommendations.  # Vaccinations: s/p flu shot- RSV- covid  [mon/-wed appt]   Zometa q 23m [nov23]  DISPOSITION:  # refer to urology re: increased frequency of urination/?prostate enlargement # [facility]- re: vision changes on MEK inhibitor/B-raf inhibitor # follow up 1 month MD; labs- cbc/cmp; Zometa- as ordered/scheduled CT chest prior-  Dr.B       All questions were answered. The patient knows to call the clinic with any problems, questions or concerns.    Cammie Sickle, MD 05/04/2022 12:57 PM

## 2022-05-04 NOTE — Assessment & Plan Note (Addendum)
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 Other scattered areas of low-density in the liver are similarly unchanged.  Await repeat CT scan in a month from now.  See below  # Currently on B-RAF V600E inhibitor [discon-dabrafenib plus Mekinist;Oct, 2022- sec to pancreatitis] currently on started on 7th NOV, 2022- Enco+Bini.  Recommend HOLD Enco+Bini- given vision changes. see below regarding right upper lobe nodule/visoin changes.   # Right upper lobe lung nodule-subcentimeter in size.  Unclear infectious versus malignancy. Awaiting repeat a CAT scan in end of FEB 2024.   # Vision/focussin difficulty- ? MEK inhibitor-recommend holding MEKinhibitor/B-raf inhibitor.  MEK inhibitors can cause serous retinitis. recommend evaluation with [facility].  Referral sent.  #? Seizures-DEC 2022 [Dr.vaslow]--currently OFF Vimpat.  Tolerating well.  No side effects noted.   MRI NOV 2023- stable  # right temple [place] s/p excision [Dr.Dasher q6M]-monitor closely on B-raf inhibitors.stable.   # Brain metastases: s/p WBRT [dec 2021]; DEC 29th, 2022-interval improvement in numerous hemorrhagic metastases in the brain, which have decreased in conspicuity. No new lesions identified. MRI NOV 2023- stable /Dr.Vaslow.  Awaiting MRI in April 2024.  # Bone lesions-mildly symptomatic-[30 mins; tylenol]. premedicated with Tylenol at home.;  Calcium normal today. Given patient's poor tolerance [myalgias/fatigue over 1 to 2 days postinfusion]-I think is reasonable to switch to every 4 months. Low vit D- NOV 2023- on OTC vit D.   # Fatigue/cognitive decline-had a long  discussion with the patient and his family regarding the natural history of cognitive decline status post whole brain radiation/also the potential side effects from underlying oral therapy.  No clinical concern for any progressive disease in the brain at this time.  Await MRI brain as ordered in April 2024.  ?  Interrupted sleep- see below  # increased frequency of urination: Question BPH.  Since this is interrupting patient's sleep, refer to urology for further evaluation recommendations.  # Vaccinations: s/p flu shot- RSV- covid  [mon/-wed appt]   Zometa q 48m [nov23]  DISPOSITION:  # refer to urology re: increased frequency of urination/?prostate enlargement # [facility]- re: vision changes on MEK inhibitor/B-raf inhibitor # follow up 1 month MD; labs- cbc/cmp; Zometa- as ordered/scheduled CT chest prior-  Dr.B

## 2022-05-12 ENCOUNTER — Inpatient Hospital Stay: Payer: HMO | Admitting: Licensed Clinical Social Worker

## 2022-05-12 DIAGNOSIS — C7951 Secondary malignant neoplasm of bone: Secondary | ICD-10-CM

## 2022-05-12 DIAGNOSIS — C329 Malignant neoplasm of larynx, unspecified: Secondary | ICD-10-CM

## 2022-05-12 DIAGNOSIS — C3411 Malignant neoplasm of upper lobe, right bronchus or lung: Secondary | ICD-10-CM

## 2022-05-12 NOTE — Progress Notes (Signed)
[facility] CSW Counseling Note  Patient was referred by medical provider. Treatment type: Family  Presenting Concerns: Patient and/or family reports the following symptoms/concerns: anxiety, stress, and adjustment concerns to treatment and diagnosis Duration of problem: 6 months; Severity of problem: moderate   Orientation:oriented to person, place, time/date, situation, day of week, month of year, year, and stated date of N/A .   Affect: Appropriate and Tearful Risk of harm to self or others: No plan to harm self or others  Patient and/or Family's Strengths/Protective Factors: Social connections, Social and Emotional competence, Concrete supports in place (healthy food, safe environments, etc.), Sense of purpose, Physical Health (exercise, healthy diet, medication compliance, etc.), Caregiver has knowledge of parenting & child development, and Parental ResilienceAverage or above average intelligence  Engineer, drilling fund of knowledge  Motivation for treatment/growth  Physical Health  Religious Affiliation  Supportive family/friends  Work skills      Goals Addressed: Patient will:  Reduce symptoms of: anxiety, stress, and adjustment concerns Increase knowledge and/or ability of: coping skills, healthy habits, and stress reduction , familial communication Increase healthy adjustment to current life circumstances, Increase adequate support systems for patient/family, and Begin healthy grieving over loss   Progress towards Goals: Progressing   Interventions: Interventions utilized:  CBT, Solution Focused, Strength-based, Supportive, Family Systems, and Reframing      Assessment: Patient currently experiencing cognitive decline since metastasis diagnosis.  CSW met with patient's adult daughters to discuss concerns of care giving and adjustment to patient's physical and cognitive decline. Discussed adjustments made to communicating needs since last session.  Discussed ways to encourage Ms. Dyal to discussed future planning for care giving and adjustment to patient's physical needs. Discussed different ways to address current issues and possible future situation concerning patient's outcomes, with grandchildren.  Mr. Longley daughter's scheduled appointment for patient and Ms. Castrillon, possible family session      Plan: Follow up with CSW: 05/26/2022 @ 10:30 Behavioral recommendations: communication, asking questions about future planning Referral(s): Other medical:  conversation with Palliative Care NP  to discuss hospice support        [place], LCSW

## 2022-05-13 ENCOUNTER — Ambulatory Visit: Admission: RE | Admit: 2022-05-13 | Payer: HMO | Source: Ambulatory Visit

## 2022-05-14 DIAGNOSIS — H4912 Fourth [trochlear] nerve palsy, left eye: Secondary | ICD-10-CM | POA: Diagnosis not present

## 2022-05-14 DIAGNOSIS — Z961 Presence of intraocular lens: Secondary | ICD-10-CM | POA: Diagnosis not present

## 2022-05-14 DIAGNOSIS — H43813 Vitreous degeneration, bilateral: Secondary | ICD-10-CM | POA: Diagnosis not present

## 2022-05-14 NOTE — Telephone Encounter (Signed)
Message left for [facility] to return my call for an update on referral.

## 2022-05-18 ENCOUNTER — Ambulatory Visit
Admission: RE | Admit: 2022-05-18 | Discharge: 2022-05-18 | Disposition: A | Discharge status: Home / Self Care | Payer: HMO | Source: Ambulatory Visit | Attending: Internal Medicine | Admitting: Internal Medicine

## 2022-05-18 ENCOUNTER — Encounter: Payer: Self-pay | Admitting: Internal Medicine

## 2022-05-18 DIAGNOSIS — C3411 Malignant neoplasm of upper lobe, right bronchus or lung: Secondary | ICD-10-CM | POA: Diagnosis not present

## 2022-05-18 DIAGNOSIS — C801 Malignant (primary) neoplasm, unspecified: Secondary | ICD-10-CM | POA: Diagnosis not present

## 2022-05-18 DIAGNOSIS — J439 Emphysema, unspecified: Secondary | ICD-10-CM | POA: Diagnosis not present

## 2022-05-18 DIAGNOSIS — C7800 Secondary malignant neoplasm of unspecified lung: Secondary | ICD-10-CM | POA: Diagnosis not present

## 2022-05-18 DIAGNOSIS — J479 Bronchiectasis, uncomplicated: Secondary | ICD-10-CM | POA: Diagnosis not present

## 2022-05-18 MED ORDER — IOHEXOL 300 MG/ML  SOLN
75.0000 mL | Freq: Once | INTRAMUSCULAR | Status: AC | PRN
  Administered 2022-05-18: 75 mL via INTRAVENOUS

## 2022-05-18 NOTE — Telephone Encounter (Signed)
Patient was seen on 05/15/22

## 2022-05-19 ENCOUNTER — Inpatient Hospital Stay: Payer: HMO

## 2022-05-19 ENCOUNTER — Other Ambulatory Visit: Payer: Self-pay

## 2022-05-19 ENCOUNTER — Other Ambulatory Visit: Payer: Self-pay | Admitting: *Deleted

## 2022-05-19 ENCOUNTER — Encounter: Payer: Self-pay | Admitting: Hospice and Palliative Medicine

## 2022-05-19 ENCOUNTER — Inpatient Hospital Stay (HOSPITAL_BASED_OUTPATIENT_CLINIC_OR_DEPARTMENT_OTHER): Payer: HMO | Admitting: Hospice and Palliative Medicine

## 2022-05-19 ENCOUNTER — Telehealth: Payer: Self-pay | Admitting: *Deleted

## 2022-05-19 VITALS — BP 145/80 | HR 68 | Temp 97.6°F | Resp 18 | Ht 71.0 in | Wt 199.0 lb

## 2022-05-19 DIAGNOSIS — C3411 Malignant neoplasm of upper lobe, right bronchus or lung: Secondary | ICD-10-CM

## 2022-05-19 DIAGNOSIS — R531 Weakness: Secondary | ICD-10-CM

## 2022-05-19 DIAGNOSIS — R34 Anuria and oliguria: Secondary | ICD-10-CM

## 2022-05-19 LAB — CMP (CANCER CENTER ONLY)
ALT: 18 U/L (ref 0–44)
AST: 23 U/L (ref 15–41)
Albumin: 4.2 g/dL (ref 3.5–5.0)
Alkaline Phosphatase: 41 U/L (ref 38–126)
Anion gap: 11 (ref 5–15)
BUN: 16 mg/dL (ref 8–23)
CO2: 26 mmol/L (ref 22–32)
Calcium: 9.1 mg/dL (ref 8.9–10.3)
Chloride: 100 mmol/L (ref 98–111)
Creatinine: 0.84 mg/dL (ref 0.61–1.24)
GFR, Estimated: >60 mL/min (ref >60)
Glucose, Bld: 112 mg/dL — ABNORMAL HIGH (ref 70–99)
Potassium: 3.9 mmol/L (ref 3.5–5.1)
Sodium: 137 mmol/L (ref 135–145)
Total Bilirubin: 0.7 mg/dL (ref 0.3–1.2)
Total Protein: 7.9 g/dL (ref 6.5–8.1)

## 2022-05-19 LAB — CBC WITH DIFFERENTIAL (CANCER CENTER ONLY)
Abs Immature Granulocytes: 0.02 10*3/uL (ref 0.00–0.07)
Basophils Absolute: 0 10*3/uL (ref 0.0–0.1)
Basophils Relative: 0 %
Eosinophils Absolute: 0.1 10*3/uL (ref 0.0–0.5)
Eosinophils Relative: 2 %
HCT: 46.5 % (ref 39.0–52.0)
Hemoglobin: 15.3 g/dL (ref 13.0–17.0)
Immature Granulocytes: 0 %
Lymphocytes Relative: 13 %
Lymphs Abs: 0.7 10*3/uL (ref 0.7–4.0)
MCH: 31.7 pg (ref 26.0–34.0)
MCHC: 32.9 g/dL (ref 30.0–36.0)
MCV: 96.5 fL (ref 80.0–100.0)
Monocytes Absolute: 0.4 10*3/uL (ref 0.1–1.0)
Monocytes Relative: 7 %
Neutro Abs: 4.6 10*3/uL (ref 1.7–7.7)
Neutrophils Relative %: 78 %
Platelet Count: 176 10*3/uL (ref 150–400)
RBC: 4.82 MIL/uL (ref 4.22–5.81)
RDW: 14.4 % (ref 11.5–15.5)
WBC Count: 5.9 10*3/uL (ref 4.0–10.5)
nRBC: 0 % (ref 0.0–0.2)

## 2022-05-19 LAB — LACTATE DEHYDROGENASE: LDH: 158 U/L (ref 98–192)

## 2022-05-19 NOTE — Telephone Encounter (Signed)
Per Dr. Jacinto Reap- patient needs to be evaluated in [facility] today- cbc/metc/ ldh. I returned daughter's call. Daughter to speak to her mother to see if patient can come now and would call back to let us know.  Daughter called back at 67- pt's wife is uncertain that she can get pt to apt any sooner than 3 or 4 pm. Patient very frail and at high fall risk. Pt's wife fell recently trying to ambulate the patient. Daughter will go to pt's home and help pt's wife get pt in the care. Discussed that 3/4 pm today is not ideal apt time as the smc provider would not have enough time to workup/assess the patient and provide the supportive care. We would prefer pt to come no later than 2 or 230. I explained to daughter that if the electrolytes are low, we may/may not be able to correct any abnormal labs if they come at 4pm. We discussed that patient's family call 911 and transport pt to ER if patient is too weak to get in the car. Daughter is heading to patient's home now. She will let me know as soon as she arrives if they plan to come to cancer center. They will do their best to arrive by 230.

## 2022-05-19 NOTE — Telephone Encounter (Signed)
Daughter called reporting that patient has declined since last seen by medical onc.two weeks ago. He is not eating and he is falling every time he gets up. He is very weak. Please advise

## 2022-05-19 NOTE — Progress Notes (Signed)
Patient unable to collect urine today. Wife/pt given container and clean cath wipes, so that patient can collect urine sample at home.

## 2022-05-19 NOTE — Progress Notes (Signed)
Symptom Management [place] at [facility] [phone] [phone]) [phone]  Patient Care Team: [name], MD as PCP - General (Family Medicine) [name], MD as Consulting Physician (Family Medicine) [name], [name], MD (General Surgery) Thornton Park, DO as Consulting Physician (Radiation Oncology) Cammie Sickle, MD as Consulting Physician (Oncology)   NAME OF PATIENT: Curtis Andrade  [id number]  01-18-1946   DATE OF VISIT: 05/19/22  REASON FOR CONSULT: Hervey Wedig. is a 77 y.o. 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   INTERVAL HISTORY: Patient saw Dr. Rogue Bussing on 05/04/2022.  Decision was made to hold treatment due to visual changes with concern for possible retinitis.  Patient was referred to ophthalmology with report of normal exam other than noted diplopia.    Since stopping treatment, patient has had progressive fatigue, poor oral intake, visual changes, dizziness, and progressive weakness with several recent falls.  Patient presents to [facility] today for evaluation of same.  Today, patient denies any significant symptomatic complaints other than persistent fatigue and poor oral intake.  He has occasional headache.  He continues to endorse double vision.  Denies recent fevers or illnesses. Denies any easy bleeding or bruising. Reports poor appetite. Denies chest pain. Denies any nausea, vomiting, constipation, or diarrhea. Denies urinary complaints. Patient offers no further specific complaints today.   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 Baruch Gouty and Dr. Nadeen Landau   Phlebitis 1998   Primary cancer of right upper lobe of lung ([place]) 01/14/2017   Dr. Genevive Bi performed a wedge resection of RUL.    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/27/2016    PAST SURGICAL HISTORY:  Past Surgical History:  Procedure Laterality Date   COLONOSCOPY  2013   Dr Vira Agar   COLONOSCOPY WITH PROPOFOL N/A 12/23/2016   Procedure: COLONOSCOPY WITH PROPOFOL;  Surgeon: Robert Bellow, MD;  Location[facility] ENDOSCOPY;  Service: Endoscopy;  Laterality: N/A;   EYE SURGERY     HERNIA REPAIR Right 1990's   TENDON REPAIR Left 03/14/2019   Procedure: TENDON REPAIR AND GRAFT  TRANSFER EXTENSOR INDICIS PROPRIUS LEFT THUMB;  Surgeon: [name], MD;  Location: [place];  Service: Orthopedics;  Laterality: Left;  AXILLARY BLOCK   THORACOTOMY Right 01/14/2017   Procedure: RIGHT THORACOSCOPYWITH WIDE WEDGE RESECTION,  PREOP BROCHOSCOPY;  Surgeon: [name], MD;  Location: [facility] ORS;  Service: General;  Laterality: Right;   THROAT SURGERY  2010    HEMATOLOGY/ONCOLOGY HISTORY:  Oncology History Overview Note  # OCT 2018- STAGE I- A. 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  - PARENCHYMAL MARGIN APPEARS CLEAR; TUMOR WAS 1 CM FROM MARGIN BEFORE REMOVAL OF STAPLE LINE.; NO adjuvant therapy.   # DEC 2021-- METASTATIC CARCINOMA, COMPATIBLE WITH PULMONARY ADENOCARCINOMA [left shoulder Core Biopsy];  NOV 24th PET-right hilar and Hypermetabolic mediastinal lymphadenopathy; small pulmonary nodules; 2 metastatic lesions in the liver/  pancreatic head/left adrenal gland;  muscle and skeletal metastasis.  Subcutaneous nodule-positive for metastatic adeno lung.   #  DEC 2021-Brain MRI multiple subcentimeter enhancing brain mets no significant edema - NO SYMPTOMS;s/p RT eval [Dr.Blackburn/Crystal-s/p WBRT-  JAN 11th, 2022]  # JAN 14th, 2021-START DABRFENIB +MEKINIST [name] 2022- MUGA scan-EF-58%]; STOPPED MID OCT 2022- sec to acute pancreatitis  # NOV 7th, 2022- Enco+Bini.   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  Short course of Eliquis.smoking-quit 2008.  ---------------------------------------    DIAGNOSIS: LUNG CA  STAGE:  IV      ;GOALS:palliation     Primary cancer of right upper lobe of lung ([place])  04/09/2020 Cancer Staging   Staging form: Lung, AJCC 8th Edition - Clinical: Stage IVB (cN1, cM1c) - Signed by Cammie Sickle, MD on 04/09/2020     ALLERGIES:  has No Known Allergies.  MEDICATIONS:  Current Outpatient Medications  Medication Sig Dispense Refill   acetaminophen (TYLENOL) 500 MG tablet Take 500 mg by mouth every 6 (six) hours as needed.     Ascorbic Acid (VITAMIN C) 1000 MG tablet Take 1,000 mg by mouth daily.     aspirin EC 81 MG tablet Take 81 mg by mouth daily.     Calcium Carbonate-Vit D-Min (CALCIUM 1200 PO) Take 1 tablet by mouth daily.     folic acid (FOLVITE) 814 MCG tablet Take 800 mcg by mouth daily.     Multiple Vitamin (MULTIVITAMIN WITH MINERALS) TABS tablet Take 1 tablet by mouth daily. Senior Multivitamin.     Multiple Vitamins-Minerals (EMERGEN-C IMMUNE PO) Take 1 tablet by mouth 2 (two) times daily.     Omega-3 1000 MG CAPS Take 1 tablet by mouth 1 day or 1 dose.     omeprazole (PRILOSEC) 40 MG capsule Take by mouth.     saw palmetto 80 MG capsule Take 80 mg by mouth daily.     binimetinib (MEKTOVI) 15 MG tablet Take 3 tablets (45 mg total) by mouth 2 (two) times daily. (Patient not taking: Reported on 05/19/2022) 180 tablet 3   encorafenib (BRAFTOVI) 75 MG capsule Take 6 capsules (450 mg total) by mouth daily. (Patient not taking: Reported on 05/19/2022) 180 capsule 3   ergocalciferol (VITAMIN D2) 1.25 MG (50000 UT) capsule Take 1 capsule (50,000 Units  total) by mouth once a week. (Patient not taking: Reported on 05/04/2022) 12 capsule 1   No current facility-administered medications for this visit.    VITAL SIGNS: BP (!) 145/80   Pulse 68   Temp 97.6 F (36.4 C) (Tympanic)   Resp 18   Wt 199 lb (90.3 kg)   SpO2 100%   BMI 27.75 kg/m  Filed Weights   05/19/22 1445  Weight: 199 lb (90.3 kg)    Estimated body mass index is 27.75 kg/m as calculated from the following:   Height as of 04/03/22: 5\' 11"  (1.803 m).   Weight as of this encounter: 199 lb (90.3 kg).  LABS: CBC:    Component Value Date/Time   WBC 5.9 05/19/2022 1432   WBC 6.8 05/04/2022 1002   HGB 15.3 05/19/2022 1432   HCT 46.5 05/19/2022 1432   PLT 176 05/19/2022 1432   MCV 96.5 05/19/2022 1432   NEUTROABS 4.6 05/19/2022 1432   LYMPHSABS 0.7 05/19/2022 1432   MONOABS 0.4 05/19/2022 1432   EOSABS 0.1 05/19/2022 1432   BASOSABS 0.0 05/19/2022 1432   Comprehensive Metabolic Panel:    Component Value Date/Time   NA 137 05/19/2022 1432   K 3.9 05/19/2022 1432  CL 100 05/19/2022 1432   CO2 26 05/19/2022 1432   BUN 16 05/19/2022 1432   CREATININE 0.84 05/19/2022 1432   GLUCOSE 112 (H) 05/19/2022 1432   CALCIUM 9.1 05/19/2022 1432   AST 23 05/19/2022 1432   ALT 18 05/19/2022 1432   ALKPHOS 41 05/19/2022 1432   BILITOT 0.7 05/19/2022 1432   PROT 7.9 05/19/2022 1432   ALBUMIN 4.2 05/19/2022 1432    RADIOGRAPHIC STUDIES: CT CHEST W CONTRAST  Result Date: 05/19/2022 CLINICAL DATA:  Non-small-cell lung cancer metastatic. Assess treatment response. History of right upper lobectomy. On daily chemotherapy and immunotherapy * Tracking Code: BO * EXAM: CT CHEST WITH CONTRAST TECHNIQUE: Multidetector CT imaging of the chest was performed during intravenous contrast administration. RADIATION DOSE REDUCTION: This exam was performed according to the departmental dose-optimization program which includes automated exposure control, adjustment of the mA and/or kV according  to patient size and/or use of iterative reconstruction technique. CONTRAST:  65mL OMNIPAQUE IOHEXOL 300 MG/ML  SOLN COMPARISON:  CT 02/18/2022 and older FINDINGS: Cardiovascular: The thoracic aorta has a normal course and caliber with scattered vascular calcifications. Coronary artery calcifications are seen. Calcifications also seen along the aortic valve. The heart is nonenlarged. No pericardial effusion. Mediastinum/Nodes: Small thyroid gland. No specific abnormal lymph node enlargement seen in the axillary region, hilum or mediastinum. Stable prominent right hilar lymph node measuring 8 x 17 mm on series 2, image 68. Normal caliber thoracic esophagus. Lungs/Pleura: Centrilobular emphysematous lung changes are once again identified. No consolidation, pneumothorax or effusion. Some mild areas of bronchiectasis. Previously there is an ill-defined 8 x 6 mm nodule in the middle lobe. This same lesion is seen today but appears less confluence and slightly smaller measuring 7 by 3 mm. Series 3, image 81. Small nodule just caudal to this which measured 4 mm on the prior stable today on series 3, image 90. Smaller areas of nodularity in the posterior right upper lobe on image 61 and 63 are also stable. Upper Abdomen: Left adrenal gland continues to be thickened and nodular, unchanged from prior. Stone in the gallbladder. Hepatic cysts. Musculoskeletal: Scattered areas of bony sclerotic changes are once again identified along the spine, right clavicle, sternum and ribs in a similar distribution compared to prior CT consistent with osseous metastatic disease. Separate scattered degenerative changes. IMPRESSION: 1. Multiple smaller nodules are again identified, similar to previous. The dominant nodule in the middle lobe on the prior, today is slightly smaller 2. Scattered bony sclerotic changes are once again identified consistent with osseous metastatic disease. 3. Cholelithiasis. 4. Stable adrenal nodularity on the left.  Aortic Atherosclerosis (ICD10-I70.0) and Emphysema (ICD10-J43.9). Electronically Signed   By: Jill Side M.D.   On: 05/19/2022 12:39    PERFORMANCE STATUS (ECOG) : 3 - Symptomatic, >50% confined to bed  Review of Systems Unless otherwise noted, a complete review of systems is negative.  Physical Exam General: NAD Cardiovascular: regular rate and rhythm Pulmonary: clear ant fields Abdomen: soft, nontender, + bowel sounds GU: no suprapubic tenderness Extremities: no edema, no joint deformities Skin: no rashes Neurological: Weakness but otherwise nonfocal  IMPRESSION/PLAN: Weakness with recurrent falls -labs are largely within normal limits.  Benign exam but patient has neurological symptoms with dizziness, headaches, visual changes.  Discussed with Dr. Rogue Bussing and symptoms could reflect posttreatment effects from whole brain radiation.  However, will obtain stat MRI of the brain for further evaluation.  Discussed option with patient, wife, and both daughters (via phone) for transfer to the  hospital to facilitate workup but wife and patient adamantly declined.  Wife says that she will continue managing his care at home.  They are in agreement with referral to home health PT/OT. Discussed DME but wife says that they already have walker, wheelchair, bedside commode, and shower chair.  They are aware of potential catastrophic outcomes associated with falls.  Stage IV non-small cell lung cancer -CT chest yesterday showed multiple small nodules but slightly smaller dominant middle lobe nodule with scattered sclerotic changes consistent with osseous metastatic disease.  Patient will need follow-up with Dr. Rogue Bussing to discuss options for further treatment.  Poor oral intake -referral to nutrition  Follow up with Dr. B 1-2 days following MRI  Patient expressed understanding and was in agreement with this plan. He also understands that He can call clinic at any time with any questions,  concerns, or complaints.   Thank you for allowing me to participate in the care of this very pleasant patient.   Time Total: 25 minutes  Visit consisted of counseling and education dealing with the complex and emotionally intense issues of symptom management in the setting of serious illness.Greater than 50%  of this time was spent counseling and coordinating care related to the above assessment and plan.  Signed by: Altha Harm, PhD, NP-C

## 2022-05-20 ENCOUNTER — Ambulatory Visit
Admission: RE | Admit: 2022-05-20 | Discharge: 2022-05-20 | Disposition: A | Discharge status: Home / Self Care | Payer: HMO | Source: Ambulatory Visit | Attending: Hospice and Palliative Medicine | Admitting: Hospice and Palliative Medicine

## 2022-05-20 DIAGNOSIS — C3411 Malignant neoplasm of upper lobe, right bronchus or lung: Secondary | ICD-10-CM | POA: Insufficient documentation

## 2022-05-20 DIAGNOSIS — G319 Degenerative disease of nervous system, unspecified: Secondary | ICD-10-CM | POA: Diagnosis not present

## 2022-05-20 DIAGNOSIS — C7931 Secondary malignant neoplasm of brain: Secondary | ICD-10-CM | POA: Diagnosis not present

## 2022-05-20 DIAGNOSIS — G9389 Other specified disorders of brain: Secondary | ICD-10-CM | POA: Diagnosis not present

## 2022-05-20 MED ORDER — GADOBUTROL 1 MMOL/ML IV SOLN
7.5000 mL | Freq: Once | INTRAVENOUS | Status: AC | PRN
  Administered 2022-05-20: 7.5 mL via INTRAVENOUS

## 2022-05-21 ENCOUNTER — Encounter: Payer: Self-pay | Admitting: Internal Medicine

## 2022-05-21 DIAGNOSIS — C771 Secondary and unspecified malignant neoplasm of intrathoracic lymph nodes: Secondary | ICD-10-CM | POA: Diagnosis not present

## 2022-05-21 DIAGNOSIS — Z87891 Personal history of nicotine dependence: Secondary | ICD-10-CM | POA: Diagnosis not present

## 2022-05-21 DIAGNOSIS — C7931 Secondary malignant neoplasm of brain: Secondary | ICD-10-CM | POA: Diagnosis not present

## 2022-05-21 DIAGNOSIS — C787 Secondary malignant neoplasm of liver and intrahepatic bile duct: Secondary | ICD-10-CM | POA: Diagnosis not present

## 2022-05-21 DIAGNOSIS — C7951 Secondary malignant neoplasm of bone: Secondary | ICD-10-CM | POA: Diagnosis not present

## 2022-05-21 DIAGNOSIS — I498 Other specified cardiac arrhythmias: Secondary | ICD-10-CM | POA: Diagnosis not present

## 2022-05-21 DIAGNOSIS — Z7982 Long term (current) use of aspirin: Secondary | ICD-10-CM | POA: Diagnosis not present

## 2022-05-21 DIAGNOSIS — C7889 Secondary malignant neoplasm of other digestive organs: Secondary | ICD-10-CM | POA: Diagnosis not present

## 2022-05-21 DIAGNOSIS — C3411 Malignant neoplasm of upper lobe, right bronchus or lung: Secondary | ICD-10-CM | POA: Diagnosis not present

## 2022-05-21 DIAGNOSIS — L409 Psoriasis, unspecified: Secondary | ICD-10-CM | POA: Diagnosis not present

## 2022-05-21 DIAGNOSIS — C7972 Secondary malignant neoplasm of left adrenal gland: Secondary | ICD-10-CM | POA: Diagnosis not present

## 2022-05-21 DIAGNOSIS — H25011 Cortical age-related cataract, right eye: Secondary | ICD-10-CM | POA: Diagnosis not present

## 2022-05-21 DIAGNOSIS — Z8521 Personal history of malignant neoplasm of larynx: Secondary | ICD-10-CM | POA: Diagnosis not present

## 2022-05-21 DIAGNOSIS — Z79899 Other long term (current) drug therapy: Secondary | ICD-10-CM | POA: Diagnosis not present

## 2022-05-21 DIAGNOSIS — Z9181 History of falling: Secondary | ICD-10-CM | POA: Diagnosis not present

## 2022-05-22 ENCOUNTER — Inpatient Hospital Stay (HOSPITAL_BASED_OUTPATIENT_CLINIC_OR_DEPARTMENT_OTHER): Payer: HMO | Admitting: Internal Medicine

## 2022-05-22 ENCOUNTER — Inpatient Hospital Stay: Payer: HMO | Admitting: Internal Medicine

## 2022-05-22 ENCOUNTER — Inpatient Hospital Stay: Payer: HMO

## 2022-05-22 ENCOUNTER — Other Ambulatory Visit: Payer: Self-pay

## 2022-05-22 ENCOUNTER — Encounter: Payer: Self-pay | Admitting: Internal Medicine

## 2022-05-22 VITALS — BP 100/68 | HR 71 | Temp 96.7°F | Resp 18

## 2022-05-22 DIAGNOSIS — I951 Orthostatic hypotension: Secondary | ICD-10-CM

## 2022-05-22 DIAGNOSIS — C3411 Malignant neoplasm of upper lobe, right bronchus or lung: Secondary | ICD-10-CM | POA: Diagnosis not present

## 2022-05-22 DIAGNOSIS — R531 Weakness: Secondary | ICD-10-CM

## 2022-05-22 DIAGNOSIS — R34 Anuria and oliguria: Secondary | ICD-10-CM

## 2022-05-22 LAB — URINALYSIS, COMPLETE (UACMP) WITH MICROSCOPIC
Bilirubin Urine: NEGATIVE
Glucose, UA: NEGATIVE mg/dL
Ketones, ur: NEGATIVE mg/dL
Leukocytes,Ua: NEGATIVE
Nitrite: NEGATIVE
Protein, ur: NEGATIVE mg/dL
Specific Gravity, Urine: 1.023 (ref 1.005–1.030)
pH: 6 (ref 5.0–8.0)

## 2022-05-22 MED ORDER — DEXAMETHASONE 2 MG PO TABS: 2.0000 mg | ORAL_TABLET | Freq: Two times a day (BID) | ORAL | 0 refills | Status: DC

## 2022-05-22 NOTE — Progress Notes (Signed)
Patient denies new problems/concerns today.   °

## 2022-05-22 NOTE — Assessment & Plan Note (Addendum)
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 Other scattered areas of low-density in the liver are similarly unchanged.  Awaiting repeat CT scan in March.  # Recommend continue holding  B-RAF V600E inhibitor + MEK inhibitor-given patient's multitude of symptoms/unclear etiology of orthostatic hypotension.  # Falls-no syncope; likely multifactorial positive for orthostasis/gait ataxia; ?subclinical seizures.  Recent MRI brain negative for any progression of disease.  Check cortisol level; thyroid levels.  Recommend dexamethasone 2 mg twice daily.  And also compression stockings. Recommend evaluation with cardiology. Will discuss with Dr. Mickeal Skinner.   # Brain metastases: s/p WBRT [dec 2021]; MRI February 2024-negative for any acute process.  Unable to explain the falls.  Will discuss with Dr.Vaslow.  Awaiting MRI in April 2024.  # Vision/focussin difficulty- ? MEK inhibitor-recommend holding MEKinhibitor/B-raf inhibitor.  MEK inhibitors can cause serous retinitis. S/p Dr. Michelene Heady- stable.  # Bone lesions-mildly symptomatic-[30 mins; tylenol]. premedicated with Tylenol at home. Zometa every 4 months. Low vit D- NOV 2023- on OTC vit D.   [mon/-wed appt]   Zometa q 73m [nov23]  DISPOSITION:  # labs in AM - next week- ordered.  # refer to [facility]; cardiology re: orthostatic hypotension # follow up as planned- on March 5th- Dr.B

## 2022-05-22 NOTE — Progress Notes (Signed)
[facility] NOTE  Patient Care Team: [name], MD as PCP - General (Family Medicine) [name], MD as Consulting Physician (Family Medicine) [name], [name], MD (General Surgery) Thornton Park, DO as Consulting Physician (Radiation Oncology) Cammie Sickle, MD as Consulting Physician (Oncology)  CHIEF COMPLAINTS/PURPOSE OF CONSULTATION: Lung cancer  #  Oncology History Overview Note  # OCT 2018- STAGE I- A. 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  - PARENCHYMAL MARGIN APPEARS CLEAR; TUMOR WAS 1 CM FROM MARGIN BEFORE REMOVAL OF STAPLE LINE.; NO adjuvant therapy.   # DEC 2021-- METASTATIC CARCINOMA, COMPATIBLE WITH PULMONARY ADENOCARCINOMA [left shoulder Core Biopsy];  NOV 24th PET-right hilar and Hypermetabolic mediastinal lymphadenopathy; small pulmonary nodules; 2 metastatic lesions in the liver/  pancreatic head/left adrenal gland;  muscle and skeletal metastasis.  Subcutaneous nodule-positive for metastatic adeno lung.   # DEC 2021-Brain MRI multiple subcentimeter enhancing brain mets no significant edema - NO SYMPTOMS;s/p RT eval [Dr.Blackburn/Crystal-s/p WBRT- JAN 11th, 2022]  # JAN 14th, 2021-START DABRFENIB +MEKINIST [name] 2022- MUGA scan-EF-58%]; STOPPED MID OCT 2022- sec to acute pancreatitis  # NOV 7th, 2022- Enco+Bini.   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  Short course of Eliquis.smoking-quit 2008.  ---------------------------------------    DIAGNOSIS: LUNG CA  STAGE:  IV      ;GOALS:palliation     Primary cancer of right upper lobe of lung ([place])  04/09/2020 Cancer Staging   Staging form: Lung, AJCC 8th Edition - Clinical: Stage IVB (cN1, cM1c) - Signed by Cammie Sickle, MD on 04/09/2020     HISTORY OF PRESENTING  ILLNESS: Ambulating by independently.  Accompanied by his wife.  Daughter over the phone.  Curtis Andrade 77 y.o.  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  Patient was taken off Enco+Bini -approximately 2 weeks ago because of vision problems.  Patient s/p evaluation with ophthalmology-as per family no evidence of any serious issues.   However patient has had 5 falls in the last 1 week or so.  The fall started after stopping Enco+Bini.   No loss of consciousness.  As per the wife falls happened because of loss of balance.  As per the wife patient has been shuffling gait.  However on one occasion patient fell while trying to get up from the bed.   Review of Systems  Constitutional:  Positive for malaise/fatigue. Negative for chills, diaphoresis, fever and weight loss.  HENT:  Negative for nosebleeds and sore throat.   Eyes:  Negative for double vision.  Respiratory:  Negative for cough, hemoptysis, sputum production, shortness of breath and wheezing.   Cardiovascular:  Negative for chest pain, palpitations, orthopnea and leg swelling.  Gastrointestinal:  Negative for abdominal pain, blood in stool, constipation, diarrhea, heartburn, melena, nausea and vomiting.  Genitourinary:  Negative for dysuria, frequency and urgency.  Musculoskeletal:  Positive for back pain and joint pain.  Skin: Negative.  Negative for itching and rash.  Neurological:  Positive for weakness. Negative for dizziness, tingling, focal weakness and headaches.  Endo/Heme/Allergies:  Does not bruise/bleed easily.  Psychiatric/Behavioral:  Positive for memory loss. Negative for depression. The patient is not nervous/anxious and does not have insomnia.  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 Baruch Gouty and Dr. Nadeen Landau   Phlebitis 1998   Primary cancer of right upper lobe of lung ([place]) 01/14/2017   Dr. Genevive Bi performed a wedge resection of RUL.    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/27/2016    SURGICAL HISTORY: Past Surgical History:  Procedure Laterality Date   COLONOSCOPY  2013   Dr Vira Agar   COLONOSCOPY WITH PROPOFOL N/A 12/23/2016   Procedure: COLONOSCOPY WITH PROPOFOL;  Surgeon: Robert Bellow, MD;  Location: [facility] ENDOSCOPY;  Service: Endoscopy;  Laterality: N/A;   EYE SURGERY     HERNIA REPAIR Right 1990's   TENDON REPAIR Left 03/14/2019   Procedure: TENDON REPAIR AND GRAFT  TRANSFER EXTENSOR INDICIS PROPRIUS LEFT THUMB;  Surgeon: [name], MD;  Location: [facility];  Service: Orthopedics;  Laterality: Left;  AXILLARY BLOCK   THORACOTOMY Right 01/14/2017   Procedure: RIGHT THORACOSCOPYWITH WIDE WEDGE RESECTION,  PREOP BROCHOSCOPY;  Surgeon: [name], MD;  Location: [facility] ORS;  Service: General;  Laterality: Right;   THROAT SURGERY  2010    SOCIAL HISTORY: Social History   Socioeconomic History   Marital status: Married    Spouse name: Not on file   Number of children: Not on file   Years of education: Not on file   Highest education level: Not on file  Occupational History   Not on file  Tobacco Use   Smoking status: Former    Years: 50.00    Types: Cigarettes    Quit date: 03/31/2007    Years since quitting: 15.1   Smokeless tobacco: Never  Vaping Use   Vaping Use: Never used  Substance and Sexual Activity   Alcohol use: No   Drug use: No   Sexual activity: Yes    Birth control/protection: None    Comment: Married  Other Topics Concern   Not on file  Social History Narrative   Not on file   Social Determinants of Health   Financial  Resource Strain: Low Risk  (03/27/2022)   Overall Financial Resource Strain (CARDIA)    Difficulty of Paying Living Expenses: Not very hard  Food Insecurity: No Food Insecurity (03/27/2022)   Hunger Vital Sign    Worried About Running Out of Food in the Last Year: Never true    Ran Out of Food in the Last Year: Never true  Transportation Needs: No Transportation Needs (03/27/2022)   PRAPARE - Hydrologist (Medical): No    Lack of Transportation (Non-Medical): No  Physical Activity: Insufficiently Active (03/27/2022)   Exercise Vital Sign    Days of Exercise per Week: 2 days    Minutes of Exercise per Session: 30 min  Stress: Stress Concern Present (03/27/2022)   [facility]    Feeling of Stress : Rather much  Social Connections: Socially Integrated (03/27/2022)   Social Connection and Isolation Panel [NHANES]    Frequency of Communication with Friends and Family: More than three times a week    Frequency of Social Gatherings with Friends and Family: More than three times a week    Attends Religious Services: 1 to 4  times per year    Active Member of Clubs or Organizations: Yes    Attends Archivist Meetings: Never    Marital Status: Married  Human resources officer Violence: Not At Risk (03/27/2022)   Humiliation, Afraid, Rape, and Kick questionnaire    Fear of Current or Ex-Partner: No    Emotionally Abused: No    Physically Abused: No    Sexually Abused: No    FAMILY HISTORY: Family History  Problem Relation Age of Onset   Alzheimer's disease Mother    Alzheimer's disease Father    Stroke Father    Colon cancer Neg Hx     ALLERGIES:  has No Known Allergies.  MEDICATIONS:  Current Outpatient Medications  Medication Sig Dispense Refill   acetaminophen (TYLENOL) 500 MG tablet Take 500 mg by mouth every 6 (six) hours as needed.     dexamethasone (DECADRON) 2 MG tablet Take 1  tablet (2 mg total) by mouth 2 (two) times daily. 30 tablet 0   omeprazole (PRILOSEC) 40 MG capsule Take by mouth.     Ascorbic Acid (VITAMIN C) 1000 MG tablet Take 1,000 mg by mouth daily. (Patient not taking: Reported on 05/22/2022)     aspirin EC 81 MG tablet Take 81 mg by mouth daily. (Patient not taking: Reported on 05/22/2022)     binimetinib (MEKTOVI) 15 MG tablet Take 3 tablets (45 mg total) by mouth 2 (two) times daily. (Patient not taking: Reported on 05/19/2022) 180 tablet 3   Calcium Carbonate-Vit D-Min (CALCIUM 1200 PO) Take 1 tablet by mouth daily. (Patient not taking: Reported on 05/22/2022)     encorafenib (BRAFTOVI) 75 MG capsule Take 6 capsules (450 mg total) by mouth daily. (Patient not taking: Reported on 05/19/2022) 180 capsule 3   ergocalciferol (VITAMIN D2) 1.25 MG (50000 UT) capsule Take 1 capsule (50,000 Units total) by mouth once a week. (Patient not taking: Reported on 05/04/2022) 12 capsule 1   folic acid (FOLVITE) Q000111Q MCG tablet Take 800 mcg by mouth daily. (Patient not taking: Reported on 05/22/2022)     Multiple Vitamin (MULTIVITAMIN WITH MINERALS) TABS tablet Take 1 tablet by mouth daily. Senior Multivitamin. (Patient not taking: Reported on 05/22/2022)     Multiple Vitamins-Minerals (EMERGEN-C IMMUNE PO) Take 1 tablet by mouth 2 (two) times daily. (Patient not taking: Reported on 05/22/2022)     Omega-3 1000 MG CAPS Take 1 tablet by mouth 1 day or 1 dose. (Patient not taking: Reported on 05/22/2022)     saw palmetto 80 MG capsule Take 80 mg by mouth daily. (Patient not taking: Reported on 05/22/2022)     No current facility-administered medications for this visit.      Marland Kitchen  PHYSICAL EXAMINATION: ECOG PERFORMANCE STATUS: 0 - Asymptomatic  Vitals:   05/22/22 1527 05/22/22 1528  BP: 100/68 100/68  Pulse: 71 71  Resp:    Temp:       Filed Weights      Physical Exam HENT:     Head: Normocephalic and atraumatic.     Mouth/Throat:     Pharynx: No oropharyngeal  exudate.  Eyes:     Pupils: Pupils are equal, round, and reactive to light.  Cardiovascular:     Rate and Rhythm: Normal rate and regular rhythm.  Pulmonary:     Effort: Pulmonary effort is normal. No respiratory distress.     Breath sounds: Normal breath sounds. No wheezing.  Abdominal:     General: Bowel sounds are normal. There is no  distension.     Palpations: Abdomen is soft. There is no mass.     Tenderness: There is no abdominal tenderness. There is no guarding or rebound.  Musculoskeletal:        General: No tenderness. Normal range of motion.     Cervical back: Normal range of motion and neck supple.  Skin:    General: Skin is warm.  Neurological:     Mental Status: He is alert and oriented to person, place, and time.  Psychiatric:        Mood and Affect: Affect normal.   ;  LABORATORY DATA:  I have reviewed the data as listed Lab Results  Component Value Date   WBC 5.9 05/19/2022   HGB 15.3 05/19/2022   HCT 46.5 05/19/2022   MCV 96.5 05/19/2022   PLT 176 05/19/2022   Recent Labs    02/04/22 1017 05/04/22 1002 05/19/22 1432  NA 138 135 137  K 4.0 4.0 3.9  CL 104 97* 100  CO2 '26 27 26  '$ GLUCOSE 107* 131* 112*  BUN '16 15 16  '$ CREATININE 1.09 1.07 0.84  CALCIUM 9.1 9.2 9.1  GFRNONAA >60 >60 >60  PROT 7.2 8.0 7.9  ALBUMIN 3.9 4.2 4.2  AST '27 26 23  '$ ALT '17 18 18  '$ ALKPHOS 39 36* 41  BILITOT 0.6 0.4 0.7    RADIOGRAPHIC STUDIES: I have personally reviewed the radiological images as listed and agreed with the findings in the report. MR Brain W Wo Contrast  Result Date: 05/20/2022 CLINICAL DATA:  Brain metastases, assess treatment response EXAM: MRI HEAD WITHOUT AND WITH CONTRAST TECHNIQUE: Multiplanar, multiecho pulse sequences of the brain and surrounding structures were obtained without and with intravenous contrast. CONTRAST:  7.11m GADAVIST GADOBUTROL 1 MMOL/ML IV SOLN COMPARISON:  MRI head February 24, 2022. FINDINGS: Brain: Similar appearance of  numerous treated metastases and/or chronic microhemorrhages. No progressive edema or significant enhancement to suggest progression. Most conspicuous treated lesion in the left parietal lobe appears similar. No substantial change in confluent T2/FLAIR hyperintensity within the white matter. No evidence of acute infarct, acute hemorrhage, midline shift, or hydrocephalus. Cerebral atrophy. Vascular: Major arterial flow voids are maintained at the skull base. Skull and upper cervical spine: Normal marrow signal. Sinuses/Orbits: No acute findings. IMPRESSION: Stable exam without evidence of recurrent/progressive disease. Electronically Signed   By: FMargaretha SheffieldM.D.   On: 05/20/2022 15:49   CT CHEST W CONTRAST  Result Date: 05/19/2022 CLINICAL DATA:  Non-small-cell lung cancer metastatic. Assess treatment response. History of right upper lobectomy. On daily chemotherapy and immunotherapy * Tracking Code: BO * EXAM: CT CHEST WITH CONTRAST TECHNIQUE: Multidetector CT imaging of the chest was performed during intravenous contrast administration. RADIATION DOSE REDUCTION: This exam was performed according to the departmental dose-optimization program which includes automated exposure control, adjustment of the mA and/or kV according to patient size and/or use of iterative reconstruction technique. CONTRAST:  756mOMNIPAQUE IOHEXOL 300 MG/ML  SOLN COMPARISON:  CT 02/18/2022 and older FINDINGS: Cardiovascular: The thoracic aorta has a normal course and caliber with scattered vascular calcifications. Coronary artery calcifications are seen. Calcifications also seen along the aortic valve. The heart is nonenlarged. No pericardial effusion. Mediastinum/Nodes: Small thyroid gland. No specific abnormal lymph node enlargement seen in the axillary region, hilum or mediastinum. Stable prominent right hilar lymph node measuring 8 x 17 mm on series 2, image 68. Normal caliber thoracic esophagus. Lungs/Pleura: Centrilobular  emphysematous lung changes are once again identified. No consolidation, pneumothorax  or effusion. Some mild areas of bronchiectasis. Previously there is an ill-defined 8 x 6 mm nodule in the middle lobe. This same lesion is seen today but appears less confluence and slightly smaller measuring 7 by 3 mm. Series 3, image 81. Small nodule just caudal to this which measured 4 mm on the prior stable today on series 3, image 90. Smaller areas of nodularity in the posterior right upper lobe on image 61 and 63 are also stable. Upper Abdomen: Left adrenal gland continues to be thickened and nodular, unchanged from prior. Stone in the gallbladder. Hepatic cysts. Musculoskeletal: Scattered areas of bony sclerotic changes are once again identified along the spine, right clavicle, sternum and ribs in a similar distribution compared to prior CT consistent with osseous metastatic disease. Separate scattered degenerative changes. IMPRESSION: 1. Multiple smaller nodules are again identified, similar to previous. The dominant nodule in the middle lobe on the prior, today is slightly smaller 2. Scattered bony sclerotic changes are once again identified consistent with osseous metastatic disease. 3. Cholelithiasis. 4. Stable adrenal nodularity on the left. Aortic Atherosclerosis (ICD10-I70.0) and Emphysema (ICD10-J43.9). Electronically Signed   By: Jill Side M.D.   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 Other scattered areas of low-density in the liver are similarly  unchanged.  Awaiting repeat CT scan in March.  # Recommend continue holding  B-RAF V600E inhibitor + MEK inhibitor-given patient's multitude of symptoms/unclear etiology of orthostatic hypotension.  # Falls-no syncope; likely multifactorial positive for orthostasis/gait ataxia; ?subclinical seizures.  Recent MRI brain negative for any progression of disease.  Check cortisol level; thyroid levels.  Recommend dexamethasone 2 mg twice daily.  And also compression stockings. Recommend evaluation with cardiology. Will discuss with Dr. Mickeal Skinner.   # Brain metastases: s/p WBRT [dec 2021]; MRI February 2024-negative for any acute process.  Unable to explain the falls.  Will discuss with Dr.Vaslow.  Awaiting MRI in April 2024.  # Vision/focussin difficulty- ? MEK inhibitor-recommend holding MEKinhibitor/B-raf inhibitor.  MEK inhibitors can cause serous retinitis. S/p Dr. Michelene Heady- stable.  # Bone lesions-mildly symptomatic-[30 mins; tylenol]. premedicated with Tylenol at home. Zometa every 4 months. Low vit D- NOV 2023- on OTC vit D.   [mon/-wed appt]   Zometa q 82m[nov23]  DISPOSITION:  # labs in AM - next week- ordered.  [facility] # follow up as planned- on March 5th- Dr.B  All questions were answered. The patient knows to call the clinic with any problems, questions or concerns.    GCammie Sickle MD 05/22/2022 4:15 PM

## 2022-05-22 NOTE — Progress Notes (Signed)
Nutrition Assessment   Reason for Assessment:  Weight loss, poor intake   ASSESSMENT:  77 year old male with stage IV recurrent lung with mets.  Past medical history of laryngeal cancer 2010, thoracotomy.  Patient has been on Enco+Bini, but currently on hold.  Met with patient and wife following MD visit.  Patient reports appetite has been up and down recently.  For breakfast today ate pancake with 2 slices of bacon. Lunch was 1/2 sandwich with pineapple.  Does not like milk or "milky" shakes.  Daughter has ordered ensure clear shakes.  Denies nausea but does report constipation.  Does report issues with taste alterations while on chemotherapy but this has improved.  Noted seen in [facility].    Medications: prilosec, decadron   Labs: reviewed   Anthropometrics:   Height: 71 inches Weight: 199 lb on 2/20 02/27/2022 208 lb 203 lb on 05/06/21 BMI: 27  4% weight loss in the last 2 1/2 months, concerning.    NUTRITION DIAGNOSIS: Inadequate oral intake related to cancer and related treatment side effects as evidenced by 4% weight loss in the last 2 1/2 months and up and down appetite   INTERVENTION:  Encouraged High Calorie, High Protein Diet and maximize intake on "good" days.  Handout provided  Continue ensure clear shakes.  Boost Breeze and Soothe are "juice" options as well and both higher in calories than retail version of ensure clear.   Encouraged MVI daily Encouraged consistent use of stool softner to help with constipation.   Contact information provided   MONITORING, EVALUATION, GOAL: weight trends, intake   Next Visit: Tuesday, March 5 during infusion  Sharlie Shreffler B. Zenia Resides, [employer], [place] Registered Dietitian [phone]

## 2022-05-23 LAB — URINE CULTURE: Culture: NO GROWTH

## 2022-05-25 ENCOUNTER — Telehealth: Payer: Self-pay | Admitting: *Deleted

## 2022-05-25 NOTE — Telephone Encounter (Signed)
Call asking for approval for occupational therapy 2 wk 6

## 2022-05-25 NOTE — Telephone Encounter (Signed)
Curtis Andrade from [facility] called asking for continuation of Physical Therapist orders 2 wk 4, 1 wk 4 Please return his call with verbal order

## 2022-05-26 ENCOUNTER — Inpatient Hospital Stay: Payer: HMO | Admitting: Licensed Clinical Social Worker

## 2022-05-26 ENCOUNTER — Other Ambulatory Visit: Payer: Self-pay

## 2022-05-26 ENCOUNTER — Telehealth: Payer: Self-pay | Admitting: *Deleted

## 2022-05-26 ENCOUNTER — Ambulatory Visit: Payer: HMO

## 2022-05-26 ENCOUNTER — Emergency Department: Payer: HMO

## 2022-05-26 ENCOUNTER — Inpatient Hospital Stay: Payer: HMO

## 2022-05-26 ENCOUNTER — Encounter: Payer: Self-pay | Admitting: Internal Medicine

## 2022-05-26 ENCOUNTER — Inpatient Hospital Stay
Admission: EM | Admit: 2022-05-26 | Discharge: 2022-05-27 | DRG: 951 | Disposition: A | Discharge status: Hospice | Payer: HMO | Attending: Obstetrics and Gynecology | Admitting: Obstetrics and Gynecology

## 2022-05-26 DIAGNOSIS — C7972 Secondary malignant neoplasm of left adrenal gland: Secondary | ICD-10-CM | POA: Diagnosis present

## 2022-05-26 DIAGNOSIS — Z79899 Other long term (current) drug therapy: Secondary | ICD-10-CM

## 2022-05-26 DIAGNOSIS — R531 Weakness: Secondary | ICD-10-CM | POA: Diagnosis present

## 2022-05-26 DIAGNOSIS — I10 Essential (primary) hypertension: Secondary | ICD-10-CM | POA: Diagnosis present

## 2022-05-26 DIAGNOSIS — Z7982 Long term (current) use of aspirin: Secondary | ICD-10-CM

## 2022-05-26 DIAGNOSIS — Z515 Encounter for palliative care: Secondary | ICD-10-CM | POA: Diagnosis not present

## 2022-05-26 DIAGNOSIS — C3411 Malignant neoplasm of upper lobe, right bronchus or lung: Secondary | ICD-10-CM | POA: Diagnosis not present

## 2022-05-26 DIAGNOSIS — I491 Atrial premature depolarization: Secondary | ICD-10-CM | POA: Diagnosis not present

## 2022-05-26 DIAGNOSIS — Z87891 Personal history of nicotine dependence: Secondary | ICD-10-CM | POA: Diagnosis not present

## 2022-05-26 DIAGNOSIS — L409 Psoriasis, unspecified: Secondary | ICD-10-CM | POA: Diagnosis present

## 2022-05-26 DIAGNOSIS — R0902 Hypoxemia: Secondary | ICD-10-CM | POA: Diagnosis not present

## 2022-05-26 DIAGNOSIS — R059 Cough, unspecified: Secondary | ICD-10-CM | POA: Diagnosis not present

## 2022-05-26 DIAGNOSIS — Z823 Family history of stroke: Secondary | ICD-10-CM | POA: Diagnosis not present

## 2022-05-26 DIAGNOSIS — C7889 Secondary malignant neoplasm of other digestive organs: Secondary | ICD-10-CM | POA: Diagnosis present

## 2022-05-26 DIAGNOSIS — R4189 Other symptoms and signs involving cognitive functions and awareness: Secondary | ICD-10-CM | POA: Diagnosis present

## 2022-05-26 DIAGNOSIS — R066 Hiccough: Secondary | ICD-10-CM | POA: Diagnosis present

## 2022-05-26 DIAGNOSIS — I951 Orthostatic hypotension: Secondary | ICD-10-CM | POA: Diagnosis present

## 2022-05-26 DIAGNOSIS — C7951 Secondary malignant neoplasm of bone: Secondary | ICD-10-CM | POA: Diagnosis present

## 2022-05-26 DIAGNOSIS — Z8521 Personal history of malignant neoplasm of larynx: Secondary | ICD-10-CM

## 2022-05-26 DIAGNOSIS — R4182 Altered mental status, unspecified: Secondary | ICD-10-CM | POA: Diagnosis not present

## 2022-05-26 DIAGNOSIS — C771 Secondary and unspecified malignant neoplasm of intrathoracic lymph nodes: Secondary | ICD-10-CM | POA: Diagnosis present

## 2022-05-26 DIAGNOSIS — Z923 Personal history of irradiation: Secondary | ICD-10-CM

## 2022-05-26 DIAGNOSIS — K3189 Other diseases of stomach and duodenum: Secondary | ICD-10-CM | POA: Diagnosis present

## 2022-05-26 DIAGNOSIS — Z820 Family history of epilepsy and other diseases of the nervous system: Secondary | ICD-10-CM | POA: Diagnosis not present

## 2022-05-26 DIAGNOSIS — Z743 Need for continuous supervision: Secondary | ICD-10-CM | POA: Diagnosis not present

## 2022-05-26 DIAGNOSIS — Z902 Acquired absence of lung [part of]: Secondary | ICD-10-CM | POA: Diagnosis not present

## 2022-05-26 DIAGNOSIS — C7931 Secondary malignant neoplasm of brain: Secondary | ICD-10-CM | POA: Diagnosis present

## 2022-05-26 DIAGNOSIS — C787 Secondary malignant neoplasm of liver and intrahepatic bile duct: Secondary | ICD-10-CM | POA: Diagnosis present

## 2022-05-26 DIAGNOSIS — Z66 Do not resuscitate: Secondary | ICD-10-CM | POA: Diagnosis present

## 2022-05-26 DIAGNOSIS — Z8719 Personal history of other diseases of the digestive system: Secondary | ICD-10-CM

## 2022-05-26 DIAGNOSIS — K920 Hematemesis: Secondary | ICD-10-CM | POA: Diagnosis present

## 2022-05-26 DIAGNOSIS — C3490 Malignant neoplasm of unspecified part of unspecified bronchus or lung: Secondary | ICD-10-CM

## 2022-05-26 DIAGNOSIS — Z85118 Personal history of other malignant neoplasm of bronchus and lung: Secondary | ICD-10-CM | POA: Diagnosis not present

## 2022-05-26 DIAGNOSIS — Z8672 Personal history of thrombophlebitis: Secondary | ICD-10-CM

## 2022-05-26 DIAGNOSIS — K5900 Constipation, unspecified: Secondary | ICD-10-CM | POA: Diagnosis present

## 2022-05-26 DIAGNOSIS — Z7952 Long term (current) use of systemic steroids: Secondary | ICD-10-CM

## 2022-05-26 LAB — CBC WITH DIFFERENTIAL/PLATELET
Abs Immature Granulocytes: 0.06 10*3/uL (ref 0.00–0.07)
Basophils Absolute: 0 10*3/uL (ref 0.0–0.1)
Basophils Relative: 0 %
Eosinophils Absolute: 0 10*3/uL (ref 0.0–0.5)
Eosinophils Relative: 0 %
HCT: 46.2 % (ref 39.0–52.0)
Hemoglobin: 15.2 g/dL (ref 13.0–17.0)
Immature Granulocytes: 0 %
Lymphocytes Relative: 3 %
Lymphs Abs: 0.5 10*3/uL — ABNORMAL LOW (ref 0.7–4.0)
MCH: 31.5 pg (ref 26.0–34.0)
MCHC: 32.9 g/dL (ref 30.0–36.0)
MCV: 95.9 fL (ref 80.0–100.0)
Monocytes Absolute: 1 10*3/uL (ref 0.1–1.0)
Monocytes Relative: 6 %
Neutro Abs: 13.2 10*3/uL — ABNORMAL HIGH (ref 1.7–7.7)
Neutrophils Relative %: 91 %
Platelets: 191 10*3/uL (ref 150–400)
RBC: 4.82 MIL/uL (ref 4.22–5.81)
RDW: 14.6 % (ref 11.5–15.5)
WBC: 14.8 10*3/uL — ABNORMAL HIGH (ref 4.0–10.5)
nRBC: 0 % (ref 0.0–0.2)

## 2022-05-26 LAB — COMPREHENSIVE METABOLIC PANEL
ALT: 18 U/L (ref 0–44)
AST: 23 U/L (ref 15–41)
Albumin: 4.2 g/dL (ref 3.5–5.0)
Alkaline Phosphatase: 44 U/L (ref 38–126)
Anion gap: 7 (ref 5–15)
BUN: 20 mg/dL (ref 8–23)
CO2: 30 mmol/L (ref 22–32)
Calcium: 9.7 mg/dL (ref 8.9–10.3)
Chloride: 96 mmol/L — ABNORMAL LOW (ref 98–111)
Creatinine, Ser: 0.93 mg/dL (ref 0.61–1.24)
GFR, Estimated: >60 mL/min (ref >60)
Glucose, Bld: 127 mg/dL — ABNORMAL HIGH (ref 70–99)
Potassium: 3.9 mmol/L (ref 3.5–5.1)
Sodium: 133 mmol/L — ABNORMAL LOW (ref 135–145)
Total Bilirubin: 1 mg/dL (ref 0.3–1.2)
Total Protein: 7.9 g/dL (ref 6.5–8.1)

## 2022-05-26 LAB — TROPONIN I (HIGH SENSITIVITY): Troponin I (High Sensitivity): 14 ng/L

## 2022-05-26 LAB — URINALYSIS, ROUTINE W REFLEX MICROSCOPIC
Bilirubin Urine: NEGATIVE
Glucose, UA: NEGATIVE mg/dL
Ketones, ur: NEGATIVE mg/dL
Leukocytes,Ua: NEGATIVE
Nitrite: NEGATIVE
Protein, ur: NEGATIVE mg/dL
Specific Gravity, Urine: 1.013 (ref 1.005–1.030)
Squamous Epithelial / HPF: NONE SEEN /HPF (ref 0–5)
pH: 7 (ref 5.0–8.0)

## 2022-05-26 LAB — MAGNESIUM: Magnesium: 2.2 mg/dL (ref 1.7–2.4)

## 2022-05-26 MED ORDER — GLYCOPYRROLATE 0.2 MG/ML IJ SOLN
0.2000 mg | INTRAMUSCULAR | Status: DC | PRN
  Administered 2022-05-26: 0.2 mg via INTRAVENOUS
  Filled 2022-05-26: qty 1

## 2022-05-26 MED ORDER — DEXAMETHASONE 4 MG PO TABS
2.0000 mg | ORAL_TABLET | Freq: Two times a day (BID) | ORAL | Status: DC
  Administered 2022-05-26 – 2022-05-27 (×2): 2 mg via ORAL
  Filled 2022-05-26 (×2): qty 1

## 2022-05-26 MED ORDER — ACETAMINOPHEN 650 MG RE SUPP: 650.0000 mg | Freq: Four times a day (QID) | RECTAL | Status: DC | PRN

## 2022-05-26 MED ORDER — ACETAMINOPHEN 325 MG PO TABS: 650.0000 mg | ORAL_TABLET | Freq: Four times a day (QID) | ORAL | Status: DC | PRN

## 2022-05-26 MED ORDER — MORPHINE SULFATE (CONCENTRATE) 10 MG/0.5ML PO SOLN
5.0000 mg | ORAL | Status: DC | PRN
  Administered 2022-05-27: 5 mg via SUBLINGUAL

## 2022-05-26 MED ORDER — LACTATED RINGERS IV BOLUS
1000.0000 mL | Freq: Once | INTRAVENOUS | Status: AC
  Administered 2022-05-26: 1000 mL via INTRAVENOUS

## 2022-05-26 MED ORDER — LORAZEPAM 2 MG/ML PO CONC
1.0000 mg | ORAL | Status: DC | PRN
  Administered 2022-05-26: 1 mg via SUBLINGUAL
  Filled 2022-05-26: qty 1

## 2022-05-26 MED ORDER — BISACODYL 10 MG RE SUPP: 10.0000 mg | Freq: Every day | RECTAL | Status: DC | PRN

## 2022-05-26 MED ORDER — ONDANSETRON HCL 4 MG PO TABS: 4.0000 mg | ORAL_TABLET | Freq: Four times a day (QID) | ORAL | Status: DC | PRN

## 2022-05-26 MED ORDER — MORPHINE SULFATE (CONCENTRATE) 10 MG/0.5ML PO SOLN
5.0000 mg | ORAL | Status: DC | PRN
  Filled 2022-05-26: qty 0.5

## 2022-05-26 MED ORDER — PANTOPRAZOLE SODIUM 40 MG PO TBEC
40.0000 mg | DELAYED_RELEASE_TABLET | Freq: Two times a day (BID) | ORAL | Status: DC
  Administered 2022-05-26 – 2022-05-27 (×2): 40 mg via ORAL
  Filled 2022-05-26 (×2): qty 1

## 2022-05-26 MED ORDER — LORAZEPAM 1 MG PO TABS: 1.0000 mg | ORAL_TABLET | ORAL | Status: DC | PRN

## 2022-05-26 MED ORDER — ONDANSETRON HCL 4 MG/2ML IJ SOLN
4.0000 mg | Freq: Four times a day (QID) | INTRAMUSCULAR | Status: DC | PRN
  Administered 2022-05-26: 4 mg via INTRAVENOUS
  Filled 2022-05-26: qty 2

## 2022-05-26 NOTE — H&P (Addendum)
History and Physical    Patient: Curtis Andrade. BD:[id number] DOB: 1946-01-15 DOA: 05/26/2022 DOS: the patient was seen and examined on 05/26/2022 PCP: [name], MD  Patient coming from: Home  Chief Complaint:  Chief Complaint  Patient presents with   Weakness   HPI: [name]. is a 77 y.o. 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  History of pain from patient and his family at bedside.  [name] states at this time, he is feeling comfortable.  He did have some vomiting since arriving to the ED but is not currently nauseous.  He denies any pain at this time.  Patient's wife, [name], states that [name] has been gradually declining for several weeks now.  He has become weaker and has suffered from low blood pressure.  Last night, he began to have multiple episodes of vomiting and became significantly weak.  He slid off the bed and she was unable to pick him up off the ground.  Due to this, EMS was called.  In addition, patient has been constipated for the last few weeks, and has not had a bowel movement in 3 days.  ED course: On arrival to the ED, patient was hypertensive at 144/78 with heart rate of 77.  He was saturating at 96% on room air.  He was afebrile at 97.5.  Initial workup remarkable for WBC of 14.8, hemoglobin of 15.2, platelets of 191, sodium of 133, glucose 127, creatinine 0.93, AST 23, ALT 18, GFR above 60.  Troponin negative at 14.  Urinalysis with small hemoglobin, negative leukocytes, negative nitrites, many bacteria, and 11-20 RBCs/hpf. CT of the head was obtained that did not show any acute intracranial abnormality.  Chest x-ray was obtained that showed no acute cardiopulmonary process.  Palliative care was in contact with EDP with decision to move towards home  hospice.  Due to lack of resources in place at home, [facility] contacted for observation admission.   Review of Systems: As mentioned in the history of present illness. All other systems reviewed and are negative.  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 Baruch Gouty and Dr. Nadeen Landau   Phlebitis 1998   Primary cancer of right upper lobe of lung ([place]) 01/14/2017   Dr. Genevive Bi performed a wedge resection of RUL.    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/27/2016   Past Surgical History:  Procedure Laterality Date   COLONOSCOPY  2013   Dr Vira Agar   COLONOSCOPY WITH PROPOFOL N/A 12/23/2016   Procedure: COLONOSCOPY WITH PROPOFOL;  Surgeon: Robert Bellow, MD;  Location: [facility] ENDOSCOPY;  Service: Endoscopy;  Laterality: N/A;   EYE SURGERY     HERNIA REPAIR Right 1990's   TENDON REPAIR Left 03/14/2019   Procedure: TENDON REPAIR AND GRAFT  TRANSFER EXTENSOR INDICIS PROPRIUS LEFT THUMB;  Surgeon: [name], MD;  Location: [facility];  Service: Orthopedics;  Laterality: Left;  AXILLARY BLOCK   THORACOTOMY Right 01/14/2017   Procedure: RIGHT THORACOSCOPYWITH WIDE WEDGE RESECTION,  PREOP BROCHOSCOPY;  Surgeon: [name], MD;  Location: [facility] ORS;  Service: General;  Laterality: Right;   THROAT SURGERY  2010   Social History:  reports that he quit smoking about 15 years ago. His smoking use included cigarettes. He has never used smokeless tobacco. He reports that he does not drink alcohol and does not use drugs.  No Known Allergies  Family History  Problem Relation Age of Onset   Alzheimer's disease Mother    Alzheimer's disease Father    Stroke Father    Colon cancer Neg Hx     Prior to Admission medications   Medication Sig Start Date End Date  Taking? Authorizing Provider  acetaminophen (TYLENOL) 500 MG tablet Take 500 mg by mouth every 6 (six) hours as needed.    [provider]  Ascorbic Acid (VITAMIN C) 1000 MG tablet Take 1,000 mg by mouth daily. Patient not taking: Reported on 05/22/2022    [provider]  aspirin EC 81 MG tablet Take 81 mg by mouth daily. Patient not taking: Reported on 05/22/2022    [provider]  binimetinib (MEKTOVI) 15 MG tablet Take 3 tablets (45 mg total) by mouth 2 (two) times daily. Patient not taking: Reported on 05/19/2022 12/04/21   Darl Pikes, RPH-CPP  Calcium Carbonate-Vit D-Min (CALCIUM 1200 PO) Take 1 tablet by mouth daily. Patient not taking: Reported on 05/22/2022    [provider]  dexamethasone (DECADRON) 2 MG tablet Take 1 tablet (2 mg total) by mouth 2 (two) times daily. 05/22/22   Cammie Sickle, MD  encorafenib (BRAFTOVI) 75 MG capsule Take 6 capsules (450 mg total) by mouth daily. Patient not taking: Reported on 05/19/2022 12/04/21   Darl Pikes, RPH-CPP  ergocalciferol (VITAMIN D2) 1.25 MG (50000 UT) capsule Take 1 capsule (50,000 Units total) by mouth once a week. Patient not taking: Reported on 05/04/2022 02/27/22   Cammie Sickle, MD  folic acid (FOLVITE) Q000111Q MCG tablet Take 800 mcg by mouth daily. Patient not taking: Reported on 05/22/2022    [provider]  Multiple Vitamin (MULTIVITAMIN WITH MINERALS) TABS tablet Take 1 tablet by mouth daily. Senior Multivitamin. Patient not taking: Reported on 05/22/2022    [provider]  Multiple Vitamins-Minerals (EMERGEN-C IMMUNE PO) Take 1 tablet by mouth 2 (two) times daily. Patient not taking: Reported on 05/22/2022    [provider]  Omega-3 1000 MG CAPS Take 1 tablet by mouth 1 day or 1 dose. Patient not taking: Reported on 05/22/2022    [provider]  omeprazole (PRILOSEC) 40 MG capsule Take by mouth. 11/21/20   [provider]  saw  palmetto 80 MG capsule Take 80 mg by mouth daily. Patient not taking: Reported on 05/22/2022    [provider]    Physical Exam: Vitals:   05/26/22 1100 05/26/22 1115 05/26/22 1200 05/26/22 1219  BP: (!) 177/87  (!) 173/82   Pulse: 78 76 75   Resp: '13 18 13   '$ Temp:   97.9 F (36.6 C)   TempSrc:   Oral   SpO2:   100% 97%  Weight:      Height:       Physical Exam Vitals and nursing note reviewed.  Constitutional:      General: He is not in acute distress.    Appearance: He is normal weight.  HENT:     Head: Normocephalic and atraumatic.  Cardiovascular:     Rate and Rhythm: Normal rate and regular rhythm.     Heart sounds: No murmur heard.  Pulmonary:     Effort: Pulmonary effort is normal.     Breath sounds: Normal breath sounds.  Abdominal:     General: There is no distension.     Tenderness: There is no abdominal tenderness.     Comments: Mildly rigid on palpation consistent with constipation history  Skin:    General: Skin is warm and dry.  Neurological:     General: No focal deficit present.     Mental Status: He is alert. Mental status is at baseline.  Psychiatric:        Mood and Affect: Mood normal.        Behavior: Behavior normal.        Thought Content: Thought content normal.        Judgment: Judgment normal.    Data Reviewed: CBC with WBC of 14.8, hemoglobin of 15.2, platelets of 191 CMP with sodium 133, potassium 3.9, chloride 96, bicarb 30, glucose 127, creatinine 0.93 with BUN of 20, AST 23, ALT 18 and GFR above 60 Troponin negative at 14 Urinalysis with small hemoglobin, 11-20 RBCs/hpf, negative leukocytes and nitrites, many bacteria, and 0-5 WBC/hpf.  EKG personally reviewed.  Sinus rhythm with rate of 75.  First-degree AV block.  Multiple PVCs.  DG Chest 2 View  Result Date: 05/26/2022 CLINICAL DATA:  Weakness, productive cough EXAM: CHEST - 2 VIEW COMPARISON:  CT 05/18/2022 FINDINGS: Unchanged cardiomediastinal silhouette. There is no  focal airspace consolidation. No pleural effusion or evidence of pneumothorax. Thoracic spondylosis. No acute osseous abnormality. There are scattered sclerotic lesions and small pulmonary nodules best seen on recent chest CT, compatible with known metastatic disease. IMPRESSION: No evidence of acute cardiopulmonary disease. Electronically Signed   By: Maurine Simmering M.D.   On: 05/26/2022 10:06   CT Head Wo Contrast  Result Date: 05/26/2022 CLINICAL DATA:  Mental status changes. EXAM: CT HEAD WITHOUT CONTRAST TECHNIQUE: Contiguous axial images were obtained from the base of the skull through the vertex without intravenous contrast. RADIATION DOSE REDUCTION: This exam was performed according to the departmental dose-optimization program which includes automated exposure control, adjustment of the mA and/or kV according to patient size and/or use of iterative reconstruction technique. COMPARISON:  MRI brain 05/20/2022. FINDINGS: Brain: There is no evidence for acute hemorrhage, hydrocephalus, mass lesion, or abnormal extra-axial fluid collection. No definite CT evidence for acute infarction. Diffuse loss of parenchymal volume is consistent with atrophy. Patchy low attenuation in the deep hemispheric and periventricular white matter is nonspecific, but likely reflects chronic microvascular ischemic demyelination. Vascular: No hyperdense vessel or unexpected calcification. Skull: No evidence for fracture. No worrisome lytic or sclerotic lesion. Sinuses/Orbits: The visualized paranasal sinuses and mastoid air cells are clear. Visualized portions of the globes and intraorbital fat are unremarkable. Other: None. IMPRESSION: 1. No acute intracranial abnormality. 2. Atrophy with chronic small vessel ischemic disease. Electronically Signed   By: Misty Stanley M.D.   On: 05/26/2022 09:55    There are no new results to review at this time.  Assessment and Plan:  * End of life care Patient has been gradually declining  over the last several weeks to months with increasing generalized weakness, orthostatic hypotension, and declining cognitive function.  Mr. Facchini and his family have made the decision to move into hospice care with the shift to providing comfort and quality of life rather than trying to treat his medical conditions.  At this time, in-hospital death is not expected with plan to transition to home hospice  -  Palliative care following; appreciate their recommendations and assistance - Plan for home hospice at this time - Continue home dexamethasone - Morphine as needed for pain control or dyspnea - Zofran as needed for nausea - Dulcolax suppositories as needed for constipation  Primary cancer of right upper lobe of lung ([place]) Patient has a history of stage IVb recurrent adenocarcinoma of the lung with metastasis to the brain and bone.  Currently transitioning to hospice.  Hematemesis Patient had multiple episodes of vomiting overnight and a brown/black emesis episode in the ED. likely secondary to Mallory-Weiss versus gastritis.  Given transition to comfort measures, no indication for GI consultation or EGD at this time.  Will manage with Protonix daily to provide relief of any gastric irritation.  - Protonix 40 mg p.o. twice daily  Advance Care Planning: DNR/DNI.  Patient's family would like to transition to providing comfort and quality of life rather than quantity.  They have made the decision to enter hospice care.  Consults: Palliative care  Family Communication: Patient's wife and daughter updated at bedside  Severity of Illness: The appropriate patient status for this patient is OBSERVATION. Observation status is judged to be reasonable and necessary in order to provide the required intensity of service to ensure the patient's safety. The patient's presenting symptoms, physical exam findings, and initial radiographic and laboratory data in the context of their medical condition is felt  to place them at decreased risk for further clinical deterioration. Furthermore, it is anticipated that the patient will be medically stable for discharge from the hospital within 2 midnights of admission.   Author: Jose Persia, MD 05/26/2022 1:08 PM  For on call review [email or web address].

## 2022-05-26 NOTE — Assessment & Plan Note (Signed)
Patient has a history of stage IVb recurrent adenocarcinoma of the lung with metastasis to the brain and bone.  Currently transitioning to hospice.

## 2022-05-26 NOTE — Consult Note (Signed)
[place] at [facility] Telephone:(336) [phone] Fax:(336) [phone]   Name: Curtis Andrade. Date: 05/26/2022 MRN: [id number]  DOB: 1946-03-12  Patient Care Team: [name], MD as PCP - General (Family Medicine) [name], MD as Consulting Physician (Family Medicine) [name], [name], MD (General Surgery) Thornton Park, DO as Consulting Physician (Radiation Oncology) Cammie Sickle, MD as Consulting Physician (Oncology)    REASON FOR CONSULTATION: Curtis Omdahl. is a 77 y.o. 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  Treatment has been on hold due to poor tolerance.  Patient has had progressive weakness and presented to the ED on 05/26/2022 for evaluation of same.  Palliative care was consulted to address goals.   SOCIAL HISTORY:     reports that he quit smoking about 15 years ago. His smoking use included cigarettes. He has never used smokeless tobacco. He reports that he does not drink alcohol and does not use drugs.  Patient is married lives at home with his wife.  He has two daughters who are involved in his care.  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 Baruch Gouty and Dr. Nadeen Landau   Phlebitis 1998   Primary cancer of right upper lobe of lung ([place]) 01/14/2017   Dr. Genevive Bi performed a wedge resection of RUL.    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/27/2016    PAST SURGICAL HISTORY:  Past Surgical History:   Procedure Laterality Date   COLONOSCOPY  2013   Dr Vira Agar   COLONOSCOPY WITH PROPOFOL N/A 12/23/2016   Procedure: COLONOSCOPY WITH PROPOFOL;  Surgeon: Robert Bellow, MD;  Location: [facility] ENDOSCOPY;  Service: Endoscopy;  Laterality: N/A;   EYE SURGERY     HERNIA REPAIR Right 1990's   TENDON REPAIR Left 03/14/2019   Procedure: TENDON REPAIR AND GRAFT  TRANSFER EXTENSOR INDICIS PROPRIUS LEFT THUMB;  Surgeon: [name], MD;  Location: [place];  Service: Orthopedics;  Laterality: Left;  AXILLARY BLOCK   THORACOTOMY Right 01/14/2017   Procedure: RIGHT THORACOSCOPYWITH WIDE WEDGE RESECTION,  PREOP BROCHOSCOPY;  Surgeon: [name], MD;  Location: [facility] ORS;  Service: General;  Laterality: Right;   THROAT SURGERY  2010    HEMATOLOGY/ONCOLOGY HISTORY:  Oncology History Overview Note  # OCT 2018- STAGE I- A. 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  - PARENCHYMAL MARGIN APPEARS CLEAR; TUMOR WAS 1 CM FROM MARGIN BEFORE REMOVAL OF STAPLE LINE.; NO adjuvant therapy.   # DEC 2021-- METASTATIC CARCINOMA, COMPATIBLE WITH PULMONARY ADENOCARCINOMA [left shoulder Core Biopsy];  NOV 24th PET-right hilar and Hypermetabolic mediastinal lymphadenopathy; small pulmonary nodules; 2 metastatic lesions in the liver/  pancreatic head/left adrenal gland;  muscle and skeletal metastasis.  Subcutaneous nodule-positive for metastatic adeno lung.   # DEC 2021-Brain MRI multiple subcentimeter enhancing brain mets no significant edema - NO SYMPTOMS;s/p RT eval [Dr.Blackburn/Crystal-s/p WBRT- JAN 11th, 2022]  # JAN 14th, 2021-START DABRFENIB +  MEKINIST [name] 2022- MUGA scan-EF-58%]; STOPPED MID OCT 2022- sec to acute pancreatitis  # NOV 7th, 2022- Enco+Bini.   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  Short  course of Eliquis.smoking-quit 2008.  ---------------------------------------    DIAGNOSIS: LUNG CA  STAGE:  IV      ;GOALS:palliation     Primary cancer of right upper lobe of lung ([place])  04/09/2020 Cancer Staging   Staging form: Lung, AJCC 8th Edition - Clinical: Stage IVB (cN1, cM1c) - Signed by Cammie Sickle, MD on 04/09/2020     ALLERGIES:  has No Known Allergies.  MEDICATIONS:  No current facility-administered medications for this encounter.   Current Outpatient Medications  Medication Sig Dispense Refill   acetaminophen (TYLENOL) 500 MG tablet Take 500 mg by mouth every 6 (six) hours as needed.     Ascorbic Acid (VITAMIN C) 1000 MG tablet Take 1,000 mg by mouth daily. (Patient not taking: Reported on 05/22/2022)     aspirin EC 81 MG tablet Take 81 mg by mouth daily. (Patient not taking: Reported on 05/22/2022)     binimetinib (MEKTOVI) 15 MG tablet Take 3 tablets (45 mg total) by mouth 2 (two) times daily. (Patient not taking: Reported on 05/19/2022) 180 tablet 3   Calcium Carbonate-Vit D-Min (CALCIUM 1200 PO) Take 1 tablet by mouth daily. (Patient not taking: Reported on 05/22/2022)     dexamethasone (DECADRON) 2 MG tablet Take 1 tablet (2 mg total) by mouth 2 (two) times daily. 30 tablet 0   encorafenib (BRAFTOVI) 75 MG capsule Take 6 capsules (450 mg total) by mouth daily. (Patient not taking: Reported on 05/19/2022) 180 capsule 3   ergocalciferol (VITAMIN D2) 1.25 MG (50000 UT) capsule Take 1 capsule (50,000 Units total) by mouth once a week. (Patient not taking: Reported on 05/04/2022) 12 capsule 1   folic acid (FOLVITE) Q000111Q MCG tablet Take 800 mcg by mouth daily. (Patient not taking: Reported on 05/22/2022)     Multiple Vitamin (MULTIVITAMIN WITH MINERALS) TABS tablet Take 1 tablet by mouth daily. Senior Multivitamin. (Patient not taking: Reported on 05/22/2022)     Multiple Vitamins-Minerals (EMERGEN-C IMMUNE PO) Take 1 tablet by mouth 2 (two) times daily. (Patient not  taking: Reported on 05/22/2022)     Omega-3 1000 MG CAPS Take 1 tablet by mouth 1 day or 1 dose. (Patient not taking: Reported on 05/22/2022)     omeprazole (PRILOSEC) 40 MG capsule Take by mouth.     saw palmetto 80 MG capsule Take 80 mg by mouth daily. (Patient not taking: Reported on 05/22/2022)      VITAL SIGNS: BP (!) 177/87   Pulse 76   Temp (!) 97.5 F (36.4 C) (Oral)   Resp 18   Ht '5\' 11"'$  (1.803 m)   Wt 199 lb (90.3 kg)   SpO2 96%   BMI 27.75 kg/m  Filed Weights   05/26/22 0858  Weight: 199 lb (90.3 kg)    Estimated body mass index is 27.75 kg/m as calculated from the following:   Height as of this encounter: '5\' 11"'$  (1.803 m).   Weight as of this encounter: 199 lb (90.3 kg).  LABS: CBC:    Component Value Date/Time   WBC 14.8 (H) 05/26/2022 0913   HGB 15.2 05/26/2022 0913   HGB 15.3 05/19/2022 1432   HCT 46.2 05/26/2022 0913   PLT 191 05/26/2022 0913   PLT 176 05/19/2022 1432  MCV 95.9 05/26/2022 0913   NEUTROABS 13.2 (H) 05/26/2022 0913   LYMPHSABS 0.5 (L) 05/26/2022 0913   MONOABS 1.0 05/26/2022 0913   EOSABS 0.0 05/26/2022 0913   BASOSABS 0.0 05/26/2022 0913   Comprehensive Metabolic Panel:    Component Value Date/Time   NA 133 (L) 05/26/2022 0913   K 3.9 05/26/2022 0913   CL 96 (L) 05/26/2022 0913   CO2 30 05/26/2022 0913   BUN 20 05/26/2022 0913   CREATININE 0.93 05/26/2022 0913   CREATININE 0.84 05/19/2022 1432   GLUCOSE 127 (H) 05/26/2022 0913   CALCIUM 9.7 05/26/2022 0913   AST 23 05/26/2022 0913   AST 23 05/19/2022 1432   ALT 18 05/26/2022 0913   ALT 18 05/19/2022 1432   ALKPHOS 44 05/26/2022 0913   BILITOT 1.0 05/26/2022 0913   BILITOT 0.7 05/19/2022 1432   PROT 7.9 05/26/2022 0913   ALBUMIN 4.2 05/26/2022 0913    RADIOGRAPHIC STUDIES: DG Chest 2 View  Result Date: 05/26/2022 CLINICAL DATA:  Weakness, productive cough EXAM: CHEST - 2 VIEW COMPARISON:  CT 05/18/2022 FINDINGS: Unchanged cardiomediastinal silhouette. There is no focal  airspace consolidation. No pleural effusion or evidence of pneumothorax. Thoracic spondylosis. No acute osseous abnormality. There are scattered sclerotic lesions and small pulmonary nodules best seen on recent chest CT, compatible with known metastatic disease. IMPRESSION: No evidence of acute cardiopulmonary disease. Electronically Signed   By: Maurine Simmering M.D.   On: 05/26/2022 10:06   CT Head Wo Contrast  Result Date: 05/26/2022 CLINICAL DATA:  Mental status changes. EXAM: CT HEAD WITHOUT CONTRAST TECHNIQUE: Contiguous axial images were obtained from the base of the skull through the vertex without intravenous contrast. RADIATION DOSE REDUCTION: This exam was performed according to the departmental dose-optimization program which includes automated exposure control, adjustment of the mA and/or kV according to patient size and/or use of iterative reconstruction technique. COMPARISON:  MRI brain 05/20/2022. FINDINGS: Brain: There is no evidence for acute hemorrhage, hydrocephalus, mass lesion, or abnormal extra-axial fluid collection. No definite CT evidence for acute infarction. Diffuse loss of parenchymal volume is consistent with atrophy. Patchy low attenuation in the deep hemispheric and periventricular white matter is nonspecific, but likely reflects chronic microvascular ischemic demyelination. Vascular: No hyperdense vessel or unexpected calcification. Skull: No evidence for fracture. No worrisome lytic or sclerotic lesion. Sinuses/Orbits: The visualized paranasal sinuses and mastoid air cells are clear. Visualized portions of the globes and intraorbital fat are unremarkable. Other: None. IMPRESSION: 1. No acute intracranial abnormality. 2. Atrophy with chronic small vessel ischemic disease. Electronically Signed   By: Misty Stanley M.D.   On: 05/26/2022 09:55   MR Brain W Wo Contrast  Result Date: 05/20/2022 CLINICAL DATA:  Brain metastases, assess treatment response EXAM: MRI HEAD WITHOUT AND WITH  CONTRAST TECHNIQUE: Multiplanar, multiecho pulse sequences of the brain and surrounding structures were obtained without and with intravenous contrast. CONTRAST:  7.80m GADAVIST GADOBUTROL 1 MMOL/ML IV SOLN COMPARISON:  MRI head February 24, 2022. FINDINGS: Brain: Similar appearance of numerous treated metastases and/or chronic microhemorrhages. No progressive edema or significant enhancement to suggest progression. Most conspicuous treated lesion in the left parietal lobe appears similar. No substantial change in confluent T2/FLAIR hyperintensity within the white matter. No evidence of acute infarct, acute hemorrhage, midline shift, or hydrocephalus. Cerebral atrophy. Vascular: Major arterial flow voids are maintained at the skull base. Skull and upper cervical spine: Normal marrow signal. Sinuses/Orbits: No acute findings. IMPRESSION: Stable exam without evidence of recurrent/progressive disease. Electronically Signed  By: Margaretha Sheffield M.D.   On: 05/20/2022 15:49   CT CHEST W CONTRAST  Result Date: 05/19/2022 CLINICAL DATA:  Non-small-cell lung cancer metastatic. Assess treatment response. History of right upper lobectomy. On daily chemotherapy and immunotherapy * Tracking Code: BO * EXAM: CT CHEST WITH CONTRAST TECHNIQUE: Multidetector CT imaging of the chest was performed during intravenous contrast administration. RADIATION DOSE REDUCTION: This exam was performed according to the departmental dose-optimization program which includes automated exposure control, adjustment of the mA and/or kV according to patient size and/or use of iterative reconstruction technique. CONTRAST:  13m OMNIPAQUE IOHEXOL 300 MG/ML  SOLN COMPARISON:  CT 02/18/2022 and older FINDINGS: Cardiovascular: The thoracic aorta has a normal course and caliber with scattered vascular calcifications. Coronary artery calcifications are seen. Calcifications also seen along the aortic valve. The heart is nonenlarged. No pericardial  effusion. Mediastinum/Nodes: Small thyroid gland. No specific abnormal lymph node enlargement seen in the axillary region, hilum or mediastinum. Stable prominent right hilar lymph node measuring 8 x 17 mm on series 2, image 68. Normal caliber thoracic esophagus. Lungs/Pleura: Centrilobular emphysematous lung changes are once again identified. No consolidation, pneumothorax or effusion. Some mild areas of bronchiectasis. Previously there is an ill-defined 8 x 6 mm nodule in the middle lobe. This same lesion is seen today but appears less confluence and slightly smaller measuring 7 by 3 mm. Series 3, image 81. Small nodule just caudal to this which measured 4 mm on the prior stable today on series 3, image 90. Smaller areas of nodularity in the posterior right upper lobe on image 61 and 63 are also stable. Upper Abdomen: Left adrenal gland continues to be thickened and nodular, unchanged from prior. Stone in the gallbladder. Hepatic cysts. Musculoskeletal: Scattered areas of bony sclerotic changes are once again identified along the spine, right clavicle, sternum and ribs in a similar distribution compared to prior CT consistent with osseous metastatic disease. Separate scattered degenerative changes. IMPRESSION: 1. Multiple smaller nodules are again identified, similar to previous. The dominant nodule in the middle lobe on the prior, today is slightly smaller 2. Scattered bony sclerotic changes are once again identified consistent with osseous metastatic disease. 3. Cholelithiasis. 4. Stable adrenal nodularity on the left. Aortic Atherosclerosis (ICD10-I70.0) and Emphysema (ICD10-J43.9). Electronically Signed   By: AJill SideM.D.   On: 05/19/2022 12:39    PERFORMANCE STATUS (ECOG) : 2 - Symptomatic, <50% confined to bed  Review of Systems Unless otherwise noted, a complete review of systems is negative.  Physical Exam General: NAD Cardiovascular: regular rate and rhythm Pulmonary: clear ant  fields Abdomen: soft, nontender, + bowel sounds GU: no suprapubic tenderness Extremities: no edema, no joint deformities Skin: no rashes Neurological: Weakness but otherwise nonfocal  IMPRESSION: Patient seen in the ED.  I spoke with patient and then met privately with wife and daughter.  Patient's other daughter participated via video chat.  Patient/family recognize that he is doing poorly with overall decline in performance status.  Wife is having increasing difficultly with managing his care at home.  We discussed overall goals.  Cancer treatments are currently on hold due to overall decline.  Additionally, his declining performance status likely excludes further cancer treatment.  We discussed option of hospice involvement and patient/family were in agreement.  Will consult the hospice liaison to coordinate.  Discussed CODE STATUS.  Family brought in patient's living will and requested CODE STATUS to be changed to reflect his decision for DNR/DNI.  PLAN: -Best supportive care -Referral  to hospice -DNR/DNI  Case and plan discussed with Dr. Rogue Bussing   Time Total: 60 minutes  Visit consisted of counseling and education dealing with the complex and emotionally intense issues of symptom management and palliative care in the setting of serious and potentially life-threatening illness.Greater than 50%  of this time was spent counseling and coordinating care related to the above assessment and plan.  Signed by: Altha Harm, PhD, NP-C

## 2022-05-26 NOTE — Telephone Encounter (Signed)
Patient wife Collie Siad called reporting that patient had to be taken to ER this morning where he currently is at and so his appts for lab and SW needs to be cancelled today. She states she  feels that the next step for patient is to go with hospice and she prefers [facility]. I advised that she speak with the ER physician and or nurse regarding this request and told her that I would send message to our providers as well.

## 2022-05-26 NOTE — ED Provider Notes (Signed)
[facility] Provider Note    Event Date/Time   First MD Initiated Contact with Patient 05/26/22 [phone]     (approximate)   History   Chief Complaint Weakness   HPI  Curtis Andrade. is a 77 y.o. male with past medical history of lung cancer metastatic to bone and brain, focal seizure, cognitive impairment, and laryngeal cancer who presents to the ED complaining of weakness.  Per EMS, patient has had a gradual decline over about the past month both cognitively and with his general strength.  More recently, he has been feeling nauseous with a couple episodes of vomiting over the past 2 days.  Family also reported to EMS that it had been 3 days since he had a bowel movement.  Patient states that he does not currently feel nauseous and he denies any abdominal pain.  He denies any fevers, cough, chest pain, or shortness of breath.     Physical Exam   Triage Vital Signs: ED Triage Vitals  Enc Vitals Group     BP 05/26/22 0851 (!) 156/80     Pulse Rate 05/26/22 0851 73     Resp 05/26/22 0851 14     Temp 05/26/22 0851 (!) 97.5 F (36.4 C)     Temp Source 05/26/22 0851 Oral     SpO2 05/26/22 0851 96 %     Weight 05/26/22 0858 199 lb (90.3 kg)     Height 05/26/22 0858 '5\' 11"'$  (1.803 m)     Head Circumference --      Peak Flow --      Pain Score 05/26/22 0858 0     Pain Loc --      Pain Edu? --      Excl. in [place]? --     Most recent vital signs: Vitals:   05/26/22 1115 05/26/22 1219  BP:    Pulse: 76   Resp: 18   Temp:    SpO2:  97%    Constitutional: Alert and oriented to person and place, but not time or situation. Eyes: Conjunctivae are normal.  Pupils equal, round, and reactive to light bilaterally. Head: Atraumatic. Nose: No congestion/rhinnorhea. Mouth/Throat: Mucous membranes are dry Cardiovascular: Normal rate, regular rhythm. Grossly normal heart sounds.  2+ radial pulses bilaterally. Respiratory: Normal respiratory effort.  No  retractions. Lungs CTAB. Gastrointestinal: Soft and nontender. No distention. Musculoskeletal: No lower extremity tenderness nor edema.  Neurologic:  Normal speech and language. No gross focal neurologic deficits are appreciated.    ED Results / Procedures / Treatments   Labs (all labs ordered are listed, but only abnormal results are displayed) Labs Reviewed  CBC WITH DIFFERENTIAL/PLATELET - Abnormal; Notable for the following components:      Result Value   WBC 14.8 (*)    Neutro Abs 13.2 (*)    Lymphs Abs 0.5 (*)    All other components within normal limits  COMPREHENSIVE METABOLIC PANEL - Abnormal; Notable for the following components:   Sodium 133 (*)    Chloride 96 (*)    Glucose, Bld 127 (*)    All other components within normal limits  URINALYSIS, ROUTINE W REFLEX MICROSCOPIC - Abnormal; Notable for the following components:   Color, Urine YELLOW (*)    APPearance CLOUDY (*)    Hgb urine dipstick SMALL (*)    Bacteria, UA MANY (*)    All other components within normal limits  MAGNESIUM  TROPONIN I (HIGH SENSITIVITY)  EKG  ED ECG REPORT I, [name], the attending physician, personally viewed and interpreted this ECG.   Date: 05/26/2022  EKG Time: 8:57  Rate: 75  Rhythm: normal sinus rhythm, isolated PVC  Axis: Normal  Intervals:none  ST&T Change: None  RADIOLOGY Chest x-ray reviewed and interpreted by me with no infiltrate, edema, or effusion.  CT head reviewed and interpreted by me with no hemorrhage or midline shift.  PROCEDURES:  Critical Care performed: No  Procedures   MEDICATIONS ORDERED IN ED: Medications  lactated ringers bolus 1,000 mL (0 mLs Intravenous Stopped 05/26/22 1120)     IMPRESSION / MDM / ASSESSMENT AND PLAN / ED COURSE  I reviewed the triage vital signs and the nursing notes.                              77 y.o. male with past medical history of lung cancer metastatic to bone and brain, focal seizures, cognitive  impairment, and laryngeal cancer who presents to the ED for increasing weakness and confusion over the past couple of months, now with nausea and vomiting with constipation over the past 2 days.  Patient's presentation is most consistent with acute presentation with potential threat to life or bodily function.  Differential diagnosis includes, but is not limited to, intracranial hemorrhage, cerebral edema, pneumonia, UTI, electrolyte abnormality, AKI, anemia, bowel obstruction, gastroenteritis, failure to thrive.  Patient chronically ill-appearing but in no acute distress, vital signs are unremarkable.  He is only oriented to person and place, but does not appear to have any focal neurologic deficits, remains awake and alert.  EKG shows no evidence of arrhythmia or ischemia, will check CT head, chest x-ray, urinalysis, and labs.  He does appear dehydrated and we will give IV fluids, abdominal exam is benign and I doubt bowel obstruction at this time.  CT head and chest x-ray are unremarkable.  Labs are reassuring with no significant anemia, leukocytosis, lecture abnormality, or AKI.  Urinalysis shows no signs of infection.  Unfortunately, patient's worsening functional and cognitive status is likely due to his metastatic cancer.  Family is interested in pursuing palliative care and the palliative team evaluated patient here in the ED.  They will assist in initial arrangements for hospice care, request that patient be admitted for observation so that additional resources may be arranged.  Case discussed with hospitalist for admission.      FINAL CLINICAL IMPRESSION(S) / ED DIAGNOSES   Final diagnoses:  Generalized weakness  Primary malignant neoplasm of lung metastatic to other site, unspecified laterality [facility])  Palliative care encounter     Rx / DC Orders   ED Discharge Orders     None        Note:  This document was prepared using Dragon voice recognition software and may include  unintentional dictation errors.   [name], MD 05/26/22 [phone]

## 2022-05-26 NOTE — Telephone Encounter (Signed)
Verbal order called and left on Lisa's secure voice mail

## 2022-05-26 NOTE — Assessment & Plan Note (Signed)
Patient had multiple episodes of vomiting overnight and a brown/black emesis episode in the ED. likely secondary to Mallory-Weiss versus gastritis.  Given transition to comfort measures, no indication for GI consultation or EGD at this time.  Will manage with Protonix daily to provide relief of any gastric irritation.  - Protonix 40 mg p.o. twice daily

## 2022-05-26 NOTE — ED Triage Notes (Signed)
Pt bib ems c/o cognitive decline x 3 weeks, weakness, vomiting/constipation H/O brain cancer with mets. Alert oriented to self. Niece reported SBP 70s at home.  144/81 HR 75 94% RA

## 2022-05-26 NOTE — ED Notes (Addendum)
Palliative at bedside. Patient and daughter requested applesauce. Pt eating applesauce without any difficulty currently. Warm blanket also given to patient.

## 2022-05-26 NOTE — Assessment & Plan Note (Addendum)
Patient has been gradually declining over the last several weeks to months with increasing generalized weakness, orthostatic hypotension, and declining cognitive function.  Curtis Andrade and his family have made the decision to move into hospice care with the shift to providing comfort and quality of life rather than trying to treat his medical conditions.  At this time, in-hospital death is not expected with plan to transition to home hospice  - Palliative care following; appreciate their recommendations and assistance - Plan for home hospice at this time - Continue home dexamethasone - Morphine as needed for pain control or dyspnea - Zofran as needed for nausea - Dulcolax suppositories as needed for constipation

## 2022-05-26 NOTE — Consult Note (Signed)
[place] NOTE  Patient Care Team: [name], MD as PCP - General (Family Medicine) [name], MD as Consulting Physician (Family Medicine) [name], [name], MD (General Surgery) [place] Park, DO as Consulting Physician (Radiation Oncology) Cammie Sickle, MD as Consulting Physician (Oncology)  CHIEF COMPLAINTS/PURPOSE OF CONSULTATION: Lung cancer  HISTORY OF PRESENTING ILLNESS: Patient in the stretcher in the emergency room.  Accompanied by his daughter and wife.  Curtis Andrade 77 y.o.  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  Patient had been on B-raf inhibitors-good response.  Most recent MRI brain negative for any acute process.  A also recent body imaging negative for any progressive disease.  Patient was recently seen in the clinic with episodes of orthostatic hypotension.  Patient started on dexamethasone.  However given the continued falls patient was brought to the emergency room.  Family denies any loss of consciousness.  Review of Systems  Constitutional:  Positive for malaise/fatigue and weight loss. Negative for chills, diaphoresis and fever.  HENT:  Negative for nosebleeds and sore throat.   Eyes:  Negative for double vision.  Respiratory:  Negative for cough, hemoptysis, sputum production, shortness of breath and wheezing.   Cardiovascular:  Negative for chest pain, palpitations, orthopnea and leg swelling.  Gastrointestinal:  Positive for nausea and vomiting. Negative for abdominal pain, blood in stool, constipation, diarrhea, heartburn and melena.  Genitourinary:  Negative for dysuria, frequency and urgency.  Musculoskeletal:  Positive for joint pain. Negative for back pain.  Skin: Negative.  Negative for itching and rash.  Neurological:  Positive for  dizziness and weakness. Negative for tingling, focal weakness and headaches.  Endo/Heme/Allergies:  Does not bruise/bleed easily.  Psychiatric/Behavioral:  Positive for memory loss. Negative for depression. The patient is not nervous/anxious and does not have insomnia.     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 Baruch Gouty and Dr. Nadeen Landau   Phlebitis 1998   Primary cancer of right upper lobe of lung ([place]) 01/14/2017   Dr. Genevive Bi performed a wedge resection of RUL.    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/27/2016    SURGICAL HISTORY: Past Surgical History:  Procedure Laterality Date   COLONOSCOPY  2013   Dr Vira Agar   COLONOSCOPY WITH PROPOFOL N/A 12/23/2016   Procedure: COLONOSCOPY WITH PROPOFOL;  Surgeon: Robert Bellow, MD;  Location: [facility] ENDOSCOPY;  Service: Endoscopy;  Laterality: N/A;   EYE SURGERY     HERNIA REPAIR Right 1990's   TENDON REPAIR Left 03/14/2019   Procedure: TENDON REPAIR AND GRAFT  TRANSFER EXTENSOR INDICIS PROPRIUS LEFT THUMB;  Surgeon: [name], MD;  Location: [place];  Service: Orthopedics;  Laterality: Left;  AXILLARY BLOCK   THORACOTOMY Right 01/14/2017   Procedure: RIGHT THORACOSCOPYWITH WIDE WEDGE RESECTION,  PREOP BROCHOSCOPY;  Surgeon: [name], MD;  Location: [facility] OR[facility];  Service: General;  Laterality: Right;   THROAT SURGERY  2010    SOCIAL HISTORY: Social History   Socioeconomic History   Marital status: Married    Spouse name: Not on  file   Number of children: Not on file   Years of education: Not on file   Highest education level: Not on file  Occupational History   Not on file  Tobacco Use   Smoking status: Former    Years: 50.00    Types: Cigarettes    Quit  date: 03/31/2007    Years since quitting: 15.1   Smokeless tobacco: Never  Vaping Use   Vaping Use: Never used  Substance and Sexual Activity   Alcohol use: No   Drug use: No   Sexual activity: Yes    Birth control/protection: None    Comment: Married  Other Topics Concern   Not on file  Social History Narrative   Not on file   Social Determinants of Health   Financial Resource Strain: Low Risk  (03/27/2022)   Overall Financial Resource Strain (CARDIA)    Difficulty of Paying Living Expenses: Not very hard  Food Insecurity: No Food Insecurity (03/27/2022)   Hunger Vital Sign    Worried About Running Out of Food in the Last Year: Never true    [place] in the Last Year: Never true  Transportation Needs: No Transportation Needs (03/27/2022)   PRAPARE - Hydrologist (Medical): No    Lack of Transportation (Non-Medical): No  Physical Activity: Insufficiently Active (03/27/2022)   Exercise Vital Sign    Days of Exercise per Week: 2 days    Minutes of Exercise per Session: 30 min  Stress: Stress Concern Present (03/27/2022)   [place]    Feeling of Stress : Rather much  Social Connections: Socially Integrated (03/27/2022)   Social Connection and Isolation Panel [NHANES]    Frequency of Communication with Friends and Family: More than three times a week    Frequency of Social Gatherings with Friends and Family: More than three times a week    Attends Religious Services: 1 to 4 times per year    Active Member of [employer] or Organizations: Yes    Attends Archivist Meetings: Never    Marital Status: Married  Human resources officer Violence: Not At Risk (03/27/2022)   Humiliation, Afraid, Rape, and Kick questionnaire    Fear of Current or Ex-Partner: No    Emotionally Abused: No    Physically Abused: No    Sexually Abused: No    FAMILY HISTORY: Family History  Problem  Relation Age of Onset   Alzheimer's disease Mother    Alzheimer's disease Father    Stroke Father    Colon cancer Neg Hx     ALLERGIES:  has No Known Allergies.  MEDICATIONS:  Current Facility-Administered Medications  Medication Dose Route Frequency Provider Last Rate Last Admin   acetaminophen (TYLENOL) tablet 650 mg  650 mg Oral Q6H PRN Jose Persia, MD       Or   acetaminophen (TYLENOL) suppository 650 mg  650 mg Rectal Q6H PRN Jose Persia, MD       bisacodyl (DULCOLAX) suppository 10 mg  10 mg Rectal Daily PRN Jose Persia, MD       dexamethasone (DECADRON) tablet 2 mg  2 mg Oral BID Jose Persia, MD       LORazepam (ATIVAN) tablet 1 mg  1 mg Oral Q4H PRN Jose Persia, MD       Or   LORazepam (ATIVAN) 2 MG/ML concentrated solution 1 mg  1 mg Sublingual Q4H PRN Jose Persia, MD  morphine CONCENTRATE 10 MG/0.5ML oral solution 5 mg  5 mg Oral Q2H PRN Jose Persia, MD       Or   morphine CONCENTRATE 10 MG/0.5ML oral solution 5 mg  5 mg Sublingual Q2H PRN Jose Persia, MD       ondansetron (ZOFRAN) tablet 4 mg  4 mg Oral Q6H PRN Jose Persia, MD       Or   ondansetron (ZOFRAN) injection 4 mg  4 mg Intravenous Q6H PRN Jose Persia, MD       pantoprazole (PROTONIX) EC tablet 40 mg  40 mg Oral BID AC Jose Persia, MD       Current Outpatient Medications  Medication Sig Dispense Refill   acetaminophen (TYLENOL) 500 MG tablet Take 500 mg by mouth every 6 (six) hours as needed.     Ascorbic Acid (VITAMIN C) 1000 MG tablet Take 1,000 mg by mouth daily. (Patient not taking: Reported on 05/22/2022)     aspirin EC 81 MG tablet Take 81 mg by mouth daily. (Patient not taking: Reported on 05/22/2022)     binimetinib (MEKTOVI) 15 MG tablet Take 3 tablets (45 mg total) by mouth 2 (two) times daily. (Patient not taking: Reported on 05/19/2022) 180 tablet 3   Calcium Carbonate-Vit D-Min (CALCIUM 1200 PO) Take 1 tablet by mouth daily. (Patient not taking: Reported  on 05/22/2022)     dexamethasone (DECADRON) 2 MG tablet Take 1 tablet (2 mg total) by mouth 2 (two) times daily. 30 tablet 0   encorafenib (BRAFTOVI) 75 MG capsule Take 6 capsules (450 mg total) by mouth daily. (Patient not taking: Reported on 05/19/2022) 180 capsule 3   ergocalciferol (VITAMIN D2) 1.25 MG (50000 UT) capsule Take 1 capsule (50,000 Units total) by mouth once a week. (Patient not taking: Reported on 05/04/2022) 12 capsule 1   folic acid (FOLVITE) Q000111Q MCG tablet Take 800 mcg by mouth daily. (Patient not taking: Reported on 05/22/2022)     Multiple Vitamin (MULTIVITAMIN WITH MINERALS) TABS tablet Take 1 tablet by mouth daily. Senior Multivitamin. (Patient not taking: Reported on 05/22/2022)     Multiple Vitamins-Minerals (EMERGEN-C IMMUNE PO) Take 1 tablet by mouth 2 (two) times daily. (Patient not taking: Reported on 05/22/2022)     Omega-3 1000 MG CAPS Take 1 tablet by mouth 1 day or 1 dose. (Patient not taking: Reported on 05/22/2022)     omeprazole (PRILOSEC) 40 MG capsule Take by mouth.     saw palmetto 80 MG capsule Take 80 mg by mouth daily. (Patient not taking: Reported on 05/22/2022)      PHYSICAL EXAMINATION:   Vitals:   05/26/22 1200 05/26/22 1219  BP: (!) 173/82   Pulse: 75   Resp: 13   Temp: 97.9 F (36.6 C)   SpO2: 100% 97%   Filed Weights   05/26/22 0858  Weight: 199 lb (90.3 kg)    Physical Exam Vitals and nursing note reviewed.  HENT:     Head: Normocephalic and atraumatic.     Mouth/Throat:     Pharynx: Oropharynx is clear.  Eyes:     Extraocular Movements: Extraocular movements intact.     Pupils: Pupils are equal, round, and reactive to light.  Cardiovascular:     Rate and Rhythm: Normal rate and regular rhythm.  Pulmonary:     Comments: Decreased breath sounds bilaterally.  Abdominal:     Palpations: Abdomen is soft.  Musculoskeletal:        General: Normal range of motion.  Cervical back: Normal range of motion.  Skin:    General: Skin is  warm.  Neurological:     General: No focal deficit present.     Mental Status: He is alert and oriented to person, place, and time.  Psychiatric:        Behavior: Behavior normal.        Judgment: Judgment normal.     LABORATORY DATA:  I have reviewed the data as listed Lab Results  Component Value Date   WBC 14.8 (H) 05/26/2022   HGB 15.2 05/26/2022   HCT 46.2 05/26/2022   MCV 95.9 05/26/2022   PLT 191 05/26/2022   Recent Labs    05/04/22 1002 05/19/22 1432 05/26/22 0913  NA 135 137 133*  K 4.0 3.9 3.9  CL 97* 100 96*  CO2 '27 26 30  '$ GLUCOSE 131* 112* 127*  BUN '15 16 20  '$ CREATININE 1.07 0.84 0.93  CALCIUM 9.2 9.1 9.7  GFRNONAA >60 >60 >60  PROT 8.0 7.9 7.9  ALBUMIN 4.2 4.2 4.2  AST '26 23 23  '$ ALT '18 18 18  '$ ALKPHOS 36* 41 44  BILITOT 0.4 0.7 1.0    RADIOGRAPHIC STUDIES: I have personally reviewed the radiological images as listed and agreed with the findings in the report. DG Chest 2 View  Result Date: 05/26/2022 CLINICAL DATA:  Weakness, productive cough EXAM: CHEST - 2 VIEW COMPARISON:  CT 05/18/2022 FINDINGS: Unchanged cardiomediastinal silhouette. There is no focal airspace consolidation. No pleural effusion or evidence of pneumothorax. Thoracic spondylosis. No acute osseous abnormality. There are scattered sclerotic lesions and small pulmonary nodules best seen on recent chest CT, compatible with known metastatic disease. IMPRESSION: No evidence of acute cardiopulmonary disease. Electronically Signed   By: Maurine Simmering M.D.   On: 05/26/2022 10:06   CT Head Wo Contrast  Result Date: 05/26/2022 CLINICAL DATA:  Mental status changes. EXAM: CT HEAD WITHOUT CONTRAST TECHNIQUE: Contiguous axial images were obtained from the base of the skull through the vertex without intravenous contrast. RADIATION DOSE REDUCTION: This exam was performed according to the departmental dose-optimization program which includes automated exposure control, adjustment of the mA and/or kV  according to patient size and/or use of iterative reconstruction technique. COMPARISON:  MRI brain 05/20/2022. FINDINGS: Brain: There is no evidence for acute hemorrhage, hydrocephalus, mass lesion, or abnormal extra-axial fluid collection. No definite CT evidence for acute infarction. Diffuse loss of parenchymal volume is consistent with atrophy. Patchy low attenuation in the deep hemispheric and periventricular white matter is nonspecific, but likely reflects chronic microvascular ischemic demyelination. Vascular: No hyperdense vessel or unexpected calcification. Skull: No evidence for fracture. No worrisome lytic or sclerotic lesion. Sinuses/Orbits: The visualized paranasal sinuses and mastoid air cells are clear. Visualized portions of the globes and intraorbital fat are unremarkable. Other: None. IMPRESSION: 1. No acute intracranial abnormality. 2. Atrophy with chronic small vessel ischemic disease. Electronically Signed   By: Misty Stanley M.D.   On: 05/26/2022 09:55   MR Brain W Wo Contrast  Result Date: 05/20/2022 CLINICAL DATA:  Brain metastases, assess treatment response EXAM: MRI HEAD WITHOUT AND WITH CONTRAST TECHNIQUE: Multiplanar, multiecho pulse sequences of the brain and surrounding structures were obtained without and with intravenous contrast. CONTRAST:  7.32m GADAVIST GADOBUTROL 1 MMOL/ML IV SOLN COMPARISON:  MRI head February 24, 2022. FINDINGS: Brain: Similar appearance of numerous treated metastases and/or chronic microhemorrhages. No progressive edema or significant enhancement to suggest progression. Most conspicuous treated lesion in the left parietal lobe appears similar.  No substantial change in confluent T2/FLAIR hyperintensity within the white matter. No evidence of acute infarct, acute hemorrhage, midline shift, or hydrocephalus. Cerebral atrophy. Vascular: Major arterial flow voids are maintained at the skull base. Skull and upper cervical spine: Normal marrow signal.  Sinuses/Orbits: No acute findings. IMPRESSION: Stable exam without evidence of recurrent/progressive disease. Electronically Signed   By: Margaretha Sheffield M.D.   On: 05/20/2022 15:49   CT CHEST W CONTRAST  Result Date: 05/19/2022 CLINICAL DATA:  Non-small-cell lung cancer metastatic. Assess treatment response. History of right upper lobectomy. On daily chemotherapy and immunotherapy * Tracking Code: BO * EXAM: CT CHEST WITH CONTRAST TECHNIQUE: Multidetector CT imaging of the chest was performed during intravenous contrast administration. RADIATION DOSE REDUCTION: This exam was performed according to the departmental dose-optimization program which includes automated exposure control, adjustment of the mA and/or kV according to patient size and/or use of iterative reconstruction technique. CONTRAST:  19m OMNIPAQUE IOHEXOL 300 MG/ML  SOLN COMPARISON:  CT 02/18/2022 and older FINDINGS: Cardiovascular: The thoracic aorta has a normal course and caliber with scattered vascular calcifications. Coronary artery calcifications are seen. Calcifications also seen along the aortic valve. The heart is nonenlarged. No pericardial effusion. Mediastinum/Nodes: Small thyroid gland. No specific abnormal lymph node enlargement seen in the axillary region, hilum or mediastinum. Stable prominent right hilar lymph node measuring 8 x 17 mm on series 2, image 68. Normal caliber thoracic esophagus. Lungs/Pleura: Centrilobular emphysematous lung changes are once again identified. No consolidation, pneumothorax or effusion. Some mild areas of bronchiectasis. Previously there is an ill-defined 8 x 6 mm nodule in the middle lobe. This same lesion is seen today but appears less confluence and slightly smaller measuring 7 by 3 mm. Series 3, image 81. Small nodule just caudal to this which measured 4 mm on the prior stable today on series 3, image 90. Smaller areas of nodularity in the posterior right upper lobe on image 61 and 63 are also  stable. Upper Abdomen: Left adrenal gland continues to be thickened and nodular, unchanged from prior. Stone in the gallbladder. Hepatic cysts. Musculoskeletal: Scattered areas of bony sclerotic changes are once again identified along the spine, right clavicle, sternum and ribs in a similar distribution compared to prior CT consistent with osseous metastatic disease. Separate scattered degenerative changes. IMPRESSION: 1. Multiple smaller nodules are again identified, similar to previous. The dominant nodule in the middle lobe on the prior, today is slightly smaller 2. Scattered bony sclerotic changes are once again identified consistent with osseous metastatic disease. 3. Cholelithiasis. 4. Stable adrenal nodularity on the left. Aortic Atherosclerosis (ICD10-I70.0) and Emphysema (ICD10-J43.9). Electronically Signed   By: AJill SideM.D.   On: 05/19/2022 12:33    # 77year old male patient with stage IV lung cancer adenocarcinoma -with metastasis to brain and bone is currently admitted to hospital for recurrent falls/orthostatic hypotension.  # Metastatic adenocarcinoma of the lung to bone/brain.  Recent imaging MRI brain negative for any acute progression of disease.  Also CT of the chest negative for any acute progression.  However given declining performance status B-RAF inhibitors are on hold/discontinued-see below.  # Multiple falls-likely orthostatic hypotension.  Unclear etiology.  # Cognitive decline/declining performance status-likely secondary to history of whole brain radiation.  # DNR/DNI  RECOMMENDATIONS/PLAN:  # Recommend checking cortisol level; thyroid profile for possible cause of patient's orthostatic hypotension.  # However given the significant decline in performance status/cognitive decline I think hospice is reasonable.  This was discussed with the patient  and family including wife and daughter.  They are in agreement.  S/p evaluation with palliative care; discussed with  Josh Borders.  Thank you for allowing me to participate in the care of your pleasant patient. Please do not hesitate to contact me with questions or concerns in the interim.  Discussed with Dr.Basaraba.   Above plan of care was discussed with patient/family in detail.  My contact information was given to the patient/family.     Cammie Sickle, MD 05/26/2022 1:16 PM

## 2022-05-26 NOTE — Progress Notes (Signed)
[facility] ED 12 [facility]) Hospital Liaison RN note  Received request from PMT/TOC for hospice services at home after discharge. Chart and patient information under review by Hospice physician.   Spoke with patient and daughter at bedside to initiate education related to hospice philosophy, services, and team approach to care. Patient/family verbalized understanding of information given.   Per discussion, the plan is for discharge home by EMS on 2.28 after DME delivery.   DME needs discussed. Patient has the following equipment in the home. Rollator, wheelchair, BSC, Shower chair, & hoyer lift. Patient/family requests the following equipment for deliver: Hospital bed and overbed table.  Address has been verified and is correct in the chart. Collie Siad at [phone] is the family contact to arrange time of equipment delivery.   Please send signed and completed DNR with patient/family. Please provide prescriptions at discharge as needed for ongoing symptom management.   [facility] information and contact numbers given to patient's daughter. Above information shared with Caryl Pina. Please call with any hospice related questions or concerns.   Thank you for the opportunity to participate in this patient's care.  Jhonnie Garner, Therapist, sports, BSN [employer] [phone]

## 2022-05-26 NOTE — Telephone Encounter (Signed)
Verbal order called and left on secure voice mail for continuation of PT

## 2022-05-26 NOTE — ED Notes (Addendum)
Pt A&Ox4. Pt has wife and daughter at bedside. Cancer MD at bedside as well. Family said pt vomited into emesis basin and was a dark brown/black color. Vitals currently stable.

## 2022-05-27 ENCOUNTER — Telehealth: Payer: Self-pay | Admitting: Internal Medicine

## 2022-05-27 DIAGNOSIS — C771 Secondary and unspecified malignant neoplasm of intrathoracic lymph nodes: Secondary | ICD-10-CM | POA: Diagnosis present

## 2022-05-27 DIAGNOSIS — K5900 Constipation, unspecified: Secondary | ICD-10-CM | POA: Diagnosis present

## 2022-05-27 DIAGNOSIS — Z820 Family history of epilepsy and other diseases of the nervous system: Secondary | ICD-10-CM | POA: Diagnosis not present

## 2022-05-27 DIAGNOSIS — Z823 Family history of stroke: Secondary | ICD-10-CM | POA: Diagnosis not present

## 2022-05-27 DIAGNOSIS — Z923 Personal history of irradiation: Secondary | ICD-10-CM | POA: Diagnosis not present

## 2022-05-27 DIAGNOSIS — C7889 Secondary malignant neoplasm of other digestive organs: Secondary | ICD-10-CM | POA: Diagnosis present

## 2022-05-27 DIAGNOSIS — I951 Orthostatic hypotension: Secondary | ICD-10-CM | POA: Diagnosis present

## 2022-05-27 DIAGNOSIS — Z7982 Long term (current) use of aspirin: Secondary | ICD-10-CM | POA: Diagnosis not present

## 2022-05-27 DIAGNOSIS — C7951 Secondary malignant neoplasm of bone: Secondary | ICD-10-CM | POA: Diagnosis present

## 2022-05-27 DIAGNOSIS — Z79899 Other long term (current) drug therapy: Secondary | ICD-10-CM | POA: Diagnosis not present

## 2022-05-27 DIAGNOSIS — Z85118 Personal history of other malignant neoplasm of bronchus and lung: Secondary | ICD-10-CM | POA: Diagnosis not present

## 2022-05-27 DIAGNOSIS — Z8521 Personal history of malignant neoplasm of larynx: Secondary | ICD-10-CM | POA: Diagnosis not present

## 2022-05-27 DIAGNOSIS — K3189 Other diseases of stomach and duodenum: Secondary | ICD-10-CM | POA: Diagnosis present

## 2022-05-27 DIAGNOSIS — Z66 Do not resuscitate: Secondary | ICD-10-CM | POA: Diagnosis present

## 2022-05-27 DIAGNOSIS — C787 Secondary malignant neoplasm of liver and intrahepatic bile duct: Secondary | ICD-10-CM | POA: Diagnosis present

## 2022-05-27 DIAGNOSIS — C7931 Secondary malignant neoplasm of brain: Secondary | ICD-10-CM | POA: Diagnosis present

## 2022-05-27 DIAGNOSIS — Z87891 Personal history of nicotine dependence: Secondary | ICD-10-CM | POA: Diagnosis not present

## 2022-05-27 DIAGNOSIS — Z515 Encounter for palliative care: Secondary | ICD-10-CM | POA: Diagnosis not present

## 2022-05-27 DIAGNOSIS — I10 Essential (primary) hypertension: Secondary | ICD-10-CM | POA: Diagnosis present

## 2022-05-27 DIAGNOSIS — C7972 Secondary malignant neoplasm of left adrenal gland: Secondary | ICD-10-CM | POA: Diagnosis present

## 2022-05-27 DIAGNOSIS — R4189 Other symptoms and signs involving cognitive functions and awareness: Secondary | ICD-10-CM | POA: Diagnosis present

## 2022-05-27 DIAGNOSIS — K920 Hematemesis: Secondary | ICD-10-CM | POA: Diagnosis present

## 2022-05-27 DIAGNOSIS — Z902 Acquired absence of lung [part of]: Secondary | ICD-10-CM | POA: Diagnosis not present

## 2022-05-27 DIAGNOSIS — R531 Weakness: Secondary | ICD-10-CM | POA: Diagnosis present

## 2022-05-27 DIAGNOSIS — L409 Psoriasis, unspecified: Secondary | ICD-10-CM | POA: Diagnosis present

## 2022-05-27 LAB — CORTISOL-AM, BLOOD: Cortisol - AM: 18.4 ug/dL (ref 6.7–22.6)

## 2022-05-27 MED ORDER — OXYCODONE HCL 5 MG PO TABS: 5.0000 mg | ORAL_TABLET | ORAL | 0 refills | Status: DC | PRN

## 2022-05-27 MED ORDER — MORPHINE SULFATE (CONCENTRATE) 10 MG/0.5ML PO SOLN: 5.0000 mg | ORAL | 0 refills | Status: DC | PRN

## 2022-05-27 MED ORDER — BACLOFEN 10 MG PO TABS: 10.0000 mg | ORAL_TABLET | Freq: Three times a day (TID) | ORAL | 5 refills | Status: DC

## 2022-05-27 MED ORDER — BACLOFEN 10 MG PO TABS
10.0000 mg | ORAL_TABLET | Freq: Three times a day (TID) | ORAL | Status: DC
  Administered 2022-05-27: 10 mg via ORAL
  Filled 2022-05-27: qty 1

## 2022-05-27 NOTE — Discharge Summary (Signed)
[place] [id number] DOB: 08/13/1945 DOA: 05/26/2022  PCP: [name], MD  Admit date: 05/26/2022 Discharge date: 05/27/2022  Time spent: 35 minutes     Discharge Diagnoses:  Principal Problem:   End of life care Active Problems:   Primary cancer of right upper lobe of lung ([place])   Hematemesis   Palliative care encounter   Discharge Condition: stable  Diet recommendation: ad lib  Filed Weights   05/26/22 0858  Weight: 90.3 kg    History of present illness:  From admission h and p Curtis Andrade. is a 77 y.o. 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   History of pain from patient and his family at bedside.  Curtis Andrade states at this time, he is feeling comfortable.  He did have some vomiting since arriving to the ED but is not currently nauseous.  He denies any pain at this time.   Patient's wife, Curtis Andrade, states that Curtis Andrade has been gradually declining for several weeks now.  He has become weaker and has suffered from low blood pressure.  Last night, he began to have multiple episodes of vomiting and became significantly weak.  He slid off the bed and she was unable to pick him up off the ground.  Due to this, EMS was called.  In addition, patient has been constipated for the last few weeks, and has not had a bowel movement in 3 days.    Hospital Course:  Palliative care was consulted and family decided to transition to full comfort care. Baclofen started for hiccups which are main symptom that needs management. Hospice liaison met with family, DME provided for home use, hospice will follow at home.   Procedures: none   Consultations: oncology  Discharge Exam: Vitals:   05/26/22 1630 05/27/22 0426  BP: (!) 144/83 (!) 188/94  Pulse: 75 71  Resp: 17 18  Temp:   (!) 97.4 F (36.3 C)  SpO2: 97% 95%    General: chronically ill appearing, constant hiccups Cardiovascular: ext wearm Respiratory: normal WOB  Discharge Instructions   Discharge Instructions     Diet general   Complete by: As directed    Increase activity slowly   Complete by: As directed       Allergies as of 05/27/2022   No Known Allergies      Medication List     STOP taking these medications    acetaminophen 500 MG tablet Commonly known as: TYLENOL   aspirin EC 81 MG tablet   binimetinib 15 MG tablet Commonly known as: MEKTOVI   CALCIUM 1200 PO   dexamethasone 2 MG tablet Commonly known as: DECADRON   EMERGEN-C IMMUNE PO   encorafenib 75 MG capsule Commonly known as: BRAFTOVI   ergocalciferol 1.25 MG (50000 UT) capsule Commonly known as: VITAMIN D2   folic acid Q000111Q MCG tablet Commonly known as: FOLVITE   multivitamin with minerals Tabs tablet   Omega-3 1000 MG Caps   omeprazole 40 MG capsule Commonly known as: PRILOSEC   saw palmetto 80 MG capsule   vitamin C 1000 MG tablet       TAKE these medications    baclofen 10 MG tablet Commonly known as: LIORESAL Take 1 tablet (10 mg total) by mouth 3 (three) times daily.   morphine CONCENTRATE 10 MG/0.5ML Soln concentrated solution  Take 0.25 mLs (5 mg total) by mouth every 2 (two) hours as needed for moderate pain (or dyspnea).   oxyCODONE 5 MG immediate release tablet Commonly known as: Roxicodone Take 1 tablet (5 mg total) by mouth every 4 (four) hours as needed for severe pain.       No Known Allergies    The results of significant diagnostics from this hospitalization (including imaging, microbiology, ancillary and laboratory) are listed below for reference.    Significant Diagnostic Studies: DG Chest 2 View  Result Date: 05/26/2022 CLINICAL DATA:  Weakness, productive cough EXAM: CHEST - 2 VIEW COMPARISON:  CT 05/18/2022 FINDINGS: Unchanged cardiomediastinal silhouette.  There is no focal airspace consolidation. No pleural effusion or evidence of pneumothorax. Thoracic spondylosis. No acute osseous abnormality. There are scattered sclerotic lesions and small pulmonary nodules best seen on recent chest CT, compatible with known metastatic disease. IMPRESSION: No evidence of acute cardiopulmonary disease. Electronically Signed   By: Maurine Simmering M.D.   On: 05/26/2022 10:06   CT Head Wo Contrast  Result Date: 05/26/2022 CLINICAL DATA:  Mental status changes. EXAM: CT HEAD WITHOUT CONTRAST TECHNIQUE: Contiguous axial images were obtained from the base of the skull through the vertex without intravenous contrast. RADIATION DOSE REDUCTION: This exam was performed according to the departmental dose-optimization program which includes automated exposure control, adjustment of the mA and/or kV according to patient size and/or use of iterative reconstruction technique. COMPARISON:  MRI brain 05/20/2022. FINDINGS: Brain: There is no evidence for acute hemorrhage, hydrocephalus, mass lesion, or abnormal extra-axial fluid collection. No definite CT evidence for acute infarction. Diffuse loss of parenchymal volume is consistent with atrophy. Patchy low attenuation in the deep hemispheric and periventricular white matter is nonspecific, but likely reflects chronic microvascular ischemic demyelination. Vascular: No hyperdense vessel or unexpected calcification. Skull: No evidence for fracture. No worrisome lytic or sclerotic lesion. Sinuses/Orbits: The visualized paranasal sinuses and mastoid air cells are clear. Visualized portions of the globes and intraorbital fat are unremarkable. Other: None. IMPRESSION: 1. No acute intracranial abnormality. 2. Atrophy with chronic small vessel ischemic disease. Electronically Signed   By: Misty Stanley M.D.   On: 05/26/2022 09:55   MR Brain W Wo Contrast  Result Date: 05/20/2022 CLINICAL DATA:  Brain metastases, assess treatment response EXAM: MRI HEAD  WITHOUT AND WITH CONTRAST TECHNIQUE: Multiplanar, multiecho pulse sequences of the brain and surrounding structures were obtained without and with intravenous contrast. CONTRAST:  7.67m GADAVIST GADOBUTROL 1 MMOL/ML IV SOLN COMPARISON:  MRI head February 24, 2022. FINDINGS: Brain: Similar appearance of numerous treated metastases and/or chronic microhemorrhages. No progressive edema or significant enhancement to suggest progression. Most conspicuous treated lesion in the left parietal lobe appears similar. No substantial change in confluent T2/FLAIR hyperintensity within the white matter. No evidence of acute infarct, acute hemorrhage, midline shift, or hydrocephalus. Cerebral atrophy. Vascular: Major arterial flow voids are maintained at the skull base. Skull and upper cervical spine: Normal marrow signal. Sinuses/Orbits: No acute findings. IMPRESSION: Stable exam without evidence of recurrent/progressive disease. Electronically Signed   By: FMargaretha SheffieldM.D.   On: 05/20/2022 15:49   CT CHEST W CONTRAST  Result Date: 05/19/2022 CLINICAL DATA:  Non-small-cell lung cancer metastatic. Assess treatment response. History of right upper lobectomy. On daily chemotherapy and immunotherapy * Tracking Code: BO * EXAM: CT CHEST WITH CONTRAST TECHNIQUE: Multidetector CT imaging of the chest was performed during intravenous contrast administration. RADIATION DOSE REDUCTION: This exam was performed according to the departmental dose-optimization program  which includes automated exposure control, adjustment of the mA and/or kV according to patient size and/or use of iterative reconstruction technique. CONTRAST:  90m OMNIPAQUE IOHEXOL 300 MG/ML  SOLN COMPARISON:  CT 02/18/2022 and older FINDINGS: Cardiovascular: The thoracic aorta has a normal course and caliber with scattered vascular calcifications. Coronary artery calcifications are seen. Calcifications also seen along the aortic valve. The heart is nonenlarged. No  pericardial effusion. Mediastinum/Nodes: Small thyroid gland. No specific abnormal lymph node enlargement seen in the axillary region, hilum or mediastinum. Stable prominent right hilar lymph node measuring 8 x 17 mm on series 2, image 68. Normal caliber thoracic esophagus. Lungs/Pleura: Centrilobular emphysematous lung changes are once again identified. No consolidation, pneumothorax or effusion. Some mild areas of bronchiectasis. Previously there is an ill-defined 8 x 6 mm nodule in the middle lobe. This same lesion is seen today but appears less confluence and slightly smaller measuring 7 by 3 mm. Series 3, image 81. Small nodule just caudal to this which measured 4 mm on the prior stable today on series 3, image 90. Smaller areas of nodularity in the posterior right upper lobe on image 61 and 63 are also stable. Upper Abdomen: Left adrenal gland continues to be thickened and nodular, unchanged from prior. Stone in the gallbladder. Hepatic cysts. Musculoskeletal: Scattered areas of bony sclerotic changes are once again identified along the spine, right clavicle, sternum and ribs in a similar distribution compared to prior CT consistent with osseous metastatic disease. Separate scattered degenerative changes. IMPRESSION: 1. Multiple smaller nodules are again identified, similar to previous. The dominant nodule in the middle lobe on the prior, today is slightly smaller 2. Scattered bony sclerotic changes are once again identified consistent with osseous metastatic disease. 3. Cholelithiasis. 4. Stable adrenal nodularity on the left. Aortic Atherosclerosis (ICD10-I70.0) and Emphysema (ICD10-J43.9). Electronically Signed   By: AJill SideM.D.   On: 05/19/2022 12:39    Microbiology: Recent Results (from the past 240 hour(s))  Urine Culture     Status: None   Collection Time: 05/22/22  3:09 PM   Specimen: Urine, Clean Catch  Result Value Ref Range Status   Specimen Description   Final    URINE, CLEAN  CATCH [facility] [address], [place] [address]   Special Requests   Final    NONE [facility] [address], [place] [address]   Culture   Final    NO GROWTH [facility]formed at [facility] [address], [place] [id number]   Report Status 05/23/2022 FINAL  Final     Labs: Basic Metabolic Panel: Recent Labs  Lab 05/26/22 0913  NA 133*  K 3.9  CL 96*  CO2 30  GLUCOSE 127*  BUN 20  CREATININE 0.93  CALCIUM 9.7  MG 2.2   Liver Function Tests: Recent Labs  Lab 05/26/22 0913  AST 23  ALT 18  ALKPHOS 44  BILITOT 1.0  PROT 7.9  ALBUMIN 4.2   No results for input(s): "LIPASE", "AMYLASE" in the last 168 hours. No results for input(s): "AMMONIA" in the last 168 hours. CBC: Recent Labs  Lab 05/26/22 0913  WBC 14.8*  NEUTROABS 13.2*  HGB 15.2  HCT 46.2  MCV 95.9  PLT 191   Cardiac Enzymes: No results for input(s): "CKTOTAL", "CKMB", "CKMBINDEX", "TROPONINI" in the last 168 hours. BNP: BNP (last 3 results) No results for input(s): "BNP" in the last 8760 hours.  ProBNP (last 3 results) No results for input(s): "PROBNP" in the last 8760 hours.  CBG: No results for input(s): "GLUCAP" in the last 168 hours.     Signed:  Desma Maxim MD.  [facility] 05/27/2022, 2:53 PM

## 2022-05-27 NOTE — Telephone Encounter (Signed)
Checked on Curtis Andrade this afternoon.  Curtis Andrade resting comfortably sleeping status post.  Spoke to Curtis Andrade's daughter by the bedside-waiting for hospice discharge today.  NO charge. GB

## 2022-05-27 NOTE — Progress Notes (Signed)
Patient discharged to home, via EMS. Daughter and wife given discharge instructions and prescriptions. All belongings sent home with patient and peripheral IV removed.

## 2022-05-27 NOTE — TOC Transition Note (Signed)
Transition of Care [facility]) - CM/SW Discharge Note   Patient Details  Name: Curtis Andrade. MRN: [id number] Date of Birth: Aug 07, 1945  [facility] CM/SW Contact:  Gerilyn Pilgrim, LCSW Phone Number: 05/27/2022, 3:02 PM   Clinical Narrative:  Pt has orders to discharge home with home hospice, [facility]. DME has been delivered to patients home. [facility] called. CSW signing off.            Patient Goals and CMS Choice      Discharge Placement                         Discharge Plan and Services Additional resources added to the After Visit Summary for                                       Social Determinants of Health (SDOH) Interventions SDOH Screenings   Food Insecurity: No Food Insecurity (03/27/2022)  Housing: Low Risk  (03/27/2022)  Transportation Needs: No Transportation Needs (03/27/2022)  Utilities: Not At Risk (03/27/2022)  Alcohol Screen: Low Risk  (03/27/2022)  Depression (PHQ2-9): Low Risk  (03/27/2022)  Financial Resource Strain: Low Risk  (03/27/2022)  Physical Activity: Insufficiently Active (03/27/2022)  Social Connections: Socially Integrated (03/27/2022)  Stress: Stress Concern Present (03/27/2022)  Tobacco Use: Medium Risk (05/22/2022)     Readmission Risk Interventions     No data to display

## 2022-05-28 ENCOUNTER — Encounter: Payer: Self-pay | Admitting: Licensed Clinical Social Worker

## 2022-05-28 ENCOUNTER — Ambulatory Visit: Payer: PPO | Admitting: Urology

## 2022-05-28 NOTE — Progress Notes (Signed)
[facility] CSW Progress Note   Patient discharge from [facility] ED on 05-27-2022. Patient is now active with [facility].    Adelene Amas, LCSW

## 2022-05-29 LAB — THYROID PANEL
Free Thyroxine Index: 1.9 (ref 1.2–4.9)
T3 Uptake Ratio: 27 % (ref 24–39)
T4, Total: 6.9 ug/dL (ref 4.5–12.0)

## 2022-06-01 ENCOUNTER — Telehealth: Payer: Self-pay | Admitting: Internal Medicine

## 2022-06-01 NOTE — Telephone Encounter (Signed)
I called patient's wife and offered my condolences.  Family very thankful for the call.   Appreciate the cancer center staff for the care and support provided to the patient.   Wife states that she will bring in "left over oral chemo medication"  FYI-Ally[place].   GB

## 2022-06-02 ENCOUNTER — Inpatient Hospital Stay: Payer: HMO

## 2022-06-02 ENCOUNTER — Telehealth: Payer: Self-pay

## 2022-06-02 ENCOUNTER — Inpatient Hospital Stay: Payer: HMO | Admitting: Internal Medicine

## 2022-06-02 NOTE — Telephone Encounter (Signed)
Received notification that patient would no longer need Braftovi + Mektovi. I called [employer] Patient Assistance at [phone] and cancelled enrollment.    Berdine Addison, [facility] Patient [place]  [phone] (phone) [phone] (fax) 06/02/2022 9:45 AM

## 2022-06-02 NOTE — Telephone Encounter (Signed)
Contacted Mrs. Schlicker to make sure she knew what to do with his left over medication.

## 2022-06-26 ENCOUNTER — Ambulatory Visit: Payer: PPO | Admitting: Internal Medicine

## 2022-06-29 DEATH — deceased

## 2022-07-09 ENCOUNTER — Other Ambulatory Visit: Payer: PPO

## 2022-07-17 ENCOUNTER — Ambulatory Visit: Payer: PPO | Admitting: Internal Medicine

## 2022-07-24 ENCOUNTER — Ambulatory Visit: Payer: Self-pay | Admitting: Internal Medicine
